# Patient Record
Sex: Female | Born: 1945 | Race: Black or African American | Hispanic: No | Smoking: Former smoker
Health system: Southern US, Community
[De-identification: ages and names within clinical notes are randomized; demographics above are authoritative.]

## PROBLEM LIST (undated history)

## (undated) DIAGNOSIS — T82868A Thrombosis of vascular prosthetic devices, implants and grafts, initial encounter: Secondary | ICD-10-CM

## (undated) DIAGNOSIS — M810 Age-related osteoporosis without current pathological fracture: Secondary | ICD-10-CM

## (undated) DIAGNOSIS — I779 Disorder of arteries and arterioles, unspecified: Secondary | ICD-10-CM

## (undated) DIAGNOSIS — F1011 Alcohol abuse, in remission: Secondary | ICD-10-CM

## (undated) DIAGNOSIS — M6740 Ganglion, unspecified site: Secondary | ICD-10-CM

## (undated) DIAGNOSIS — D3000 Benign neoplasm of unspecified kidney: Secondary | ICD-10-CM

## (undated) DIAGNOSIS — J449 Chronic obstructive pulmonary disease, unspecified: Secondary | ICD-10-CM

## (undated) DIAGNOSIS — J302 Other seasonal allergic rhinitis: Secondary | ICD-10-CM

## (undated) DIAGNOSIS — Z205 Contact with and (suspected) exposure to viral hepatitis: Secondary | ICD-10-CM

## (undated) DIAGNOSIS — I872 Venous insufficiency (chronic) (peripheral): Secondary | ICD-10-CM

## (undated) DIAGNOSIS — K5790 Diverticulosis of intestine, part unspecified, without perforation or abscess without bleeding: Secondary | ICD-10-CM

## (undated) DIAGNOSIS — F1290 Cannabis use, unspecified, uncomplicated: Secondary | ICD-10-CM

## (undated) DIAGNOSIS — I509 Heart failure, unspecified: Secondary | ICD-10-CM

## (undated) DIAGNOSIS — Z720 Tobacco use: Secondary | ICD-10-CM

## (undated) DIAGNOSIS — F331 Major depressive disorder, recurrent, moderate: Secondary | ICD-10-CM

## (undated) DIAGNOSIS — K648 Other hemorrhoids: Secondary | ICD-10-CM

## (undated) DIAGNOSIS — E441 Mild protein-calorie malnutrition: Secondary | ICD-10-CM

## (undated) DIAGNOSIS — M62838 Other muscle spasm: Secondary | ICD-10-CM

## (undated) DIAGNOSIS — I10 Essential (primary) hypertension: Secondary | ICD-10-CM

## (undated) HISTORY — DX: Tobacco use: Z72.0

## (undated) HISTORY — DX: Thrombosis due to vascular prosthetic devices, implants and grafts, initial encounter: T82.868A

## (undated) HISTORY — DX: Mild protein-calorie malnutrition: E44.1

## (undated) HISTORY — DX: Diverticulosis of intestine, part unspecified, without perforation or abscess without bleeding: K57.90

## (undated) HISTORY — DX: Contact with and (suspected) exposure to viral hepatitis: Z20.5

## (undated) HISTORY — DX: Alcohol abuse, in remission: F10.11

## (undated) HISTORY — DX: Benign neoplasm of unspecified kidney: D30.00

## (undated) HISTORY — DX: Disorder of arteries and arterioles, unspecified: I77.9

## (undated) HISTORY — DX: Ganglion, unspecified site: M67.40

## (undated) HISTORY — DX: Venous insufficiency (chronic) (peripheral): I87.2

## (undated) HISTORY — DX: Chronic obstructive pulmonary disease, unspecified: J44.9

## (undated) HISTORY — DX: Other seasonal allergic rhinitis: J30.2

## (undated) HISTORY — DX: Other hemorrhoids: K64.8

## (undated) HISTORY — DX: Major depressive disorder, recurrent, moderate: F33.1

## (undated) HISTORY — DX: Other muscle spasm: M62.838

## (undated) HISTORY — DX: Cannabis use, unspecified, uncomplicated: F12.90

## (undated) HISTORY — DX: Essential (primary) hypertension: I10

## (undated) HISTORY — DX: Age-related osteoporosis without current pathological fracture: M81.0

## (undated) HISTORY — PX: ABDOMINAL HYSTERECTOMY: SHX81

---

## 1998-11-27 ENCOUNTER — Encounter: Admission: RE | Admit: 1998-11-27 | Discharge: 1998-11-27 | Payer: Self-pay | Admitting: Family Medicine

## 1998-11-27 ENCOUNTER — Ambulatory Visit (HOSPITAL_COMMUNITY): Admission: RE | Admit: 1998-11-27 | Discharge: 1998-11-27 | Payer: Self-pay | Admitting: Family Medicine

## 1999-04-14 ENCOUNTER — Inpatient Hospital Stay (HOSPITAL_COMMUNITY): Admission: EM | Admit: 1999-04-14 | Discharge: 1999-04-22 | Payer: Self-pay | Admitting: Emergency Medicine

## 1999-04-14 ENCOUNTER — Encounter: Payer: Self-pay | Admitting: Emergency Medicine

## 1999-04-15 ENCOUNTER — Encounter: Payer: Self-pay | Admitting: General Surgery

## 1999-04-16 ENCOUNTER — Encounter: Payer: Self-pay | Admitting: General Surgery

## 1999-04-17 ENCOUNTER — Encounter: Payer: Self-pay | Admitting: Emergency Medicine

## 1999-04-17 ENCOUNTER — Encounter: Payer: Self-pay | Admitting: General Surgery

## 1999-04-21 ENCOUNTER — Encounter: Payer: Self-pay | Admitting: General Surgery

## 1999-05-21 ENCOUNTER — Encounter: Admission: RE | Admit: 1999-05-21 | Discharge: 1999-05-21 | Payer: Self-pay | Admitting: Family Medicine

## 2001-02-25 ENCOUNTER — Emergency Department (HOSPITAL_COMMUNITY): Admission: EM | Admit: 2001-02-25 | Discharge: 2001-02-25 | Payer: Self-pay | Admitting: *Deleted

## 2002-09-07 ENCOUNTER — Encounter: Admission: RE | Admit: 2002-09-07 | Discharge: 2002-09-07 | Payer: Self-pay | Admitting: Internal Medicine

## 2002-09-14 ENCOUNTER — Encounter: Admission: RE | Admit: 2002-09-14 | Discharge: 2002-09-14 | Payer: Self-pay | Admitting: Internal Medicine

## 2002-10-16 ENCOUNTER — Encounter: Admission: RE | Admit: 2002-10-16 | Discharge: 2002-10-16 | Payer: Self-pay | Admitting: Internal Medicine

## 2003-02-12 ENCOUNTER — Encounter: Admission: RE | Admit: 2003-02-12 | Discharge: 2003-02-12 | Payer: Self-pay | Admitting: Internal Medicine

## 2003-12-02 ENCOUNTER — Emergency Department (HOSPITAL_COMMUNITY): Admission: EM | Admit: 2003-12-02 | Discharge: 2003-12-02 | Payer: Self-pay | Admitting: Emergency Medicine

## 2003-12-13 ENCOUNTER — Encounter: Admission: RE | Admit: 2003-12-13 | Discharge: 2003-12-13 | Payer: Self-pay | Admitting: Internal Medicine

## 2003-12-20 ENCOUNTER — Encounter: Admission: RE | Admit: 2003-12-20 | Discharge: 2003-12-20 | Payer: Self-pay | Admitting: Internal Medicine

## 2004-06-03 ENCOUNTER — Encounter: Admission: RE | Admit: 2004-06-03 | Discharge: 2004-06-03 | Payer: Self-pay | Admitting: Internal Medicine

## 2004-06-11 ENCOUNTER — Ambulatory Visit (HOSPITAL_COMMUNITY): Admission: RE | Admit: 2004-06-11 | Discharge: 2004-06-11 | Payer: Self-pay | Admitting: Internal Medicine

## 2004-06-11 ENCOUNTER — Encounter: Admission: RE | Admit: 2004-06-11 | Discharge: 2004-06-11 | Payer: Self-pay | Admitting: Internal Medicine

## 2004-06-12 ENCOUNTER — Encounter: Admission: RE | Admit: 2004-06-12 | Discharge: 2004-06-12 | Payer: Self-pay | Admitting: Internal Medicine

## 2004-07-03 ENCOUNTER — Encounter: Admission: RE | Admit: 2004-07-03 | Discharge: 2004-07-03 | Payer: Self-pay | Admitting: Internal Medicine

## 2004-09-23 ENCOUNTER — Ambulatory Visit: Payer: Self-pay | Admitting: Hematology & Oncology

## 2004-11-17 ENCOUNTER — Ambulatory Visit (HOSPITAL_COMMUNITY): Admission: RE | Admit: 2004-11-17 | Discharge: 2004-11-17 | Payer: Self-pay | Admitting: Internal Medicine

## 2004-11-17 ENCOUNTER — Ambulatory Visit: Payer: Self-pay | Admitting: Internal Medicine

## 2004-12-07 ENCOUNTER — Ambulatory Visit: Payer: Self-pay | Admitting: Internal Medicine

## 2005-01-07 ENCOUNTER — Ambulatory Visit: Payer: Self-pay | Admitting: Internal Medicine

## 2005-01-30 ENCOUNTER — Encounter (INDEPENDENT_AMBULATORY_CARE_PROVIDER_SITE_OTHER): Payer: Self-pay | Admitting: *Deleted

## 2005-01-30 ENCOUNTER — Emergency Department (HOSPITAL_COMMUNITY): Admission: EM | Admit: 2005-01-30 | Discharge: 2005-01-30 | Payer: Self-pay | Admitting: Emergency Medicine

## 2005-02-02 ENCOUNTER — Emergency Department (HOSPITAL_COMMUNITY): Admission: EM | Admit: 2005-02-02 | Discharge: 2005-02-02 | Payer: Self-pay | Admitting: Emergency Medicine

## 2005-02-06 ENCOUNTER — Emergency Department (HOSPITAL_COMMUNITY): Admission: EM | Admit: 2005-02-06 | Discharge: 2005-02-06 | Payer: Self-pay | Admitting: Family Medicine

## 2005-02-10 ENCOUNTER — Emergency Department (HOSPITAL_COMMUNITY): Admission: EM | Admit: 2005-02-10 | Discharge: 2005-02-10 | Payer: Self-pay | Admitting: Family Medicine

## 2005-09-10 ENCOUNTER — Ambulatory Visit: Payer: Self-pay | Admitting: Internal Medicine

## 2005-09-17 ENCOUNTER — Ambulatory Visit: Payer: Self-pay | Admitting: Internal Medicine

## 2005-10-27 ENCOUNTER — Ambulatory Visit: Payer: Self-pay | Admitting: Internal Medicine

## 2005-11-01 ENCOUNTER — Ambulatory Visit (HOSPITAL_COMMUNITY): Admission: RE | Admit: 2005-11-01 | Discharge: 2005-11-01 | Payer: Self-pay | Admitting: *Deleted

## 2005-11-02 ENCOUNTER — Encounter: Admission: RE | Admit: 2005-11-02 | Discharge: 2005-12-03 | Payer: Self-pay | Admitting: *Deleted

## 2006-09-28 DIAGNOSIS — F331 Major depressive disorder, recurrent, moderate: Secondary | ICD-10-CM | POA: Insufficient documentation

## 2006-09-28 DIAGNOSIS — Z720 Tobacco use: Secondary | ICD-10-CM

## 2006-09-28 DIAGNOSIS — I10 Essential (primary) hypertension: Secondary | ICD-10-CM

## 2006-09-28 HISTORY — DX: Major depressive disorder, recurrent, moderate: F33.1

## 2006-09-28 HISTORY — DX: Tobacco use: Z72.0

## 2006-09-28 HISTORY — DX: Essential (primary) hypertension: I10

## 2006-10-15 DIAGNOSIS — M6740 Ganglion, unspecified site: Secondary | ICD-10-CM

## 2006-10-15 HISTORY — DX: Ganglion, unspecified site: M67.40

## 2006-10-26 ENCOUNTER — Encounter (INDEPENDENT_AMBULATORY_CARE_PROVIDER_SITE_OTHER): Payer: Self-pay | Admitting: *Deleted

## 2006-10-26 ENCOUNTER — Ambulatory Visit: Payer: Self-pay | Admitting: *Deleted

## 2006-10-26 LAB — CONVERTED CEMR LAB
ALT: <8 units/L (ref 0–35)
AST: 13 units/L (ref 0–37)
Albumin: 4 g/dL (ref 3.5–5.2)
CO2: 31 meq/L (ref 19–32)
Calcium: 9 mg/dL (ref 8.4–10.5)
Chloride: 101 meq/L (ref 96–112)
Cholesterol: 174 mg/dL (ref 0–200)
Potassium: 4.3 meq/L (ref 3.5–5.3)
Sodium: 137 meq/L (ref 135–145)
Total CHOL/HDL Ratio: 3.1
Total Protein: 7.1 g/dL (ref 6.0–8.3)

## 2007-01-16 ENCOUNTER — Telehealth: Payer: Self-pay | Admitting: *Deleted

## 2007-02-28 ENCOUNTER — Telehealth (INDEPENDENT_AMBULATORY_CARE_PROVIDER_SITE_OTHER): Payer: Self-pay | Admitting: *Deleted

## 2007-08-03 ENCOUNTER — Emergency Department (HOSPITAL_COMMUNITY): Admission: EM | Admit: 2007-08-03 | Discharge: 2007-08-03 | Payer: Self-pay | Admitting: Emergency Medicine

## 2007-09-12 ENCOUNTER — Ambulatory Visit: Payer: Self-pay | Admitting: Internal Medicine

## 2007-09-12 ENCOUNTER — Encounter (INDEPENDENT_AMBULATORY_CARE_PROVIDER_SITE_OTHER): Payer: Self-pay | Admitting: *Deleted

## 2007-09-12 LAB — CONVERTED CEMR LAB
Bilirubin Urine: NEGATIVE
Creatinine, Urine: 231.7 mg/dL
Microalb Creat Ratio: 4.6 mg/g (ref 0.0–30.0)
Microalb, Ur: 1.06 mg/dL (ref 0.00–1.89)
Nitrite: NEGATIVE
Protein, ur: NEGATIVE mg/dL
Specific Gravity, Urine: 1.025 (ref 1.005–1.03)
Urobilinogen, UA: 1 (ref 0.0–1.0)

## 2007-09-15 ENCOUNTER — Ambulatory Visit (HOSPITAL_COMMUNITY): Admission: RE | Admit: 2007-09-15 | Discharge: 2007-09-15 | Payer: Self-pay | Admitting: *Deleted

## 2007-10-17 ENCOUNTER — Ambulatory Visit (HOSPITAL_COMMUNITY): Admission: RE | Admit: 2007-10-17 | Discharge: 2007-10-17 | Payer: Self-pay | Admitting: *Deleted

## 2007-10-25 ENCOUNTER — Emergency Department (HOSPITAL_COMMUNITY): Admission: EM | Admit: 2007-10-25 | Discharge: 2007-10-25 | Payer: Self-pay | Admitting: Emergency Medicine

## 2007-10-25 ENCOUNTER — Encounter: Admission: RE | Admit: 2007-10-25 | Discharge: 2007-10-25 | Payer: Self-pay | Admitting: *Deleted

## 2008-01-24 ENCOUNTER — Emergency Department (HOSPITAL_COMMUNITY): Admission: EM | Admit: 2008-01-24 | Discharge: 2008-01-24 | Payer: Self-pay | Admitting: Emergency Medicine

## 2008-06-22 ENCOUNTER — Emergency Department (HOSPITAL_COMMUNITY): Admission: EM | Admit: 2008-06-22 | Discharge: 2008-06-22 | Payer: Self-pay | Admitting: Emergency Medicine

## 2008-07-12 ENCOUNTER — Ambulatory Visit: Payer: Self-pay | Admitting: Internal Medicine

## 2008-07-15 ENCOUNTER — Ambulatory Visit (HOSPITAL_COMMUNITY): Admission: RE | Admit: 2008-07-15 | Discharge: 2008-07-15 | Payer: Self-pay | Admitting: *Deleted

## 2008-07-15 DIAGNOSIS — J449 Chronic obstructive pulmonary disease, unspecified: Secondary | ICD-10-CM

## 2008-07-15 HISTORY — DX: Chronic obstructive pulmonary disease, unspecified: J44.9

## 2008-07-24 ENCOUNTER — Encounter: Payer: Self-pay | Admitting: Internal Medicine

## 2008-08-06 ENCOUNTER — Telehealth (INDEPENDENT_AMBULATORY_CARE_PROVIDER_SITE_OTHER): Payer: Self-pay | Admitting: Internal Medicine

## 2009-04-30 ENCOUNTER — Emergency Department (HOSPITAL_COMMUNITY): Admission: EM | Admit: 2009-04-30 | Discharge: 2009-04-30 | Payer: Self-pay | Admitting: Emergency Medicine

## 2009-07-17 ENCOUNTER — Ambulatory Visit: Payer: Self-pay | Admitting: Internal Medicine

## 2009-07-17 DIAGNOSIS — R05 Cough: Secondary | ICD-10-CM

## 2009-07-17 DIAGNOSIS — R059 Cough, unspecified: Secondary | ICD-10-CM | POA: Insufficient documentation

## 2009-07-25 ENCOUNTER — Encounter: Payer: Self-pay | Admitting: Internal Medicine

## 2009-07-25 ENCOUNTER — Ambulatory Visit: Payer: Self-pay | Admitting: Infectious Diseases

## 2009-07-25 LAB — CONVERTED CEMR LAB
ALT: 9 units/L (ref 0–35)
Albumin: 4.3 g/dL (ref 3.5–5.2)
Alkaline Phosphatase: 61 units/L (ref 39–117)
CO2: 25 meq/L (ref 19–32)
Cholesterol: 195 mg/dL (ref 0–200)
LDL Cholesterol: 107 mg/dL — ABNORMAL HIGH (ref 0–99)
MCV: 90.9 fL (ref >=78.0)
Platelets: 228 10*3/uL (ref 150–400)
Potassium: 4.5 meq/L (ref 3.5–5.3)
Sodium: 140 meq/L (ref 135–145)
Total Bilirubin: 0.2 mg/dL — ABNORMAL LOW (ref 0.3–1.2)
Total Protein: 7.2 g/dL (ref 6.0–8.3)
VLDL: 28 mg/dL (ref 0–40)
WBC: 4.6 10*3/uL (ref 4.0–10.5)

## 2009-08-11 ENCOUNTER — Telehealth: Payer: Self-pay | Admitting: Internal Medicine

## 2009-09-23 ENCOUNTER — Telehealth: Payer: Self-pay | Admitting: Internal Medicine

## 2009-11-13 ENCOUNTER — Ambulatory Visit (HOSPITAL_COMMUNITY): Admission: RE | Admit: 2009-11-13 | Discharge: 2009-11-13 | Payer: Self-pay | Admitting: Internal Medicine

## 2010-08-15 ENCOUNTER — Emergency Department (HOSPITAL_COMMUNITY): Admission: EM | Admit: 2010-08-15 | Discharge: 2010-08-15 | Payer: Self-pay | Admitting: Emergency Medicine

## 2010-08-20 ENCOUNTER — Ambulatory Visit: Payer: Self-pay | Admitting: Internal Medicine

## 2010-09-18 ENCOUNTER — Telehealth: Payer: Self-pay | Admitting: Internal Medicine

## 2010-10-21 ENCOUNTER — Telehealth: Payer: Self-pay | Admitting: Internal Medicine

## 2010-10-28 ENCOUNTER — Telehealth: Payer: Self-pay | Admitting: Internal Medicine

## 2010-11-17 ENCOUNTER — Ambulatory Visit: Admit: 2010-11-17 | Payer: Self-pay

## 2010-11-19 ENCOUNTER — Ambulatory Visit (HOSPITAL_COMMUNITY)
Admission: RE | Admit: 2010-11-19 | Discharge: 2010-11-19 | Payer: Self-pay | Source: Home / Self Care | Attending: Internal Medicine | Admitting: Internal Medicine

## 2010-11-19 LAB — HM MAMMOGRAPHY: HM Mammogram: NEGATIVE

## 2010-12-07 ENCOUNTER — Observation Stay (HOSPITAL_COMMUNITY)
Admission: EM | Admit: 2010-12-07 | Discharge: 2010-12-13 | Payer: Self-pay | Source: Home / Self Care | Attending: Vascular Surgery | Admitting: Vascular Surgery

## 2010-12-08 LAB — POCT I-STAT, CHEM 8
BUN: 19 mg/dL (ref 6–23)
Creatinine, Ser: 1 mg/dL (ref 0.4–1.2)
Potassium: 3.4 mEq/L — ABNORMAL LOW (ref 3.5–5.1)
Sodium: 138 mEq/L (ref 135–145)

## 2010-12-08 LAB — CBC
Hemoglobin: 12.9 g/dL (ref 12.0–15.0)
MCH: 27.9 pg (ref 26.0–34.0)
RBC: 4.63 MIL/uL (ref 3.87–5.11)

## 2010-12-09 HISTORY — PX: FEMORAL ARTERY - POPLITEAL ARTERY BYPASS GRAFT: SUR180

## 2010-12-09 LAB — CBC
Platelets: 236 10*3/uL (ref 150–400)
RBC: 4.28 MIL/uL (ref 3.87–5.11)
RDW: 13.5 % (ref 11.5–15.5)
WBC: 3.8 10*3/uL — ABNORMAL LOW (ref 4.0–10.5)

## 2010-12-09 LAB — BASIC METABOLIC PANEL
BUN: 5 mg/dL — ABNORMAL LOW (ref 6–23)
Chloride: 100 mEq/L (ref 96–112)
Creatinine, Ser: 0.9 mg/dL (ref 0.4–1.2)
GFR calc Af Amer: >60 mL/min (ref >60)
GFR calc non Af Amer: >60 mL/min (ref >60)
Potassium: 3.8 mEq/L (ref 3.5–5.1)

## 2010-12-09 LAB — MRSA PCR SCREENING: MRSA by PCR: NEGATIVE

## 2010-12-10 LAB — BASIC METABOLIC PANEL
Calcium: 8.7 mg/dL (ref 8.4–10.5)
GFR calc Af Amer: >60 mL/min (ref >60)
GFR calc non Af Amer: >60 mL/min (ref >60)
Glucose, Bld: 126 mg/dL — ABNORMAL HIGH (ref 70–99)
Potassium: 3.8 mEq/L (ref 3.5–5.1)
Sodium: 139 mEq/L (ref 135–145)

## 2010-12-10 LAB — CBC
HCT: 32.5 % — ABNORMAL LOW (ref 36.0–46.0)
Hemoglobin: 10.6 g/dL — ABNORMAL LOW (ref 12.0–15.0)
MCHC: 32.6 g/dL (ref 30.0–36.0)
RDW: 13.1 % (ref 11.5–15.5)
WBC: 8.2 10*3/uL (ref 4.0–10.5)

## 2010-12-11 NOTE — H&P (Signed)
  NAMEDEZTINEE, LOHMEYER NO.:  [id number]  MEDICAL RECORD NO.:  [id number]          PATIENT TYPE:  OBV  LOCATION:  2031                         FACILITY:  [facility]  PHYSICIAN:  Larina Earthly, M.D.    DATE OF BIRTH:  Mar 17, 1946  DATE OF ADMISSION:  12/07/2010 DATE OF DISCHARGE:                             HISTORY & PHYSICAL   ADMISSION DIAGNOSIS:  Rest ischemia of left foot.  HISTORY OF PRESENT ILLNESS:  Kristin Knight is a 65 year old black female who reports a 3-week history of progressive worsening rest pain in her left foot.  On questioning, she did have a long history of left calf claudication and that was moderate to severe, but over the past 3 weeks he has had progressive ischemia and now presents to the emergency department on this day due to intolerable pain.  She does not have any prior history of cardiac disease.  No other history of peripheral vascular occlusive disease.  No history of stroke.  Her past medicalhistory is significant for hypertension and depression for which she is on treatment.  She has allergies to DEMEROL and PENICILLIN, which cause syncope.  MEDICATIONS ON ADMISSION: 1. Lisinopril 10 mg daily. 2. Nortriptyline 50 mg at bedtime. 3. Hydrochlorothiazide and triamterene 37.5/25 one daily.  SOCIAL HISTORY:  Patient is a current cigarette smoker.  Does not drink alcohol.  FAMILY HISTORY:  Significant for coronary artery disease.  PAST SURGICAL HISTORY:  None.  PHYSICAL EXAM:  GENERAL:  A well-developed, well-nourished black female, appearing stated age, in no acute distress.  She is in no acute distress. VITAL SIGNS:  Blood pressure is 138/95, heart rate 81, respirations 18, temperature is 97.5. HEENT:  Normal. CHEST:  Clear bilaterally without rales, rhonchi, or wheezes. HEART:  Regular rate and rhythm without murmur. ABDOMEN:  Soft, nontender.  No masses noted. EXTREMITIES:  Without cyanosis or edema or any deformities. SKIN:   Without ulcers or rashes.  Pulse status is 2+ radial, 2+ femoral. She has a 2+ right popliteal and 1+ right posterior tibial.  She has absent left popliteal and left distal pulses.  Review of systems is otherwise negative except for HPI.  She underwent noninvasive vascular laboratory studies revealing an ankle arm index of 0.34 on the left and normal at 1.0 on the right.  Recommended that she undergo arteriography for further evaluation of her ischemia. Neurologic is noted for motor and sensory attack in the lower extremities and no other focal weakness.  She did undergo arteriogram and this did reveal occlusion of her superficial femoral artery reconstitution above the knee popliteal artery.  Discussed this with Ms. Goshorn explained that due to her rest pain, she will require femoral to popliteal bypass for relief of this. She will be admitted today for a vein map on tomorrow Tuesday, January 24 and tentatively plan for surgery on January 25 with Dr. Leonides Sake.     Larina Earthly, M.D.     TFE/MEDQ  D:  12/07/2010  T:  12/08/2010  Job:  [id number]  Electronically Signed by Jewett Mcgann M.D. on 12/11/2010 03:37:34 PM

## 2010-12-12 LAB — CBC
Platelets: 175 10*3/uL (ref 150–400)
RBC: 3.68 MIL/uL — ABNORMAL LOW (ref 3.87–5.11)
WBC: 5.8 10*3/uL (ref 4.0–10.5)

## 2010-12-13 LAB — CROSSMATCH
ABO/RH(D): B POS
Antibody Screen: NEGATIVE
Unit division: 0

## 2010-12-13 NOTE — Op Note (Addendum)
NAME:  Kristin Knight, MASK NO.:  [id number]  MEDICAL RECORD NO.:  [id number]           PATIENT TYPE:  LOCATION:                                 FACILITY:  PHYSICIAN:  Fransisco Hertz, MD       DATE OF BIRTH:  20-Mar-1946  DATE OF PROCEDURE:  12/09/2010 DATE OF DISCHARGE:                              OPERATIVE REPORT   PROCEDURE:  Left common femoral artery to above-knee popliteal bypass with a Propaten graft.  PREOPERATIVE DIAGNOSIS:  Left leg rest pain.  POSTOPERATIVE DIAGNOSIS:  Left leg rest pain.  SURGEON:  Arlys John L. Imogene Burn, MD  ANESTHESIA:  General endotracheal.  ESTIMATED BLOOD LOSS:  200 mL.  FLUIDS:  Crystalloid 2500 mL.  URINE OUTPUT:  700 mL.  FINDINGS: included a dopplerable left posterior tibial at the end of the case.  SPECIMENS: none  INDICATIONS:  This is a 65 year old female that presented to ER with about a 3-week history of rest pain.  She was brought back to the angio suite and a diagnostic angiogram demonstrated a left superficial femoral artery occlusion.  Her distal vessels were patent and too small to be used as a bypass target.  Additionally, the popliteal artery while small appeared to be patent.  Based on the patient's findings of rest pain, it was felt that she would need a bypass to improve her blood flow with an ABI less than 0.4.  She underwent vein mapping which failed to demonstrate a venous conduit large enough for use as a bypass conduit, so the plan was to proceed forward with a bypass from the common femoral artery on the left side down to the above-knee popliteal segment.  I discussed with the patient the risks, benefits, and alternatives of this operation and specific risk we discussed included but were not limited to bleeding, infection, possible nerve damage, possible myocardial infarction, possible stroke, possible need for additional procedures.  DESCRIPTION OF THE OPERATION:  After full informed written consent  was obtained from the patient, she was brought back to the operating room, placed supine upon the operating table.  Prior to induction, she had received IV antibiotics.  After obtaining adequate anesthesia, she was then prepped and draped in standard fashion for a left leg bypass procedure.  I had turned my attention first to her left groin.  There was a palpable femoral pulse.  I made a longitudinal incision over this artery and then using blunt dissection and electrocautery developed a plane down to the artery.  This was dissected out and I was able to dissect out the proximal superficial femoral artery up to the level of the inguinal ligament and dissected distally finding the superficial femoral artery and profunda arteries.  All arteries were controlled with vessel loops. I then removed retraction and packed this wound with wet Ray-Tec and turned my attention to the distal thigh medially.  I made an incision over Hunter canal using blunt dissection and electrocautery.  I was able to dissect down through this fascia, eventually I was able to dissect out the distal superficial femoral artery.  I took care to avoid  injury to nerves, one crossing a segment in the distal aspect of the superficial femoral artery had to be tied off and transected as it was over the target portion of the superficial femoral artery for the bypass.  Other than this, we were able to successfully mobilize the superficial femoral artery without any injury to adjacent structures.  The proximal aspect of this distal superficial femoral artery was extremely calcified and I did not feel a pulse; however, distally at roughly the level where the superficial femoral artery went underneath the femur there was a palpable pulse at this level and then I verified this with continuous Doppler.  There was a monophasic signal within this artery.  This was dissected out, and I placed vessel loops around this segment of  the artery.  I also at this point using a metal tunneler dissected a subsartorial tunnel up to the groin and then via this metal tunnel.  I placed a 6-mm Propaten graft with external rings, this was delivered through the metal tunnel and then the tunnel was removed.  At this point, I gave the patient a therapeutic bolus of heparin which was a total of 5000 units intravenously.  After waiting 3 minutes, then I turned my attention to the left groin.  I clamped the external iliac artery and the profunda and superficial femoral artery.  I made an arteriotomy in the common femoral artery, extended it with Potts scissor for total length of about a 7-mm arteriotomy.  I then spatulated one end of the graft to meet the dimensions.  This was sewn in with a running stitch of 5-0 Prolene in an end to side configuration.  Prior to completing this anastomosis, I backbled this profunda and the superficial femoral artery and also allowed the common femoral artery to bleed in antegrade fashion.  There was good pulsatile bleeding from all arteries.  I completed the anastomosis in usual fashion, and applied thrombin and Gelfoam to all the needle holes and released the retraction and then turned my attention to the distal incision.  Note that there was good pulsatile bleeding from the graft at this point, so I clamped the graft near its inflow anastomosis and then I pulled this graft to appropriate length. I then sucked out the blood from the graft and instilled it with heparinized saline to fully flush any residual blood out of this graft and then clamped it after injecting some more heparinized saline within the graft.  At this point, I looked at my distal superficial femoral artery and felt that some additional distal dissection was necessary.  I took down some of the fascial attachments of the Hunter canal, and at this point we were in the above-knee popliteal segment and I was able to feel a soft  segment in this above-knee popliteal segment.  At this point, I put the artery under tension using the vessel loops and then made arteriotomy then extended it with Potts scissor to 6 mm.  I allowed the  artery to bleed in antegrade and retrograde fashion.  There was actually good retrograde bleeding and minimal antegrade bleeding.  I flushed out this artery proximally and distally.  At this point then, I stretched the graft to appropriate length again and cut it to appropriate length taking off the external rings as necessary and then spatulated this graft for this anastomosis.  The graft was sewn to the artery using a running stitch of 6-0 Prolene in a end to side configuration.  Prior to  completing this anastomosis,  I allowed the popliteal artery to back bleed from both ends and then I allowed the graft to bleed for about 5 seconds to fully flush out any clot or any air in the graft and then I injected into this anastomosis some heparinized saline.  The anastomosis was completed in usual fashion.  I then released all clamps and vessel loops and then immediately there was a strong pulse in the above-knee popliteal segment.  I applied thrombin and Gelfoam to this anastomosis.  At this point, using continuous Doppler unit, I interrogated the above-knee popliteal artery.  There was a strong multiphasic signal within it and then interrogated distally the posterior tibial.  There was a strong signal in this artery that was stronger than before.  I was not able, however, to get a strong anterior tibial or dorsalis pedis signal.  This is consistent with preoperative findings on the angiogram, so I was not surprised with this.  At this point, I turned my attention to the left groin.  I irrigated it out with normal saline.  There was no more active bleeding at this point.  This groin was repaired with a double layer of 2-0 Vicryl and then a double layer of 3-0 Vicryl.  The skin was then  reapproximated with running subcuticular 3-0 Monocryl and then the skin was cleaned, dried, and reinforced with Dermabond.  I then turned my attention to the above knee incision, and irrigated it out with normal saline.  There was no more active bleeding.  The muscle was reapproximated over the graft using a running stitch of 2-0 Vicryl and then the subcutaneous tissue was reapproximated with a layer of 3-0 Vicryl, and then the skin was reapproximated with a running subcuticular of 4-0 Monocryl.  The skin was then cleaned, dried, and reinforced with Dermabond.  At this point, the patient was allowed to awaken without any difficulties with the plan to admit to step-down unit overnight.  COMPLICATIONS:  None.  CONDITION:  Stable.     Fransisco Hertz, MD     BLC/MEDQ  D:  12/09/2010  T:  12/10/2010  Job:  [id number]  Electronically Signed by Leonides Sake MD on 12/13/2010 03:50:11 PM

## 2010-12-13 NOTE — Op Note (Addendum)
NAME:  CHEZNEY, HUETHER NO.:  [id number]  MEDICAL RECORD NO.:  [id number]          PATIENT TYPE:  OBV  LOCATION:  2031                         FACILITY:  [facility]  PHYSICIAN:  Fransisco Hertz, MD       DATE OF BIRTH:  [birth year]  DATE OF PROCEDURE:  12/07/2010 DATE OF DISCHARGE:                              OPERATIVE REPORT   PROCEDURE: 1. Right common femoral artery cannulation under ultrasound guidance. 2. Aortogram. 3. Bilateral leg runoff.  PREOPERATIVE DIAGNOSIS:  Left rest pain.  POSTOP DIAGNOSIS:  Left rest pain.  SURGEON:  Fransisco Hertz, MD  Contrast 100 mL.  ANESTHESIA:  Conscious sedation.  FINDINGS: 1. Patent aorta. 2. Patent bilateral renal artery with nephrograms evident. 3. Patent bilateral common external and internal iliac arteries both     internal iliac arteries are diseased distally. 4. Bilateral common femoral artery and profunda arteries are widely     patent. 5. Right superficial femoral artery has multiple stenoses that are     less than 25%. 6. Left superficial femoral artery is occluded in the mid segment with     reconstitution of distal SFA which is about a little bit over 3 mm     in diameter. 7. Patent bilateral popliteal trifurcation with patent anterior tibial and     posterior tibial artery. 8. Bilateral peroneal arteries are not visualized about mid lower leg     level. 9. Bilateral feet have runoff via posterior tibial, anterior tibial     arteries. 10.All of the tibial arteries visualized were less than 2 mm in     diameter.  INDICATION:  This is a 65 year old female who presents with left leg rest pain for about 3 weeks now.  It was felt that due to the nature of her symptomatology immediate interrogation with aortogram and bilateral leg runoff would be necessary without intervening period of conservative management.  She is aware of the risks of this procedure which included access, complications, bleeding,  infection, possible embolization and possible need for emergent surgical intervention.  DESCRIPTION OF OPERATION:  After full informed written consent was obtained from the patient, the patient was brought back to the angio suite, placed supine upon the angio table.  She was connected to monitoring equipment and given conscious sedation, amounts of which are listed in the chart.  She was then prepped and draped in a standard fashion for aortogram bilateral leg runoff.  I turned my attention first to a right groin, ultrasound guidance interrogated the common femoral artery, it was noted to be widely patent and I cannulated this with an 18 gauge needle and passed Bentson wire up into the aorta.  The needle was exchanged for 5-French sheath and a dilator was removed, and then a pigtail catheter was loaded over the wire and advanced to the level of L1.  The wire was removed.  The catheter was connected to the power injector circuit to perform de-airing and declotting maneuver.  The power injector aortic findings are as listed above.  At this point, I pulled the catheter down to just proximal to the aortic  bifurcation and automated bilateral leg runoff was completed, the findings of which are listed above.  Based on this, I did not think that it would be safe to proceed with a subintimal angioplasty of the target reentry was a vessel only about 3 mm also on the entry site.  Also there was a side branch that I felt was going to compromise my ability to get into the subintimal space, so I think this patient is better served with a common femoral artery to above-knee bypass with preferably vein.  Unfortunately, this patient's tibial vessels were so small that if in the future if she develops any disease in these vessels, she unfortunately will not be a candidate for femoral to distal bypass, so at this point per the patient wish we are going to admit her to the hospital continue her preoperative  workup and then schedule her for a possible left common femoral artery to above- knee bypass.  COMPLICATIONS:  None.  CONDITION:  Stable.     Fransisco Hertz, MD     BLC/MEDQ  D:  12/07/2010  T:  12/08/2010  Job:  [id number]  Electronically Signed by Leonides Sake MD on 12/13/2010 03:44:03 PM

## 2010-12-15 NOTE — Progress Notes (Signed)
Summary: refill/gg  Phone Note Refill Request  on October 21, 2010 3:11 PM  Refills Requested: Medication #1:  TRIAMTERENE-HCTZ 37.5-25 MG TABS take one tablet by mouth daily.   Last Refilled: 09/09/2010  Method Requested: Electronic Initial call taken by: Merrie Roof RN,  October 21, 2010 3:11 PM    Prescriptions: TRIAMTERENE-HCTZ 37.5-25 MG TABS (TRIAMTERENE-HCTZ) take one tablet by mouth daily.  #31 x 6   Entered and Authorized by:   Laren Everts MD   Signed by:   Laren Everts MD on 10/22/2010   Method used:   Electronically to        [facility] [phone] (retail)       [address]       [place], [place]  [address]       Ph: [phone]       Fax: [phone]   RxID:   [id number]

## 2010-12-15 NOTE — Progress Notes (Signed)
Summary: Refill/gh  Phone Note Refill Request Message from:  Fax from Pharmacy on September 18, 2010 2:28 PM  Refills Requested: Medication #1:  LISINOPRIL 10 MG TABS take one tablet by mouth daily.   Last Refilled: 07/10/2010  Method Requested: Electronic Initial call taken by: Angelina Ok RN,  September 18, 2010 2:28 PM    Prescriptions: LISINOPRIL 10 MG TABS (LISINOPRIL) take one tablet by mouth daily.  #31 x 11   Entered and Authorized by:   Laren Everts MD   Signed by:   Laren Everts MD on 09/19/2010   Method used:   Electronically to        [facility] [phone] (retail)       [address]       [place], [place]  [address]       Ph: [phone]       Fax: [phone]   RxID:   [id number]
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
## 2010-12-16 ENCOUNTER — Emergency Department (HOSPITAL_COMMUNITY)
Admission: EM | Admit: 2010-12-16 | Discharge: 2010-12-16 | Disposition: A | Discharge status: Home / Self Care | Payer: Self-pay | Attending: Emergency Medicine | Admitting: Emergency Medicine

## 2010-12-16 DIAGNOSIS — Z711 Person with feared health complaint in whom no diagnosis is made: Secondary | ICD-10-CM | POA: Insufficient documentation

## 2010-12-17 NOTE — Progress Notes (Signed)
Summary: refill/ hla  Phone Note Refill Request Message from:  Patient on October 28, 2010 10:36 AM  Refills Requested: Medication #1:  REMERON 15 MG TABS Take 1/2 tablet by mouth once a day for one week   Dosage confirmed as above?Dosage Confirmed Initial call taken by: Marin Roberts RN,  October 28, 2010 10:36 AM    Prescriptions: REMERON 15 MG TABS (MIRTAZAPINE) Take 1/2 tablet by mouth once a day for one week, then Take 1 tablet by mouth once a day  #30 x 1   Entered and Authorized by:   Laren Everts MD   Signed by:   Laren Everts MD on 10/28/2010   Method used:   Electronically to        [facility] [phone]* (retail)       [address]       [place], [place]  [address]       Ph: [phone]       Fax: [phone]   RxID:   [id number]

## 2011-01-01 ENCOUNTER — Ambulatory Visit (INDEPENDENT_AMBULATORY_CARE_PROVIDER_SITE_OTHER): Payer: Self-pay | Admitting: Vascular Surgery

## 2011-01-01 DIAGNOSIS — I70229 Atherosclerosis of native arteries of extremities with rest pain, unspecified extremity: Secondary | ICD-10-CM

## 2011-01-01 NOTE — Assessment & Plan Note (Signed)
OFFICE VISIT  Kristin Knight, Kristin Knight DOB:  1946/10/22                                       01/01/2011 ZOXWR#:[id number]  This is a postop followup.  HISTORY OF PRESENT ILLNESS:  This is a 65 year old female that I previously did an angiogram and based on the angiogram proceeded forward with a left common femoral artery to above knee popliteal bypass with Propaten on 12/09/2010.  Since her discharge the patient has had a small amount of separation of the left thigh incision which at this point has completely closed.  She notes some serous drainage.  No fevers or chills.  Her left foot pain is resolved at this point.  PHYSICAL EXAMINATION:  Vital signs:  Today she had a temperature 97.7, blood pressure 142/83, heart rate of 72, respirations 12.  Her left groin incision is healing well with no wound separation.  The left thigh incision demonstrates fullness but the incision appears to be intact. There is no frank drainage.  No erythema.  There is absolutely no tenderness to light touch.  There is some ballotable fluid at this location.  Distally the foot demonstrates no ischemic changes.  There is a weakly palpable left posterior tibial pulse.  Otherwise I do not appreciate a DP or peroneal pulse on examination.  MEDICAL DECISION MAKING:  This is a 65 year old female with status post a left common femoral artery to above knee popliteal bypass with Propaten graft.  Currently there is no evidence of infection.  I suspect some degree of seroma collection in this patient's left above the knee popliteal exposure, which is not surprising given the depth of the artery. I had to dissect out the artery within the Hunter's canal in order to get a segment of artery that was patent.  I would keep a low index of suspicion for infection given that this patient does not have a vein conduit as alternative for this bypass, so she needs to keep this graft patent as long as  possible.  I gave her strict wound care instructions.  She is going to keep the wounds clean with soap and water, apply some antibiotic ointment and then keep it bandaged.  She is going to follow up in 2 weeks.  At that point also will do a repeat ABI and also a duplex of the graft to make certain everything is widely patent and also interrogate for any residual fluid.  If at any point she develops any signs of infection, the signs of which I discussed with her, she is going to call us immediately and we will reevaluate the patient.    Fransisco Hertz, MD Electronically Signed  BLC/MEDQ  D:  01/01/2011  T:  01/01/2011  Job:  [id number]

## 2011-01-14 ENCOUNTER — Encounter: Payer: Self-pay | Admitting: Internal Medicine

## 2011-01-14 ENCOUNTER — Ambulatory Visit (INDEPENDENT_AMBULATORY_CARE_PROVIDER_SITE_OTHER): Payer: Self-pay | Admitting: Internal Medicine

## 2011-01-14 DIAGNOSIS — D649 Anemia, unspecified: Secondary | ICD-10-CM | POA: Insufficient documentation

## 2011-01-14 DIAGNOSIS — F329 Major depressive disorder, single episode, unspecified: Secondary | ICD-10-CM

## 2011-01-14 DIAGNOSIS — I10 Essential (primary) hypertension: Secondary | ICD-10-CM

## 2011-01-14 DIAGNOSIS — Z1211 Encounter for screening for malignant neoplasm of colon: Secondary | ICD-10-CM

## 2011-01-14 DIAGNOSIS — R634 Abnormal weight loss: Secondary | ICD-10-CM

## 2011-01-14 DIAGNOSIS — J449 Chronic obstructive pulmonary disease, unspecified: Secondary | ICD-10-CM

## 2011-01-14 LAB — FERRITIN: Ferritin: 19 ng/mL (ref 10–291)

## 2011-01-14 MED ORDER — MIRTAZAPINE 15 MG PO TABS: 15.0000 mg | ORAL_TABLET | Freq: Every day | ORAL | Status: DC

## 2011-01-14 NOTE — Assessment & Plan Note (Signed)
Stable. Unknown etiology. Will check Anemia panel.

## 2011-01-14 NOTE — Assessment & Plan Note (Signed)
At goal. Continue current regimen. 

## 2011-01-14 NOTE — Assessment & Plan Note (Addendum)
Stable. Denies suicidal/homicidal ideation. Restart Remeron. Follow up in one month.

## 2011-01-14 NOTE — Patient Instructions (Signed)
Please make an appointment for 1 month. Restart taking Remeron as directed. Continue taking all other medications.

## 2011-01-14 NOTE — Assessment & Plan Note (Signed)
Will have her talk to representative for Colon Cancer screening program as patient does not have orange card or medical insurance.
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
## 2011-01-14 NOTE — Assessment & Plan Note (Signed)
Stable

## 2011-01-14 NOTE — Assessment & Plan Note (Addendum)
May be 2/2 to Thyroid disease, depression. Patient had hysterectomy so pap smear not indicated, Mammogram on 11/2010 showed no evidence of malignancy. Patient has never had a Colonoscopy. Will schedule to rule out colon cancer as etiology of weight loss.  Review labs and follow up.

## 2011-01-15 ENCOUNTER — Ambulatory Visit (INDEPENDENT_AMBULATORY_CARE_PROVIDER_SITE_OTHER): Payer: Self-pay | Admitting: Vascular Surgery

## 2011-01-15 ENCOUNTER — Encounter (INDEPENDENT_AMBULATORY_CARE_PROVIDER_SITE_OTHER): Payer: Self-pay

## 2011-01-15 DIAGNOSIS — I739 Peripheral vascular disease, unspecified: Secondary | ICD-10-CM

## 2011-01-15 DIAGNOSIS — I70219 Atherosclerosis of native arteries of extremities with intermittent claudication, unspecified extremity: Secondary | ICD-10-CM

## 2011-01-15 DIAGNOSIS — Z48812 Encounter for surgical aftercare following surgery on the circulatory system: Secondary | ICD-10-CM

## 2011-01-15 LAB — COMPREHENSIVE METABOLIC PANEL
ALT: <8 U/L (ref 0–35)
AST: 14 U/L (ref 0–37)
Albumin: 4.2 g/dL (ref 3.5–5.2)
Calcium: 9.5 mg/dL (ref 8.4–10.5)
Chloride: 100 mEq/L (ref 96–112)
Potassium: 3.5 mEq/L (ref 3.5–5.3)
Sodium: 140 mEq/L (ref 135–145)
Total Protein: 7.3 g/dL (ref 6.0–8.3)

## 2011-01-15 LAB — LIPID PANEL
Cholesterol: 206 mg/dL — ABNORMAL HIGH (ref 0–200)
HDL: 61 mg/dL (ref >39)
Triglycerides: 156 mg/dL — ABNORMAL HIGH (ref <150)

## 2011-01-15 LAB — IRON AND TIBC
%SAT: 24 % (ref 20–55)
Iron: 92 ug/dL (ref 42–145)

## 2011-01-18 NOTE — Assessment & Plan Note (Signed)
OFFICE VISIT  Kristin, Knight DOB:  1946-04-04                                       01/15/2011 ZOXWR#:[id number]  This is a postop followup.  HISTORY OF PRESENT ILLNESS:  This is a 65 year old female that has previously undergone a left common femoral artery to above knee popliteal bypass with Propaten graft that was completed on 12/09/2010. She was seen recently and had like some minor superficial wound separation with some serous drainage which I thought was most likely related to a small seroma.  At this point the incision has completely sealed up in the left thigh.  No more drainage and the swelling has gone down.  She denies any fever or chills.  She has complete resolution of all her previous symptomatology and she is able to ambulate without any difficulties.  PHYSICAL EXAMINATION:  She had a blood pressure of 150/74, heart rate of 74, respirations 12, temperature 98.0.  On focused exam of her left leg the groin has healed in completely.  The left thigh incision has also healed.  There is no ballotable fluid at this point and no wound separation.  It is completely healed at this point.  The feet have palpable posterior tibial pulse on the left side.  She had ABIs and a graft duplex completed today.  The ABI on the right side was 1.02, on the left 1.03.  There were biphasic posterior tibial signals and dorsalis pedis bilaterally.  The graft duplex demonstrates a patent graft with triphasic flow throughout the graft.  MEDICAL DECISION MAKING:  This is a 65 year old female who has undergone a successful left common femoral artery to above knee popliteal bypass with Propaten graft.  She has had excellent outcome from that.  We are going to repeat the studies in 3 months.  She is going to be followed in assisted patency surveillance, as this patient has very poor options if this graft fails we will be more aggressive in intervening if  velocities start to escalate in this graft.  I explained to her that if she develops any recurrence of her previous symptomatology, any fevers or chills she needs to immediately call us for followup and at that point will consider intervention as necessary.  At this point will repeat the studies in 3 months as I said and will see her back at that time.    Fransisco Hertz, MD Electronically Signed  BLC/MEDQ  D:  01/15/2011  T:  01/15/2011  Job:  [id number]

## 2011-01-19 NOTE — Procedures (Unsigned)
BYPASS GRAFT EVALUATION  INDICATION:  Followup left bypass graft.  HISTORY: Diabetes:  No. Cardiac:  No. Hypertension:  Yes. Smoking:  Yes. Previous Surgery:  Left common femoral to popliteal above the knee bypass graft with Propaten graft 12/09/2010.  SINGLE LEVEL ARTERIAL EXAM                              RIGHT              LEFT Brachial:                    156                153 Anterior tibial:             139                152 Posterior tibial:            157                161 Peroneal: Ankle/brachial index:        1.01               1.03  PREVIOUS ABI:  Date:  RIGHT:  LEFT:  LOWER EXTREMITY BYPASS GRAFT DUPLEX EXAM:  DUPLEX:  A patent graft with mildly broadened triphasic waveforms.  No evidence of focal stenosis or increased velocities visualized.  IMPRESSION:  Stable ankle brachial indices.  Patent graft as described above.  ___________________________________________ Fransisco Hertz, MD  OD/MEDQ  D:  01/15/2011  T:  01/15/2011  Job:  [id number]

## 2011-01-27 LAB — POCT I-STAT, CHEM 8
BUN: 7 mg/dL (ref 6–23)
Calcium, Ion: 1.19 mmol/L (ref 1.12–1.32)
Chloride: 101 mEq/L (ref 96–112)

## 2011-01-27 LAB — POCT CARDIAC MARKERS: Troponin i, poc: <0.05 ng/mL (ref 0.00–0.09)

## 2011-01-27 LAB — BRAIN NATRIURETIC PEPTIDE: Pro B Natriuretic peptide (BNP): 35 pg/mL (ref 0.0–100.0)

## 2011-02-03 ENCOUNTER — Ambulatory Visit (INDEPENDENT_AMBULATORY_CARE_PROVIDER_SITE_OTHER): Payer: Self-pay | Admitting: Internal Medicine

## 2011-02-03 ENCOUNTER — Encounter: Payer: Self-pay | Admitting: Internal Medicine

## 2011-02-03 DIAGNOSIS — R634 Abnormal weight loss: Secondary | ICD-10-CM

## 2011-02-03 DIAGNOSIS — F329 Major depressive disorder, single episode, unspecified: Secondary | ICD-10-CM

## 2011-02-03 DIAGNOSIS — Z1211 Encounter for screening for malignant neoplasm of colon: Secondary | ICD-10-CM

## 2011-02-03 DIAGNOSIS — R946 Abnormal results of thyroid function studies: Secondary | ICD-10-CM

## 2011-02-03 DIAGNOSIS — D649 Anemia, unspecified: Secondary | ICD-10-CM

## 2011-02-03 LAB — T4, FREE: Free T4: 0.96 ng/dL (ref 0.80–1.80)

## 2011-02-03 MED ORDER — DOCUSATE SODIUM 100 MG PO CAPS: 100.0000 mg | ORAL_CAPSULE | Freq: Two times a day (BID) | ORAL | Status: DC

## 2011-02-03 MED ORDER — FERROUS SULFATE 325 (65 FE) MG PO TABS: 325.0000 mg | ORAL_TABLET | Freq: Three times a day (TID) | ORAL | Status: DC

## 2011-02-03 NOTE — Assessment & Plan Note (Addendum)
TSH low. Will check Free T4 to evaluate for hyperthyroidism. Patient may have subclinical hyperthyroidism. Once Colonoscopy is done. Depending on results will consider ordering DEXA scan to evaluate for osteoporosis.

## 2011-02-03 NOTE — Assessment & Plan Note (Signed)
Patient was given documents to fill for colon cancer study. Will have patient complete hemoccult cards.

## 2011-02-03 NOTE — Patient Instructions (Signed)
Make a follow up appointment in 3 months. Start taking iron supplementation as directed. May cause constipation can use stool softener. Do Hemoccult cards and finish paperwork for Colonoscopy.

## 2011-02-03 NOTE — Assessment & Plan Note (Addendum)
Ferritin at 19 suggestive of iron deficiency anemia. Will start iron supplementation.
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
## 2011-02-03 NOTE — Assessment & Plan Note (Signed)
Stable. Taking remeron as directed.

## 2011-02-03 NOTE — Assessment & Plan Note (Signed)
Patient has gained weight with remeron but still needs full evaluation to rule out colon cancer. Will follow up.

## 2011-02-03 NOTE — Progress Notes (Signed)
Addended by: Alric Quan on: 02/03/2011 11:42 AM   Modules accepted: Orders

## 2011-02-17 ENCOUNTER — Other Ambulatory Visit: Payer: Self-pay | Admitting: *Deleted

## 2011-02-18 MED ORDER — ALBUTEROL SULFATE HFA 108 (90 BASE) MCG/ACT IN AERS: 1.0000 | INHALATION_SPRAY | RESPIRATORY_TRACT | Status: DC | PRN

## 2011-02-23 ENCOUNTER — Telehealth: Payer: Self-pay | Admitting: *Deleted

## 2011-02-23 NOTE — Telephone Encounter (Signed)
Pt called stating the inhaler you ordered, albuterol, cost $ 45 and she can't afford. Can you write for something else?  Pt # [phone]

## 2011-02-25 ENCOUNTER — Encounter: Payer: Self-pay | Admitting: Internal Medicine

## 2011-02-26 ENCOUNTER — Encounter: Payer: Self-pay | Admitting: Vascular Surgery

## 2011-03-02 NOTE — Telephone Encounter (Signed)
Of the short acting inhaled beta 2 agonist, albuterol is the least expensive.

## 2011-03-04 NOTE — Telephone Encounter (Signed)
Pt informed

## 2011-03-12 ENCOUNTER — Encounter (INDEPENDENT_AMBULATORY_CARE_PROVIDER_SITE_OTHER): Payer: Self-pay

## 2011-03-12 DIAGNOSIS — M7989 Other specified soft tissue disorders: Secondary | ICD-10-CM

## 2011-03-17 NOTE — Procedures (Unsigned)
DUPLEX DEEP VENOUS EXAM - LOWER EXTREMITY  INDICATION:  Pain and swelling.  HISTORY:  Edema:  No. Trauma/Surgery:  Yes.  Left common femoral to above the knee popliteal bypass graft. Pain:  Yes. PE:  No. Previous DVT:  No. Anticoagulants:  Yes. Other:  DUPLEX EXAM:               CFV   SFV   PopV  PTV    GSV               R  L  R  L  R  L  R   L  R  L Thrombosis    o  o     o     o      o     o Spontaneous   +  +     +     +      +     + Phasic        +  +     +     +      +     + Augmentation  +  +     +     +      +     + Compressible  +  +     +     +      +     + Competent     +  +     +     +      +     +  Legend:  + - yes  o - no  p - partial  D - decreased  IMPRESSION:  No evidence of acute deep vein thrombosis or superficial thrombophlebitis within the left lower extremity.  The graft within the left extremity continues to be patent.   _____________________________ Fransisco Hertz, MD  OD/MEDQ  D:  03/12/2011  T:  03/12/2011  Job:  [id number]

## 2011-03-29 ENCOUNTER — Other Ambulatory Visit: Payer: Self-pay

## 2011-03-29 LAB — HEMOCCULT GUIAC POC 1CARD (OFFICE)
Card #2 Fecal Occult Blod, POC: NEGATIVE
Card #3 Fecal Occult Blood, POC: NEGATIVE
Fecal Occult Blood, POC: NEGATIVE

## 2011-04-09 ENCOUNTER — Ambulatory Visit (INDEPENDENT_AMBULATORY_CARE_PROVIDER_SITE_OTHER): Payer: Self-pay | Admitting: Vascular Surgery

## 2011-04-09 DIAGNOSIS — I70229 Atherosclerosis of native arteries of extremities with rest pain, unspecified extremity: Secondary | ICD-10-CM

## 2011-04-13 NOTE — Assessment & Plan Note (Signed)
OFFICE VISIT  Kristin, Knight DOB:  27-Aug-1946                                       04/09/2011 GNFAO#:[id number]  HISTORY OF PRESENT ILLNESS:  This is a patient well-known to me, a 65- year-old patient that is now status post a left common femoral artery to above knee popliteal bypass with Propaten.  It was done 12/09/2010.  She has had no symptomatology from that surgery.  However, presents with a chief complaint today of persistent left leg swelling.  Apparently had already had a DVT study on the left leg that was completed prior to this visit, was done on 03/12/2011 that showed no deep vein thrombosis or superficial thrombophlebitis the left leg.  She notes that her symptoms in this left leg, the left leg swells toward the end of the day causing an aching sensation but in the morning the swelling is significantly decreased.  She has never had frank varicosities in either leg.  Denies any symptoms in the right leg.  Past medical history, past surgical history, social history, review of system, medications and allergies are unchanged except for the addition of the previous procedure that was noted.  PHYSICAL EXAMINATION:  Vital signs:  She had a blood pressure 122/69, heart rate 82, respirations were 18.  General:  Well-developed, well- nourished, in no apparent distress.  Pulmonary:  Clear to auscultation bilaterally.  No rales, rhonchi or wheezing.  Cardiac:  Regular rate and rhythm.  Normal S1-S2.  No murmurs, rubs, thrills or gallops.  Vascular: She had palpable pedals bilaterally.  Abdomen:  Soft abdomen, nontender, nondistended.  No guarding, no rebound, no hepatosplenomegaly. Musculoskeletal:  Motor strength 5/5 throughout.  No ischemic changes. All of her wounds are healed in the left leg and have softened. Neurological:  No focal weakness or paresthesias.  Skin:  Her left foot is markedly more edematous than her right foot.  There is  obvious asymmetry that is present.  There is no pitting edema.  However, the edema extends up to about her knee level.  There are no skin changes consistent with chronic venous insufficiency.  Lymphatics:  No cervical, axillary or inguinal lymphadenopathy.  I reviewed the left leg deep vein thrombosis study.  There was no evidence of any DVT on either side however also there is no reflux data on this study.  MEDICAL DECISION MAKING:  This is a 65 year old female with previous bypass on this side.  Her symptomatology now however sounds venous in etiology.  I have a suspicion she may have some degree of chronic venous insufficiency in this leg.  I recommended that we proceed forward with a left leg insufficiency study to evaluate valve function in this leg. Additionally, I would go ahead and start with 15-20 mmHg compression with over-the-counter stockings at this point.  She is going to get these studies done within the next month and will follow up and we will see how she is doing at that point.    Fransisco Hertz, MD Electronically Signed  BLC/MEDQ  D:  04/09/2011  T:  04/13/2011  Job:  [id number]  cc:   Danne Harbor, MD

## 2011-04-30 ENCOUNTER — Ambulatory Visit: Payer: Self-pay | Admitting: Vascular Surgery

## 2011-05-06 ENCOUNTER — Ambulatory Visit (INDEPENDENT_AMBULATORY_CARE_PROVIDER_SITE_OTHER): Payer: Self-pay | Admitting: Internal Medicine

## 2011-05-06 ENCOUNTER — Encounter: Payer: Self-pay | Admitting: Internal Medicine

## 2011-05-06 DIAGNOSIS — R634 Abnormal weight loss: Secondary | ICD-10-CM

## 2011-05-06 DIAGNOSIS — F329 Major depressive disorder, single episode, unspecified: Secondary | ICD-10-CM

## 2011-05-06 DIAGNOSIS — Z1211 Encounter for screening for malignant neoplasm of colon: Secondary | ICD-10-CM

## 2011-05-06 DIAGNOSIS — I10 Essential (primary) hypertension: Secondary | ICD-10-CM

## 2011-05-06 DIAGNOSIS — J449 Chronic obstructive pulmonary disease, unspecified: Secondary | ICD-10-CM

## 2011-05-06 DIAGNOSIS — E785 Hyperlipidemia, unspecified: Secondary | ICD-10-CM

## 2011-05-06 DIAGNOSIS — D649 Anemia, unspecified: Secondary | ICD-10-CM

## 2011-05-06 DIAGNOSIS — J4489 Other specified chronic obstructive pulmonary disease: Secondary | ICD-10-CM

## 2011-05-06 DIAGNOSIS — F3289 Other specified depressive episodes: Secondary | ICD-10-CM

## 2011-05-06 MED ORDER — DOCUSATE SODIUM 100 MG PO CAPS: 100.0000 mg | ORAL_CAPSULE | Freq: Two times a day (BID) | ORAL | Status: AC

## 2011-05-06 MED ORDER — MIRTAZAPINE 15 MG PO TABS: 15.0000 mg | ORAL_TABLET | Freq: Every day | ORAL | Status: DC

## 2011-05-06 MED ORDER — FERROUS SULFATE 325 (65 FE) MG PO TABS: 325.0000 mg | ORAL_TABLET | Freq: Three times a day (TID) | ORAL | Status: DC

## 2011-05-06 MED ORDER — TRIAMTERENE-HCTZ 37.5-25 MG PO TABS: 1.0000 | ORAL_TABLET | Freq: Every day | ORAL | Status: DC

## 2011-05-06 MED ORDER — LISINOPRIL 10 MG PO TABS: 10.0000 mg | ORAL_TABLET | Freq: Every day | ORAL | Status: DC
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
## 2011-05-06 NOTE — Patient Instructions (Signed)
Please make a follow up appointment in 3 months. Continue taking all medication as directed.

## 2011-05-10 ENCOUNTER — Encounter: Payer: Self-pay | Admitting: Internal Medicine

## 2011-05-10 NOTE — Assessment & Plan Note (Signed)
Colonoscopy done at [facility] negative per patient. Will get records.

## 2011-05-10 NOTE — Assessment & Plan Note (Signed)
Improved on remeron. Continue current regimen.

## 2011-05-10 NOTE — Assessment & Plan Note (Signed)
Resolved. Colonoscopy negative. Most likely due to depression. Much improved on remeron.

## 2011-05-10 NOTE — Assessment & Plan Note (Signed)
Due to PVD LDL <100. Will check liver enzymes on follow up and start statin if no contraindication.

## 2011-05-10 NOTE — Assessment & Plan Note (Signed)
Stable  Continue current regimen  

## 2011-05-10 NOTE — Assessment & Plan Note (Signed)
At goal. Continue current regimen. Check renal function on follow up.

## 2011-05-12 NOTE — Progress Notes (Signed)
[facility] [place]  Established Intermittent Claudication  History of Present Illness  Shefali Ng is a 66 y.o. female who presents with chief complaint: follow-up on venous insufficiency studies and 3 month follow up from L fem-AK pop.  The patient's symptoms have resolved.  The patient's symptoms were L leg swelling and leg pain.   The patient's treatment regimen currently included: maximal medical management and OTC compression stockings.  Past Medical History, Past Surgical History, Social History, Family History, Medications, Allergies, and Review of Systems are unchanged from previous evaluation on 04/09/11.  Physical Examination  Filed Vitals:   05/14/11 1410  BP: 147/87  Pulse: 74  Resp: 12    General: A&O x 3, WDWN  Pulmonary: Sym exp, good air movt, CTAB, no rales, rhonchi, & wheezing  Cardiac: RRR, Nl S1, S2, no Murmurs, rubs or gallops  Vascular: Vessel Right Left  Radial Palpable Palpable  [place] Palpable Palpable  Brachial Palpable Palpable  Carotid Palpable, without bruit Palpable, without bruit  Aorta Non-palpable N/A  Femoral Palpable Palpable  Popliteal Non-palpable Non-palpable  PT Palpable Palpable  DP Palpable Palpable   Musculoskeletal: M/S 5/5 throughout, Extremities without ischemic changes   Neurologic: Pain and light touch intact in extremities, Motor exam as listed above  Non-Invasive Vascular Imaging BLE ABI (Date: 05/14/11)  RLE: 1.06, triphasic throughout  LLE: 1.08, triphasic throughout  Graft duplex(Date: 05/14/11)  Widely patent graft with velocities 50-94 c/s  Incidental venous reflux in proximal and mid GSV without SFJ or CFV reflux  Medical Decision Making  Vena Bassinger is a 65 y.o. female who presents with: LLE venous insufficiency and s/p L CFA to AK pop BPG w/ propaten (12/09/10)  The patient has evidence of LLE CVI which accounts for her L leg swelling, which is adequately treated with the OTC  compression stockings so she declines further mgmt at this point.  The bypass graft is widely patent.  I stressed the importance of surveillance to try to keep this graft patent as long as possible.  I would extend the next surveillance to 6 months: BLE ABI and graft duplex.  She will follow up after that appointment.  I discussed in depth with the patient the nature of atherosclerosis, and emphasized the importance of maximal medical management including strict control of blood pressure, blood glucose, and lipid levels, obtaining regular exercise, and cessation of smoking.  The patient is aware that without maximal medical management the underlying atherosclerotic disease process will progress, limiting the benefit of any interventions.  Thank you for allowing [place] to participate in this patient's care.  Leonides Sake, MD [facility] [place] Office: [phone] Pager: [phone]

## 2011-05-14 ENCOUNTER — Encounter: Payer: Self-pay | Admitting: Vascular Surgery

## 2011-05-14 ENCOUNTER — Ambulatory Visit (INDEPENDENT_AMBULATORY_CARE_PROVIDER_SITE_OTHER): Payer: Self-pay | Admitting: Vascular Surgery

## 2011-05-14 ENCOUNTER — Encounter (INDEPENDENT_AMBULATORY_CARE_PROVIDER_SITE_OTHER): Payer: Self-pay

## 2011-05-14 VITALS — BP 147/87 | HR 74 | Resp 12

## 2011-05-14 DIAGNOSIS — I739 Peripheral vascular disease, unspecified: Secondary | ICD-10-CM

## 2011-05-14 DIAGNOSIS — I779 Disorder of arteries and arterioles, unspecified: Secondary | ICD-10-CM

## 2011-05-14 DIAGNOSIS — Z48812 Encounter for surgical aftercare following surgery on the circulatory system: Secondary | ICD-10-CM

## 2011-05-14 HISTORY — DX: Disorder of arteries and arterioles, unspecified: I77.9

## 2011-05-14 NOTE — Progress Notes (Signed)
F/U after vasc lab today.   S/P Left Fem-pop on 12-09-10

## 2011-05-28 NOTE — Procedures (Unsigned)
BYPASS GRAFT EVALUATION  INDICATION:  Followup left femoral to popliteal bypass graft.  HISTORY: Diabetes:  No. Cardiac:  No. Hypertension:  Yes. Smoking:  Currently. Previous Surgery:  Left common femoral artery to popliteal (AK) bypass graft on 12/09/2010 done by Dr. Imogene Burn.  SINGLE LEVEL ARTERIAL EXAM                              RIGHT              LEFT Brachial:                    142                140 Anterior tibial:             145                153 Posterior tibial:            151                147 Peroneal: Ankle/brachial index:        1.06               1.08  PREVIOUS ABI:  Date:  01/15/2011  RIGHT:  1.01  LEFT:  1.03  LOWER EXTREMITY BYPASS GRAFT DUPLEX EXAM:  DUPLEX:  Patent left femoral to popliteal bypass graft. Incidental note made of venous reflux in the proximal and mid left great saphenous vein, however, the saphenofemoral junction and common femoral vein appear patent and without reflux present.  IMPRESSION: 1. Left femoral to popliteal bypass graft patent as noted above. 2. Bilateral ankle brachial indices are >0.95 and considered normal. 3. This is essentially unchanged since previous study on 01/15/2011.  ___________________________________________ Fransisco Hertz, MD  SH/MEDQ  D:  05/14/2011  T:  05/14/2011  Job:  [id number]

## 2011-08-13 LAB — DIFFERENTIAL
Basophils Relative: 1
Lymphocytes Relative: 28
Lymphs Abs: 1.4
Monocytes Absolute: 0.3
Monocytes Relative: 5
Neutro Abs: 3.1
Neutrophils Relative %: 61

## 2011-08-13 LAB — BASIC METABOLIC PANEL
BUN: 6
Chloride: 100
Creatinine, Ser: 0.92
GFR calc Af Amer: >60
GFR calc non Af Amer: >60
Potassium: 3.7

## 2011-08-13 LAB — POCT CARDIAC MARKERS
CKMB, poc: 1.3
Myoglobin, poc: 60
Troponin i, poc: <0.05

## 2011-08-13 LAB — CBC
MCV: 87.4
Platelets: 240
RBC: 4.54
WBC: 5.1

## 2011-08-13 LAB — PROTIME-INR: INR: 1

## 2011-08-13 LAB — B-NATRIURETIC PEPTIDE (CONVERTED LAB): Pro B Natriuretic peptide (BNP): 40

## 2011-08-13 LAB — APTT: aPTT: 36

## 2011-08-13 LAB — D-DIMER, QUANTITATIVE: D-Dimer, Quant: 0.5 — ABNORMAL HIGH

## 2011-08-26 LAB — POCT CARDIAC MARKERS
CKMB, poc: <1 — ABNORMAL LOW
Myoglobin, poc: 65.1
Operator id: 270111
Operator id: 288831
Troponin i, poc: <0.05

## 2011-08-26 LAB — I-STAT 8, (EC8 V) (CONVERTED LAB)
BUN: 8
Bicarbonate: 29.7 — ABNORMAL HIGH
Chloride: 101
Glucose, Bld: 90
HCT: 40
pCO2, Ven: 51.5 — ABNORMAL HIGH

## 2011-08-26 LAB — B-NATRIURETIC PEPTIDE (CONVERTED LAB): Pro B Natriuretic peptide (BNP): 104 — ABNORMAL HIGH

## 2011-08-26 LAB — D-DIMER, QUANTITATIVE: D-Dimer, Quant: 0.81 — ABNORMAL HIGH

## 2011-11-12 ENCOUNTER — Ambulatory Visit: Payer: Self-pay | Admitting: Vascular Surgery

## 2011-11-25 ENCOUNTER — Other Ambulatory Visit: Payer: Self-pay | Admitting: Internal Medicine

## 2011-11-25 DIAGNOSIS — F329 Major depressive disorder, single episode, unspecified: Secondary | ICD-10-CM

## 2011-11-26 MED ORDER — MIRTAZAPINE 15 MG PO TABS: 15.0000 mg | ORAL_TABLET | Freq: Every day | ORAL | Status: DC

## 2011-11-26 NOTE — Telephone Encounter (Signed)
Remeron rx called to [facility] pharmacy.

## 2011-12-09 ENCOUNTER — Encounter: Payer: Self-pay | Admitting: Vascular Surgery

## 2011-12-10 ENCOUNTER — Ambulatory Visit: Payer: Self-pay | Admitting: Vascular Surgery

## 2011-12-10 ENCOUNTER — Other Ambulatory Visit: Payer: Self-pay

## 2011-12-21 ENCOUNTER — Ambulatory Visit (INDEPENDENT_AMBULATORY_CARE_PROVIDER_SITE_OTHER): Payer: Self-pay | Admitting: Internal Medicine

## 2011-12-21 ENCOUNTER — Encounter: Payer: Self-pay | Admitting: Internal Medicine

## 2011-12-21 VITALS — BP 116/77 | HR 62 | Temp 97.1°F | Ht 64.5 in | Wt 126.0 lb

## 2011-12-21 DIAGNOSIS — I779 Disorder of arteries and arterioles, unspecified: Secondary | ICD-10-CM

## 2011-12-21 DIAGNOSIS — D649 Anemia, unspecified: Secondary | ICD-10-CM

## 2011-12-21 DIAGNOSIS — Z87891 Personal history of nicotine dependence: Secondary | ICD-10-CM

## 2011-12-21 DIAGNOSIS — Z0000 Encounter for general adult medical examination without abnormal findings: Secondary | ICD-10-CM

## 2011-12-21 DIAGNOSIS — M1990 Unspecified osteoarthritis, unspecified site: Secondary | ICD-10-CM

## 2011-12-21 DIAGNOSIS — M549 Dorsalgia, unspecified: Secondary | ICD-10-CM

## 2011-12-21 DIAGNOSIS — I739 Peripheral vascular disease, unspecified: Secondary | ICD-10-CM

## 2011-12-21 DIAGNOSIS — E785 Hyperlipidemia, unspecified: Secondary | ICD-10-CM

## 2011-12-21 DIAGNOSIS — I10 Essential (primary) hypertension: Secondary | ICD-10-CM

## 2011-12-21 DIAGNOSIS — Z862 Personal history of diseases of the blood and blood-forming organs and certain disorders involving the immune mechanism: Secondary | ICD-10-CM

## 2011-12-21 DIAGNOSIS — J449 Chronic obstructive pulmonary disease, unspecified: Secondary | ICD-10-CM

## 2011-12-21 DIAGNOSIS — J4489 Other specified chronic obstructive pulmonary disease: Secondary | ICD-10-CM

## 2011-12-21 DIAGNOSIS — F17200 Nicotine dependence, unspecified, uncomplicated: Secondary | ICD-10-CM

## 2011-12-21 DIAGNOSIS — F3289 Other specified depressive episodes: Secondary | ICD-10-CM

## 2011-12-21 DIAGNOSIS — F329 Major depressive disorder, single episode, unspecified: Secondary | ICD-10-CM

## 2011-12-21 DIAGNOSIS — I743 Embolism and thrombosis of arteries of the lower extremities: Secondary | ICD-10-CM

## 2011-12-21 LAB — COMPREHENSIVE METABOLIC PANEL
Albumin: 4.3 g/dL (ref 3.5–5.2)
BUN: 12 mg/dL (ref 6–23)
Calcium: 9.6 mg/dL (ref 8.4–10.5)
Chloride: 99 mEq/L (ref 96–112)
Creat: 0.92 mg/dL (ref 0.50–1.10)
Glucose, Bld: 130 mg/dL — ABNORMAL HIGH (ref 70–99)
Potassium: 3.6 mEq/L (ref 3.5–5.3)

## 2011-12-21 LAB — CBC
HCT: 41.1 % (ref 36.0–46.0)
Hemoglobin: 12.8 g/dL (ref 12.0–15.0)
MCHC: 31.1 g/dL (ref 30.0–36.0)
MCV: 89.7 fL (ref 78.0–100.0)
RDW: 13.8 % (ref 11.5–15.5)
WBC: 6.7 10*3/uL (ref 4.0–10.5)

## 2011-12-21 LAB — LIPID PANEL
HDL: 49 mg/dL (ref >39)
Total CHOL/HDL Ratio: 4.2 Ratio
Triglycerides: 101 mg/dL (ref <150)

## 2011-12-21 MED ORDER — NAPROXEN 375 MG PO TABS: 375.0000 mg | ORAL_TABLET | Freq: Two times a day (BID) | ORAL | Status: DC | PRN

## 2011-12-21 NOTE — Assessment & Plan Note (Signed)
At goal with lisinopril and triamterine/hctz.  Checking cmp today.

## 2011-12-21 NOTE — Assessment & Plan Note (Signed)
S/p fem-pop bypass in 2012, has f/u with surgeon in March.  Doing well, no c/o.  We did discuss the importance of smoking cessation given this disease.

## 2011-12-21 NOTE — Progress Notes (Signed)
Subjective:     Patient ID: Kristin Knight, female   DOB: [birth year], 66 y.o.   MRN: [id number]  HPI PT is a very pleasant 66 yo F with a h/o HTN, depression, anemia, tobacco abuse, and PVD s/o fem-pop bypass in 2012 who presents for f/u.  Overall the pt is doing quite well.  She c/o some infrequent posterior cervical neck pain, worse c extension.  This is not overly bothersome b/c it is infrequent, but it does really hurt when it comes on.  She has no claudication symptoms.  She con't to smoke 10 cig's day  Review of Systems     Objective:   Physical Exam     Assessment:         Plan:

## 2011-12-21 NOTE — Assessment & Plan Note (Signed)
Pt currently smoking again for past year, 10 cigs/day.  Interested in quitting.  Discussed options, going with 1800 quit now line given no insurance.  Will have medicare in 6 weeks (turnign 66), so can pursue other options in near future.

## 2011-12-21 NOTE — Assessment & Plan Note (Signed)
Pt stopped Fe once rx ran out.  That was many months ago.  Will recheck CBC today and restart Fe if necessary.

## 2011-12-21 NOTE — Assessment & Plan Note (Signed)
Doing well on remeron.  Continue.

## 2011-12-21 NOTE — Assessment & Plan Note (Addendum)
Pt currently not taking any inhalers.  Need to discuss more next visit.  Does have PFT's on file inddicating mild disease.  Did not have response to bronchodilators on PFTs, so not suprising that she didn't use albuterol.
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
## 2011-12-21 NOTE — Assessment & Plan Note (Signed)
Checking lipids and cmp today since it has been a long time since she was last in clinic.

## 2011-12-22 ENCOUNTER — Telehealth: Payer: Self-pay | Admitting: *Deleted

## 2011-12-22 ENCOUNTER — Other Ambulatory Visit: Payer: Self-pay | Admitting: Internal Medicine

## 2011-12-22 MED ORDER — PRAVASTATIN SODIUM 20 MG PO TABS: 20.0000 mg | ORAL_TABLET | Freq: Every day | ORAL | Status: DC

## 2011-12-22 NOTE — Progress Notes (Signed)
Called to pharm 

## 2011-12-22 NOTE — Telephone Encounter (Signed)
Call to pt to inform her of the need to start a medication for her Cholesterol level.  Message left on pt's answering machine that the Clinics had called concerning her lab work and that a prescription for medication had been called to her pharmacy.  Angelina Ok, RN 12/22/2011 4:10 PM

## 2011-12-23 ENCOUNTER — Other Ambulatory Visit: Payer: Self-pay | Admitting: Internal Medicine

## 2011-12-23 DIAGNOSIS — Z1231 Encounter for screening mammogram for malignant neoplasm of breast: Secondary | ICD-10-CM

## 2012-01-20 ENCOUNTER — Ambulatory Visit (HOSPITAL_COMMUNITY)
Admission: RE | Admit: 2012-01-20 | Discharge: 2012-01-20 | Disposition: A | Discharge status: Home / Self Care | Payer: Medicare Other | Source: Ambulatory Visit | Attending: Internal Medicine | Admitting: Internal Medicine

## 2012-01-20 DIAGNOSIS — Z1231 Encounter for screening mammogram for malignant neoplasm of breast: Secondary | ICD-10-CM

## 2012-01-27 ENCOUNTER — Encounter: Payer: Self-pay | Admitting: Vascular Surgery

## 2012-01-28 ENCOUNTER — Ambulatory Visit: Payer: Self-pay | Admitting: Vascular Surgery

## 2012-02-03 ENCOUNTER — Encounter: Payer: Self-pay | Admitting: Vascular Surgery

## 2012-02-04 ENCOUNTER — Ambulatory Visit (INDEPENDENT_AMBULATORY_CARE_PROVIDER_SITE_OTHER): Payer: Medicare Other | Admitting: Vascular Surgery

## 2012-02-04 ENCOUNTER — Encounter: Payer: Self-pay | Admitting: Vascular Surgery

## 2012-02-04 VITALS — BP 133/75 | HR 61 | Temp 98.1°F | Resp 18 | Ht 64.0 in | Wt 125.6 lb

## 2012-02-04 DIAGNOSIS — I739 Peripheral vascular disease, unspecified: Secondary | ICD-10-CM

## 2012-02-04 DIAGNOSIS — Z48812 Encounter for surgical aftercare following surgery on the circulatory system: Secondary | ICD-10-CM

## 2012-02-04 DIAGNOSIS — I70219 Atherosclerosis of native arteries of extremities with intermittent claudication, unspecified extremity: Secondary | ICD-10-CM | POA: Diagnosis not present

## 2012-02-04 NOTE — Progress Notes (Signed)
[facility] [place]  Established Previous Bypass  History of Present Illness  Kristin Knight is a 66 y.o. (1945-12-07) female who presents with chief complaint: routine surveillance.  Previous operation(s) include: L CFA to AK Pop bypass (Date: 12/09/10).  The patient's prior rest pain symptoms have resolved.  The patient's treatment regimen currently included: maximal medical management.  Past Medical History, Past Surgical History, Social History, Family History, Medications, Allergies, and Review of Systems are unchanged from previous evaluation on 05/14/11.  Physical Examination  Filed Vitals:   02/04/12 1518  BP: 133/75  Pulse: 61  Temp: 98.1 F (36.7 C)  Resp: 18  Height: 5\' 4"  (1.626 m)  Weight: 125 lb 9.6 oz (56.972 kg)   Body mass index is 21.56 kg/(m^2).  General: A&O x 3, WDWN  Pulmonary: Sym exp, good air movt, CTAB, no rales, rhonchi, & wheezing  Cardiac: RRR, Nl S1, S2, no Murmurs, rubs or gallops  Vascular: Vessel Right Left  Radial Palpable Palpable  Brachial Palpable Palpable  Carotid Palpable, without bruit Palpable, without bruit  Aorta Non-palpable N/A  Femoral Palpable Palpable  Popliteal Non-palpable Non-palpable  PT Palpable Palpable  DP Palpable Palpable   Musculoskeletal: M/S 5/5 throughout , Extremities without ischemic changes , LLE incisions well healed  Neurologic: Pain and light touch intact in extremities , Motor exam as listed above  Non-Invasive Vascular Imaging ABI (Date: 02/04/12)  RLE: 1.03, PT and DP: triphasic  LLE: 1.00, PT and DP: triphasic  LLE graft Duplex (Date: 02/04/11)  CFA: PSV 128 c/s  Fem-pop graft: 41-121 c/s  Widely patent graft without any lesions noted  Medical Decision Making  Kristin Knight is a 66 y.o. female who presents with: s/p L CFA to AK pop BPG w/ Propatent .  Based on the patient's vascular studies and examination, I have offered the patient: continued surveillance in 6  months with BLE ABI and L graft duplex.  If continued stable findings, would stretch out surveillance to annual..  I discussed in depth with the patient the nature of atherosclerosis, and emphasized the importance of maximal medical management including strict control of blood pressure, blood glucose, and lipid levels, obtaining regular exercise, and cessation of smoking.  The patient is aware that without maximal medical management the underlying atherosclerotic disease process will progress, limiting the benefit of any interventions.  I discussed in depth with the patient the need for long term surveillance to improve the primary assisted patency of his bypass.  The patient agrees to cooperate with such.  The patient is scheduled for the previous listed surveillance studies in 6 months.  Thank you for allowing [place] to participate in this patient's care.  Leonides Sake, MD [facility] [place] Office: [phone] Pager: [phone]  02/04/2012, 4:57 PM

## 2012-02-10 NOTE — Procedures (Unsigned)
BYPASS GRAFT EVALUATION  INDICATION:  Peripheral vascular disease  HISTORY: Diabetes:  No Cardiac:  No Hypertension:  Yes Smoking:  Currently Previous Surgery:  Left femoral to popliteal artery bypass 12/09/2010  SINGLE LEVEL ARTERIAL EXAM                              RIGHT              LEFT Brachial: Anterior tibial: Posterior tibial: Peroneal: Ankle/brachial index:        1.03               1.0  PREVIOUS ABI:  Date:  05/14/2011  RIGHT:  1.06  LEFT:  1.08  LOWER EXTREMITY BYPASS GRAFT DUPLEX EXAM:  DUPLEX:  No evidence of hemodynamically significant plaque identified in the left lower extremity arterial system.  IMPRESSION: 1. Patent left femoral to popliteal artery bypass graft. 2. Bilateral ankle brachial indices are greater than 0.95 and     considered normal. 3. Unchanged since previous study on 05/14/2011.      ___________________________________________ Fransisco Hertz, MD  SH/MEDQ  D:  02/04/2012  T:  02/04/2012  Job:  [id number]

## 2012-02-15 NOTE — Progress Notes (Signed)
Addended by: Bufford Spikes on: 02/15/2012 04:20 PM   Modules accepted: Orders

## 2012-02-29 ENCOUNTER — Ambulatory Visit (INDEPENDENT_AMBULATORY_CARE_PROVIDER_SITE_OTHER): Payer: Medicare Other | Admitting: Internal Medicine

## 2012-02-29 ENCOUNTER — Encounter: Payer: Self-pay | Admitting: Internal Medicine

## 2012-02-29 VITALS — BP 91/55 | HR 69 | Temp 97.8°F | Ht 64.0 in | Wt 121.0 lb

## 2012-02-29 DIAGNOSIS — Z87891 Personal history of nicotine dependence: Secondary | ICD-10-CM | POA: Diagnosis not present

## 2012-02-29 DIAGNOSIS — M62838 Other muscle spasm: Secondary | ICD-10-CM

## 2012-02-29 DIAGNOSIS — Z0000 Encounter for general adult medical examination without abnormal findings: Secondary | ICD-10-CM

## 2012-02-29 DIAGNOSIS — D649 Anemia, unspecified: Secondary | ICD-10-CM

## 2012-02-29 DIAGNOSIS — I779 Disorder of arteries and arterioles, unspecified: Secondary | ICD-10-CM

## 2012-02-29 DIAGNOSIS — I743 Embolism and thrombosis of arteries of the lower extremities: Secondary | ICD-10-CM

## 2012-02-29 DIAGNOSIS — I10 Essential (primary) hypertension: Secondary | ICD-10-CM

## 2012-02-29 DIAGNOSIS — M549 Dorsalgia, unspecified: Secondary | ICD-10-CM

## 2012-02-29 DIAGNOSIS — E785 Hyperlipidemia, unspecified: Secondary | ICD-10-CM | POA: Diagnosis not present

## 2012-02-29 LAB — LIPID PANEL
HDL: 49 mg/dL (ref >39)
LDL Cholesterol: 66 mg/dL (ref 0–99)
Total CHOL/HDL Ratio: 2.9 Ratio
Triglycerides: 146 mg/dL (ref <150)
VLDL: 29 mg/dL (ref 0–40)

## 2012-02-29 MED ORDER — BACLOFEN 10 MG PO TABS: 10.0000 mg | ORAL_TABLET | Freq: Two times a day (BID) | ORAL | Status: DC | PRN

## 2012-02-29 NOTE — Assessment & Plan Note (Signed)
S/p fem-pop bypass in 2012, just saw Dr. Imogene Burn and had f/u US.  All is good, graft is patent, abi's are good.  Will con't to f/u and work with medical management. - BP under control - lipids good but working to get better - working on smoking cessation

## 2012-02-29 NOTE — Assessment & Plan Note (Signed)
At goal.  BP soft today, but pt has no dizziness or orthostasis.  No tachycardia.  Will con't lisinopril and triamterine/hctz
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
## 2012-02-29 NOTE — Assessment & Plan Note (Signed)
LDL last visit came back at 130s, which is above goal given strict medical management in setting of PVD.  Had started on pravastatin.  Will recheck lipids today and f/u. - check lipid panel today

## 2012-02-29 NOTE — Assessment & Plan Note (Signed)
Last CBC showed no anemia, but has a h/o iron deficiency anemia.  Pt due for a screening colonoscopy anyway, as she has never had one.  Now that she has medicare, this should not be difficult.  She is amenable. - refer to GI for colonoscopy

## 2012-02-29 NOTE — Progress Notes (Signed)
Subjective:     Patient ID: Kristin Knight, female   DOB: [birth year], 66 y.o.   MRN: [id number]  HPI Pt is a very pleasant 66 yo F with a pmh including PVD s/p bypass last year, HTN, COPD, tobacco abuse, and HL who presents for fu.  Please see problem list for history, assessment, and plan  Review of Systems deferred    Objective:   Physical Exam Gen: pleasant, NAD MSK: visible deformity of L trapezius, no nodule, just enlarged when compared to R.  TTP over firm swelling.  Good ROM in neck and L arm.  No pain with movement.  Nl neurologic exam in LUE.    Assessment:         Plan:

## 2012-02-29 NOTE — Assessment & Plan Note (Signed)
Pt still smoking 1/2 PPD, interested in quitting.  Had quit in 2003 but restarted in 2012.  Discussed the importance of this given her PAD and bypass operation.  She would like to pursue either patches or medical therapy, whichever is cheaper.  I will call her pharmacy to find out.  Chantix is not a good option for her given her h/o depression - patches vs zyban - call pharmacy

## 2012-03-01 ENCOUNTER — Other Ambulatory Visit: Payer: Self-pay | Admitting: Internal Medicine

## 2012-03-14 ENCOUNTER — Ambulatory Visit (INDEPENDENT_AMBULATORY_CARE_PROVIDER_SITE_OTHER): Payer: Medicare Other | Admitting: Family Medicine

## 2012-03-14 ENCOUNTER — Encounter: Payer: Self-pay | Admitting: Family Medicine

## 2012-03-14 VITALS — BP 106/71 | HR 52

## 2012-03-14 DIAGNOSIS — M62838 Other muscle spasm: Secondary | ICD-10-CM | POA: Diagnosis not present

## 2012-03-14 DIAGNOSIS — G2589 Other specified extrapyramidal and movement disorders: Secondary | ICD-10-CM

## 2012-03-14 DIAGNOSIS — R279 Unspecified lack of coordination: Secondary | ICD-10-CM | POA: Diagnosis not present

## 2012-03-16 ENCOUNTER — Ambulatory Visit: Payer: Medicare Other | Attending: Family Medicine | Admitting: Rehabilitative and Restorative Service Providers"

## 2012-03-16 DIAGNOSIS — R293 Abnormal posture: Secondary | ICD-10-CM | POA: Diagnosis not present

## 2012-03-16 DIAGNOSIS — M542 Cervicalgia: Secondary | ICD-10-CM | POA: Insufficient documentation

## 2012-03-16 DIAGNOSIS — IMO0001 Reserved for inherently not codable concepts without codable children: Secondary | ICD-10-CM | POA: Diagnosis not present
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
## 2012-03-22 ENCOUNTER — Ambulatory Visit: Payer: Medicare Other | Admitting: Rehabilitative and Restorative Service Providers"

## 2012-03-22 DIAGNOSIS — M542 Cervicalgia: Secondary | ICD-10-CM | POA: Diagnosis not present

## 2012-03-22 DIAGNOSIS — IMO0001 Reserved for inherently not codable concepts without codable children: Secondary | ICD-10-CM | POA: Diagnosis not present

## 2012-03-22 DIAGNOSIS — R293 Abnormal posture: Secondary | ICD-10-CM | POA: Diagnosis not present

## 2012-03-28 ENCOUNTER — Ambulatory Visit: Payer: Medicare Other | Admitting: Physical Therapy

## 2012-03-28 DIAGNOSIS — R293 Abnormal posture: Secondary | ICD-10-CM | POA: Diagnosis not present

## 2012-03-28 DIAGNOSIS — M542 Cervicalgia: Secondary | ICD-10-CM | POA: Diagnosis not present

## 2012-03-28 DIAGNOSIS — IMO0001 Reserved for inherently not codable concepts without codable children: Secondary | ICD-10-CM | POA: Diagnosis not present

## 2012-03-29 ENCOUNTER — Ambulatory Visit: Payer: Medicare Other | Admitting: Physical Therapy

## 2012-04-03 ENCOUNTER — Ambulatory Visit: Payer: Medicare Other | Admitting: Rehabilitative and Restorative Service Providers"

## 2012-04-05 ENCOUNTER — Encounter: Payer: Medicare Other | Admitting: Rehabilitative and Restorative Service Providers"

## 2012-04-11 ENCOUNTER — Encounter: Payer: Medicare Other | Admitting: Internal Medicine

## 2012-04-12 ENCOUNTER — Other Ambulatory Visit: Payer: Self-pay | Admitting: *Deleted

## 2012-04-12 MED ORDER — TRIAMTERENE-HCTZ 37.5-25 MG PO TABS: 1.0000 | ORAL_TABLET | Freq: Every day | ORAL | Status: DC

## 2012-04-25 ENCOUNTER — Ambulatory Visit: Payer: Medicare Other | Admitting: Family Medicine

## 2012-05-04 NOTE — Addendum Note (Signed)
Addended by: Neomia Dear on: 05/04/2012 05:02 PM   Modules accepted: Orders

## 2012-05-05 ENCOUNTER — Telehealth: Payer: Self-pay | Admitting: *Deleted

## 2012-05-05 NOTE — Telephone Encounter (Signed)
Pt called has had white productive cough for past month along with chest congestion and wheezing. Denies temp. Appt made for today 05/05/12. Stanton Kidney Mattea Seger RN 05/05/12 10AM

## 2012-05-08 ENCOUNTER — Ambulatory Visit (INDEPENDENT_AMBULATORY_CARE_PROVIDER_SITE_OTHER): Payer: Medicare Other | Admitting: Internal Medicine

## 2012-05-08 ENCOUNTER — Encounter: Payer: Self-pay | Admitting: Internal Medicine

## 2012-05-08 VITALS — BP 115/86 | HR 76 | Temp 97.3°F | Ht 64.0 in | Wt 115.4 lb

## 2012-05-08 DIAGNOSIS — F3289 Other specified depressive episodes: Secondary | ICD-10-CM

## 2012-05-08 DIAGNOSIS — R05 Cough: Secondary | ICD-10-CM

## 2012-05-08 DIAGNOSIS — I10 Essential (primary) hypertension: Secondary | ICD-10-CM | POA: Diagnosis not present

## 2012-05-08 DIAGNOSIS — E785 Hyperlipidemia, unspecified: Secondary | ICD-10-CM | POA: Diagnosis not present

## 2012-05-08 DIAGNOSIS — Z1211 Encounter for screening for malignant neoplasm of colon: Secondary | ICD-10-CM

## 2012-05-08 DIAGNOSIS — R634 Abnormal weight loss: Secondary | ICD-10-CM

## 2012-05-08 DIAGNOSIS — R059 Cough, unspecified: Secondary | ICD-10-CM | POA: Diagnosis not present

## 2012-05-08 DIAGNOSIS — Z23 Encounter for immunization: Secondary | ICD-10-CM

## 2012-05-08 DIAGNOSIS — R946 Abnormal results of thyroid function studies: Secondary | ICD-10-CM | POA: Diagnosis not present

## 2012-05-08 DIAGNOSIS — F329 Major depressive disorder, single episode, unspecified: Secondary | ICD-10-CM | POA: Diagnosis not present

## 2012-05-08 DIAGNOSIS — Z20818 Contact with and (suspected) exposure to other bacterial communicable diseases: Secondary | ICD-10-CM

## 2012-05-08 DIAGNOSIS — Z2089 Contact with and (suspected) exposure to other communicable diseases: Secondary | ICD-10-CM

## 2012-05-08 MED ORDER — PAROXETINE HCL 20 MG PO TABS: 20.0000 mg | ORAL_TABLET | Freq: Every day | ORAL | Status: DC

## 2012-05-08 MED ORDER — AZITHROMYCIN 250 MG PO TABS: ORAL_TABLET | ORAL | Status: AC

## 2012-05-08 MED ORDER — FLUTICASONE PROPIONATE 50 MCG/ACT NA SUSP: 2.0000 | Freq: Every day | NASAL | Status: DC

## 2012-05-08 MED ORDER — PRAVASTATIN SODIUM 20 MG PO TABS: 20.0000 mg | ORAL_TABLET | Freq: Every day | ORAL | Status: DC

## 2012-05-08 MED ORDER — IPRATROPIUM-ALBUTEROL 18-103 MCG/ACT IN AERO: 2.0000 | INHALATION_SPRAY | Freq: Four times a day (QID) | RESPIRATORY_TRACT | Status: DC | PRN

## 2012-05-08 NOTE — Assessment & Plan Note (Signed)
Lipid Panel     Component Value Date/Time   CHOL 144 02/29/2012 1556   TRIG 146 02/29/2012 1556   HDL 49 02/29/2012 1556   CHOLHDL 2.9 02/29/2012 1556   VLDL 29 02/29/2012 1556   LDLCALC 66 02/29/2012 1556   LDL now at goal on pravastatin.

## 2012-05-08 NOTE — Assessment & Plan Note (Signed)
Patient with 6 pound weight loss in the past 2 months, worsening appetite, decreased energy, decreased interest in activities. She has not been taking her Remeron for the past 2 weeks. Overall, it seems her depression is more poorly controlled at this time. She had some interest in changing medicines. I think it is reasonable to try a different SSRI given that she now has insurance. Remeron should be tapered, but she has not taken in 2 weeks. - Start Paxil 20 mg in the morning, dose can be titrated up at followup - RTC 6 weeks to reevaluate and titrate dose

## 2012-05-08 NOTE — Assessment & Plan Note (Signed)
At goal, continue lisinopril and triamterene/HCTZ.

## 2012-05-08 NOTE — Assessment & Plan Note (Signed)
Looking at her chart, the patient's weight is 6 pounds down over the past 2 months. She has weighed less in 2011, but her weight 13 pounds more one year ago. Per prior notes, colonoscopy was negative (but I do not find the media or encounter for this), and it was felt her weight loss was most likely due to depression. It had improved on Remeron. Given her additional depressive symptoms today, I think this is still due to depression. We are starting Paxil. If she has continued weight loss over the next few months, I would widen the differential and consider additional testing. - Paxil - Continue to follow

## 2012-05-08 NOTE — Assessment & Plan Note (Addendum)
Supposedly negative, but I do not find any note from [facility]. She is due for another colonoscopy, we have referred her to Dr. Haywood Pao office but she has missed 2 appointment. She will arrange for a third hopefully, which is her last chance.

## 2012-05-08 NOTE — Progress Notes (Signed)
Subjective:     Patient ID: Kristin Knight, female   DOB: [birth year], 66 y.o.   MRN: [id number]  HPI Patient is a 66 year old woman with history of depression, PVD status post bypass, hyperlipidemia, hypertension who presents for followup.  Cough: Patient complains of one month of nighttime cough with mildly productive white sputum. She also has subjective wheezing. She denies rhinorrhea, nasal congestion, sore throat, reflux symptoms, fevers. Notably, the patient says she tends to get these symptoms every spring. She is to be on an inhaler for the symptoms. Also of note, the patient's granddaughter was recently diagnosed (4 days ago) with pertussis. The patient denies any significant daytime cough, any vomiting.  Shoulder pain: The patient has seen sports medicine for her trap and shoulder pain, and has been to physical therapy that is ongoing. She still does have pain with activity in the shoulder.  Depression: The patient admits to decreased appetite, decreased energy for the past 2 months. She does have decreased interest in her daily activities as well as 6 pound weight loss since her last visit 2 months ago.  Review of Systems As per history of present illness    Objective:   Physical Exam GEN: NAD.  Alert and oriented x 3.  Pleasant, conversant, and cooperative to exam. RESP:  CTAB, no w/r/r CARDIOVASCULAR: RRR, S1, S2, no m/r/g ABDOMEN: soft, NT/ND, NABS EXT: warm and dry. No edema in b/l LE     Assessment:         Plan:

## 2012-05-08 NOTE — Assessment & Plan Note (Signed)
Patient with 1 month of nighttime cough that she says is recurrent in the spring. Incidentally, her granddaughter tested positive for pertussis 4 days ago at the [employer]. Based on her symptoms, I do not think the patient has active pertussis at this time, but prophylaxis and vaccination are nonetheless recommended. In terms of her cough, I think this is likely seasonal allergy with a postnasal drip. Other considerations include ACE inhibitor cough, reflux, less likely malignancy (though she does have a smoking history) - Azithromycin x5 days - Tdap - Flonase - Combivent inhaler - RTC 6 weeks to evaluate - If continued cough, would consider chest x-ray, versus PPI, versus stopping ACE

## 2012-05-08 NOTE — Assessment & Plan Note (Signed)
Didn't realize her TSH from last visit had been canceled. Need to get TSH and T4 at next visit

## 2012-06-27 ENCOUNTER — Encounter: Payer: Self-pay | Admitting: Internal Medicine

## 2012-06-27 ENCOUNTER — Ambulatory Visit (INDEPENDENT_AMBULATORY_CARE_PROVIDER_SITE_OTHER): Payer: Medicare Other | Admitting: Internal Medicine

## 2012-06-27 VITALS — BP 139/91 | HR 62 | Temp 97.2°F | Ht 64.0 in | Wt 109.3 lb

## 2012-06-27 DIAGNOSIS — R059 Cough, unspecified: Secondary | ICD-10-CM

## 2012-06-27 DIAGNOSIS — I10 Essential (primary) hypertension: Secondary | ICD-10-CM | POA: Diagnosis not present

## 2012-06-27 DIAGNOSIS — R05 Cough: Secondary | ICD-10-CM

## 2012-06-27 DIAGNOSIS — Z87891 Personal history of nicotine dependence: Secondary | ICD-10-CM

## 2012-06-27 MED ORDER — PANTOPRAZOLE SODIUM 40 MG PO TBEC: 40.0000 mg | DELAYED_RELEASE_TABLET | Freq: Every day | ORAL | Status: DC
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
## 2012-06-27 NOTE — Patient Instructions (Addendum)
1. Follow up in 3 weeks.

## 2012-06-27 NOTE — Assessment & Plan Note (Signed)
Patient has had over 8 weeks chronic cough without any other symptoms except for occasional throat irritates. Her cough are mainly at night time. She reports that her cough only happens at summer.  - will try PPI today and bring her back in 3 weeks  - if she continues to have similar cough, will perform CXR and stop  Her ACEI - continues to stop smoking.

## 2012-06-27 NOTE — Assessment & Plan Note (Signed)
Well controlled continue current regimen.  

## 2012-08-03 ENCOUNTER — Encounter: Payer: Self-pay | Admitting: Neurosurgery

## 2012-08-04 ENCOUNTER — Encounter (INDEPENDENT_AMBULATORY_CARE_PROVIDER_SITE_OTHER): Payer: Medicare Other | Admitting: *Deleted

## 2012-08-04 ENCOUNTER — Ambulatory Visit (INDEPENDENT_AMBULATORY_CARE_PROVIDER_SITE_OTHER): Payer: Medicare Other | Admitting: *Deleted

## 2012-08-04 ENCOUNTER — Ambulatory Visit (INDEPENDENT_AMBULATORY_CARE_PROVIDER_SITE_OTHER): Payer: Medicare Other | Admitting: Neurosurgery

## 2012-08-04 DIAGNOSIS — I739 Peripheral vascular disease, unspecified: Secondary | ICD-10-CM

## 2012-08-04 DIAGNOSIS — I70219 Atherosclerosis of native arteries of extremities with intermittent claudication, unspecified extremity: Secondary | ICD-10-CM

## 2012-08-04 DIAGNOSIS — Z48812 Encounter for surgical aftercare following surgery on the circulatory system: Secondary | ICD-10-CM | POA: Diagnosis not present
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
## 2012-08-07 NOTE — Addendum Note (Signed)
Addended by: Dannielle Karvonen on: 08/07/2012 09:50 AM   Modules accepted: Orders

## 2012-08-31 ENCOUNTER — Other Ambulatory Visit: Payer: Self-pay | Admitting: *Deleted

## 2012-08-31 DIAGNOSIS — F3289 Other specified depressive episodes: Secondary | ICD-10-CM

## 2012-08-31 DIAGNOSIS — F329 Major depressive disorder, single episode, unspecified: Secondary | ICD-10-CM

## 2012-08-31 DIAGNOSIS — I10 Essential (primary) hypertension: Secondary | ICD-10-CM

## 2012-09-01 ENCOUNTER — Telehealth: Payer: Self-pay | Admitting: *Deleted

## 2012-09-01 MED ORDER — PAROXETINE HCL 20 MG PO TABS: 20.0000 mg | ORAL_TABLET | Freq: Every day | ORAL | Status: DC

## 2012-09-01 MED ORDER — TRIAMTERENE-HCTZ 37.5-25 MG PO TABS: 1.0000 | ORAL_TABLET | Freq: Every day | ORAL | Status: DC

## 2012-09-01 NOTE — Telephone Encounter (Signed)
Pt calls and ask for refill on bp med states her bp this am was 167/116, slight h/a, denies N&V, change in vision, chest pain or shortness of breath. appt scheduled for tues 10/22 0930 dr brown per chilonb.

## 2012-09-04 ENCOUNTER — Other Ambulatory Visit: Payer: Self-pay | Admitting: Internal Medicine

## 2012-09-04 MED ORDER — LISINOPRIL 10 MG PO TABS: 10.0000 mg | ORAL_TABLET | Freq: Every day | ORAL | Status: DC

## 2012-09-04 NOTE — Telephone Encounter (Signed)
I sent in a refill for her Lisinopril.  Maxzide appears to have been sent in on 08/31/12.

## 2012-09-05 ENCOUNTER — Ambulatory Visit (INDEPENDENT_AMBULATORY_CARE_PROVIDER_SITE_OTHER): Payer: Medicare Other | Admitting: Internal Medicine

## 2012-09-05 ENCOUNTER — Encounter: Payer: Self-pay | Admitting: Internal Medicine

## 2012-09-05 VITALS — BP 139/96 | HR 71 | Temp 97.8°F | Ht 64.0 in | Wt 108.1 lb

## 2012-09-05 DIAGNOSIS — Z23 Encounter for immunization: Secondary | ICD-10-CM | POA: Diagnosis not present

## 2012-09-05 DIAGNOSIS — Z0000 Encounter for general adult medical examination without abnormal findings: Secondary | ICD-10-CM

## 2012-09-05 DIAGNOSIS — M129 Arthropathy, unspecified: Secondary | ICD-10-CM | POA: Diagnosis not present

## 2012-09-05 DIAGNOSIS — I10 Essential (primary) hypertension: Secondary | ICD-10-CM | POA: Diagnosis not present

## 2012-09-05 DIAGNOSIS — M549 Dorsalgia, unspecified: Secondary | ICD-10-CM

## 2012-09-05 DIAGNOSIS — M1990 Unspecified osteoarthritis, unspecified site: Secondary | ICD-10-CM

## 2012-09-05 DIAGNOSIS — Z1211 Encounter for screening for malignant neoplasm of colon: Secondary | ICD-10-CM

## 2012-09-05 LAB — SEDIMENTATION RATE: Sed Rate: 5 mm/hr (ref 0–22)

## 2012-09-05 LAB — RHEUMATOID FACTOR: Rhuematoid fact SerPl-aCnc: <10 IU/mL (ref <=14)

## 2012-09-05 MED ORDER — TRAMADOL HCL 50 MG PO TABS: 50.0000 mg | ORAL_TABLET | Freq: Four times a day (QID) | ORAL | Status: DC | PRN

## 2012-09-05 MED ORDER — ALBUTEROL SULFATE HFA 108 (90 BASE) MCG/ACT IN AERS: 2.0000 | INHALATION_SPRAY | Freq: Four times a day (QID) | RESPIRATORY_TRACT | Status: DC | PRN
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
## 2012-09-05 NOTE — Patient Instructions (Signed)
For your blood pressure, we are increasing your dose of Lisinopril.  Take 20 mg (2 of your current tablets), once per day.  I am sending in a new prescription for the 20 mg tablets; take 1 tablet per day when you fill this new prescription -continue to take your Maxzide medication  For your joint pain, we're sending studies today for rheumatoid arthritis.  We will call you if the results are abnormal. -in the meantime, to manage your joint pain, we are prescribing Tramadol.  Take 1 tablet every 4-6 hours as needed for pain.  We are giving your your Flu and Pneumonia shots today.  Please return for a follow-up visit in 5-6 months

## 2012-09-06 DIAGNOSIS — Z0000 Encounter for general adult medical examination without abnormal findings: Secondary | ICD-10-CM | POA: Insufficient documentation

## 2012-09-06 LAB — CYCLIC CITRUL PEPTIDE ANTIBODY, IGG: Cyclic Citrullin Peptide Ab: <2 U/mL (ref 0.0–5.0)

## 2012-09-06 MED ORDER — LISINOPRIL 20 MG PO TABS: 20.0000 mg | ORAL_TABLET | Freq: Every day | ORAL | Status: DC

## 2012-09-06 NOTE — Assessment & Plan Note (Signed)
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 -tramadol for pain -checking ESR, RF, CCP

## 2012-09-06 NOTE — Assessment & Plan Note (Signed)
-  flu shot given today -pneumovax given today -fecal occult blood cards given today

## 2012-09-06 NOTE — Assessment & Plan Note (Signed)
The patient presents for a BP check with an elevated diastolic blood pressure. -increase dose of lisinopril from 10 to 20 mg/day

## 2012-09-13 ENCOUNTER — Encounter (HOSPITAL_COMMUNITY): Payer: Self-pay | Admitting: Emergency Medicine

## 2012-09-13 ENCOUNTER — Inpatient Hospital Stay (HOSPITAL_COMMUNITY)
Admission: EM | Admit: 2012-09-13 | Discharge: 2012-09-18 | DRG: 300 | Disposition: A | Discharge status: Home Health | Payer: Medicare Other | Attending: Vascular Surgery | Admitting: Vascular Surgery

## 2012-09-13 ENCOUNTER — Emergency Department (HOSPITAL_COMMUNITY): Payer: Medicare Other

## 2012-09-13 ENCOUNTER — Emergency Department (HOSPITAL_COMMUNITY)
Admission: EM | Admit: 2012-09-13 | Discharge: 2012-09-13 | Discharge status: Against Medical Advice | Payer: Medicare Other | Attending: Emergency Medicine | Admitting: Emergency Medicine

## 2012-09-13 DIAGNOSIS — IMO0002 Reserved for concepts with insufficient information to code with codable children: Secondary | ICD-10-CM | POA: Diagnosis not present

## 2012-09-13 DIAGNOSIS — F329 Major depressive disorder, single episode, unspecified: Secondary | ICD-10-CM | POA: Diagnosis not present

## 2012-09-13 DIAGNOSIS — Y849 Medical procedure, unspecified as the cause of abnormal reaction of the patient, or of later complication, without mention of misadventure at the time of the procedure: Secondary | ICD-10-CM | POA: Diagnosis not present

## 2012-09-13 DIAGNOSIS — R05 Cough: Secondary | ICD-10-CM | POA: Diagnosis not present

## 2012-09-13 DIAGNOSIS — I739 Peripheral vascular disease, unspecified: Secondary | ICD-10-CM

## 2012-09-13 DIAGNOSIS — Z79899 Other long term (current) drug therapy: Secondary | ICD-10-CM

## 2012-09-13 DIAGNOSIS — I10 Essential (primary) hypertension: Secondary | ICD-10-CM | POA: Diagnosis present

## 2012-09-13 DIAGNOSIS — E785 Hyperlipidemia, unspecified: Secondary | ICD-10-CM | POA: Diagnosis present

## 2012-09-13 DIAGNOSIS — J438 Other emphysema: Secondary | ICD-10-CM | POA: Diagnosis not present

## 2012-09-13 DIAGNOSIS — J4489 Other specified chronic obstructive pulmonary disease: Secondary | ICD-10-CM | POA: Diagnosis present

## 2012-09-13 DIAGNOSIS — F17200 Nicotine dependence, unspecified, uncomplicated: Secondary | ICD-10-CM | POA: Diagnosis present

## 2012-09-13 DIAGNOSIS — Z7901 Long term (current) use of anticoagulants: Secondary | ICD-10-CM

## 2012-09-13 DIAGNOSIS — Z880 Allergy status to penicillin: Secondary | ICD-10-CM

## 2012-09-13 DIAGNOSIS — D649 Anemia, unspecified: Secondary | ICD-10-CM | POA: Diagnosis present

## 2012-09-13 DIAGNOSIS — G8929 Other chronic pain: Secondary | ICD-10-CM | POA: Diagnosis present

## 2012-09-13 DIAGNOSIS — I70509 Unspecified atherosclerosis of nonautologous biological bypass graft(s) of the extremities, unspecified extremity: Principal | ICD-10-CM | POA: Diagnosis present

## 2012-09-13 DIAGNOSIS — Z01811 Encounter for preprocedural respiratory examination: Secondary | ICD-10-CM | POA: Diagnosis not present

## 2012-09-13 DIAGNOSIS — F1011 Alcohol abuse, in remission: Secondary | ICD-10-CM | POA: Diagnosis present

## 2012-09-13 DIAGNOSIS — M79609 Pain in unspecified limb: Secondary | ICD-10-CM | POA: Insufficient documentation

## 2012-09-13 DIAGNOSIS — I70219 Atherosclerosis of native arteries of extremities with intermittent claudication, unspecified extremity: Secondary | ICD-10-CM | POA: Diagnosis present

## 2012-09-13 DIAGNOSIS — M549 Dorsalgia, unspecified: Secondary | ICD-10-CM | POA: Diagnosis present

## 2012-09-13 DIAGNOSIS — F3289 Other specified depressive episodes: Secondary | ICD-10-CM | POA: Diagnosis present

## 2012-09-13 DIAGNOSIS — D3000 Benign neoplasm of unspecified kidney: Secondary | ICD-10-CM | POA: Diagnosis present

## 2012-09-13 DIAGNOSIS — G4700 Insomnia, unspecified: Secondary | ICD-10-CM | POA: Diagnosis present

## 2012-09-13 DIAGNOSIS — Y9210 Unspecified residential institution as the place of occurrence of the external cause: Secondary | ICD-10-CM | POA: Diagnosis not present

## 2012-09-13 DIAGNOSIS — J449 Chronic obstructive pulmonary disease, unspecified: Secondary | ICD-10-CM | POA: Diagnosis present

## 2012-09-13 LAB — BASIC METABOLIC PANEL
BUN: 12 mg/dL (ref 6–23)
Creatinine, Ser: 0.78 mg/dL (ref 0.50–1.10)
GFR calc Af Amer: >90 mL/min (ref >90)
GFR calc non Af Amer: 86 mL/min — ABNORMAL LOW (ref >90)
Glucose, Bld: 92 mg/dL (ref 70–99)

## 2012-09-13 LAB — CBC WITH DIFFERENTIAL/PLATELET
Basophils Relative: 0 % (ref 0–1)
Eosinophils Absolute: 0.1 10*3/uL (ref 0.0–0.7)
Eosinophils Relative: 1 % (ref 0–5)
HCT: 39 % (ref 36.0–46.0)
Hemoglobin: 12.5 g/dL (ref 12.0–15.0)
MCH: 28.7 pg (ref 26.0–34.0)
MCHC: 32.1 g/dL (ref 30.0–36.0)
MCV: 89.4 fL (ref 78.0–100.0)
Monocytes Absolute: 0.6 10*3/uL (ref 0.1–1.0)
Monocytes Relative: 8 % (ref 3–12)

## 2012-09-13 LAB — POCT I-STAT, CHEM 8
Calcium, Ion: 1.19 mmol/L (ref 1.13–1.30)
Glucose, Bld: 89 mg/dL (ref 70–99)
HCT: 41 % (ref 36.0–46.0)
TCO2: 29 mmol/L (ref 0–100)

## 2012-09-13 NOTE — ED Notes (Signed)
Pt at desk stating symptoms have resolved, states she will be leaving, encouraged patient to stay or return if symptoms returned. Pt ambulatory to door.

## 2012-09-13 NOTE — ED Provider Notes (Addendum)
11:38 PM I spoke with the vascular surgeon regarding her lack of pulse in the dorsalis pedis region.  She seems to have rest pain at this time that gets worse when she walks.  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  He requested the patient be n.p.o.  Blood work pending at this time   Date: 09/14/2012  Rate: 66  Rhythm: normal sinus rhythm  QRS Axis: normal  Intervals: normal  ST/T Wave abnormalities: normal  Conduction Disutrbances: none  Narrative Interpretation:   Old EKG Reviewed: No significant changes noted     Lyanne Co, MD 09/13/12 [id number]  Lyanne Co, MD 09/14/12 [phone]

## 2012-09-13 NOTE — ED Notes (Signed)
Pt reports having pain to L calf to thigh pain; pt reports having peripheral bypass by Dr Imogene Burn in 2011; + DP pulse, CMS intact; pt reports pain worse with ambulation

## 2012-09-13 NOTE — ED Notes (Signed)
Pt c/o right leg pain at calf starting this am; pt denies injury; pt sts hx of bypass graft in left foot in past; CMS intact

## 2012-09-13 NOTE — ED Notes (Signed)
Patient states that she was lying on bed and left leg felt numb as if it was "asleep". She got up attempted to walk around but that made it worse. Now she has an aching pain in the left leg from her calf area up to her thigh.
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
## 2012-09-14 ENCOUNTER — Encounter (HOSPITAL_COMMUNITY)
Admission: EM | Disposition: A | Discharge status: Home Health | Payer: Self-pay | Source: Home / Self Care | Attending: Vascular Surgery

## 2012-09-14 ENCOUNTER — Encounter (HOSPITAL_COMMUNITY): Payer: Self-pay | Admitting: Radiology

## 2012-09-14 ENCOUNTER — Inpatient Hospital Stay (HOSPITAL_COMMUNITY): Payer: Medicare Other

## 2012-09-14 DIAGNOSIS — I739 Peripheral vascular disease, unspecified: Secondary | ICD-10-CM

## 2012-09-14 DIAGNOSIS — Z01811 Encounter for preprocedural respiratory examination: Secondary | ICD-10-CM | POA: Diagnosis not present

## 2012-09-14 DIAGNOSIS — J438 Other emphysema: Secondary | ICD-10-CM | POA: Diagnosis not present

## 2012-09-14 DIAGNOSIS — Z48812 Encounter for surgical aftercare following surgery on the circulatory system: Secondary | ICD-10-CM

## 2012-09-14 LAB — CBC
HCT: 38.9 % (ref 36.0–46.0)
Hemoglobin: 12.4 g/dL (ref 12.0–15.0)
MCH: 28.3 pg (ref 26.0–34.0)
MCH: 28.9 pg (ref 26.0–34.0)
MCHC: 31.5 g/dL (ref 30.0–36.0)
MCV: 90 fL (ref 78.0–100.0)
MCV: 89.8 fL (ref 78.0–100.0)
Platelets: 192 10*3/uL (ref 150–400)
RDW: 13.6 % (ref 11.5–15.5)

## 2012-09-14 LAB — BASIC METABOLIC PANEL
BUN: 10 mg/dL (ref 6–23)
CO2: 31 mEq/L (ref 19–32)
Calcium: 9.6 mg/dL (ref 8.4–10.5)
GFR calc non Af Amer: 87 mL/min — ABNORMAL LOW (ref >90)
Glucose, Bld: 110 mg/dL — ABNORMAL HIGH (ref 70–99)
Potassium: 3.5 mEq/L (ref 3.5–5.1)

## 2012-09-14 LAB — URINE MICROSCOPIC-ADD ON

## 2012-09-14 LAB — URINALYSIS, ROUTINE W REFLEX MICROSCOPIC
Bilirubin Urine: NEGATIVE
Hgb urine dipstick: NEGATIVE
Protein, ur: NEGATIVE mg/dL
Urobilinogen, UA: 0.2 mg/dL (ref 0.0–1.0)

## 2012-09-14 LAB — PROTIME-INR
Prothrombin Time: 13.7 seconds (ref 11.6–15.2)
Prothrombin Time: 14.3 seconds (ref 11.6–15.2)

## 2012-09-14 LAB — SURGICAL PCR SCREEN: Staphylococcus aureus: NEGATIVE

## 2012-09-14 SURGERY — BYPASS GRAFT FEMORAL-POPLITEAL ARTERY
Anesthesia: Choice | Laterality: Left

## 2012-09-14 MED ORDER — TENECTEPLASE 50 MG IV KIT
0.5000 mg/h | PACK | INTRAVENOUS | Status: DC
  Filled 2012-09-14: qty 1

## 2012-09-14 MED ORDER — HEPARIN (PORCINE) IN NACL 100-0.45 UNIT/ML-% IJ SOLN
800.0000 [IU]/h | INTRAMUSCULAR | Status: DC
  Filled 2012-09-14: qty 250

## 2012-09-14 MED ORDER — LABETALOL HCL 5 MG/ML IV SOLN
10.0000 mg | INTRAVENOUS | Status: DC | PRN
  Filled 2012-09-14: qty 4

## 2012-09-14 MED ORDER — GUAIFENESIN-DM 100-10 MG/5ML PO SYRP: 15.0000 mL | ORAL_SOLUTION | ORAL | Status: DC | PRN

## 2012-09-14 MED ORDER — ZOLPIDEM TARTRATE 5 MG PO TABS
5.0000 mg | ORAL_TABLET | Freq: Every evening | ORAL | Status: DC | PRN
  Administered 2012-09-14 – 2012-09-17 (×4): 5 mg via ORAL
  Filled 2012-09-14 (×4): qty 1

## 2012-09-14 MED ORDER — PANTOPRAZOLE SODIUM 40 MG PO TBEC
40.0000 mg | DELAYED_RELEASE_TABLET | Freq: Every day | ORAL | Status: DC
  Administered 2012-09-15 – 2012-09-18 (×4): 40 mg via ORAL
  Filled 2012-09-14 (×4): qty 1

## 2012-09-14 MED ORDER — HYDRALAZINE HCL 20 MG/ML IJ SOLN
10.0000 mg | INTRAMUSCULAR | Status: DC | PRN
  Administered 2012-09-14: 10 mg via INTRAVENOUS
  Filled 2012-09-14: qty 1
  Filled 2012-09-14: qty 0.5

## 2012-09-14 MED ORDER — IODIXANOL 320 MG/ML IV SOLN
150.0000 mL | Freq: Once | INTRAVENOUS | Status: AC | PRN
  Administered 2012-09-14: 60 mL via INTRA_ARTERIAL

## 2012-09-14 MED ORDER — CEFUROXIME SODIUM 1.5 G IJ SOLR
1.5000 g | INTRAMUSCULAR | Status: AC
  Administered 2012-09-15: 1.5 g via INTRAVENOUS
  Filled 2012-09-14 (×2): qty 1.5

## 2012-09-14 MED ORDER — POTASSIUM CHLORIDE CRYS ER 20 MEQ PO TBCR
20.0000 meq | EXTENDED_RELEASE_TABLET | Freq: Once | ORAL | Status: AC
  Administered 2012-09-14: 40 meq via ORAL
  Filled 2012-09-14: qty 2

## 2012-09-14 MED ORDER — SODIUM CHLORIDE 0.9 % IJ SOLN
3.0000 mL | INTRAMUSCULAR | Status: DC | PRN
  Administered 2012-09-14: 3 mL via INTRAVENOUS

## 2012-09-14 MED ORDER — METOPROLOL TARTRATE 1 MG/ML IV SOLN: 2.0000 mg | INTRAVENOUS | Status: DC | PRN

## 2012-09-14 MED ORDER — MIDAZOLAM HCL 2 MG/2ML IJ SOLN
INTRAMUSCULAR | Status: AC | PRN
  Administered 2012-09-14 (×2): 1 mg via INTRAVENOUS

## 2012-09-14 MED ORDER — MIDAZOLAM HCL 2 MG/2ML IJ SOLN
1.0000 mg | INTRAMUSCULAR | Status: DC | PRN
  Administered 2012-09-14: 1 mg via INTRAVENOUS
  Filled 2012-09-14: qty 2

## 2012-09-14 MED ORDER — FENTANYL CITRATE 0.05 MG/ML IJ SOLN
INTRAMUSCULAR | Status: AC | PRN
  Administered 2012-09-14: 50 ug via INTRAVENOUS

## 2012-09-14 MED ORDER — SODIUM CHLORIDE 0.9 % IJ SOLN
3.0000 mL | Freq: Two times a day (BID) | INTRAMUSCULAR | Status: DC
  Administered 2012-09-14: 3 mL via INTRAVENOUS
  Administered 2012-09-15: 6 mL via INTRAVENOUS
  Administered 2012-09-16 – 2012-09-18 (×4): 3 mL via INTRAVENOUS

## 2012-09-14 MED ORDER — HEPARIN (PORCINE) IN NACL 100-0.45 UNIT/ML-% IJ SOLN
800.0000 [IU]/h | INTRAMUSCULAR | Status: DC
  Administered 2012-09-14: 500 [IU]/h via INTRAVENOUS
  Administered 2012-09-15 (×2): 700 [IU]/h via INTRAVENOUS
  Filled 2012-09-14 (×3): qty 250

## 2012-09-14 MED ORDER — SODIUM CHLORIDE 0.9 % IV SOLN: 250.0000 mL | INTRAVENOUS | Status: DC | PRN

## 2012-09-14 MED ORDER — ONDANSETRON HCL 4 MG/2ML IJ SOLN: 4.0000 mg | Freq: Four times a day (QID) | INTRAMUSCULAR | Status: DC | PRN

## 2012-09-14 MED ORDER — PHENOL 1.4 % MT LIQD
1.0000 | OROMUCOSAL | Status: DC | PRN
  Filled 2012-09-14: qty 177

## 2012-09-14 MED ORDER — MORPHINE SULFATE 2 MG/ML IJ SOLN
2.0000 mg | INTRAMUSCULAR | Status: DC | PRN
  Administered 2012-09-14 – 2012-09-15 (×4): 2 mg via INTRAVENOUS
  Filled 2012-09-14 (×4): qty 1

## 2012-09-14 MED ORDER — TENECTEPLASE 50 MG IV KIT
0.4000 mg/h | PACK | INTRAVENOUS | Status: DC
  Administered 2012-09-14 – 2012-09-15 (×3): 0.4 mg/h
  Filled 2012-09-14 (×5): qty 0.5

## 2012-09-14 MED ORDER — ALUM & MAG HYDROXIDE-SIMETH 200-200-20 MG/5ML PO SUSP: 15.0000 mL | ORAL | Status: DC | PRN

## 2012-09-14 MED ORDER — HEPARIN (PORCINE) IN NACL 100-0.45 UNIT/ML-% IJ SOLN
400.0000 [IU]/h | INTRAMUSCULAR | Status: DC
  Administered 2012-09-14: 400 [IU]/h via INTRAVENOUS
  Filled 2012-09-14: qty 250

## 2012-09-14 MED ORDER — KCL IN DEXTROSE-NACL 20-5-0.45 MEQ/L-%-% IV SOLN
INTRAVENOUS | Status: DC
  Administered 2012-09-14 – 2012-09-16 (×4): via INTRAVENOUS
  Filled 2012-09-14 (×10): qty 1000

## 2012-09-14 NOTE — Progress Notes (Signed)
[facility]  Daily Progress Note  Assessment/Planning: Thrombosed L fem-pop BPG   Given acute thrombosis yesterday, I think thrombolysis is a good option in this patient.  Her flow rates on graft duplex have been normal, so I am somewhat surprised with the sudden occlusion.  Subjective    Some rest pain in left foot  Objective Filed Vitals:   09/14/12 0100 09/14/12 0115 09/14/12 0230 09/14/12 0233  BP: 128/88   137/81  Pulse: 67 66  62  Temp:    98.6 F (37 C)  TempSrc:    Oral  Resp: 13 16  18   Height:   5\' 4"  (1.626 m)   Weight:   103 lb 2.8 oz (46.8 kg)   SpO2: 99% 98%  100%    Intake/Output Summary (Last 24 hours) at 09/14/12 1013 Last data filed at 09/14/12 0730  Gross per 24 hour  Intake      0 ml  Output      0 ml  Net      0 ml    PULM  CTAB CV  RRR GI  soft, NTND VASC  Warm lower leg, no palpable pulses  Laboratory CBC    Component Value Date/Time   WBC 6.4 09/14/2012 0615   HGB 12.4 09/14/2012 0615   HCT 39.4 09/14/2012 0615   PLT 231 09/14/2012 0615    BMET    Component Value Date/Time   NA 138 09/14/2012 0615   K 3.5 09/14/2012 0615   CL 99 09/14/2012 0615   CO2 31 09/14/2012 0615   GLUCOSE 110* 09/14/2012 0615   BUN 10 09/14/2012 0615   CREATININE 0.75 09/14/2012 0615   CREATININE 0.92 12/21/2011 1629   CALCIUM 9.6 09/14/2012 0615   GFRNONAA 87* 09/14/2012 0615   GFRAA >90 09/14/2012 0615    Leonides Sake, MD [facility] Office: [phone] Pager: [phone]  09/14/2012, 10:13 AM
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
## 2012-09-14 NOTE — Clinical Documentation Improvement (Signed)
BMI DOCUMENTATION CLARIFICATION QUERY  THIS DOCUMENT IS NOT A PERMANENT PART OF THE MEDICAL RECORD  TO RESPOND TO THE THIS QUERY, FOLLOW THE INSTRUCTIONS BELOW:  1. If needed, update documentation for the patient's encounter via the notes activity.  2. Access this query again and click edit on the In [employer].  3. After updating, or not, click F2 to complete all highlighted (required) fields concerning your review. Select "additional documentation in the medical record" OR "no additional documentation provided".  4. Click Sign note button.  5. The deficiency will fall out of your In Basket *Please let us know if you are not able to complete this workflow by phone or e-mail (listed below).         09/14/12  Dear Dr. Hart Rochester Marton Redwood  In an effort to better capture your patient's severity of illness, reflect appropriate length of stay and utilization of resources, a review of the patient medical record has revealed the following indicators.    Based on your clinical judgment, please clarify and document in a progress note and/or discharge summary the clinical condition associated with the following supporting information:  In responding to this query please exercise your independent judgment.  The fact that a query is asked, does not imply that any particular answer is desired or expected.   Possible Clinical conditions  Underweight w/BMI= 17.7  Other condition  Cannot Clinically determine    Sign & Symptoms: Weight: 103.2lb Height:   5'4" BMI=     17.7   Reviewed:No additional documentation provided.  Thank You,  Marciano Sequin,  Clinical Documentation Specialist:  Pager: [phone]  Health Information Management [place]
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
## 2012-09-14 NOTE — Progress Notes (Signed)
Name: Kristin Knight MRN: [id number] DOB: 1946/05/01  ELECTRONIC ICU PHYSICIAN NOTE  Problem:  Insomnia, requesting Remus Loffler  Intervention:  ambien 5 mg qhs prn  Sandrea Hughs 09/14/2012, 9:03 PM

## 2012-09-14 NOTE — Care Management Note (Signed)
    Page 1 of 2   09/19/2012     3:40:51 PM   CARE MANAGEMENT NOTE 09/19/2012  Patient:  Kristin Knight, Kristin Knight   Account Number:  [id number]  Date Initiated:  09/14/2012  Documentation initiated by:  Jalyiah Shelley  Subjective/Objective Assessment:   PT ADM ON 09/13/12 WITH OCCLUDED FEM-POP GRAFT.  PTA, PT INDEPENDENT, LIVES WITH HER TWO SONS.     Action/Plan:   MET WITH PT AND FAMILY TO DISCUSS DC PLANS.  PT STATES FAMILY TO PROVIDE CARE AT DISCHARGE.  WILL FOLLOW FOR HOME NEEDS AS PT PROGRESSES.   Anticipated DC Date:  09/16/2012   Anticipated DC Plan:  HOME W HOME HEALTH SERVICES      DC Planning Services  CM consult  Medication Assistance      [facility] Choice  HOME HEALTH   Choice offered to / List presented to:  C-1 Patient        HH arranged  HH-1 RN      [facility] agency  [facility]   Status of service:  Completed, signed off Medicare Important Message given?   (If response is "NO", the following Medicare IM given date fields will be blank) Date Medicare IM given:   Date Additional Medicare IM given:    Discharge Disposition:  HOME W HOME HEALTH SERVICES  Per UR Regulation:  Reviewed for med. necessity/level of care/duration of stay  If discussed at Long Length of Stay Meetings, dates discussed:    Comments:  09/18/12 Kristin Wich,RN,BSN [phone] PT NEEDS HOME LOVENOX, BUT HAS NO PART D MEDICARE. MEDICATION ASSISTANCE GIVEN THROUGH [facility]. PT GIVEN ACCEPTANCE LETTER AND EXPLAINED BENEFIT.  SHE UNDERSTANDS SHE HAS $3 COPAY FOR EACH MED, AND WILL GO TO [facility] TO PICK UP RX.  SHE IS APPRECIATIVE OF HELP GIVEN.  [facility]N ARRANGED THROUGH [facility] TO ASSIST WITH HOME LOVENOX INJECTIONS/TEACHING AND FOR PT/INR DRAWS.

## 2012-09-14 NOTE — Clinical Documentation Improvement (Signed)
GENERIC DOCUMENTATION CLARIFICATION QUERY  THIS DOCUMENT IS NOT A PERMANENT PART OF THE MEDICAL RECORD  TO RESPOND TO THE THIS QUERY, FOLLOW THE INSTRUCTIONS BELOW:  1. If needed, update documentation for the patient's encounter via the notes activity.  2. Access this query again and click edit on the In [employer].  3. After updating, or not, click F2 to complete all highlighted (required) fields concerning your review. Select "additional documentation in the medical record" OR "no additional documentation provided".  4. Click Sign note button.  5. The deficiency will fall out of your In Basket *Please let us know if you are not able to complete this workflow by phone or e-mail (listed below).  Please update your documentation within the medical record to reflect your response to this query.                                                                                        09/14/12   Dear [facility],  In a better effort to capture your patient's severity of illness, reflect appropriate length of stay and utilization of resources, a review of the patient medical record has revealed the following indicators.    Based on your clinical judgment, please clarify and document in a progress note and/or discharge summary the clinical condition associated with the following supporting information:  In responding to this query please exercise your independent judgment.  The fact that a query is asked, does not imply that any particular answer is desired or expected.  Possible Clinical Conditions?  _______Hypokalemia   _______Other Condition  _______Cannot Clinically Determine     Diagnostics: 10/30: potassium=3.0  Treatment: 10/31: kdur po x1   You may use possible, probable, or suspect with inpatient documentation. possible, probable, suspected diagnoses MUST be documented at the time of discharge  Reviewed: No additional documentation provided.      Thank You,  Marciano Sequin,  Clinical Documentation Specialist:  Pager: [phone]  Health Information Management [place]

## 2012-09-14 NOTE — Progress Notes (Signed)
Patient transported via bed to radiology for IR procedure. Kristin Knight

## 2012-09-14 NOTE — ED Notes (Signed)
Bed assignment RM # [id number]

## 2012-09-14 NOTE — Procedures (Signed)
Procedure:  Left lower extremity angio, infusion thrombolysis of left fem-pop graft Access:  Right CFA, 6 Fr sheath. Findings:  Reconstituted distal SFA, open popliteal with open PT to foot and small peroneal artery.  After fem-pop BPG cath, injection shows tight distal anastamotic stenosis with above knee pop.  90 cm length x 40 cm infusion length cath advanced into graft. Plan:  Begin TNK thrombolytic therapy at dose of 0.4 mg/hr via infusion cath.  Continue IV heparin, check heparin levels and fibrinogen every 6 hours.  Will recheck angio tomorrow.

## 2012-09-14 NOTE — Progress Notes (Signed)
Pt c/o difficulty voiding. Bedpan given, pt on for almost 48 minutes, unable to void. Bladder scan done, urine in bladder. Foley catheter placed per previous order, urine return emptied from bag after foley placement.
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
## 2012-09-14 NOTE — H&P (Signed)
Agree 
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
## 2012-09-14 NOTE — Progress Notes (Signed)
VASCULAR LAB PRELIMINARY  PRELIMINARY  PRELIMINARY  PRELIMINARY  Left lower extremity arterial duplex completed.    Preliminary report:  The fem-above knee popliteal bypass graft appears occluded.  Farley Crooker, 09/14/2012, 1:11 PM

## 2012-09-15 ENCOUNTER — Inpatient Hospital Stay (HOSPITAL_COMMUNITY): Payer: Medicare Other

## 2012-09-15 DIAGNOSIS — I743 Embolism and thrombosis of arteries of the lower extremities: Secondary | ICD-10-CM

## 2012-09-15 DIAGNOSIS — T82598A Other mechanical complication of other cardiac and vascular devices and implants, initial encounter: Secondary | ICD-10-CM | POA: Diagnosis not present

## 2012-09-15 LAB — FIBRINOGEN
Fibrinogen: 198 mg/dL — ABNORMAL LOW (ref 204–475)
Fibrinogen: 234 mg/dL (ref 204–475)
Fibrinogen: 218 mg/dL (ref 204–475)

## 2012-09-15 LAB — CBC
HCT: 37.2 % (ref 36.0–46.0)
HCT: 36.8 % (ref 36.0–46.0)
MCH: 28.8 pg (ref 26.0–34.0)
MCHC: 32 g/dL (ref 30.0–36.0)
MCV: 89 fL (ref 78.0–100.0)
MCV: 90.6 fL (ref 78.0–100.0)
Platelets: 141 10*3/uL — ABNORMAL LOW (ref 150–400)
Platelets: 147 10*3/uL — ABNORMAL LOW (ref 150–400)
RBC: 4.06 MIL/uL (ref 3.87–5.11)
RDW: 13.6 % (ref 11.5–15.5)
RDW: 13.8 % (ref 11.5–15.5)
WBC: 9.2 10*3/uL (ref 4.0–10.5)

## 2012-09-15 LAB — URINALYSIS, ROUTINE W REFLEX MICROSCOPIC
Bilirubin Urine: NEGATIVE
Glucose, UA: NEGATIVE mg/dL
Hgb urine dipstick: NEGATIVE
Protein, ur: NEGATIVE mg/dL
Urobilinogen, UA: 0.2 mg/dL (ref 0.0–1.0)

## 2012-09-15 LAB — BASIC METABOLIC PANEL
BUN: 4 mg/dL — ABNORMAL LOW (ref 6–23)
CO2: 24 mEq/L (ref 19–32)
Calcium: 8.9 mg/dL (ref 8.4–10.5)
Chloride: 106 mEq/L (ref 96–112)
Creatinine, Ser: 0.6 mg/dL (ref 0.50–1.10)

## 2012-09-15 LAB — HEPARIN LEVEL (UNFRACTIONATED)
Heparin Unfractionated: 0.34 IU/mL (ref 0.30–0.70)
Heparin Unfractionated: 0.21 IU/mL — ABNORMAL LOW (ref 0.30–0.70)

## 2012-09-15 MED ORDER — NALOXONE HCL 0.4 MG/ML IJ SOLN: 0.4000 mg | INTRAMUSCULAR | Status: DC | PRN

## 2012-09-15 MED ORDER — SODIUM CHLORIDE 0.9 % IV SOLN
INTRAVENOUS | Status: DC
  Administered 2012-09-15: 19:00:00 via INTRAVENOUS

## 2012-09-15 MED ORDER — HEPARIN BOLUS VIA INFUSION
1000.0000 [IU] | Freq: Once | INTRAVENOUS | Status: AC
  Administered 2012-09-15: 1000 [IU] via INTRAVENOUS
  Filled 2012-09-15: qty 1000

## 2012-09-15 MED ORDER — ONDANSETRON HCL 4 MG/2ML IJ SOLN: 4.0000 mg | Freq: Four times a day (QID) | INTRAMUSCULAR | Status: DC | PRN

## 2012-09-15 MED ORDER — DIPHENHYDRAMINE HCL 12.5 MG/5ML PO ELIX
12.5000 mg | ORAL_SOLUTION | Freq: Four times a day (QID) | ORAL | Status: DC | PRN
  Filled 2012-09-15: qty 5

## 2012-09-15 MED ORDER — DIPHENHYDRAMINE HCL 50 MG/ML IJ SOLN: 12.5000 mg | Freq: Four times a day (QID) | INTRAMUSCULAR | Status: DC | PRN

## 2012-09-15 MED ORDER — IODIXANOL 320 MG/ML IV SOLN
100.0000 mL | Freq: Once | INTRAVENOUS | Status: AC | PRN
  Administered 2012-09-15: 30 mL via INTRAVENOUS

## 2012-09-15 MED ORDER — SODIUM CHLORIDE 0.9 % IJ SOLN: 9.0000 mL | INTRAMUSCULAR | Status: DC | PRN

## 2012-09-15 MED ORDER — MORPHINE SULFATE (PF) 1 MG/ML IV SOLN
INTRAVENOUS | Status: DC
  Administered 2012-09-15: 03:00:00 via INTRAVENOUS
  Administered 2012-09-16: 7.5 mg via INTRAVENOUS
  Administered 2012-09-16: 1.35 mg via INTRAVENOUS
  Administered 2012-09-16: 3 mg via INTRAVENOUS
  Administered 2012-09-16: 1.5 mg via INTRAVENOUS
  Filled 2012-09-15 (×3): qty 25

## 2012-09-15 NOTE — Progress Notes (Signed)
ANTICOAGULATION CONSULT NOTE - Follow Up Consult  Pharmacy Consult for Heparin Indication: Thrombosed Left Fem-Pop, s/p TNKase infusion thrombolysis  Patient Measurements: Height: 5\' 4"  (162.6 cm) Weight: 103 lb 2.8 oz (46.8 kg) IBW/kg (Calculated) : 54.7  Heparin Dosing Weight: 46.8kg  Vital Signs: Temp: 99.3 F (37.4 C) (11/01 2327) Temp src: Axillary (11/01 2327) BP: 114/62 mmHg (11/01 2200) Pulse Rate: 67  (11/01 1800)  Labs:  Basename 09/15/12 2159 09/15/12 1821 09/15/12 0939 09/15/12 0550 09/15/12 0108 09/14/12 1836 09/14/12 1316 09/14/12 0615 09/14/12 0015 09/13/12 2336  HGB -- -- -- 11.7* 11.9* -- -- -- -- --  HCT -- -- -- 36.8 37.2 38.9 -- -- -- --  PLT -- -- -- 147* 141* 192 -- -- -- --  APTT -- -- -- -- -- -- 47* -- -- --  LABPROT -- -- -- -- -- -- -- 14.3 13.7 --  INR -- -- -- -- -- -- -- 1.13 1.06 --  HEPARINUNFRC 0.18* 0.21* 0.34 -- -- -- -- -- -- --  CREATININE -- -- -- 0.60 -- -- -- 0.75 -- 1.00  CKTOTAL -- -- -- -- -- -- -- -- -- --  CKMB -- -- -- -- -- -- -- -- -- --  TROPONINI -- -- -- -- -- -- -- -- -- --    Estimated Creatinine Clearance: 51.8 ml/min (by C-G formula based on Cr of 0.6).  Assessment: 66yo female s/p infusion thrombolysis with TNKase and IV heparin. Follow up angiogram shows patent vessels with blood flow to the foot.  TNKase finished at 1800 11/1. Heparin drip 700 uts/hr with HL 0.18 (subtherapeutic) - continued downward trend today.  No bleeding issues noted.  Goal of Therapy:  Heparin level 0.3-0.7 Monitor platelets by anticoagulation protocol: Yes   Plan:  1. Increase heparin to 800 units/hr.  2. Recheck with a.m. labs  Christoper Fabian, PharmD, BCPS Clinical pharmacist, pager [phone] 09/15/2012 11:39 PM

## 2012-09-15 NOTE — ED Notes (Signed)
TNK off and Heparin gtt off per order Dr Miles Costain

## 2012-09-15 NOTE — Progress Notes (Signed)
[facility]    I was called to the patients room secondary to increased bleeding at the right arm IV site as well as at the right groin site. I opened the groin dressing and placed 4x4 compression under the medial dressing site.  We then applied hypafix tape over the wound.  I held pressure over the groin for 5 min. While the nurse checked a doppler pulse bilaterally.  PT/DP intact and strong monophasic singnals where heard.  Patient will go to IR for angio this afternoon.  Eliyah Mcshea MAUREEN PA-C

## 2012-09-15 NOTE — Progress Notes (Addendum)
[facility] [place]  Subjective  - POD #1  IR INFUSION THROMBOL ARTERIAL INITIAL (MS) [[id number] (Type: Custom)]    Patient states her left foot feels better.  Objective 102/64 65 98.1 F (36.7 C) (Oral) 17 100%  Intake/Output Summary (Last 24 hours) at 09/15/12 0725 Last data filed at 09/15/12 0600  Gross per 24 hour  Intake   1399 ml  Output   1050 ml  Net    349 ml    Doppler PT left foot is warm and sensation is intact. Doppler PT/DP right foot.  Assessment/Planning: POD #1  IR Will recheck angio today. NPO TNK thrombolytic therapy at dose of 0.4 mg/hr via infusion cath. Continue IV heparin, check heparin levels and fibrinogen every 6 hours   COLLINS, EMMA [facility] 09/15/2012 7:25 AM --  Laboratory Lab Results:  Basename 09/15/12 0550 09/15/12 0108  WBC 7.8 9.2  HGB 11.7* 11.9*  HCT 36.8 37.2  PLT 147* 141*   BMET  Basename 09/15/12 0550 09/14/12 0615  NA 138 138  K 3.8 3.5  CL 106 99  CO2 24 31  GLUCOSE 139* 110*  BUN 4* 10  CREATININE 0.60 0.75  CALCIUM 8.9 9.6    COAG Lab Results  Component Value Date   INR 1.13 09/14/2012   INR 1.06 09/14/2012   INR 1.0 06/22/2008   No results found for this basename: PTT    Antibiotics Anti-infectives     Start     Dose/Rate Route Frequency Ordered Stop   09/14/12 0159   cefUROXime (ZINACEF) 1.5 g in dextrose 5 % 50 mL IVPB        1.5 g 100 mL/hr over 30 Minutes Intravenous 60 min pre-op 09/14/12 0159           Addendum  I have independently interviewed and examined the patient, and I agree with the physician assistant's findings.  Pt already sx improved though period of increased pain overnight.   Palpable L PT today.  I suspect some degree of successful thrombolysis of this L fem-AK BPG.  Wait catheter check today.  Leonides Sake, MD [facility] [place] Office: [phone] Pager: [phone]  09/15/2012, 7:58 AM

## 2012-09-15 NOTE — Progress Notes (Signed)
ANTICOAGULATION CONSULT NOTE - Follow Up Consult  Pharmacy Consult for Heparin Indication: Thrombosed Left Fem-Pop, currently with TNKase infusion thrombolysis  Patient Measurements: Height: 5\' 4"  (162.6 cm) Weight: 103 lb 2.8 oz (46.8 kg) IBW/kg (Calculated) : 54.7  Heparin Dosing Weight: 46.8kg  Vital Signs: Temp: 98.6 F (37 C) (11/01 0800) Temp src: Oral (11/01 0800) BP: 95/53 mmHg (11/01 0830) Pulse Rate: 75  (11/01 0830)  Labs:  Alvira Philips 09/15/12 0939 09/15/12 0550 09/15/12 0108 09/14/12 2321 09/14/12 1836 09/14/12 1316 09/14/12 0615 09/14/12 0015 09/13/12 2336  HGB -- 11.7* 11.9* -- -- -- -- -- --  HCT -- 36.8 37.2 -- 38.9 -- -- -- --  PLT -- 147* 141* -- 192 -- -- -- --  APTT -- -- -- -- -- 47* -- -- --  LABPROT -- -- -- -- -- -- 14.3 13.7 --  INR -- -- -- -- -- -- 1.13 1.06 --  HEPARINUNFRC 0.34 -- -- <0.10* 0.12* -- -- -- --  CREATININE -- 0.60 -- -- -- -- 0.75 -- 1.00  CKTOTAL -- -- -- -- -- -- -- -- --  CKMB -- -- -- -- -- -- -- -- --  TROPONINI -- -- -- -- -- -- -- -- --    Estimated Creatinine Clearance: 51.8 ml/min (by C-G formula based on Cr of 0.6).  Assessment: 67yo female undergoing infusion thrombolysis with TNKase and IV heparin with plan for recheck angio today. Heparin level at goal (0.34) on 700 units/hr. No problem with line / infusion per RN. No bleeding issues noted Goal of Therapy:  Heparin level 0.2-0.5 with TNKase Monitor platelets by anticoagulation protocol: Yes   Plan:  1. Continue heparin at 700 units/hr.  2. Heparin level in 6 hours.  3. Follow plan post angio  Sheppard Coil, PharmD 09/15/12, 11:33 AM

## 2012-09-15 NOTE — ED Notes (Addendum)
No sedation needed.   Angiogram normal

## 2012-09-15 NOTE — Progress Notes (Signed)
Patient ID: Kristin Knight, female   DOB: 02/21/1946, 66 y.o.   MRN: [id number]   Left fem pop graft occlusion. Thrombolysis started 10/31 Asked to check Rt groin site catheter for increased oozing   Dressing saturated with blood at Rt groin site Dressing removed and site examined No hematoma NT Oozing barely visible at site B foot pulses +by doppler Feet warm and no swelling noted Good sensation  New dressing applied IR to call for pt asap for recheck

## 2012-09-15 NOTE — Progress Notes (Signed)
ANTICOAGULATION CONSULT NOTE - Follow Up Consult  Pharmacy Consult for Heparin Indication: Thrombosed Left Fem-Pop, s/p TNKase infusion thrombolysis  Patient Measurements: Height: 5\' 4"  (162.6 cm) Weight: 103 lb 2.8 oz (46.8 kg) IBW/kg (Calculated) : 54.7  Heparin Dosing Weight: 46.8kg  Vital Signs: Temp: 98.3 F (36.8 C) (11/01 1932) Temp src: Oral (11/01 1932) BP: 120/63 mmHg (11/01 2000) Pulse Rate: 67  (11/01 1800)  Labs:  Basename 09/15/12 1821 09/15/12 0939 09/15/12 0550 09/15/12 0108 09/14/12 2321 09/14/12 1836 09/14/12 1316 09/14/12 0615 09/14/12 0015 09/13/12 2336  HGB -- -- 11.7* 11.9* -- -- -- -- -- --  HCT -- -- 36.8 37.2 -- 38.9 -- -- -- --  PLT -- -- 147* 141* -- 192 -- -- -- --  APTT -- -- -- -- -- -- 47* -- -- --  LABPROT -- -- -- -- -- -- -- 14.3 13.7 --  INR -- -- -- -- -- -- -- 1.13 1.06 --  HEPARINUNFRC 0.21* 0.34 -- -- <0.10* -- -- -- -- --  CREATININE -- -- 0.60 -- -- -- -- 0.75 -- 1.00  CKTOTAL -- -- -- -- -- -- -- -- -- --  CKMB -- -- -- -- -- -- -- -- -- --  TROPONINI -- -- -- -- -- -- -- -- -- --    Estimated Creatinine Clearance: 51.8 ml/min (by C-G formula based on Cr of 0.6).  Assessment: 66yo female s/p infusion thrombolysis with TNKase and IV heparin. Follow up angiogram shows patent vessels with blood flow to the foot.  TNKase finished at 1800 11/1. Heparin drip 700 uts/hr HL 0.21 within goal but drop from earlier today.  No bleeding issues noted Goal of Therapy:  Heparin level 0.2-0.5 with TNKase Monitor platelets by anticoagulation protocol: Yes   Plan:  1. Continue heparin at 700 units/hr.  2. Recheck with AML  Leota Sauers Pharm.D. CPP, BCPS Clinical Pharmacist [phone] 09/15/2012 8:18 PM
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
## 2012-09-15 NOTE — Procedures (Signed)
F/U ANGIO AFTER OVERNIGHT TNK LYSIS LT FEM ABOVE KNEE POP CONDUIT IS PATENT NO SIG RESIDUAL THROMBUS 2 VESSEL R/O MIN NARROWING OF THE PROX ANAST.  DISTAL ANST PATENT ALSO WITH BRISK FLOW TO THE FOOT FULL REPORT IN PACS

## 2012-09-16 DIAGNOSIS — I743 Embolism and thrombosis of arteries of the lower extremities: Secondary | ICD-10-CM | POA: Diagnosis not present

## 2012-09-16 DIAGNOSIS — T82598A Other mechanical complication of other cardiac and vascular devices and implants, initial encounter: Secondary | ICD-10-CM | POA: Diagnosis not present

## 2012-09-16 DIAGNOSIS — N1330 Unspecified hydronephrosis: Secondary | ICD-10-CM | POA: Diagnosis not present

## 2012-09-16 LAB — CBC
HCT: 30.9 % — ABNORMAL LOW (ref 36.0–46.0)
MCHC: 31.7 g/dL (ref 30.0–36.0)
MCV: 89.8 fL (ref 78.0–100.0)
Platelets: 120 10*3/uL — ABNORMAL LOW (ref 150–400)
RDW: 13.8 % (ref 11.5–15.5)
WBC: 5.3 10*3/uL (ref 4.0–10.5)

## 2012-09-16 LAB — FIBRINOGEN
Fibrinogen: 254 mg/dL (ref 204–475)
Fibrinogen: 285 mg/dL (ref 204–475)
Fibrinogen: 315 mg/dL (ref 204–475)

## 2012-09-16 MED ORDER — PAROXETINE HCL 20 MG PO TABS
20.0000 mg | ORAL_TABLET | Freq: Every day | ORAL | Status: DC
  Administered 2012-09-16 – 2012-09-18 (×3): 20 mg via ORAL
  Filled 2012-09-16 (×3): qty 1

## 2012-09-16 MED ORDER — ATROPINE SULFATE 1 MG/ML IJ SOLN
INTRAMUSCULAR | Status: AC
  Filled 2012-09-16: qty 1

## 2012-09-16 MED ORDER — LISINOPRIL 20 MG PO TABS
20.0000 mg | ORAL_TABLET | Freq: Every day | ORAL | Status: DC
  Administered 2012-09-16 – 2012-09-18 (×3): 20 mg via ORAL
  Filled 2012-09-16 (×3): qty 1

## 2012-09-16 MED ORDER — ALBUTEROL SULFATE HFA 108 (90 BASE) MCG/ACT IN AERS: 2.0000 | INHALATION_SPRAY | Freq: Four times a day (QID) | RESPIRATORY_TRACT | Status: DC | PRN

## 2012-09-16 MED ORDER — OXYCODONE HCL 5 MG PO TABS
5.0000 mg | ORAL_TABLET | Freq: Four times a day (QID) | ORAL | Status: DC | PRN
  Administered 2012-09-17 – 2012-09-18 (×3): 5 mg via ORAL
  Filled 2012-09-16 (×3): qty 1

## 2012-09-16 MED ORDER — TRAMADOL HCL 50 MG PO TABS: 50.0000 mg | ORAL_TABLET | Freq: Four times a day (QID) | ORAL | Status: DC | PRN

## 2012-09-16 MED ORDER — TRIAMTERENE-HCTZ 37.5-25 MG PO TABS
1.0000 | ORAL_TABLET | Freq: Every day | ORAL | Status: DC
  Administered 2012-09-16 – 2012-09-18 (×3): 1 via ORAL
  Filled 2012-09-16 (×3): qty 1

## 2012-09-16 MED ORDER — FLUTICASONE PROPIONATE 50 MCG/ACT NA SUSP
2.0000 | Freq: Every day | NASAL | Status: DC
  Administered 2012-09-16 – 2012-09-18 (×3): 2 via NASAL
  Filled 2012-09-16: qty 16

## 2012-09-16 NOTE — Progress Notes (Signed)
Subjective: Interval History: none..   Objective: Vital signs in last 24 hours: Temp:  [98.1 F (36.7 C)-99.3 F (37.4 C)] 99.2 F (37.3 C) (11/02 0736) Pulse Rate:  [54-67] 54  (11/02 0800) Resp:  [12-21] 18  (11/02 0800) BP: (90-158)/(58-78) 139/66 mmHg (11/02 0800) SpO2:  [94 %-100 %] 100 % (11/02 0800)  Intake/Output from previous day: 11/01 0701 - 11/02 0700 In: 3067.1 [P.O.:240; I.V.:2507.1; IV Piggyback:320] Out: 2330 [Urine:2330] Intake/Output this shift: Total I/O In: 129.5 [I.V.:129.5] Out: 125 [Urine:125]  Right femoral sheath in place with no hematoma. Palpable left popliteal pulse  Lab Results:  Basename 09/16/12 0443 09/15/12 0550  WBC 5.3 7.8  HGB 9.8* 11.7*  HCT 30.9* 36.8  PLT 120* 147*   BMET  Basename 09/15/12 0550 09/14/12 0615  NA 138 138  K 3.8 3.5  CL 106 99  CO2 24 31  GLUCOSE 139* 110*  BUN 4* 10  CREATININE 0.60 0.75  CALCIUM 8.9 9.6    Studies/Results: Ir Angiogram Extremity Left  09/14/2012  *RADIOLOGY REPORT*  Clinical Data: Occlusion of left lower extremity femoral to popliteal bypass graft.  1.  ULTRASOUND GUIDANCE FOR VASCULAR ACCESS OF THE RIGHT COMMON FEMORAL ARTERY 2.  LEFT LOWER EXTREMITY ARTERIOGRAPHY 3.  TRANSCATHETER ARTERIAL THROMBOLYTIC THERAPY OF THE LEFT LOWER EXTREMITY  Comparison:  None  Sedation: 2.0 mg IV Versed; 50 mcg IV Fentanyl.  Total Moderate Sedation Time: 39 minutes.  Contrast:  60 ml Visipaque 320  Fluoroscopy Time: 8.2 minutes.  Procedure:  The procedure, risks, benefits, and alternatives were explained to the patient.  Questions regarding the procedure were encouraged and answered.  The patient understands and consents to the procedure.  The right groin was prepped with Betadine in a sterile fashion, and a sterile drape was applied covering the operative field.  A sterile gown and sterile gloves were used for the procedure.  Local anesthesia was provided with 1% Lidocaine.  The right common femoral artery was  accessed under direct ultrasound guidance with a micropuncture set.  Ultrasound image documentation was performed.  A 5-French sheath was initially placed over a guidewire.  Attempt was made to catheterize the left common iliac artery with a 5-French Cobra catheter.  Eventually, the bifurcation was crossed with a 5-French Sos catheter.  The catheter was advanced into the left external iliac artery and arteriography performed of the left lower extremity.  Runoff was evaluated to the level of the proximal foot.  The diagnostic catheter and sheath were removed.  A new 6-French, 45 cm sheath was then advanced over the aortic bifurcation and to the level of the left external iliac artery. Catheterization of the femoral to above knee popliteal bypass graft was then performed with a 5-French catheter. Contrast injection was performed in the distal graft.  The catheter was further advanced over a guidewire to the level of the native popliteal artery and contrast injection performed.  A 5-French infusion catheter was then advanced over a guide wire into the graft.  Infusion length spans 40 cm and extends from just above the graft to the level of the distal graft anastomosis. Thrombolytic infusion therapy was begun with Tenecteplase at a dose of 0.4 mg per hour.  Saline was also begun through the sidearm of the femoral sheath.  Complications: None  Findings: Arteriography confirms occlusion of the bypass graft at its origin.  There is no significant iliac inflow disease.  The profunda femoral artery and collaterals eventually reconstitute the distal SFA at the abductor  hiatus.  Distal SFA and proximal popliteal artery show moderate, diffuse disease.  The popliteal artery shows mild narrowing across the knee joint approaching 25%. Below the knee, the anterior tibial artery is occluded proximally. A patent posterior tibial artery continues to the foot.  The peroneal artery is small in caliber and can be followed to the level of  the ankle.  The infusion catheter was able to be easily advanced through the graft.  Distal contrast injection at the level of the distal graft demonstrates a probable tight stricture at the distal graft anastomosis.  IMPRESSION:  1.  Left lower extremity arteriography confirms occlusion of the left femoral to popliteal bypass graft.  There is collateral reconstitution of the SFA with open popliteal artery and dominant posterior tibial artery runoff demonstrated.  The peroneal artery is also open. 2.  Transcatheter thrombolytic therapy was begun through an infusion catheter positioned across the entire length of the left lower extremity bypass graft.  Infusion will be begun at a rate of 0.4 mg per hour of Tenecteplase.  Concomitant IV heparin will also be continued through a peripheral IV.  Follow-up angiography will be performed tomorrow.   Original Report Authenticated By: Irish Lack, M.D.    Ir US Guide Vasc Access Right  09/14/2012  *RADIOLOGY REPORT*  Clinical Data: Occlusion of left lower extremity femoral to popliteal bypass graft.  1.  ULTRASOUND GUIDANCE FOR VASCULAR ACCESS OF THE RIGHT COMMON FEMORAL ARTERY 2.  LEFT LOWER EXTREMITY ARTERIOGRAPHY 3.  TRANSCATHETER ARTERIAL THROMBOLYTIC THERAPY OF THE LEFT LOWER EXTREMITY  Comparison:  None  Sedation: 2.0 mg IV Versed; 50 mcg IV Fentanyl.  Total Moderate Sedation Time: 39 minutes.  Contrast:  60 ml Visipaque 320  Fluoroscopy Time: 8.2 minutes.  Procedure:  The procedure, risks, benefits, and alternatives were explained to the patient.  Questions regarding the procedure were encouraged and answered.  The patient understands and consents to the procedure.  The right groin was prepped with Betadine in a sterile fashion, and a sterile drape was applied covering the operative field.  A sterile gown and sterile gloves were used for the procedure.  Local anesthesia was provided with 1% Lidocaine.  The right common femoral artery was accessed under direct  ultrasound guidance with a micropuncture set.  Ultrasound image documentation was performed.  A 5-French sheath was initially placed over a guidewire.  Attempt was made to catheterize the left common iliac artery with a 5-French Cobra catheter.  Eventually, the bifurcation was crossed with a 5-French Sos catheter.  The catheter was advanced into the left external iliac artery and arteriography performed of the left lower extremity.  Runoff was evaluated to the level of the proximal foot.  The diagnostic catheter and sheath were removed.  A new 6-French, 45 cm sheath was then advanced over the aortic bifurcation and to the level of the left external iliac artery. Catheterization of the femoral to above knee popliteal bypass graft was then performed with a 5-French catheter. Contrast injection was performed in the distal graft.  The catheter was further advanced over a guidewire to the level of the native popliteal artery and contrast injection performed.  A 5-French infusion catheter was then advanced over a guide wire into the graft.  Infusion length spans 40 cm and extends from just above the graft to the level of the distal graft anastomosis. Thrombolytic infusion therapy was begun with Tenecteplase at a dose of 0.4 mg per hour.  Saline was also begun through the sidearm  of the femoral sheath.  Complications: None  Findings: Arteriography confirms occlusion of the bypass graft at its origin.  There is no significant iliac inflow disease.  The profunda femoral artery and collaterals eventually reconstitute the distal SFA at the abductor hiatus.  Distal SFA and proximal popliteal artery show moderate, diffuse disease.  The popliteal artery shows mild narrowing across the knee joint approaching 25%. Below the knee, the anterior tibial artery is occluded proximally. A patent posterior tibial artery continues to the foot.  The peroneal artery is small in caliber and can be followed to the level of the ankle.  The  infusion catheter was able to be easily advanced through the graft.  Distal contrast injection at the level of the distal graft demonstrates a probable tight stricture at the distal graft anastomosis.  IMPRESSION:  1.  Left lower extremity arteriography confirms occlusion of the left femoral to popliteal bypass graft.  There is collateral reconstitution of the SFA with open popliteal artery and dominant posterior tibial artery runoff demonstrated.  The peroneal artery is also open. 2.  Transcatheter thrombolytic therapy was begun through an infusion catheter positioned across the entire length of the left lower extremity bypass graft.  Infusion will be begun at a rate of 0.4 mg per hour of Tenecteplase.  Concomitant IV heparin will also be continued through a peripheral IV.  Follow-up angiography will be performed tomorrow.   Original Report Authenticated By: Irish Lack, M.D.    Dg Chest Portable 1 View  09/14/2012  *RADIOLOGY REPORT*  Clinical Data: Preop for surgery in the morning.  PORTABLE CHEST - 1 VIEW  Comparison: Two-view chest 08/15/2010  Findings: The heart size is normal.  Emphysematous changes are again noted.  Remote left-sided rib fractures are evident.  No focal airspace disease is present.  IMPRESSION:  1.  Emphysema. 2.  No acute cardiopulmonary disease.   Original Report Authenticated By: Jamesetta Orleans. MATTERN, M.D.    Ir Infusion Thrombol Arterial Initial (ms)  09/14/2012  *RADIOLOGY REPORT*  Clinical Data: Occlusion of left lower extremity femoral to popliteal bypass graft.  1.  ULTRASOUND GUIDANCE FOR VASCULAR ACCESS OF THE RIGHT COMMON FEMORAL ARTERY 2.  LEFT LOWER EXTREMITY ARTERIOGRAPHY 3.  TRANSCATHETER ARTERIAL THROMBOLYTIC THERAPY OF THE LEFT LOWER EXTREMITY  Comparison:  None  Sedation: 2.0 mg IV Versed; 50 mcg IV Fentanyl.  Total Moderate Sedation Time: 39 minutes.  Contrast:  60 ml Visipaque 320  Fluoroscopy Time: 8.2 minutes.  Procedure:  The procedure, risks, benefits,  and alternatives were explained to the patient.  Questions regarding the procedure were encouraged and answered.  The patient understands and consents to the procedure.  The right groin was prepped with Betadine in a sterile fashion, and a sterile drape was applied covering the operative field.  A sterile gown and sterile gloves were used for the procedure.  Local anesthesia was provided with 1% Lidocaine.  The right common femoral artery was accessed under direct ultrasound guidance with a micropuncture set.  Ultrasound image documentation was performed.  A 5-French sheath was initially placed over a guidewire.  Attempt was made to catheterize the left common iliac artery with a 5-French Cobra catheter.  Eventually, the bifurcation was crossed with a 5-French Sos catheter.  The catheter was advanced into the left external iliac artery and arteriography performed of the left lower extremity.  Runoff was evaluated to the level of the proximal foot.  The diagnostic catheter and sheath were removed.  A new 6-French, 45  cm sheath was then advanced over the aortic bifurcation and to the level of the left external iliac artery. Catheterization of the femoral to above knee popliteal bypass graft was then performed with a 5-French catheter. Contrast injection was performed in the distal graft.  The catheter was further advanced over a guidewire to the level of the native popliteal artery and contrast injection performed.  A 5-French infusion catheter was then advanced over a guide wire into the graft.  Infusion length spans 40 cm and extends from just above the graft to the level of the distal graft anastomosis. Thrombolytic infusion therapy was begun with Tenecteplase at a dose of 0.4 mg per hour.  Saline was also begun through the sidearm of the femoral sheath.  Complications: None  Findings: Arteriography confirms occlusion of the bypass graft at its origin.  There is no significant iliac inflow disease.  The profunda  femoral artery and collaterals eventually reconstitute the distal SFA at the abductor hiatus.  Distal SFA and proximal popliteal artery show moderate, diffuse disease.  The popliteal artery shows mild narrowing across the knee joint approaching 25%. Below the knee, the anterior tibial artery is occluded proximally. A patent posterior tibial artery continues to the foot.  The peroneal artery is small in caliber and can be followed to the level of the ankle.  The infusion catheter was able to be easily advanced through the graft.  Distal contrast injection at the level of the distal graft demonstrates a probable tight stricture at the distal graft anastomosis.  IMPRESSION:  1.  Left lower extremity arteriography confirms occlusion of the left femoral to popliteal bypass graft.  There is collateral reconstitution of the SFA with open popliteal artery and dominant posterior tibial artery runoff demonstrated.  The peroneal artery is also open. 2.  Transcatheter thrombolytic therapy was begun through an infusion catheter positioned across the entire length of the left lower extremity bypass graft.  Infusion will be begun at a rate of 0.4 mg per hour of Tenecteplase.  Concomitant IV heparin will also be continued through a peripheral IV.  Follow-up angiography will be performed tomorrow.   Original Report Authenticated By: Irish Lack, M.D.    Ir Rande Lawman F/u Eval Art/ven Final Day (ms)  09/15/2012  *RADIOLOGY REPORT*  Clinical Data: Peripheral vascular disease, status post left femoral to above knee bypass overnight thrombolysis  FOLLOW-UP LEFT LOWER EXTREMITY ANGIOGRAM  Date:  09/15/2012 16:40:00  Radiologist:  Judie Petit. Ruel Favors, M.D.  Medications:  1% lidocaine locally  Guidance:  Fluoroscopic  Fluoroscopy time:  1.9 minutes  Sedation time:  None.  Contrast volume:  30 ml Visipaque 320  Complications:  No immediate  PROCEDURE/FINDINGS:  Informed consent was obtained from the patient following explanation of the  procedure, risks, benefits and alternatives. The patient understands, agrees and consents for the procedure. All questions were addressed.  A time out was performed.  Maximal barrier sterile technique utilized including caps, mask, sterile gowns, sterile gloves, large sterile drape, hand hygiene, and betadine  Under sterile conditions, contrast injection performed through the existing infusion catheter and the arterial sheath for followup angiogram.  Follow-up left extremity angiogram:  Left femoral to above knee popliteal bypass conduit is patent.  No significant residual thrombus.  Slight narrowing of the proximal anastomosis which is not flow-limiting, estimated less than 50%.  No intragraft significant stenosis along the thigh region.  Above-knee distal anastomosis widely patent to the popliteal artery.  Popliteal artery across the knee is patent.  Preserved two-vessel runoff via the peroneal and posterior tibial arteries.  No evidence of distal emboli.  Left anterior tibial artery tapers to occlusion in the lower calf region.  Infusion catheter removed.  Sheath exchange for a short 7-French sheath in the right groin.  This access will be maintained overnight.  Plan for sheath removal tomorrow.  IMPRESSION: Patent left femoral to above knee popliteal bypass with brisk inflow and outflow.  Preserved two-vessel runoff.  Therefore thrombolytic therapy was stopped.   Original Report Authenticated By: Judie Petit. Miles Costain, M.D.    Anti-infectives: Anti-infectives     Start     Dose/Rate Route Frequency Ordered Stop   09/14/12 0159   cefUROXime (ZINACEF) 1.5 g in dextrose 5 % 50 mL IVPB        1.5 g 100 mL/hr over 30 Minutes Intravenous 60 min pre-op 09/14/12 0159 09/15/12 2033          Assessment/Plan: s/p Procedure(s) (LRB) with comments: BYPASS GRAFT FEMORAL-POPLITEAL ARTERY (Left) - Thrombectomy of Left Femoral-Popliteal Bypass Graft Successful lysis of left femoropopliteal bypass. Right femoral sheath pulled  her interventional radiology. (Mobilize.   LOS: 3 days   Esvin Hnat 09/16/2012, 8:36 AM

## 2012-09-16 NOTE — Progress Notes (Signed)
Sheath removed from right groin per orders. Manual pressure held for 20 minutes and then pressure dressing applied to site. Right groin is level 0 with no active bleeding or hematoma noted to site. Patient tolerated procedure well and verbalized understanding of post sheath removal instructions. Will continue to monitor site frequently for any complications.

## 2012-09-16 NOTE — Progress Notes (Signed)
ANTICOAGULATION CONSULT NOTE - Follow Up Consult  Pharmacy Consult for Heparin Indication: Thrombosed Left Fem-Pop, s/p TNKase infusion thrombolysis  Patient Measurements: Height: 5\' 4"  (162.6 cm) Weight: 103 lb 2.8 oz (46.8 kg) IBW/kg (Calculated) : 54.7  Heparin Dosing Weight: 46.8kg  Vital Signs: Temp: 99.2 F (37.3 C) (11/02 0736) Temp src: Oral (11/02 0736) BP: 158/77 mmHg (11/02 0700)  Labs:  Kristin Knight 09/16/12 0443 09/15/12 2159 09/15/12 1821 09/15/12 0550 09/15/12 0108 09/14/12 1316 09/14/12 0615 09/14/12 0015 09/13/12 2336  HGB 9.8* -- -- 11.7* -- -- -- -- --  HCT 30.9* -- -- 36.8 37.2 -- -- -- --  PLT 120* -- -- 147* 141* -- -- -- --  APTT -- -- -- -- -- 47* -- -- --  LABPROT -- -- -- -- -- -- 14.3 13.7 --  INR -- -- -- -- -- -- 1.13 1.06 --  HEPARINUNFRC 0.47 0.18* 0.21* -- -- -- -- -- --  CREATININE -- -- -- 0.60 -- -- 0.75 -- 1.00  CKTOTAL -- -- -- -- -- -- -- -- --  CKMB -- -- -- -- -- -- -- -- --  TROPONINI -- -- -- -- -- -- -- -- --    Estimated Creatinine Clearance: 51.8 ml/min (by C-G formula based on Cr of 0.6).  Home medications: Prescriptions prior to admission  Medication Sig Dispense Refill  . fluticasone (FLONASE) 50 MCG/ACT nasal spray Place 2 sprays into the nose daily.  16 g  2  . lisinopril (PRINIVIL,ZESTRIL) 20 MG tablet Take 1 tablet (20 mg total) by mouth daily.  31 tablet  5  . PARoxetine (PAXIL) 20 MG tablet Take 1 tablet (20 mg total) by mouth daily.  30 tablet  11  . traMADol (ULTRAM) 50 MG tablet Take 1 tablet (50 mg total) by mouth every 6 (six) hours as needed for pain.  60 tablet  1  . triamterene-hydrochlorothiazide (MAXZIDE-25) 37.5-25 MG per tablet Take 1 each (1 tablet total) by mouth daily.  30 tablet  11  . albuterol (PROVENTIL HFA;VENTOLIN HFA) 108 (90 BASE) MCG/ACT inhaler Inhale 2 puffs into the lungs every 6 (six) hours as needed for wheezing.  1 Inhaler  5     Assessment: 66yo female admitted 09/13/2012  With  claudication now s/p infusion thrombolysis with TNKase and IV heparin.  Pharmacy consulted to dose heparin.  Events 11/2: Follow up angiogram shows patent vessels with blood flow to the foot.  TNKase finished at 1800 11/1. No bleeding issues noted; Groin level 0, extremity warm   Anticoagulation:Thrombosed Left fem-pop BPG--heparin at goal (0.3) on 800/hr Follow need for long term anticoag  Infectious Disease: Rx may adjust for abx dose, no infxn present, afb, wbc normal  Cardiovascular:hx HTN, bp now above goal 90-150s, home meds not yet resumed (acei/maxzide), NSR HR 60-70   Gastrointestinal / Nutrition: PPI, NPO  Neurology: chronic back pain - pain appear  Nephrology: scr likely at baseline now 0.6, lytes ok  Pulmonary: COPD, on RA, home meds not resumed  Hematology / Oncology: hx benign neopasm of kidney;  h/h continues to  trend down, plt declining 231>>120 past 72h; 4T score is  3 (low), due primarily to timing of drop and other reasons for platelet fall, will continue heparin  PTA Medication Issues: med rec not addressed yet (lisinopril, paxil, maxzide, flonase)  Best Practices: DVT Px: IV heparin   Goal of Therapy:  Heparin level 0.3-0.7 Monitor platelets by anticoagulation protocol: Yes   Plan:  1.  Continue heparin to 800 units/hr.  2. Recheck with a.m. Labs 3. Follow up plan for long term anticoagulation   Thank you for allowing pharmacy to be a part of this patients care team.  Lovenia Kim Pharm.D., BCPS Clinical Pharmacist 09/16/2012 8:03 AM Pager: [phone] Phone: [phone]

## 2012-09-16 NOTE — Progress Notes (Signed)
Subjective: Pt doing well; no new c/o  Objective: Vital signs in last 24 hours: Temp:  [98.1 F (36.7 C)-99.3 F (37.4 C)] 99.2 F (37.3 C) (11/02 0736) Pulse Rate:  [54-67] 57  (11/02 0900) Resp:  [12-21] 16  (11/02 0900) BP: (92-158)/(58-78) 125/74 mmHg (11/02 0900) SpO2:  [94 %-100 %] 98 % (11/02 0900) Last BM Date: 09/13/12  Intake/Output from previous day: 11/01 0701 - 11/02 0700 In: 3067.1 [P.O.:240; I.V.:2507.1; IV Piggyback:320] Out: 2330 [Urine:2330] Intake/Output this shift: Total I/O In: 377.5 [P.O.:120; I.V.:257.5] Out: 125 [Urine:125]  Rt femoral sheath in place, mildly tender to palpation, no hematoma, intact rt distal pulses, rt foot warm, sens intact; left foot warm, sens intact, palpable left PT pulse, + doppler left DP pulse  Lab Results:   [facility] 09/16/12 0443 09/15/12 0550  WBC 5.3 7.8  HGB 9.8* 11.7*  HCT 30.9* 36.8  PLT 120* 147*   BMET  Basename 09/15/12 0550 09/14/12 0615  NA 138 138  K 3.8 3.5  CL 106 99  CO2 24 31  GLUCOSE 139* 110*  BUN 4* 10  CREATININE 0.60 0.75  CALCIUM 8.9 9.6   PT/INR  Basename 09/14/12 0615 09/14/12 0015  LABPROT 14.3 13.7  INR 1.13 1.06   ABG No results found for this basename: PHART:2,PCO2:2,PO2:2,HCO3:2 in the last 72 hours  Studies/Results: Ir Angiogram Extremity Left  09/14/2012  *RADIOLOGY REPORT*  Clinical Data: Occlusion of left lower extremity femoral to popliteal bypass graft.  1.  ULTRASOUND GUIDANCE FOR VASCULAR ACCESS OF THE RIGHT COMMON FEMORAL ARTERY 2.  LEFT LOWER EXTREMITY ARTERIOGRAPHY 3.  TRANSCATHETER ARTERIAL THROMBOLYTIC THERAPY OF THE LEFT LOWER EXTREMITY  Comparison:  None  Sedation: 2.0 mg IV Versed; 50 mcg IV Fentanyl.  Total Moderate Sedation Time: 39 minutes.  Contrast:  60 ml Visipaque 320  Fluoroscopy Time: 8.2 minutes.  Procedure:  The procedure, risks, benefits, and alternatives were explained to the patient.  Questions regarding the procedure were encouraged and answered.   The patient understands and consents to the procedure.  The right groin was prepped with Betadine in a sterile fashion, and a sterile drape was applied covering the operative field.  A sterile gown and sterile gloves were used for the procedure.  Local anesthesia was provided with 1% Lidocaine.  The right common femoral artery was accessed under direct ultrasound guidance with a micropuncture set.  Ultrasound image documentation was performed.  A 5-French sheath was initially placed over a guidewire.  Attempt was made to catheterize the left common iliac artery with a 5-French Cobra catheter.  Eventually, the bifurcation was crossed with a 5-French Sos catheter.  The catheter was advanced into the left external iliac artery and arteriography performed of the left lower extremity.  Runoff was evaluated to the level of the proximal foot.  The diagnostic catheter and sheath were removed.  A new 6-French, 45 cm sheath was then advanced over the aortic bifurcation and to the level of the left external iliac artery. Catheterization of the femoral to above knee popliteal bypass graft was then performed with a 5-French catheter. Contrast injection was performed in the distal graft.  The catheter was further advanced over a guidewire to the level of the native popliteal artery and contrast injection performed.  A 5-French infusion catheter was then advanced over a guide wire into the graft.  Infusion length spans 40 cm and extends from just above the graft to the level of the distal graft anastomosis. Thrombolytic infusion therapy was begun  with Tenecteplase at a dose of 0.4 mg per hour.  Saline was also begun through the sidearm of the femoral sheath.  Complications: None  Findings: Arteriography confirms occlusion of the bypass graft at its origin.  There is no significant iliac inflow disease.  The profunda femoral artery and collaterals eventually reconstitute the distal SFA at the abductor hiatus.  Distal SFA and  proximal popliteal artery show moderate, diffuse disease.  The popliteal artery shows mild narrowing across the knee joint approaching 25%. Below the knee, the anterior tibial artery is occluded proximally. A patent posterior tibial artery continues to the foot.  The peroneal artery is small in caliber and can be followed to the level of the ankle.  The infusion catheter was able to be easily advanced through the graft.  Distal contrast injection at the level of the distal graft demonstrates a probable tight stricture at the distal graft anastomosis.  IMPRESSION:  1.  Left lower extremity arteriography confirms occlusion of the left femoral to popliteal bypass graft.  There is collateral reconstitution of the SFA with open popliteal artery and dominant posterior tibial artery runoff demonstrated.  The peroneal artery is also open. 2.  Transcatheter thrombolytic therapy was begun through an infusion catheter positioned across the entire length of the left lower extremity bypass graft.  Infusion will be begun at a rate of 0.4 mg per hour of Tenecteplase.  Concomitant IV heparin will also be continued through a peripheral IV.  Follow-up angiography will be performed tomorrow.   Original Report Authenticated By: Irish Lack, M.D.    Ir US Guide Vasc Access Right  09/14/2012  *RADIOLOGY REPORT*  Clinical Data: Occlusion of left lower extremity femoral to popliteal bypass graft.  1.  ULTRASOUND GUIDANCE FOR VASCULAR ACCESS OF THE RIGHT COMMON FEMORAL ARTERY 2.  LEFT LOWER EXTREMITY ARTERIOGRAPHY 3.  TRANSCATHETER ARTERIAL THROMBOLYTIC THERAPY OF THE LEFT LOWER EXTREMITY  Comparison:  None  Sedation: 2.0 mg IV Versed; 50 mcg IV Fentanyl.  Total Moderate Sedation Time: 39 minutes.  Contrast:  60 ml Visipaque 320  Fluoroscopy Time: 8.2 minutes.  Procedure:  The procedure, risks, benefits, and alternatives were explained to the patient.  Questions regarding the procedure were encouraged and answered.  The patient  understands and consents to the procedure.  The right groin was prepped with Betadine in a sterile fashion, and a sterile drape was applied covering the operative field.  A sterile gown and sterile gloves were used for the procedure.  Local anesthesia was provided with 1% Lidocaine.  The right common femoral artery was accessed under direct ultrasound guidance with a micropuncture set.  Ultrasound image documentation was performed.  A 5-French sheath was initially placed over a guidewire.  Attempt was made to catheterize the left common iliac artery with a 5-French Cobra catheter.  Eventually, the bifurcation was crossed with a 5-French Sos catheter.  The catheter was advanced into the left external iliac artery and arteriography performed of the left lower extremity.  Runoff was evaluated to the level of the proximal foot.  The diagnostic catheter and sheath were removed.  A new 6-French, 45 cm sheath was then advanced over the aortic bifurcation and to the level of the left external iliac artery. Catheterization of the femoral to above knee popliteal bypass graft was then performed with a 5-French catheter. Contrast injection was performed in the distal graft.  The catheter was further advanced over a guidewire to the level of the native popliteal artery and contrast injection  performed.  A 5-French infusion catheter was then advanced over a guide wire into the graft.  Infusion length spans 40 cm and extends from just above the graft to the level of the distal graft anastomosis. Thrombolytic infusion therapy was begun with Tenecteplase at a dose of 0.4 mg per hour.  Saline was also begun through the sidearm of the femoral sheath.  Complications: None  Findings: Arteriography confirms occlusion of the bypass graft at its origin.  There is no significant iliac inflow disease.  The profunda femoral artery and collaterals eventually reconstitute the distal SFA at the abductor hiatus.  Distal SFA and proximal popliteal  artery show moderate, diffuse disease.  The popliteal artery shows mild narrowing across the knee joint approaching 25%. Below the knee, the anterior tibial artery is occluded proximally. A patent posterior tibial artery continues to the foot.  The peroneal artery is small in caliber and can be followed to the level of the ankle.  The infusion catheter was able to be easily advanced through the graft.  Distal contrast injection at the level of the distal graft demonstrates a probable tight stricture at the distal graft anastomosis.  IMPRESSION:  1.  Left lower extremity arteriography confirms occlusion of the left femoral to popliteal bypass graft.  There is collateral reconstitution of the SFA with open popliteal artery and dominant posterior tibial artery runoff demonstrated.  The peroneal artery is also open. 2.  Transcatheter thrombolytic therapy was begun through an infusion catheter positioned across the entire length of the left lower extremity bypass graft.  Infusion will be begun at a rate of 0.4 mg per hour of Tenecteplase.  Concomitant IV heparin will also be continued through a peripheral IV.  Follow-up angiography will be performed tomorrow.   Original Report Authenticated By: Irish Lack, M.D.    Ir Infusion Thrombol Arterial Initial (ms)  09/14/2012  *RADIOLOGY REPORT*  Clinical Data: Occlusion of left lower extremity femoral to popliteal bypass graft.  1.  ULTRASOUND GUIDANCE FOR VASCULAR ACCESS OF THE RIGHT COMMON FEMORAL ARTERY 2.  LEFT LOWER EXTREMITY ARTERIOGRAPHY 3.  TRANSCATHETER ARTERIAL THROMBOLYTIC THERAPY OF THE LEFT LOWER EXTREMITY  Comparison:  None  Sedation: 2.0 mg IV Versed; 50 mcg IV Fentanyl.  Total Moderate Sedation Time: 39 minutes.  Contrast:  60 ml Visipaque 320  Fluoroscopy Time: 8.2 minutes.  Procedure:  The procedure, risks, benefits, and alternatives were explained to the patient.  Questions regarding the procedure were encouraged and answered.  The patient understands  and consents to the procedure.  The right groin was prepped with Betadine in a sterile fashion, and a sterile drape was applied covering the operative field.  A sterile gown and sterile gloves were used for the procedure.  Local anesthesia was provided with 1% Lidocaine.  The right common femoral artery was accessed under direct ultrasound guidance with a micropuncture set.  Ultrasound image documentation was performed.  A 5-French sheath was initially placed over a guidewire.  Attempt was made to catheterize the left common iliac artery with a 5-French Cobra catheter.  Eventually, the bifurcation was crossed with a 5-French Sos catheter.  The catheter was advanced into the left external iliac artery and arteriography performed of the left lower extremity.  Runoff was evaluated to the level of the proximal foot.  The diagnostic catheter and sheath were removed.  A new 6-French, 45 cm sheath was then advanced over the aortic bifurcation and to the level of the left external iliac artery. Catheterization of the  femoral to above knee popliteal bypass graft was then performed with a 5-French catheter. Contrast injection was performed in the distal graft.  The catheter was further advanced over a guidewire to the level of the native popliteal artery and contrast injection performed.  A 5-French infusion catheter was then advanced over a guide wire into the graft.  Infusion length spans 40 cm and extends from just above the graft to the level of the distal graft anastomosis. Thrombolytic infusion therapy was begun with Tenecteplase at a dose of 0.4 mg per hour.  Saline was also begun through the sidearm of the femoral sheath.  Complications: None  Findings: Arteriography confirms occlusion of the bypass graft at its origin.  There is no significant iliac inflow disease.  The profunda femoral artery and collaterals eventually reconstitute the distal SFA at the abductor hiatus.  Distal SFA and proximal popliteal artery show  moderate, diffuse disease.  The popliteal artery shows mild narrowing across the knee joint approaching 25%. Below the knee, the anterior tibial artery is occluded proximally. A patent posterior tibial artery continues to the foot.  The peroneal artery is small in caliber and can be followed to the level of the ankle.  The infusion catheter was able to be easily advanced through the graft.  Distal contrast injection at the level of the distal graft demonstrates a probable tight stricture at the distal graft anastomosis.  IMPRESSION:  1.  Left lower extremity arteriography confirms occlusion of the left femoral to popliteal bypass graft.  There is collateral reconstitution of the SFA with open popliteal artery and dominant posterior tibial artery runoff demonstrated.  The peroneal artery is also open. 2.  Transcatheter thrombolytic therapy was begun through an infusion catheter positioned across the entire length of the left lower extremity bypass graft.  Infusion will be begun at a rate of 0.4 mg per hour of Tenecteplase.  Concomitant IV heparin will also be continued through a peripheral IV.  Follow-up angiography will be performed tomorrow.   Original Report Authenticated By: Irish Lack, M.D.    Ir Rande Lawman F/u Eval Art/ven Final Day (ms)  09/15/2012  *RADIOLOGY REPORT*  Clinical Data: Peripheral vascular disease, status post left femoral to above knee bypass overnight thrombolysis  FOLLOW-UP LEFT LOWER EXTREMITY ANGIOGRAM  Date:  09/15/2012 16:40:00  Radiologist:  Judie Petit. Ruel Favors, M.D.  Medications:  1% lidocaine locally  Guidance:  Fluoroscopic  Fluoroscopy time:  1.9 minutes  Sedation time:  None.  Contrast volume:  30 ml Visipaque 320  Complications:  No immediate  PROCEDURE/FINDINGS:  Informed consent was obtained from the patient following explanation of the procedure, risks, benefits and alternatives. The patient understands, agrees and consents for the procedure. All questions were addressed.  A time  out was performed.  Maximal barrier sterile technique utilized including caps, mask, sterile gowns, sterile gloves, large sterile drape, hand hygiene, and betadine  Under sterile conditions, contrast injection performed through the existing infusion catheter and the arterial sheath for followup angiogram.  Follow-up left extremity angiogram:  Left femoral to above knee popliteal bypass conduit is patent.  No significant residual thrombus.  Slight narrowing of the proximal anastomosis which is not flow-limiting, estimated less than 50%.  No intragraft significant stenosis along the thigh region.  Above-knee distal anastomosis widely patent to the popliteal artery.  Popliteal artery across the knee is patent.  Preserved two-vessel runoff via the peroneal and posterior tibial arteries.  No evidence of distal emboli.  Left anterior tibial artery tapers  to occlusion in the lower calf region.  Infusion catheter removed.  Sheath exchange for a short 7-French sheath in the right groin.  This access will be maintained overnight.  Plan for sheath removal tomorrow.  IMPRESSION: Patent left femoral to above knee popliteal bypass with brisk inflow and outflow.  Preserved two-vessel runoff.  Therefore thrombolytic therapy was stopped.   Original Report Authenticated By: Judie Petit. Miles Costain, M.D.     Anti-infectives: Anti-infectives     Start     Dose/Rate Route Frequency Ordered Stop   09/14/12 0159   cefUROXime (ZINACEF) 1.5 g in dextrose 5 % 50 mL IVPB        1.5 g 100 mL/hr over 30 Minutes Intravenous 60 min pre-op 09/14/12 0159 09/15/12 2033          Assessment/Plan: s/p thrombolysis of left F-PBPG, completed 11/1. Plan is to d/c IV heparin for 2 hours then remove femoral arterial sheath. Plans d/w pt/pt's nurse.   LOS: 3 days    ALLRED,D [facility] 09/16/2012

## 2012-09-16 NOTE — Progress Notes (Signed)
Report called to [facility] on 2000. Patient eating meal and will transfer when she is finished. Will continue to monitor.

## 2012-09-17 DIAGNOSIS — I743 Embolism and thrombosis of arteries of the lower extremities: Secondary | ICD-10-CM | POA: Diagnosis not present

## 2012-09-17 LAB — FIBRINOGEN: Fibrinogen: 342 mg/dL (ref 204–475)

## 2012-09-17 LAB — CBC
Platelets: 143 10*3/uL — ABNORMAL LOW (ref 150–400)
RBC: 3.71 MIL/uL — ABNORMAL LOW (ref 3.87–5.11)
WBC: 5 10*3/uL (ref 4.0–10.5)

## 2012-09-17 LAB — HEPARIN LEVEL (UNFRACTIONATED): Heparin Unfractionated: 0.2 IU/mL — ABNORMAL LOW (ref 0.30–0.70)

## 2012-09-17 MED ORDER — WARFARIN - PHARMACIST DOSING INPATIENT
Freq: Every day | Status: DC
  Administered 2012-09-17 – 2012-09-18 (×2)

## 2012-09-17 MED ORDER — WARFARIN SODIUM 4 MG PO TABS
4.0000 mg | ORAL_TABLET | Freq: Once | ORAL | Status: AC
  Administered 2012-09-17: 4 mg via ORAL
  Filled 2012-09-17: qty 1

## 2012-09-17 MED ORDER — HEPARIN (PORCINE) IN NACL 100-0.45 UNIT/ML-% IJ SOLN
800.0000 [IU]/h | INTRAMUSCULAR | Status: DC
  Administered 2012-09-17: 800 [IU]/h via INTRAVENOUS
  Filled 2012-09-17: qty 250

## 2012-09-17 MED ORDER — COUMADIN BOOK
Freq: Once | Status: AC
  Administered 2012-09-17: 12:00:00
  Filled 2012-09-17: qty 1

## 2012-09-17 MED ORDER — HEPARIN (PORCINE) IN NACL 100-0.45 UNIT/ML-% IJ SOLN
850.0000 [IU]/h | INTRAMUSCULAR | Status: DC
  Filled 2012-09-17: qty 250

## 2012-09-17 MED ORDER — WARFARIN VIDEO
Freq: Once | Status: AC
  Administered 2012-09-17: 12:00:00

## 2012-09-17 NOTE — Progress Notes (Signed)
ANTICOAGULATION CONSULT NOTE - Follow Up Consult  Pharmacy Consult for Heparin and Coumadin Indication: Thrombosed Left Fem-Pop, s/p TNKase infusion thrombolysis  Patient Measurements: Height: 5\' 4"  (162.6 cm) Weight: 103 lb 2.8 oz (46.8 kg) IBW/kg (Calculated) : 54.7  Heparin Dosing Weight: 46.8kg  Vital Signs: Temp: 98.7 F (37.1 C) (11/03 0332) Temp src: Oral (11/03 0332) BP: 121/78 mmHg (11/03 0332) Pulse Rate: 59  (11/03 0332)  Labs:  Basename 09/17/12 0605 09/16/12 1100 09/16/12 0443 09/15/12 2159 09/15/12 0550 09/14/12 1316  HGB 10.5* -- 9.8* -- -- --  HCT 32.9* -- 30.9* -- 36.8 --  PLT 143* -- 120* -- 147* --  APTT -- -- -- -- -- 47*  LABPROT -- -- -- -- -- --  INR -- -- -- -- -- --  HEPARINUNFRC -- 0.42 0.47 0.18* -- --  CREATININE -- -- -- -- 0.60 --  CKTOTAL -- -- -- -- -- --  CKMB -- -- -- -- -- --  TROPONINI -- -- -- -- -- --    Estimated Creatinine Clearance: 51.8 ml/min (by C-G formula based on Cr of 0.6).  Home medications: Prescriptions prior to admission  Medication Sig Dispense Refill  . fluticasone (FLONASE) 50 MCG/ACT nasal spray Place 2 sprays into the nose daily.  16 g  2  . lisinopril (PRINIVIL,ZESTRIL) 20 MG tablet Take 1 tablet (20 mg total) by mouth daily.  31 tablet  5  . PARoxetine (PAXIL) 20 MG tablet Take 1 tablet (20 mg total) by mouth daily.  30 tablet  11  . traMADol (ULTRAM) 50 MG tablet Take 1 tablet (50 mg total) by mouth every 6 (six) hours as needed for pain.  60 tablet  1  . triamterene-hydrochlorothiazide (MAXZIDE-25) 37.5-25 MG per tablet Take 1 each (1 tablet total) by mouth daily.  30 tablet  11  . albuterol (PROVENTIL HFA;VENTOLIN HFA) 108 (90 BASE) MCG/ACT inhaler Inhale 2 puffs into the lungs every 6 (six) hours as needed for wheezing.  1 Inhaler  5     Assessment: 66yo female admitted 09/13/2012 with claudication now s/p infusion thrombolysis with TNKase and IV heparin.  Pharmacy consulted to dose heparin and  warfarin.  Events: Follow up angiogram on 11/2 shows patent vessels with blood flow to the foot.  TNKase finished at 1800 11/1. No bleeding issues noted; Groin level 0, extremity warm.   Anticoagulation:Thrombosed Left fem-pop BPG--to restart Heparin and bridge to Warfarin today.  She was previously therapeutic on 800 units/hr of Heparin.  Will start with a lower Warfarin dose due to low body weight and age 89.  Infectious Disease: Rx may adjust for abx dose, no infxn present, afeb, wbc normal  Cardiovascular:hx HTN, some elevated BP but improved with restart of lisinopril and maxzide  Gastrointestinal / Nutrition: PPI, NPO  Neurology: chronic back pain - pain appear  Nephrology: scr likely at baseline now 0.6, lytes ok  Pulmonary: COPD, on RA, home meds not resumed  Hematology / Oncology: hx benign neopasm of kidney;  h/h has stabilized, platelet count is slightly improved now that TNKase is finished  PTA Medication Issues: home medications restarted  Best Practices:Heparin bridge to Warfarin   Goal of Therapy:  Heparin level 0.3-0.7 INR 2-3 Monitor platelets by anticoagulation protocol: Yes   Plan:  Restart Heparin at 800 units/hr Check Heparin level 8 hours after restarting Heparin Coumadin 4mg  today Daily PT/INR  Estella Husk, Pharm.D., BCPS Clinical Pharmacist  Phone [phone] Pager [phone] 09/17/2012, 7:31 AM

## 2012-09-17 NOTE — Progress Notes (Signed)
[facility] [place]  Subjective  - BYPASS GRAFT FEMORAL-POPLITEAL ARTERY (Left) - Thrombectomy of Left Femoral-Popliteal Bypass Graft  Successful lysis of left femoropopliteal bypass. On heparin and coumadin.   Objective 121/78 59 98.7 F (37.1 C) (Oral) 18 98%  Intake/Output Summary (Last 24 hours) at 09/17/12 0926 Last data filed at 09/17/12 0300  Gross per 24 hour  Intake 1288.38 ml  Output   2875 ml  Net -1586.62 ml    Right groin clean and dry applied new dry compression dressing. Bilateral feet warm with full range of motion and sensation intact and equal.  Assessment/Planning: BYPASS GRAFT FEMORAL-POPLITEAL ARTERY (Left) - Thrombectomy of Left Femoral-Popliteal Bypass Graft  Successful lysis of left femoropopliteal bypass. On heparin and coumadin. D/C when coumadin therapeutic.    Clinton Gallant [facility] 09/17/2012 9:26 AM --  Laboratory Lab Results:  Basename 09/17/12 0605 09/16/12 0443  WBC 5.0 5.3  HGB 10.5* 9.8*  HCT 32.9* 30.9*  PLT 143* 120*   BMET  Basename 09/15/12 0550  NA 138  K 3.8  CL 106  CO2 24  GLUCOSE 139*  BUN 4*  CREATININE 0.60  CALCIUM 8.9    COAG Lab Results  Component Value Date   INR 1.13 09/14/2012   INR 1.06 09/14/2012   INR 1.0 06/22/2008   No results found for this basename: PTT    Antibiotics Anti-infectives     Start     Dose/Rate Route Frequency Ordered Stop   09/14/12 0159   cefUROXime (ZINACEF) 1.5 g in dextrose 5 % 50 mL IVPB        1.5 g 100 mL/hr over 30 Minutes Intravenous 60 min pre-op 09/14/12 0159 09/15/12 2033

## 2012-09-17 NOTE — Progress Notes (Signed)
ANTICOAGULATION CONSULT NOTE - Follow Up Consult  Pharmacy Consult for Heparin Indication: Thrombosed Left Fem-Pop, s/p TNKase infusion thrombolysis  Patient Measurements: Height: 5\' 4"  (162.6 cm) Weight: 103 lb 2.8 oz (46.8 kg) IBW/kg (Calculated) : 54.7  Heparin Dosing Weight: 46.8kg  Vital Signs: Temp: 98.9 F (37.2 C) (11/03 1346) Temp src: Oral (11/03 1346) BP: 109/72 mmHg (11/03 1346) Pulse Rate: 65  (11/03 1346)  Labs:  Basename 09/17/12 1804 09/17/12 0605 09/16/12 1100 09/16/12 0443 09/15/12 0550  HGB -- 10.5* -- 9.8* --  HCT -- 32.9* -- 30.9* 36.8  PLT -- 143* -- 120* 147*  APTT -- -- -- -- --  LABPROT -- -- -- -- --  INR -- -- -- -- --  HEPARINUNFRC 0.20* -- 0.42 0.47 --  CREATININE -- -- -- -- 0.60  CKTOTAL -- -- -- -- --  CKMB -- -- -- -- --  TROPONINI -- -- -- -- --    Estimated Creatinine Clearance: 51.8 ml/min (by C-G formula based on Cr of 0.6).  Home medications: Prescriptions prior to admission  Medication Sig Dispense Refill  . fluticasone (FLONASE) 50 MCG/ACT nasal spray Place 2 sprays into the nose daily.  16 g  2  . lisinopril (PRINIVIL,ZESTRIL) 20 MG tablet Take 1 tablet (20 mg total) by mouth daily.  31 tablet  5  . PARoxetine (PAXIL) 20 MG tablet Take 1 tablet (20 mg total) by mouth daily.  30 tablet  11  . traMADol (ULTRAM) 50 MG tablet Take 1 tablet (50 mg total) by mouth every 6 (six) hours as needed for pain.  60 tablet  1  . triamterene-hydrochlorothiazide (MAXZIDE-25) 37.5-25 MG per tablet Take 1 each (1 tablet total) by mouth daily.  30 tablet  11  . albuterol (PROVENTIL HFA;VENTOLIN HFA) 108 (90 BASE) MCG/ACT inhaler Inhale 2 puffs into the lungs every 6 (six) hours as needed for wheezing.  1 Inhaler  5     Assessment: 66yo female admitted 09/13/2012  With claudication now s/p infusion thrombolysis with TNKase and IV heparin.  Heparin resumed s/p left Fem-pop bypass graft today.  RN reports that IV was leaking and turned off for 1 hour  and then resumed.  First heparin level technically about 6 hours after restart (instead of 8) is sub-therapeutic at 0.20 on 800/hr. Warfarin also has been started and dose charted this pm.   Goal of Therapy:  Heparin level 0.3-0.7 Monitor platelets by anticoagulation protocol: Yes   Plan:  1. Increase heparin to 850 units/hr.  2. Repeat heparin level in 8 hours.   Link Snuffer, PharmD, BCPS Clinical Pharmacist [phone] 09/17/2012, 6:53 PM

## 2012-09-18 ENCOUNTER — Telehealth: Payer: Self-pay | Admitting: *Deleted

## 2012-09-18 ENCOUNTER — Telehealth (HOSPITAL_COMMUNITY): Payer: Self-pay | Admitting: *Deleted

## 2012-09-18 DIAGNOSIS — I743 Embolism and thrombosis of arteries of the lower extremities: Secondary | ICD-10-CM | POA: Diagnosis not present

## 2012-09-18 LAB — PROTIME-INR
INR: 1.14 (ref 0.00–1.49)
Prothrombin Time: 14.4 seconds (ref 11.6–15.2)

## 2012-09-18 LAB — CBC
Hemoglobin: 10.4 g/dL — ABNORMAL LOW (ref 12.0–15.0)
MCHC: 32.9 g/dL (ref 30.0–36.0)
RBC: 3.55 MIL/uL — ABNORMAL LOW (ref 3.87–5.11)

## 2012-09-18 LAB — POCT ACTIVATED CLOTTING TIME: Activated Clotting Time: 129 seconds

## 2012-09-18 MED ORDER — TRAMADOL HCL 50 MG PO TABS: 50.0000 mg | ORAL_TABLET | Freq: Four times a day (QID) | ORAL | Status: DC | PRN

## 2012-09-18 MED ORDER — ENOXAPARIN (LOVENOX) PATIENT EDUCATION KIT
PACK | Freq: Once | Status: AC
  Administered 2012-09-18: 13:00:00
  Filled 2012-09-18: qty 1

## 2012-09-18 MED ORDER — WARFARIN SODIUM 4 MG PO TABS: 4.0000 mg | ORAL_TABLET | Freq: Every day | ORAL | Status: DC

## 2012-09-18 MED ORDER — ENOXAPARIN SODIUM 80 MG/0.8ML ~~LOC~~ SOLN
1.5000 mg/kg | SUBCUTANEOUS | Status: DC
  Administered 2012-09-18: 70 mg via SUBCUTANEOUS
  Filled 2012-09-18 (×2): qty 0.8

## 2012-09-18 MED ORDER — WARFARIN SODIUM 2 MG PO TABS
4.0000 mg | ORAL_TABLET | Freq: Every day | ORAL | Status: DC
  Administered 2012-09-18: 4 mg via ORAL
  Filled 2012-09-18: qty 2

## 2012-09-18 MED ORDER — ENOXAPARIN SODIUM 80 MG/0.8ML ~~LOC~~ SOLN: 1.5000 mg/kg | SUBCUTANEOUS | Status: DC

## 2012-09-18 NOTE — Progress Notes (Signed)
ANTICOAGULATION CONSULT NOTE - Follow Up Consult  Pharmacy Consult for :  coumadin; lovenox per MD Indication: Thrombosed Left Fem-Pop, s/p TNKase infusion thrombolysis  Patient Measurements: Height: 5\' 4"  (162.6 cm) Weight: 103 lb 2.8 oz (46.8 kg) IBW/kg (Calculated) : 54.7  Heparin Dosing Weight: 46.8kg  Vital Signs: Temp: 98.1 F (36.7 C) (11/04 0544) Temp src: Oral (11/04 0544) BP: 131/74 mmHg (11/04 0544) Pulse Rate: 63  (11/04 0544)  Labs:  Basename 09/18/12 0111 09/17/12 1804 09/17/12 0605 09/16/12 1100 09/16/12 0443  HGB 10.4* -- 10.5* -- --  HCT 31.6* -- 32.9* -- 30.9*  PLT 157 -- 143* -- 120*  APTT -- -- -- -- --  LABPROT 14.4 -- -- -- --  INR 1.14 -- -- -- --  HEPARINUNFRC -- 0.20* -- 0.42 0.47  CREATININE -- -- -- -- --  CKTOTAL -- -- -- -- --  CKMB -- -- -- -- --  TROPONINI -- -- -- -- --    Estimated Creatinine Clearance: 51.8 ml/min (by C-G formula based on Cr of 0.6).  Assessment: 66yo female admitted 09/13/2012 with thrombosis of left fem-pop bypass graft now s/p successful thrombolysis with TNKase and IV heparin.  Her heparin drip was stopped this morning and she will start on once daily LMWH.  Her INR is 1.14 after 1 dose of coumadin 4 mg as expected.  Her H/H is stable and her PLTC is 157.  No bleeding reported. Coumadin score = 3  Goal of Therapy:  INR 2-3  Plan:  1. Lovenox 1.5 mg/kg/day =  70 mg sq 24hrs 2. Coumadin 4 mg po daily using 2 mg tablets for flexibility once discharged home 3. Coumadin book and video completed 09/17/12 4. lovenox DC kit ordered for pt Herby Abraham, Pharm.D. [phone] 09/18/2012 9:06 AM

## 2012-09-18 NOTE — Progress Notes (Addendum)
[facility] [place]  Subjective  -   BYPASS GRAFT FEMORAL-POPLITEAL ARTERY (Left) - Thrombectomy of Left Femoral-Popliteal Bypass Graft  Successful lysis of left femoropopliteal bypass.  On  Coumadin and Lovenox will be started today.   Objective 131/74 63 98.1 F (36.7 C) (Oral) 18 98%  Intake/Output Summary (Last 24 hours) at 09/18/12 0754 Last data filed at 09/18/12 0600  Gross per 24 hour  Intake    820 ml  Output   1750 ml  Net   -930 ml    Right groin clean and dry applied new dry compression dressing.  Bilateral feet warm with full range of motion and sensation intact and equal.      Assessment/Planning: POD #4  Plan D/C home with Lovenox single dose may need RN or family member to dose medication until INR is therapeutic. We will D/C heparin today AND START Lovenox.  Clinton Gallant [facility] 09/18/2012 7:54 AM --  Laboratory Lab Results:  Basename 09/18/12 0111 09/17/12 0605  WBC 5.3 5.0  HGB 10.4* 10.5*  HCT 31.6* 32.9*  PLT 157 143*   BMET No results found for this basename: NA:2,K:2,CL:2,CO2:2,GLUCOSE:2,BUN:2,CREATININE:2,CALCIUM:2 in the last 72 hours  COAG Lab Results  Component Value Date   INR 1.14 09/18/2012   INR 1.13 09/14/2012   INR 1.06 09/14/2012   No results found for this basename: PTT    Antibiotics Anti-infectives     Start     Dose/Rate Route Frequency Ordered Stop   09/14/12 0159   cefUROXime (ZINACEF) 1.5 g in dextrose 5 % 50 mL IVPB        1.5 g 100 mL/hr over 30 Minutes Intravenous 60 min pre-op 09/14/12 0159 09/15/12 2033         Addendum  I have independently interviewed and examined the patient, and I agree with the physician assistant's findings.  Left foot warm with weakly palpable pedals.  Reviewing the angiograms from Friday reveals a proximal stenosis likely in the native common femoral artery.  I think it worth it try to use Coumadin as an adjunct for patency.  If she reoccludes, an  iliofemoral endarterectomy will likely be needed.  Her below-the-knee popliteal and posterior tibial artery has hypertrophied significantly since her last angiogram, suggesting both are reasonable targets at this point.  Once her home needs are set up and Lovenox to Coumadin bridge is set up, patient can leave: today or tomorrow anticipated.  Leonides Sake, MD [facility] [place] Office: [phone] Pager: [phone]  09/18/2012, 8:11 AM

## 2012-09-18 NOTE — Telephone Encounter (Signed)
Will forward to Dr Josem Kaufmann
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
## 2012-09-18 NOTE — Telephone Encounter (Signed)
Received a call from Dr Imogene Burn at the [facility]. # [phone] The message is from Dr Imogene Burn wanting Dr Josem Kaufmann to know pt will be on coumadin.   Home health nurse will do PT/INR and call us results to follow coumadin. Pt had Left bypass graft of femoral-popliteal artery.  This is the message that was left.  Is this okay with you? Not sure if pt is home bound, will call Dr Nicky Pugh office tomorrow.

## 2012-09-19 DIAGNOSIS — J449 Chronic obstructive pulmonary disease, unspecified: Secondary | ICD-10-CM | POA: Diagnosis not present

## 2012-09-19 DIAGNOSIS — I739 Peripheral vascular disease, unspecified: Secondary | ICD-10-CM | POA: Diagnosis not present

## 2012-09-19 DIAGNOSIS — Z7901 Long term (current) use of anticoagulants: Secondary | ICD-10-CM | POA: Diagnosis not present

## 2012-09-19 DIAGNOSIS — I10 Essential (primary) hypertension: Secondary | ICD-10-CM | POA: Diagnosis not present

## 2012-09-19 DIAGNOSIS — T82898A Other specified complication of vascular prosthetic devices, implants and grafts, initial encounter: Secondary | ICD-10-CM | POA: Diagnosis not present

## 2012-09-19 DIAGNOSIS — I743 Embolism and thrombosis of arteries of the lower extremities: Secondary | ICD-10-CM | POA: Diagnosis not present

## 2012-09-19 DIAGNOSIS — F329 Major depressive disorder, single episode, unspecified: Secondary | ICD-10-CM | POA: Diagnosis not present

## 2012-09-19 NOTE — ED Provider Notes (Signed)
Medical screening examination/treatment/procedure(s) were conducted as a shared visit with non-physician practitioner(s) and myself.  I personally evaluated the patient during the encounter  Please see my other note for details  Lyanne Co, MD 09/19/12 [phone]

## 2012-09-20 ENCOUNTER — Telehealth: Payer: Self-pay | Admitting: *Deleted

## 2012-09-20 DIAGNOSIS — I10 Essential (primary) hypertension: Secondary | ICD-10-CM | POA: Diagnosis not present

## 2012-09-20 DIAGNOSIS — Z7901 Long term (current) use of anticoagulants: Secondary | ICD-10-CM | POA: Diagnosis not present

## 2012-09-20 DIAGNOSIS — I739 Peripheral vascular disease, unspecified: Secondary | ICD-10-CM | POA: Diagnosis not present

## 2012-09-20 DIAGNOSIS — J449 Chronic obstructive pulmonary disease, unspecified: Secondary | ICD-10-CM | POA: Diagnosis not present

## 2012-09-20 DIAGNOSIS — F329 Major depressive disorder, single episode, unspecified: Secondary | ICD-10-CM | POA: Diagnosis not present

## 2012-09-20 DIAGNOSIS — T82898A Other specified complication of vascular prosthetic devices, implants and grafts, initial encounter: Secondary | ICD-10-CM | POA: Diagnosis not present

## 2012-09-20 NOTE — Telephone Encounter (Signed)
Angie, [facility] calls for verbal order for PT- eval and treat, it is given- is this ok w/ you?

## 2012-09-21 ENCOUNTER — Telehealth: Payer: Self-pay | Admitting: *Deleted

## 2012-09-21 DIAGNOSIS — Z7901 Long term (current) use of anticoagulants: Secondary | ICD-10-CM | POA: Diagnosis not present

## 2012-09-21 DIAGNOSIS — J449 Chronic obstructive pulmonary disease, unspecified: Secondary | ICD-10-CM | POA: Diagnosis not present

## 2012-09-21 DIAGNOSIS — I10 Essential (primary) hypertension: Secondary | ICD-10-CM | POA: Diagnosis not present

## 2012-09-21 DIAGNOSIS — I739 Peripheral vascular disease, unspecified: Secondary | ICD-10-CM | POA: Diagnosis not present

## 2012-09-21 DIAGNOSIS — F329 Major depressive disorder, single episode, unspecified: Secondary | ICD-10-CM | POA: Diagnosis not present

## 2012-09-21 DIAGNOSIS — T82898A Other specified complication of vascular prosthetic devices, implants and grafts, initial encounter: Secondary | ICD-10-CM | POA: Diagnosis not present

## 2012-09-21 NOTE — Telephone Encounter (Signed)
[facility] calls PT= 15.3, INR= 1.3, pt is currently taking 4mg  Please advise Kristin Knight,  [phone]

## 2012-09-22 ENCOUNTER — Telehealth: Payer: Self-pay | Admitting: Vascular Surgery

## 2012-09-22 DIAGNOSIS — I739 Peripheral vascular disease, unspecified: Secondary | ICD-10-CM | POA: Diagnosis not present

## 2012-09-22 DIAGNOSIS — I10 Essential (primary) hypertension: Secondary | ICD-10-CM | POA: Diagnosis not present

## 2012-09-22 DIAGNOSIS — T82898A Other specified complication of vascular prosthetic devices, implants and grafts, initial encounter: Secondary | ICD-10-CM | POA: Diagnosis not present

## 2012-09-22 DIAGNOSIS — Z7901 Long term (current) use of anticoagulants: Secondary | ICD-10-CM | POA: Diagnosis not present

## 2012-09-22 DIAGNOSIS — F329 Major depressive disorder, single episode, unspecified: Secondary | ICD-10-CM | POA: Diagnosis not present

## 2012-09-22 DIAGNOSIS — J449 Chronic obstructive pulmonary disease, unspecified: Secondary | ICD-10-CM | POA: Diagnosis not present

## 2012-09-22 NOTE — Telephone Encounter (Signed)
Message copied by Margaretmary Eddy on Fri Sep 22, 2012  2:17 PM ------      Message from: Melene Plan      Created: Mon Sep 18, 2012  4:11 PM                   ----- Message -----         From: Dara Lords, PA         Sent: 09/18/2012   3:34 PM           To: Melene Plan, RN, Sharee Pimple, CMA            Lysis left fem pop bypass graft      Dr. Milta Deiters in 2 weeks.            Darel Hong,       Could you let Dr. Charlesetta Shanks office know that she is going to be on coumadin and the [facility] RN is going to draw a PT/INR on Wed and call it to them for them to follow her coumadin.            Thanks,      Lelon Mast

## 2012-09-25 DIAGNOSIS — F329 Major depressive disorder, single episode, unspecified: Secondary | ICD-10-CM | POA: Diagnosis not present

## 2012-09-25 DIAGNOSIS — T82898A Other specified complication of vascular prosthetic devices, implants and grafts, initial encounter: Secondary | ICD-10-CM | POA: Diagnosis not present

## 2012-09-25 DIAGNOSIS — I739 Peripheral vascular disease, unspecified: Secondary | ICD-10-CM | POA: Diagnosis not present

## 2012-09-25 DIAGNOSIS — J449 Chronic obstructive pulmonary disease, unspecified: Secondary | ICD-10-CM | POA: Diagnosis not present

## 2012-09-25 DIAGNOSIS — I10 Essential (primary) hypertension: Secondary | ICD-10-CM | POA: Diagnosis not present

## 2012-09-25 DIAGNOSIS — Z7901 Long term (current) use of anticoagulants: Secondary | ICD-10-CM | POA: Diagnosis not present

## 2012-09-25 NOTE — Telephone Encounter (Signed)
Yes

## 2012-09-25 NOTE — Telephone Encounter (Signed)
I spoke with Dr. Alexandria Lodge and he made a recommendation on coumadin dosing adjustment of 15% increase.  We will therefore dose the coumadin as follows: M Tu W Th F Sa Su 6 mg 6 mg 4 mg 4 mg 4 mg 4 mg 4 mg  I spoke with Angelique Blonder, the Home Health Nurse who sees Kristin Knight twice a week (Mon and Fri) and gave her the above dosing changes.  She is scheduled to see Kristin Knight this afternoon and will relay these changes to her.  She will also obtain an INR on Friday so we can make further changes, if necessary.  She is to continue the levonox.  Kristin Knight has been scheduled to follow-up in our Anticoagulation Clinic on Monday 11/18 at 9:45 AM.  Kristin Knight will be called with the appointment date and time.  Goal INR is 2.0-3.0.  Will try to get clarification from Dr. Imogene Burn as to the duration of anticoagulation he requires.

## 2012-09-28 DIAGNOSIS — J449 Chronic obstructive pulmonary disease, unspecified: Secondary | ICD-10-CM | POA: Diagnosis not present

## 2012-09-28 DIAGNOSIS — Z7901 Long term (current) use of anticoagulants: Secondary | ICD-10-CM | POA: Diagnosis not present

## 2012-09-28 DIAGNOSIS — I739 Peripheral vascular disease, unspecified: Secondary | ICD-10-CM | POA: Diagnosis not present

## 2012-09-28 DIAGNOSIS — F329 Major depressive disorder, single episode, unspecified: Secondary | ICD-10-CM | POA: Diagnosis not present

## 2012-09-28 DIAGNOSIS — I10 Essential (primary) hypertension: Secondary | ICD-10-CM | POA: Diagnosis not present

## 2012-09-28 DIAGNOSIS — T82898A Other specified complication of vascular prosthetic devices, implants and grafts, initial encounter: Secondary | ICD-10-CM | POA: Diagnosis not present

## 2012-10-02 ENCOUNTER — Ambulatory Visit (INDEPENDENT_AMBULATORY_CARE_PROVIDER_SITE_OTHER): Payer: Medicare Other | Admitting: Pharmacist

## 2012-10-02 DIAGNOSIS — Z9889 Other specified postprocedural states: Secondary | ICD-10-CM

## 2012-10-02 DIAGNOSIS — F329 Major depressive disorder, single episode, unspecified: Secondary | ICD-10-CM | POA: Diagnosis not present

## 2012-10-02 DIAGNOSIS — J449 Chronic obstructive pulmonary disease, unspecified: Secondary | ICD-10-CM | POA: Diagnosis not present

## 2012-10-02 DIAGNOSIS — Z5181 Encounter for therapeutic drug level monitoring: Secondary | ICD-10-CM

## 2012-10-02 DIAGNOSIS — I10 Essential (primary) hypertension: Secondary | ICD-10-CM | POA: Diagnosis not present

## 2012-10-02 DIAGNOSIS — I739 Peripheral vascular disease, unspecified: Secondary | ICD-10-CM | POA: Diagnosis not present

## 2012-10-02 DIAGNOSIS — Z7901 Long term (current) use of anticoagulants: Secondary | ICD-10-CM

## 2012-10-02 DIAGNOSIS — T82898A Other specified complication of vascular prosthetic devices, implants and grafts, initial encounter: Secondary | ICD-10-CM | POA: Diagnosis not present

## 2012-10-02 DIAGNOSIS — Z95828 Presence of other vascular implants and grafts: Secondary | ICD-10-CM

## 2012-10-02 MED ORDER — WARFARIN SODIUM 4 MG PO TABS: ORAL_TABLET | ORAL | Status: DC

## 2012-10-02 NOTE — Progress Notes (Signed)
Anti-Coagulation Progress Note  Kristin Knight is a 66 y.o. female who is currently on an anti-coagulation regimen.    RECENT RESULTS: Recent results are below, the most recent result is correlated with a dose of 32 mg. per week: Lab Results  Component Value Date   INR 1.3 10/02/2012   INR 1.14 09/18/2012   INR 1.13 09/14/2012    ANTI-COAG DOSE:   Latest dosing instructions   Total Sun Mon Tue Wed Thu Fri Sat   38 4 mg 6 mg 6 mg 6 mg 6 mg 6 mg 4 mg    (4 mg1) (4 mg1.5) (4 mg1.5) (4 mg1.5) (4 mg1.5) (4 mg1.5) (4 mg1)         ANTICOAG SUMMARY: Anticoagulation Episode Summary              Current INR goal  Next INR check 10/09/2012   INR from last check 1.3 (10/02/2012)     Weekly max dose (mg)  Target end date    Indications    INR check location  Preferred lab    Send INR reminders to    Comments             ANTICOAG TODAY: Anticoagulation Summary as of 10/02/2012              INR goal      Selected INR 1.3 (10/02/2012) Next INR check 10/09/2012   Weekly max dose (mg)  Target end date    Indications     Anticoagulation Episode Summary              INR check location  Preferred lab    Send INR reminders to    Comments             PATIENT INSTRUCTIONS: Patient Instructions  Patient instructed to take medications as defined in the Anti-coagulation Track section of this encounter.  Patient instructed to take today's dose.  Patient verbalized understanding of these instructions.        FOLLOW-UP Return in 7 days (on 10/09/2012) for Follow up INR at 1145h.  Hulen Luster, III Pharm.D., CACP

## 2012-10-02 NOTE — Addendum Note (Signed)
Addended by: Hulen Luster B on: 10/02/2012 11:40 AM   Modules accepted: Orders

## 2012-10-02 NOTE — Patient Instructions (Signed)
Patient instructed to take medications as defined in the Anti-coagulation Track section of this encounter.  Patient instructed to take today's dose.  Patient verbalized understanding of these instructions.    

## 2012-10-05 DIAGNOSIS — J449 Chronic obstructive pulmonary disease, unspecified: Secondary | ICD-10-CM | POA: Diagnosis not present

## 2012-10-05 DIAGNOSIS — T82898A Other specified complication of vascular prosthetic devices, implants and grafts, initial encounter: Secondary | ICD-10-CM | POA: Diagnosis not present

## 2012-10-05 DIAGNOSIS — Z7901 Long term (current) use of anticoagulants: Secondary | ICD-10-CM | POA: Diagnosis not present

## 2012-10-05 DIAGNOSIS — F329 Major depressive disorder, single episode, unspecified: Secondary | ICD-10-CM | POA: Diagnosis not present

## 2012-10-05 DIAGNOSIS — I10 Essential (primary) hypertension: Secondary | ICD-10-CM | POA: Diagnosis not present

## 2012-10-05 DIAGNOSIS — I739 Peripheral vascular disease, unspecified: Secondary | ICD-10-CM | POA: Diagnosis not present

## 2012-10-06 ENCOUNTER — Encounter: Payer: Self-pay | Admitting: Internal Medicine

## 2012-10-06 ENCOUNTER — Ambulatory Visit: Payer: Medicare Other | Admitting: Internal Medicine

## 2012-10-06 NOTE — Progress Notes (Signed)
Dr. Saralyn Pilar assessment and plan were reviewed and I agree as documented.

## 2012-10-09 ENCOUNTER — Ambulatory Visit (INDEPENDENT_AMBULATORY_CARE_PROVIDER_SITE_OTHER): Payer: Medicare Other | Admitting: Pharmacist

## 2012-10-09 DIAGNOSIS — I70219 Atherosclerosis of native arteries of extremities with intermittent claudication, unspecified extremity: Secondary | ICD-10-CM | POA: Diagnosis not present

## 2012-10-09 DIAGNOSIS — I779 Disorder of arteries and arterioles, unspecified: Secondary | ICD-10-CM

## 2012-10-09 DIAGNOSIS — Z7901 Long term (current) use of anticoagulants: Secondary | ICD-10-CM

## 2012-10-09 DIAGNOSIS — I743 Embolism and thrombosis of arteries of the lower extremities: Secondary | ICD-10-CM

## 2012-10-09 NOTE — Patient Instructions (Signed)
Patient instructed to take medications as defined in the Anti-coagulation Track section of this encounter.  Patient instructed to take today's dose.  Patient verbalized understanding of these instructions.    f

## 2012-10-09 NOTE — Progress Notes (Signed)
Anti-Coagulation Progress Note  Kristin Knight is a 66 y.o. female who is currently on an anti-coagulation regimen.    RECENT RESULTS: Recent results are below, the most recent result is correlated with a dose of 38 mg. per week: Lab Results  Component Value Date   INR 2.0 10/09/2012   INR 1.3 10/02/2012   INR 1.14 09/18/2012    ANTI-COAG DOSE:   Latest dosing instructions   Total Sun Mon Tue Wed Thu Fri Sat   46 6 mg 8 mg 6 mg 6 mg 8 mg 6 mg 6 mg    (4 mg1.5) (4 mg2) (4 mg1.5) (4 mg1.5) (4 mg2) (4 mg1.5) (4 mg1.5)         ANTICOAG SUMMARY: Anticoagulation Episode Summary              Current INR goal 2.0-3.0 Next INR check 10/16/2012   INR from last check 2.0 (10/09/2012)     Weekly max dose (mg)  Target end date    Indications Peripheral arterial occlusive disease [444.22], Atherosclerosis of native arteries of the extremities with intermittent claudication [440.21], Long term (current) use of anticoagulants [V58.61]   INR check location [facility] Preferred lab    Send INR reminders to    Comments             ANTICOAG TODAY: Anticoagulation Summary as of 10/09/2012              INR goal 2.0-3.0     Selected INR 2.0 (10/09/2012) Next INR check 10/16/2012   Weekly max dose (mg)  Target end date    Indications Peripheral arterial occlusive disease [444.22], Atherosclerosis of native arteries of the extremities with intermittent claudication [440.21], Long term (current) use of anticoagulants [V58.61]    Anticoagulation Episode Summary              INR check location [facility] Preferred lab    Send INR reminders to    Comments             PATIENT INSTRUCTIONS: Patient Instructions  Patient instructed to take medications as defined in the Anti-coagulation Track section of this encounter.  Patient instructed to take today's dose.  Patient verbalized understanding of these instructions.    f    FOLLOW-UP Return in 7 days (on 10/16/2012) for  Follow up INR at 1145h.  Hulen Luster, III Pharm.D., CACP

## 2012-10-16 ENCOUNTER — Ambulatory Visit (INDEPENDENT_AMBULATORY_CARE_PROVIDER_SITE_OTHER): Payer: Medicare Other | Admitting: Pharmacist

## 2012-10-16 DIAGNOSIS — Z7901 Long term (current) use of anticoagulants: Secondary | ICD-10-CM

## 2012-10-16 DIAGNOSIS — I70219 Atherosclerosis of native arteries of extremities with intermittent claudication, unspecified extremity: Secondary | ICD-10-CM | POA: Diagnosis not present

## 2012-10-16 DIAGNOSIS — Z95828 Presence of other vascular implants and grafts: Secondary | ICD-10-CM

## 2012-10-16 DIAGNOSIS — I743 Embolism and thrombosis of arteries of the lower extremities: Secondary | ICD-10-CM

## 2012-10-16 DIAGNOSIS — Z5181 Encounter for therapeutic drug level monitoring: Secondary | ICD-10-CM

## 2012-10-16 DIAGNOSIS — I779 Disorder of arteries and arterioles, unspecified: Secondary | ICD-10-CM

## 2012-10-16 DIAGNOSIS — Z9889 Other specified postprocedural states: Secondary | ICD-10-CM | POA: Diagnosis not present

## 2012-10-16 LAB — POCT INR: INR: 3.7

## 2012-10-16 NOTE — Patient Instructions (Signed)
Patient instructed to take medications as defined in the Anti-coagulation Track section of this encounter.  Patient instructed to take today's dose.  Patient verbalized understanding of these instructions.    

## 2012-10-16 NOTE — Progress Notes (Signed)
Agree with Dr. Groce's Plan. 

## 2012-10-16 NOTE — Progress Notes (Signed)
Anti-Coagulation Progress Note  Kristin Knight is a 66 y.o. female who is currently on an anti-coagulation regimen.    RECENT RESULTS: Recent results are below, the most recent result is correlated with a dose of  46 mg. per week: Lab Results  Component Value Date   INR 3.7 10/16/2012   INR 2.0 10/09/2012   INR 1.3 10/02/2012    ANTI-COAG DOSE:   Latest dosing instructions   Total Sun Mon Tue Wed Thu Fri Sat   42 6 mg 6 mg 6 mg 6 mg 6 mg 6 mg 6 mg    (4 mg1.5) (4 mg1.5) (4 mg1.5) (4 mg1.5) (4 mg1.5) (4 mg1.5) (4 mg1.5)         ANTICOAG SUMMARY: Anticoagulation Episode Summary              Current INR goal 2.0-3.0 Next INR check 10/30/2012   INR from last check 3.7! (10/16/2012)     Weekly max dose (mg)  Target end date    Indications Peripheral arterial occlusive disease [444.22], Atherosclerosis of native arteries of the extremities with intermittent claudication [440.21], Long term (current) use of anticoagulants [V58.61]   INR check location [facility] Preferred lab    Send INR reminders to    Comments             ANTICOAG TODAY: Anticoagulation Summary as of 10/16/2012              INR goal 2.0-3.0     Selected INR 3.7! (10/16/2012) Next INR check 10/30/2012   Weekly max dose (mg)  Target end date    Indications Peripheral arterial occlusive disease [444.22], Atherosclerosis of native arteries of the extremities with intermittent claudication [440.21], Long term (current) use of anticoagulants [V58.61]    Anticoagulation Episode Summary              INR check location [facility] Preferred lab    Send INR reminders to    Comments             PATIENT INSTRUCTIONS: Patient Instructions  Patient instructed to take medications as defined in the Anti-coagulation Track section of this encounter.  Patient instructed to take today's dose.  Patient verbalized understanding of these instructions.        FOLLOW-UP Return in 2 weeks (on 10/30/2012)  for Follow up INR at 1145h.  Hulen Luster, III Pharm.D., CACP

## 2012-10-30 ENCOUNTER — Ambulatory Visit (INDEPENDENT_AMBULATORY_CARE_PROVIDER_SITE_OTHER): Payer: Medicare Other | Admitting: Pharmacist

## 2012-10-30 DIAGNOSIS — Z7901 Long term (current) use of anticoagulants: Secondary | ICD-10-CM | POA: Diagnosis not present

## 2012-10-30 DIAGNOSIS — I743 Embolism and thrombosis of arteries of the lower extremities: Secondary | ICD-10-CM

## 2012-10-30 DIAGNOSIS — Z95828 Presence of other vascular implants and grafts: Secondary | ICD-10-CM

## 2012-10-30 DIAGNOSIS — I779 Disorder of arteries and arterioles, unspecified: Secondary | ICD-10-CM

## 2012-10-30 DIAGNOSIS — I70219 Atherosclerosis of native arteries of extremities with intermittent claudication, unspecified extremity: Secondary | ICD-10-CM | POA: Diagnosis not present

## 2012-10-30 DIAGNOSIS — Z9889 Other specified postprocedural states: Secondary | ICD-10-CM

## 2012-10-30 DIAGNOSIS — Z5181 Encounter for therapeutic drug level monitoring: Secondary | ICD-10-CM

## 2012-10-30 LAB — POCT INR: INR: 3.6

## 2012-10-30 NOTE — Progress Notes (Signed)
Anti-Coagulation Progress Note  Kristin Knight is a 66 y.o. female who is currently on an anti-coagulation regimen.    RECENT RESULTS: Recent results are below, the most recent result is correlated with a dose of 42 mg. per week: Lab Results  Component Value Date   INR 3.60 10/30/2012   INR 3.7 10/16/2012   INR 2.0 10/09/2012    ANTI-COAG DOSE:   Latest dosing instructions   Total Sun Mon Tue Wed Thu Fri Sat   38 6 mg 4 mg 6 mg 6 mg 4 mg 6 mg 6 mg    (4 mg1.5) (4 mg1) (4 mg1.5) (4 mg1.5) (4 mg1) (4 mg1.5) (4 mg1.5)         ANTICOAG SUMMARY: Anticoagulation Episode Summary              Current INR goal 2.0-3.0 Next INR check 11/20/2012   INR from last check 3.60! (10/30/2012)     Weekly max dose (mg)  Target end date    Indications Peripheral arterial occlusive disease [444.22], Atherosclerosis of native arteries of the extremities with intermittent claudication [440.21], Long term (current) use of anticoagulants [V58.61]   INR check location [facility] Preferred lab    Send INR reminders to    Comments             ANTICOAG TODAY: Anticoagulation Summary as of 10/30/2012              INR goal 2.0-3.0     Selected INR 3.60! (10/30/2012) Next INR check 11/20/2012   Weekly max dose (mg)  Target end date    Indications Peripheral arterial occlusive disease [444.22], Atherosclerosis of native arteries of the extremities with intermittent claudication [440.21], Long term (current) use of anticoagulants [V58.61]    Anticoagulation Episode Summary              INR check location [facility] Preferred lab    Send INR reminders to    Comments             PATIENT INSTRUCTIONS: Patient Instructions  Patient instructed to take medications as defined in the Anti-coagulation Track section of this encounter.  Patient instructed to take today's dose.  Patient verbalized understanding of these instructions.        FOLLOW-UP Return in 3 weeks (on 11/20/2012) for  Follow up INR at 1130h.  Hulen Luster, III Pharm.D., CACP

## 2012-10-30 NOTE — Patient Instructions (Signed)
Patient instructed to take medications as defined in the Anti-coagulation Track section of this encounter.  Patient instructed to take today's dose.  Patient verbalized understanding of these instructions.    

## 2012-11-04 ENCOUNTER — Other Ambulatory Visit: Payer: Self-pay | Admitting: Internal Medicine

## 2012-11-04 MED ORDER — WARFARIN SODIUM 4 MG PO TABS: ORAL_TABLET | ORAL | Status: DC

## 2012-11-04 NOTE — Telephone Encounter (Signed)
Patient called after-hours for coumadin refill.  Refill sent to pharmacy, with instructions as per anti-coag visit 10/30/12.  Signed, Janalyn Harder, PGY2 11/04/2012, 5:30 PM

## 2012-11-06 ENCOUNTER — Ambulatory Visit (INDEPENDENT_AMBULATORY_CARE_PROVIDER_SITE_OTHER): Payer: Medicare Other | Admitting: Pharmacist

## 2012-11-06 DIAGNOSIS — I779 Disorder of arteries and arterioles, unspecified: Secondary | ICD-10-CM

## 2012-11-06 DIAGNOSIS — I743 Embolism and thrombosis of arteries of the lower extremities: Secondary | ICD-10-CM

## 2012-11-06 DIAGNOSIS — Z5181 Encounter for therapeutic drug level monitoring: Secondary | ICD-10-CM

## 2012-11-06 DIAGNOSIS — I70219 Atherosclerosis of native arteries of extremities with intermittent claudication, unspecified extremity: Secondary | ICD-10-CM

## 2012-11-06 DIAGNOSIS — Z9889 Other specified postprocedural states: Secondary | ICD-10-CM | POA: Diagnosis not present

## 2012-11-06 DIAGNOSIS — Z7901 Long term (current) use of anticoagulants: Secondary | ICD-10-CM

## 2012-11-06 DIAGNOSIS — Z95828 Presence of other vascular implants and grafts: Secondary | ICD-10-CM

## 2012-11-06 LAB — POCT INR: INR: 1.9

## 2012-11-06 NOTE — Progress Notes (Signed)
Anti-Coagulation Progress Note  Kristin Knight is a 66 y.o. female who is currently on an anti-coagulation regimen.    RECENT RESULTS: Recent results are below, the most recent result is correlated with a dose of 38 mg. per week: Lab Results  Component Value Date   INR 1.90 11/06/2012   INR 3.60 10/30/2012   INR 3.7 10/16/2012    ANTI-COAG DOSE:   Latest dosing instructions   Total Sun Mon Tue Wed Thu Fri Sat   40 6 mg 6 mg 6 mg 6 mg 4 mg 6 mg 6 mg    (4 mg1.5) (4 mg1.5) (4 mg1.5) (4 mg1.5) (4 mg1) (4 mg1.5) (4 mg1.5)         ANTICOAG SUMMARY: Anticoagulation Episode Summary              Current INR goal 2.0-3.0 Next INR check 11/27/2012   INR from last check 1.90! 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  Patient instructed to take today's dose.  Patient verbalized understanding of these instructions.        FOLLOW-UP Return in 3 weeks (on 11/27/2012)  for Follow up INR at 1130h.  Hulen Luster, III Pharm.D., CACP

## 2012-11-06 NOTE — Patient Instructions (Signed)
Patient instructed to take medications as defined in the Anti-coagulation Track section of this encounter.  Patient instructed to take today's dose.  Patient verbalized understanding of these instructions.    

## 2012-11-20 ENCOUNTER — Ambulatory Visit: Payer: Medicare Other

## 2012-11-24 ENCOUNTER — Encounter: Payer: Self-pay | Admitting: Internal Medicine

## 2012-11-24 ENCOUNTER — Ambulatory Visit (INDEPENDENT_AMBULATORY_CARE_PROVIDER_SITE_OTHER): Payer: Medicare Other | Admitting: Internal Medicine

## 2012-11-24 VITALS — BP 125/85 | HR 83 | Temp 97.0°F | Wt 105.5 lb

## 2012-11-24 DIAGNOSIS — Z0000 Encounter for general adult medical examination without abnormal findings: Secondary | ICD-10-CM

## 2012-11-24 DIAGNOSIS — D649 Anemia, unspecified: Secondary | ICD-10-CM

## 2012-11-24 DIAGNOSIS — J302 Other seasonal allergic rhinitis: Secondary | ICD-10-CM

## 2012-11-24 DIAGNOSIS — F17200 Nicotine dependence, unspecified, uncomplicated: Secondary | ICD-10-CM

## 2012-11-24 DIAGNOSIS — T82868A Thrombosis of vascular prosthetic devices, implants and grafts, initial encounter: Secondary | ICD-10-CM

## 2012-11-24 DIAGNOSIS — E785 Hyperlipidemia, unspecified: Secondary | ICD-10-CM

## 2012-11-24 DIAGNOSIS — R634 Abnormal weight loss: Secondary | ICD-10-CM | POA: Diagnosis not present

## 2012-11-24 DIAGNOSIS — F32A Depression, unspecified: Secondary | ICD-10-CM

## 2012-11-24 DIAGNOSIS — Z720 Tobacco use: Secondary | ICD-10-CM

## 2012-11-24 DIAGNOSIS — I10 Essential (primary) hypertension: Secondary | ICD-10-CM | POA: Diagnosis not present

## 2012-11-24 DIAGNOSIS — I743 Embolism and thrombosis of arteries of the lower extremities: Secondary | ICD-10-CM | POA: Diagnosis not present

## 2012-11-24 DIAGNOSIS — T82898A Other specified complication of vascular prosthetic devices, implants and grafts, initial encounter: Secondary | ICD-10-CM | POA: Diagnosis not present

## 2012-11-24 DIAGNOSIS — F329 Major depressive disorder, single episode, unspecified: Secondary | ICD-10-CM | POA: Diagnosis not present

## 2012-11-24 DIAGNOSIS — J309 Allergic rhinitis, unspecified: Secondary | ICD-10-CM | POA: Diagnosis not present

## 2012-11-24 DIAGNOSIS — Z1211 Encounter for screening for malignant neoplasm of colon: Secondary | ICD-10-CM

## 2012-11-24 DIAGNOSIS — M62838 Other muscle spasm: Secondary | ICD-10-CM

## 2012-11-24 DIAGNOSIS — I779 Disorder of arteries and arterioles, unspecified: Secondary | ICD-10-CM

## 2012-11-24 HISTORY — DX: Other muscle spasm: M62.838

## 2012-11-24 HISTORY — DX: Other seasonal allergic rhinitis: J30.2

## 2012-11-24 HISTORY — DX: Thrombosis due to vascular prosthetic devices, implants and grafts, initial encounter: T82.868A

## 2012-11-24 LAB — LIPID PANEL
Cholesterol: 192 mg/dL (ref 0–200)
LDL Cholesterol: 114 mg/dL — ABNORMAL HIGH (ref 0–99)
Total CHOL/HDL Ratio: 3.2 Ratio
VLDL: 18 mg/dL (ref 0–40)

## 2012-11-24 LAB — CBC
MCH: 28.3 pg (ref 26.0–34.0)
MCV: 85.7 fL (ref 78.0–100.0)
Platelets: 296 10*3/uL (ref 150–400)
RDW: 13.7 % (ref 11.5–15.5)
WBC: 4.1 10*3/uL (ref 4.0–10.5)

## 2012-11-24 MED ORDER — BACLOFEN 10 MG PO TABS: 10.0000 mg | ORAL_TABLET | Freq: Three times a day (TID) | ORAL | Status: DC | PRN

## 2012-11-24 MED ORDER — FLUOXETINE HCL 20 MG PO CAPS: 20.0000 mg | ORAL_CAPSULE | Freq: Every day | ORAL | Status: DC

## 2012-11-24 NOTE — Progress Notes (Signed)
  Subjective:    Patient ID: Kristin Knight, female    DOB: 1946-06-25, 66 y.o.   MRN: [id number]  HPI  Please see the A&P for the status of the pt's chronic medical problems.  Review of Systems  Constitutional: Positive for activity change, appetite change, fatigue and unexpected weight change.  HENT: Positive for neck pain. Negative for congestion, rhinorrhea, neck stiffness, postnasal drip and sinus pressure.   Respiratory: Negative for chest tightness, shortness of breath and wheezing.   Cardiovascular: Negative for chest pain, palpitations and leg swelling.  Gastrointestinal: Negative for abdominal pain.  Musculoskeletal: Positive for myalgias. Negative for back pain, joint swelling and arthralgias.  Skin: Negative for rash and wound.  Neurological: Negative for dizziness, seizures, weakness, light-headedness and numbness.  Psychiatric/Behavioral: Positive for sleep disturbance, dysphoric mood and decreased concentration. Negative for behavioral problems, confusion and agitation. The patient is not nervous/anxious.       Objective:   Physical Exam  Nursing note and vitals reviewed. Constitutional: She is oriented to person, place, and time. She appears well-developed and well-nourished. No distress.  HENT:  Head: Normocephalic and atraumatic.  Mouth/Throat: Oropharynx is clear and moist. No oropharyngeal exudate.  Eyes: Conjunctivae normal are normal. Right eye exhibits no discharge. Left eye exhibits no discharge. No scleral icterus.  Neck: Normal range of motion. Neck supple. No tracheal deviation present. No thyromegaly present.  Cardiovascular: Normal rate, regular rhythm and normal heart sounds.  Exam reveals no gallop and no friction rub.   No murmur heard. Pulmonary/Chest: Effort normal and breath sounds normal. No stridor. No respiratory distress. She has no wheezes. She has no rales.  Abdominal: Soft. Bowel sounds are normal. She exhibits no distension. There is no  tenderness. There is no rebound and no guarding.  Musculoskeletal: Normal range of motion. She exhibits no edema and no tenderness.  Lymphadenopathy:    She has no cervical adenopathy.  Neurological: She is alert and oriented to person, place, and time. She exhibits normal muscle tone.  Skin: Skin is warm and dry. No rash noted. She is not diaphoretic. No erythema.  Psychiatric: She has a normal mood and affect. Her behavior is normal. Judgment and thought content normal.      Assessment & Plan:   Please see problem oriented charting.

## 2012-11-24 NOTE — Assessment & Plan Note (Signed)
She currently denies any claudication symptoms. We discussed the importance of smoking cessation given her vascular disease. It also is important to maintain control of her blood pressure and she is currently at target. She has not been taking her pravastatin secondary to cost. Although it is ideal that she be on a statin we have checked her lipid panel and it is pending at this time. As the goal LDL is less than 100, if she were to have an elevated LDL we will encourage her to restart the pravastatin or similar medication that may be affordable.

## 2012-11-24 NOTE — Assessment & Plan Note (Signed)
Her blood pressure today is 125/85 which is at target. She is tolerating the lisinopril and Maxzide well without any side effects. We will continue these medications at the current doses. A basic metabolic panel is currently pending to assure her electrolytes are within normal limits.

## 2012-11-24 NOTE — Assessment & Plan Note (Signed)
The cause of her normocytic normochromic anemia is unclear although may have been related to her surgical intervention. A CBC without differential has been repeated to assess for persistence of the anemia. If present we will initiate an evaluation. The results are currently pending.

## 2012-11-24 NOTE — Assessment & Plan Note (Signed)
Kristin Knight notes continued feelings of depression. These have been persistent for several years and she feels that the Paxil has been completely ineffective. Because of her depression she notes she has no desire to get out and about, has had difficulty sleeping, and has had a decreased appetite with resultant weight loss. She is interested in trying a different antidepressant. We will therefore discontinue the Paxil and start Prozac 20 mg by mouth daily. We discussed the fact that it may take a few weeks for the medication to take effect. We will reassess her response to Prozac at the followup visit.

## 2012-11-24 NOTE — Assessment & Plan Note (Signed)
Kristin Knight is currently smoking half a pack a day. She's had several times in her life that she was able to stop cold [place]. She wishes to stop at this time and realizes it is detrimental to her health. She is interested in quitting at this time but prefers not to have any medications as she can quit cold [place] as she has in the past. Her willingness to quit was praised and she was asked to call the clinic if she should need help. Materials were provided for her education.

## 2012-11-24 NOTE — Patient Instructions (Signed)
It was nice to meet you.  I look forward to taking care of you for years to come.  1) I think it is wonderful that you want to quit smoking.  I encourage you to do so.  It may be even easier if we can control your depression.  2) Stop the Paxil for your depression since it was not working and start Prozac 20 mg by mouth each morning.  It may take a few weeks to start working.  3) We start baclofen 10 mg by mouth up to 3 times a day as needed for neck pain/spasms.  It may make you sleepy so be careful when taking this medication.  You only need to take it when you have the neck spasms or pain.  4) We will make a referral for a colonoscopy.  5) I will let you know next week if any of the labs came back that we are worried about.  6) Keep taking all of your other medications as prescribed.  I will see you back in 3 months to see how the depression and neck pain are doing.  Call for an appointment if you need to be seen sooner.

## 2012-11-24 NOTE — Assessment & Plan Note (Signed)
She is currently asymptomatic as her symptoms are usually in the spring. She has Flonase which she can use as needed.

## 2012-11-24 NOTE — Assessment & Plan Note (Signed)
She is complaining of left neck pain. On exam she has spasms of the trapezius muscle on the left. There are no spasms on the right. Tramadol has been ineffective. Given the exam findings of muscle spasm we will start baclofen 10 mg by mouth every 8 hours as needed for spasms and pain. She was warned that the medication may cause somnolence and was advised to take the first dose just before bedtime. If the baclofen is ineffective at controlling her pain we will reassess for an alternative etiology.

## 2012-11-24 NOTE — Assessment & Plan Note (Signed)
She is currently on Coumadin which will be continued. She is followed closely by Dr. Alexandria Lodge in the anticoagulation clinic. 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 The goal INR is between 2.0 and 3.0.

## 2012-11-24 NOTE — Assessment & Plan Note (Signed)
She missed her previous appointment for a colonoscopy. A repeat referral was made in order to have colon cancer screening. She was under the impression that she previously had a total abdominal hysterectomy. Subsequent reviews of the record revealed that it may have been a partial hysterectomy. I will discuss this with her at the next visit and offer her a pelvic exam to assess for any residual cervix so that a Pap smear could be obtained. We will also discuss a bone density examination at the followup visit given her history of a thoracic compression fracture, smoking, history of alcohol abuse, and low weight.

## 2012-11-25 LAB — BASIC METABOLIC PANEL WITH GFR
BUN: 11 mg/dL (ref 6–23)
CO2: 32 mEq/L (ref 19–32)
Chloride: 100 mEq/L (ref 96–112)
Creat: 0.86 mg/dL (ref 0.50–1.10)
Glucose, Bld: 85 mg/dL (ref 70–99)

## 2012-11-27 ENCOUNTER — Ambulatory Visit (INDEPENDENT_AMBULATORY_CARE_PROVIDER_SITE_OTHER): Payer: Medicare Other | Admitting: Pharmacist

## 2012-11-27 DIAGNOSIS — Z95828 Presence of other vascular implants and grafts: Secondary | ICD-10-CM

## 2012-11-27 DIAGNOSIS — Z7901 Long term (current) use of anticoagulants: Secondary | ICD-10-CM

## 2012-11-27 DIAGNOSIS — I779 Disorder of arteries and arterioles, unspecified: Secondary | ICD-10-CM

## 2012-11-27 DIAGNOSIS — Z9889 Other specified postprocedural states: Secondary | ICD-10-CM | POA: Diagnosis not present

## 2012-11-27 DIAGNOSIS — I743 Embolism and thrombosis of arteries of the lower extremities: Secondary | ICD-10-CM | POA: Diagnosis not present

## 2012-11-27 LAB — POCT INR: INR: 4

## 2012-11-27 NOTE — Patient Instructions (Signed)
Patient instructed to take medications as defined in the Anti-coagulation Track section of this encounter.  Patient instructed to take today's dose.  Patient verbalized understanding of these instructions.    

## 2012-11-27 NOTE — Progress Notes (Signed)
Anti-Coagulation Progress Note  Kristin Knight is a 67 y.o. female who is currently on an anti-coagulation regimen.    RECENT RESULTS: Recent results are below, the most recent result is correlated with a dose of 40 mg. per week: Lab Results  Component Value Date   INR 4.00 11/27/2012   INR 1.90 11/06/2012   INR 3.60 10/30/2012    ANTI-COAG DOSE:   Latest dosing instructions   Total Sun Mon Tue Wed Thu Fri Sat   36 6 mg 4 mg 6 mg 4 mg 6 mg 4 mg 6 mg    (4 mg1.5) (4 mg1) (4 mg1.5) (4 mg1) (4 mg1.5) (4 mg1) (4 mg1.5)         ANTICOAG SUMMARY: Anticoagulation Episode Summary              Current INR goal 2.0-3.0 Next INR check 12/11/2012   INR from last check 4.00! 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  Patient instructed to take today's dose.  Patient verbalized understanding of these instructions.        FOLLOW-UP Return in 2 weeks (on 12/11/2012) for Follow up INR at 1130h.  Hulen Luster, III Pharm.D., CACP

## 2012-11-28 ENCOUNTER — Telehealth: Payer: Self-pay | Admitting: *Deleted

## 2012-11-28 MED ORDER — IPRATROPIUM-ALBUTEROL 20-100 MCG/ACT IN AERS: 1.0000 | INHALATION_SPRAY | Freq: Four times a day (QID) | RESPIRATORY_TRACT | Status: DC | PRN

## 2012-11-28 NOTE — Telephone Encounter (Signed)
Fax from [employer] - Combivent is on back order; requesting it to be changed to Combivent Respimet. Thanks

## 2012-11-30 MED ORDER — PRAVASTATIN SODIUM 20 MG PO TABS: 20.0000 mg | ORAL_TABLET | Freq: Every day | ORAL | Status: DC

## 2012-11-30 NOTE — Addendum Note (Signed)
Addended by: Doneen Poisson D on: 11/30/2012 11:37 AM   Modules accepted: Orders

## 2012-11-30 NOTE — Progress Notes (Signed)
Patient ID: Kristin Knight, female   DOB: Jan 27, 1946, 67 y.o.   MRN: [id number]  BMP unremarkable with eGFR of 82  TSH 0.341, she usually runs slightly low and has had a normal simultaneous T4 so we will continue to follow  CBC: H/H 10.4/31.6, EBC 5.3, Plts 157, MCV 89.  Unclear cause of her long standing normocytic anemia.  Iron profile was done 1 1/2 years ago and was unremarkable although the ferritin was on the low end of normal.  We will repeat the iron panel and ferritin at the next visit to assess for any evidence of iron deficiency that me result in additional evaluation.  Total choleterol 192 Triglycerides 88 HDL 60 LDL 114 With PVOD we need to get the LDL < 100.  I will restart the pravastatin.

## 2012-12-04 ENCOUNTER — Ambulatory Visit (INDEPENDENT_AMBULATORY_CARE_PROVIDER_SITE_OTHER): Payer: Medicare Other | Admitting: Physician Assistant

## 2012-12-04 ENCOUNTER — Encounter: Payer: Self-pay | Admitting: Physician Assistant

## 2012-12-04 ENCOUNTER — Other Ambulatory Visit: Payer: Self-pay | Admitting: *Deleted

## 2012-12-04 ENCOUNTER — Telehealth: Payer: Self-pay | Admitting: *Deleted

## 2012-12-04 VITALS — BP 110/60 | HR 60 | Ht 64.0 in | Wt 104.0 lb

## 2012-12-04 DIAGNOSIS — Z7901 Long term (current) use of anticoagulants: Secondary | ICD-10-CM

## 2012-12-04 DIAGNOSIS — Z1211 Encounter for screening for malignant neoplasm of colon: Secondary | ICD-10-CM

## 2012-12-04 MED ORDER — MOVIPREP 100 G PO SOLR: 1.0000 | Freq: Once | ORAL | Status: AC

## 2012-12-04 NOTE — Patient Instructions (Addendum)
You have been scheduled for a colonoscopy with propofol. Please follow written instructions given to you at your visit today.  Please pick up your prep kit at the pharmacy within the next 1-3 days. If you use inhalers (even only as needed) or a CPAP machine, please bring them with you on the day of your procedure.  We sent a prescription for the colonoscopy prep to [facility].  We will contact Dr. Hulen Luster and call you after he gets back to [place] regarding the coumadin medication.

## 2012-12-04 NOTE — Telephone Encounter (Signed)
I called Dr. Hulen Luster ( Pharm MD ) and left him a message asking him to get back to me regarding the management of Coumadin for this patient.  She is scheduled for a colonoscopy  withh Dr. Russella Dar on 12-28-2012.

## 2012-12-04 NOTE — Progress Notes (Signed)
Reviewed and agree with management plan.  Sergei Delo T. Zakari Bathe, MD FACG 

## 2012-12-04 NOTE — Telephone Encounter (Signed)
Second letter regarding coumadin clearance.

## 2012-12-04 NOTE — Progress Notes (Signed)
Subjective:    Patient ID: Kristin Knight, female    DOB: 08-Mar-1946, 67 y.o.   MRN: [id number]  HPI Jamiya is a pleasant 67 year old AA female referred today by her primary care provider for consideration of screening colonoscopy. She has not had any prior colonoscopic evaluation. 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 She is now being maintained on chronic Coumadin Also with history of hypertension depression COPD and remote EtOH abuse She has no family history of colon cancer polyps that she is aware of. She currently denies any problems with abdominal pain, changes in bowel habits melena or hematochezia. She has had trouble with her appetite for some time and has had some intermittent weight loss over the past couple of years. Last labs were checked about a week ago showing a hemoglobin of 13.3 hematocrit of 40.3 and an MCV of 85, INR was 4, at that time She is followed in the Coumadin clinic through Internal medicine at [facility].    Review of Systems  Constitutional: Positive for appetite change and unexpected weight change.  HENT: Negative.   Eyes: Negative.   Respiratory: Negative.   Cardiovascular: Negative.   Gastrointestinal: Negative.   Genitourinary: Negative.   Musculoskeletal: Negative.   Skin: Negative.   Neurological: Negative.   Hematological: Negative.   Psychiatric/Behavioral: Positive for dysphoric mood.   Outpatient Prescriptions Prior to Visit  Medication Sig Dispense Refill  . baclofen (LIORESAL) 10 MG tablet Take 1 tablet (10 mg total) by mouth 3 (three) times daily as needed.  90 tablet  11  . FLUoxetine (PROZAC) 20 MG capsule Take 1 capsule (20 mg total) by mouth daily.  30 capsule  11  . fluticasone (FLONASE) 50 MCG/ACT nasal spray Place 2 sprays into the nose daily.  16 g  2  .  Ipratropium-Albuterol (COMBIVENT RESPIMAT) 20-100 MCG/ACT AERS respimat Inhale 1 puff into the lungs 4 (four) times daily as needed for wheezing.  4 g  5  . lisinopril (PRINIVIL,ZESTRIL) 20 MG tablet Take 1 tablet (20 mg total) by mouth daily.  31 tablet  5  . pravastatin (PRAVACHOL) 20 MG tablet Take 1 tablet (20 mg total) by mouth daily.  30 tablet  11  . triamterene-hydrochlorothiazide (MAXZIDE-25) 37.5-25 MG per tablet Take 1 each (1 tablet total) by mouth daily.  30 tablet  11  . warfarin (COUMADIN) 4 MG tablet Take 1 and 1/2 tabs of your 4mg  tablet on Tuesdays; Wednesdays; Fridays, Saturdays, and Sundays. On Monday and Thursdays take only 1 tab.  50 tablet  2   Last reviewed on 12/04/2012  1:16 PM by Sammuel Cooper, PA  Allergies  Allergen Reactions  . Meperidine Hcl Other (See Comments)    nevousness  . Penicillins Other (See Comments)    Patient passed out   Patient Active Problem List  Diagnosis  . Depression  . Essential hypertension  . GLUCOSE INTOLERANCE, HX OF  . Tobacco abuse  . Anemia, normocytic normochromic  . Peripheral arterial occlusive disease  . Preventative health care  . Long term (current) use of anticoagulants  . Seasonal allergies  . Muscle spasms of neck  . Vascular graft thrombosis   History  Substance Use Topics  . Smoking status: Current Every Day Smoker -- 0.5 packs/day for 20 years    Types: Cigarettes  . Smokeless  tobacco: Never Used     Comment: PATIET HAS QUIT BEFORE / RESTARTED  . Alcohol Use: No   family history includes Breast cancer (age of onset:36) in her mother; Cancer (age of onset:63) in her brother; Colon cancer in her cousin; Healthy in her brother, daughters, and sons; Heart attack (age of onset:50) in her sisters; Heart attack (age of onset:55) in her brother and father; Heart attack (age of onset:57) in her brother; and Hypertension in her daughter.     Objective:   Physical Exam well-developed older African American female in  no acute distress, pleasant, very thin. Blood pressure 110/60 pulse 60 height 5 foot 4 weight 104. HEENT; nontraumatic normocephalic EOMI PERRLA sclera anicteric, Neck;Supple no JVD, Cardiovascular; regular rate and rhythm with S1-S2 no significant murmur rub or gallop, Pulmonary; clear bilaterally, Abdomen; soft bowel sounds are present she is nontender there is no palpable mass or hepatosplenomegaly, he has a lower midline abdominal incisional scar Rectal; exam not done, Extremities; no clubbing cyanosis or edema, Psych; mood and affect normal and appropriate       Assessment & Plan:  #52 67 year old female referred for  neoplasia screening with no prior colonoscopic evaluation. 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 Will be scheduled for colonoscopy with Dr. Russella Dar Will need to obtain consent from her vascular surgeon regarding holding  Coumadin around the time of her procedure versus need for bridging with Lovenox. If Lovenox bridge advised this will be managed through the Coumadin clinic in internal medicine at [facility].

## 2012-12-04 NOTE — Telephone Encounter (Signed)
RE: Kristin Knight DOB: [birth year] MRN: [id number]   Dear Dr. Hulen Luster,    We have scheduled the above patient for an endoscopic procedure. Our records show that she is on anticoagulation therapy.   Please advise as to how long the patient may come off her therapy of Coumadin prior to the procedure, which is scheduled for 12-28-2012.  Please fax back/ or route the completed form to [facility] CMA at [phone].   Sincerely,   Ashby Dawes     Amy Esterwood PA-C

## 2012-12-07 ENCOUNTER — Encounter: Payer: Self-pay | Admitting: Vascular Surgery

## 2012-12-07 ENCOUNTER — Other Ambulatory Visit: Payer: Self-pay | Admitting: *Deleted

## 2012-12-08 ENCOUNTER — Encounter: Payer: Self-pay | Admitting: Vascular Surgery

## 2012-12-08 ENCOUNTER — Ambulatory Visit (INDEPENDENT_AMBULATORY_CARE_PROVIDER_SITE_OTHER): Payer: Medicare Other | Admitting: Vascular Surgery

## 2012-12-08 VITALS — BP 113/82 | HR 82 | Ht 64.0 in | Wt 107.0 lb

## 2012-12-08 DIAGNOSIS — I999 Unspecified disorder of circulatory system: Secondary | ICD-10-CM

## 2012-12-08 DIAGNOSIS — S4980XA Other specified injuries of shoulder and upper arm, unspecified arm, initial encounter: Secondary | ICD-10-CM

## 2012-12-08 DIAGNOSIS — M7989 Other specified soft tissue disorders: Secondary | ICD-10-CM | POA: Diagnosis not present

## 2012-12-08 DIAGNOSIS — T82868A Thrombosis of vascular prosthetic devices, implants and grafts, initial encounter: Secondary | ICD-10-CM

## 2012-12-08 DIAGNOSIS — T82898A Other specified complication of vascular prosthetic devices, implants and grafts, initial encounter: Secondary | ICD-10-CM | POA: Diagnosis not present

## 2012-12-08 DIAGNOSIS — S46009A Unspecified injury of muscle(s) and tendon(s) of the rotator cuff of unspecified shoulder, initial encounter: Secondary | ICD-10-CM

## 2012-12-08 DIAGNOSIS — I998 Other disorder of circulatory system: Secondary | ICD-10-CM

## 2012-12-08 DIAGNOSIS — Z48812 Encounter for surgical aftercare following surgery on the circulatory system: Secondary | ICD-10-CM

## 2012-12-08 NOTE — Addendum Note (Signed)
Addended by: Sharee Pimple on: 12/08/2012 11:55 AM   Modules accepted: Orders

## 2012-12-08 NOTE — Progress Notes (Signed)
[facility] [place]  Established Previous Bypass  History of Present Illness  Kristin Knight is a 67 y.o. (05-07-1946) female who presents with chief complaint: right shoulder pain.  Previous operation(s) include: L CFA to AK pop bypass (Date: 12/11/10).  This bypass occluded suddenly and the patient had a thrombolysis of that graft on 09/14/12 which was successful without any complications.  The patient's symptoms have resolved again.  She also notes some pain at the right groin cannulation site.  The patient's treatment regimen currently included: maximal medical management and Coumadin.  The patient notes her right shoulder pain increases with raising right arm.  She is unaware of any traumatic injuries to that arm.  Also the patient complains of intermittent swelling in the left leg.  This swelling is gone in the AM but increases as the day goes along.  She has had 6 children and no history of DVT or lymphedema.  Past Medical History, Past Surgical History, Social History, Family History, Medications, Allergies, and Review of Systems are unchanged from previous evaluation on 09/13/12.  Physical Examination  Filed Vitals:   12/08/12 0927  BP: 113/82  Pulse: 82  Height: 5\' 4"  (1.626 m)  Weight: 107 lb (48.535 kg)  SpO2: 100%   Body mass index is 18.37 kg/(m^2).  General: A&O x 3, WDWN  Pulmonary: Sym exp, good air movt, CTAB, no rales, rhonchi, & wheezing  Cardiac: RRR, Nl S1, S2, no Murmurs, rubs or gallops  Vascular: Vessel Right Left  Radial Palpable Palpable  Brachial Palpable Palpable  Carotid Palpable, without bruit Palpable, without bruit  Aorta Non-palpable N/A  Femoral Palpable Palpable  Popliteal Non-palpable Non-palpable  PT Palpable Palpable  DP Palpable Palpable   Musculoskeletal: M/S 5/5 throughout , Extremities without ischemic changes , Full PROM, painful adduction or R arm, limited AROM, - dislocation, no apprehension  Neurologic: Pain  and light touch intact in extremities , Motor exam as listed above  Medical Decision Making  Kristin Knight is a 67 y.o. female who presents with: s/p thrombolysis of L fem-AK pop BPG , likely R rotator cuff injury , L leg swelling (possible CVI)   I will defer mgmt of the rotator cuff injury to the pt's PCP.  PT would likely be beneficial.  Based on the patient's vascular studies and examination, I have offered the patient: ABI, L graft duplex, L venous insufficiency.  I discussed in depth with the patient the nature of atherosclerosis, and emphasized the importance of maximal medical management including strict control of blood pressure, blood glucose, and lipid levels, obtaining regular exercise, and cessation of smoking.  The patient is aware that without maximal medical management the underlying atherosclerotic disease process will progress, limiting the benefit of any interventions.  I discussed in depth with the patient the need for long term surveillance to improve the primary assisted patency of his bypass.  The patient agrees to cooperate with such.  The patient is scheduled for the previous listed surveillance studies in 1 months.  Thank you for allowing [place] to participate in this patient's care.  Leonides Sake, MD [facility] [place] Office: [phone] Pager: [phone]  12/08/2012, 10:00 AM

## 2012-12-11 ENCOUNTER — Ambulatory Visit: Payer: Medicare Other

## 2012-12-18 ENCOUNTER — Ambulatory Visit (INDEPENDENT_AMBULATORY_CARE_PROVIDER_SITE_OTHER): Payer: Medicare Other | Admitting: Pharmacist

## 2012-12-18 DIAGNOSIS — Z5181 Encounter for therapeutic drug level monitoring: Secondary | ICD-10-CM

## 2012-12-18 DIAGNOSIS — I743 Embolism and thrombosis of arteries of the lower extremities: Secondary | ICD-10-CM | POA: Diagnosis not present

## 2012-12-18 DIAGNOSIS — Z95828 Presence of other vascular implants and grafts: Secondary | ICD-10-CM

## 2012-12-18 DIAGNOSIS — I779 Disorder of arteries and arterioles, unspecified: Secondary | ICD-10-CM

## 2012-12-18 DIAGNOSIS — Z7901 Long term (current) use of anticoagulants: Secondary | ICD-10-CM

## 2012-12-18 DIAGNOSIS — Z9889 Other specified postprocedural states: Secondary | ICD-10-CM | POA: Diagnosis not present

## 2012-12-18 LAB — POCT INR: INR: 3.8

## 2012-12-18 NOTE — Patient Instructions (Signed)
Patient instructed to take medications as defined in the Anti-coagulation Track section of this encounter.  Patient instructed to take today's dose.  Patient verbalized understanding of these instructions.    

## 2012-12-18 NOTE — Progress Notes (Signed)
Anti-Coagulation Progress Note  [name] is a 67 y.o. female who is currently on an anti-coagulation regimen.    RECENT RESULTS: Recent results are below, the most recent result is correlated with a dose of 36 mg. per week: Lab Results  Component Value Date   INR 3.80 12/18/2012   INR 4.00 11/27/2012   INR 1.90 11/06/2012    ANTI-COAG DOSE:   Latest dosing instructions   Total Sun Mon Tue Wed Thu Fri Sat   32 6 mg 4 mg 4 mg 6 mg 4 mg 4 mg 4 mg    (4 mg1.5) (4 mg1) (4 mg1) (4 mg1.5) (4 mg1) (4 mg1) (4 mg1)         ANTICOAG SUMMARY: Anticoagulation Episode Summary              Current INR goal 2.0-3.0 Next INR check 01/01/2013   INR from last check 3.80! 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  Patient instructed to take today's dose.  Patient verbalized understanding of these instructions.        FOLLOW-UP Return in 2 weeks (on 01/01/2013) for Follow up INR at 2PM.  Hulen Luster, III Pharm.D., CACP

## 2012-12-19 NOTE — Progress Notes (Signed)
Agree 

## 2012-12-20 ENCOUNTER — Telehealth: Payer: Self-pay | Admitting: *Deleted

## 2012-12-20 NOTE — Telephone Encounter (Signed)
I paged Dr. Hulen Luster Pharmacist. This patient sees him for her management of coumadin. He called me back here at [facility].  He saw her on 12-18-2012.  I asked him if she could hold the coumadin for 5 days prior to the colonoscopy procedure scheduled for 12-28-2012.  He said she could hold it for 5 days prior to the procedure.  If no biopsies taken , she can resume it the day after the procedure.  If biopsies taken, he would like her to hold it for an additional 3 days.  He asked me to send him a staff message with this and he will comment on it and send it back to me.

## 2012-12-20 NOTE — Telephone Encounter (Signed)
LM for Kristin Knight and asked her to please call me.  I told her to stop the coumadin on 2-8 and stay off it until we give her further instructions.  She still has the appointment for the colonoscopy  On 12-28-2012.  I left my name and number asking her to call me. I have further instructions from Dr. Hulen Luster.

## 2012-12-20 NOTE — Telephone Encounter (Signed)
Libertie called me back and I gave her the instrucitons from Dr. Alexandria Lodge.  She repeated them back to me.  I told her after her procedure, before leaving the [facility], clarify if there were any biopsies taken or  Not.  If no biopsies taken she can resume the coumadin the day after the procedure.  If biopsies taken, she can stay off the coumadin also on the 14th, 15th and the 16th.  She understood the instructions.

## 2012-12-27 ENCOUNTER — Telehealth: Payer: Self-pay | Admitting: Gastroenterology

## 2012-12-27 NOTE — Telephone Encounter (Signed)
Patient advised that we will reschedule her procedure to 12/29/12 11:00.  I verbally instructed time changes with the patient and she verbalized understanding.  Dr. Alexandria Lodge had given instructions the patient could be off coumadin until 12/31/12.  Reviewed with Mike Gip PA and she agrees with the plan.

## 2012-12-28 ENCOUNTER — Encounter: Payer: Medicare Other | Admitting: Gastroenterology

## 2012-12-29 ENCOUNTER — Encounter: Payer: Medicare Other | Admitting: Gastroenterology

## 2013-01-01 ENCOUNTER — Telehealth: Payer: Self-pay

## 2013-01-01 ENCOUNTER — Ambulatory Visit (INDEPENDENT_AMBULATORY_CARE_PROVIDER_SITE_OTHER): Payer: Medicare Other | Admitting: Pharmacist

## 2013-01-01 DIAGNOSIS — Z5181 Encounter for therapeutic drug level monitoring: Secondary | ICD-10-CM

## 2013-01-01 DIAGNOSIS — I743 Embolism and thrombosis of arteries of the lower extremities: Secondary | ICD-10-CM | POA: Diagnosis not present

## 2013-01-01 DIAGNOSIS — Z7901 Long term (current) use of anticoagulants: Secondary | ICD-10-CM | POA: Diagnosis not present

## 2013-01-01 DIAGNOSIS — Z9889 Other specified postprocedural states: Secondary | ICD-10-CM | POA: Diagnosis not present

## 2013-01-01 DIAGNOSIS — I779 Disorder of arteries and arterioles, unspecified: Secondary | ICD-10-CM

## 2013-01-01 DIAGNOSIS — Z95828 Presence of other vascular implants and grafts: Secondary | ICD-10-CM

## 2013-01-01 LAB — POCT INR: INR: 1.4

## 2013-01-01 NOTE — Progress Notes (Signed)
Anti-Coagulation Progress Note  Coni Homesley is a 67 y.o. female who is currently on an anti-coagulation regimen.    RECENT RESULTS: Recent results are below, the most recent result is correlated with a dose of 32 mg. per week BUT with interruption/holding warfarin 5 days prior to last Thursday 13-FEB-14 at which time she was to have undergone colonoscopy by [facility]. Because of weather/snow and office being closed, the procedure was cancelled and patient was advised by [facility] to RESUME warfarin at previous dosing instructions provided by [facility]. She recommenced on Friday 14-FEB-14 and has had only 3 doses since resumption of warfarin accounting for todays low INR. Patient will continue her usual regimen until she is advised by phone call by [facility] WHEN her procedure is to be re-scheduled. Patient understands that upon learning this date--she is to DISCONTINUE/STOP warfarin FIVE (5) days PRIOR TO PLANNED COLONOSCOPY DATE THE PRACTICE TELLS HER. She verbalized understanding of this and we worked through several examples for which she could correctly identify her stop date based upon her procedure date.  AFTER procedure--when advised by GI Medicine, she will resume warfarin and then contact me to make an appointment for warfarin follow up. This plan was left as a voice mail for Cordelia Pen, [place] at [facility] today at [phone] Lab Results  Component Value Date   INR 1.40 01/01/2013   INR 3.80 12/18/2012   INR 4.00 11/27/2012    ANTI-COAG DOSE: Anticoagulation Dose Instructions as of 01/01/2013     [name] Tue Wed Thu Fri Sat   New Dose 6 mg 4 mg 4 mg 6 mg 4 mg 4 mg 4 mg       ANTICOAG SUMMARY: Anticoagulation Episode Summary   Current INR goal 2.0-3.0  Next INR check 01/29/2013  INR from last check 1.40! 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  Patient instructed to take today's dose.  Patient verbalized understanding of these instructions.       FOLLOW-UP Return in 4 weeks (on 01/29/2013) for Follow up INR at 2PM.  Hulen Luster, III Pharm.D., CACP

## 2013-01-01 NOTE — Telephone Encounter (Signed)
I have left a message for Dr. Alexandria Lodge to call with directions for her coumadin.  Patient was unable to have colonoscopy on Friday due to snow.

## 2013-01-01 NOTE — Telephone Encounter (Signed)
I had a detailed message from Dr. Alexandria Lodge.  It is ok to reschedule the colonoscopy and hold her coumadin for 5 days prior.  The patient is rescheduled to 01/26/13 2:00.  She is aware I will send her directions in the mail and to hold her coumadin starting 01/21/13

## 2013-01-01 NOTE — Patient Instructions (Signed)
Patient instructed to take medications as defined in the Anti-coagulation Track section of this encounter.  Patient instructed to take today's dose.  Patient verbalized understanding of these instructions.    

## 2013-01-04 ENCOUNTER — Encounter: Payer: Self-pay | Admitting: Vascular Surgery

## 2013-01-05 ENCOUNTER — Encounter (INDEPENDENT_AMBULATORY_CARE_PROVIDER_SITE_OTHER): Payer: Medicare Other | Admitting: *Deleted

## 2013-01-05 ENCOUNTER — Ambulatory Visit (INDEPENDENT_AMBULATORY_CARE_PROVIDER_SITE_OTHER): Payer: Medicare Other | Admitting: Vascular Surgery

## 2013-01-05 ENCOUNTER — Encounter: Payer: Self-pay | Admitting: Vascular Surgery

## 2013-01-05 VITALS — BP 137/91 | HR 58 | Ht 64.0 in | Wt 106.0 lb

## 2013-01-05 DIAGNOSIS — I739 Peripheral vascular disease, unspecified: Secondary | ICD-10-CM

## 2013-01-05 DIAGNOSIS — I70229 Atherosclerosis of native arteries of extremities with rest pain, unspecified extremity: Secondary | ICD-10-CM

## 2013-01-05 DIAGNOSIS — I872 Venous insufficiency (chronic) (peripheral): Secondary | ICD-10-CM | POA: Diagnosis not present

## 2013-01-05 DIAGNOSIS — Z48812 Encounter for surgical aftercare following surgery on the circulatory system: Secondary | ICD-10-CM | POA: Diagnosis not present

## 2013-01-05 DIAGNOSIS — I998 Other disorder of circulatory system: Secondary | ICD-10-CM

## 2013-01-05 DIAGNOSIS — R609 Edema, unspecified: Secondary | ICD-10-CM

## 2013-01-05 DIAGNOSIS — T82898A Other specified complication of vascular prosthetic devices, implants and grafts, initial encounter: Secondary | ICD-10-CM

## 2013-01-05 DIAGNOSIS — T82868A Thrombosis of vascular prosthetic devices, implants and grafts, initial encounter: Secondary | ICD-10-CM

## 2013-01-05 DIAGNOSIS — M7989 Other specified soft tissue disorders: Secondary | ICD-10-CM

## 2013-01-05 NOTE — Progress Notes (Signed)
[facility] [place]   Established Previous Bypass   History of Present Illness  Madisyn Mawhinney is a 67 y.o. (04-30-1946) female who presents with chief complaint: routine surveillance. Previous operation(s) include: L CFA to AK Pop bypass (Date: 12/09/10) and successful thrombolysis on 09/14/12. The patient's leg symptoms have resolved currently.  Her left leg swelling is improved with use of compression stockings.  The patient's treatment regimen currently included: maximal medical management and Coumadin.   Past Medical History, Past Surgical History, Social History, Family History, Medications, Allergies, and Review of Systems are unchanged from previous evaluation on 12/08/12  Physical Examination  Filed Vitals:   01/05/13 1122  BP: 137/91  Pulse: 58  Height: 5\' 4"  (1.626 m)  Weight: 106 lb (48.081 kg)  SpO2: 100%   Body mass index is 18.19 kg/(m^2).  General: A&O x 3, WD, thin  Pulmonary: Sym exp, good air movt, CTAB, no rales, rhonchi, & wheezing   Cardiac: RRR, Nl S1, S2, no Murmurs, rubs or gallops   Vascular:  Vessel  Right  Left   Radial  Palpable  Palpable   Brachial  Palpable  Palpable   Carotid  Palpable, without bruit  Palpable, without bruit   Aorta  Non-palpable  N/A   Femoral  Palpable  Palpable   Popliteal  Non-palpable  Non-palpable   PT  Palpable  Palpable   DP  Palpable  Palpable    Musculoskeletal: M/S 5/5 throughout , Extremities without ischemic changes , no edema in either leg  Neurologic: Pain and light touch intact in extremities , Motor exam as listed above   Non-Invasive Vascular Imaging   LLE Venous Insufficiency Duplex (Date: 01/05/13)  No DVT  Deep reflux in L CFV  No GSV reflux  ABI (Date: 01/05/13) RLE: 1.00, PT: biphasic, DP: monophasic LLE: 1.08, PT: triphasic , DP: monophasic  LLE graft Duplex (Date: 01/05/13) CFA: PSV 125 c/s  Fem-pop graft: 37-73 c/s triphasic throughout Widely patent graft without any  lesions noted  Medical Decision Making  Oluwakemi Salsberry is a 67 y.o. 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 If continued stable findings, would stretch out surveillance to q6 months I discussed in depth with the patient the nature of atherosclerosis, and emphasized the importance of maximal medical management including strict control of blood pressure, blood glucose, and lipid levels, obtaining regular exercise, and cessation of smoking. The patient is aware that without maximal medical management the underlying atherosclerotic disease process will progress, limiting the benefit of any interventions.  I would continue Coumadin to assist with patency.  Prior to the the previous thrombosis, there was no suggestion that disease had progressed.  On today's ABI, some deterioration in DP blood flow is noted but PT remains triphasic. I discussed in depth with the patient the need for long term surveillance to improve the primary assisted patency of his bypass. The patient agrees to cooperate with such.  The patient is scheduled for the previous listed surveillance studies in 6 months.  Thank you for allowing [place] to participate in this patient's care.  Leonides Sake, MD [facility] [place] Office: [phone] Pager: [phone]  01/05/2013, 12:26 PM

## 2013-01-09 NOTE — Addendum Note (Signed)
Addended by: Lorin Mercy K on: 01/09/2013 08:00 AM   Modules accepted: Orders

## 2013-01-12 ENCOUNTER — Encounter: Payer: Medicare Other | Admitting: Gastroenterology

## 2013-01-15 ENCOUNTER — Telehealth: Payer: Self-pay | Admitting: Gastroenterology

## 2013-01-15 MED ORDER — MOVIPREP 100 G PO SOLR: 1.0000 | Freq: Once | ORAL | Status: DC

## 2013-01-15 NOTE — Telephone Encounter (Signed)
No preps available at this time.  I have mailed her a rebate coupon

## 2013-01-26 ENCOUNTER — Encounter: Payer: Self-pay | Admitting: Gastroenterology

## 2013-01-26 ENCOUNTER — Ambulatory Visit (AMBULATORY_SURGERY_CENTER): Payer: Medicare Other | Admitting: Gastroenterology

## 2013-01-26 VITALS — BP 126/76 | HR 56 | Temp 98.3°F | Resp 19 | Ht 64.0 in | Wt 104.0 lb

## 2013-01-26 DIAGNOSIS — K648 Other hemorrhoids: Secondary | ICD-10-CM | POA: Insufficient documentation

## 2013-01-26 DIAGNOSIS — K5790 Diverticulosis of intestine, part unspecified, without perforation or abscess without bleeding: Secondary | ICD-10-CM

## 2013-01-26 DIAGNOSIS — I739 Peripheral vascular disease, unspecified: Secondary | ICD-10-CM | POA: Diagnosis not present

## 2013-01-26 DIAGNOSIS — D126 Benign neoplasm of colon, unspecified: Secondary | ICD-10-CM

## 2013-01-26 DIAGNOSIS — F329 Major depressive disorder, single episode, unspecified: Secondary | ICD-10-CM | POA: Diagnosis not present

## 2013-01-26 DIAGNOSIS — K635 Polyp of colon: Secondary | ICD-10-CM | POA: Insufficient documentation

## 2013-01-26 DIAGNOSIS — D649 Anemia, unspecified: Secondary | ICD-10-CM | POA: Diagnosis not present

## 2013-01-26 DIAGNOSIS — K279 Peptic ulcer, site unspecified, unspecified as acute or chronic, without hemorrhage or perforation: Secondary | ICD-10-CM | POA: Diagnosis not present

## 2013-01-26 DIAGNOSIS — Z1211 Encounter for screening for malignant neoplasm of colon: Secondary | ICD-10-CM | POA: Diagnosis not present

## 2013-01-26 HISTORY — DX: Other hemorrhoids: K64.8

## 2013-01-26 HISTORY — DX: Diverticulosis of intestine, part unspecified, without perforation or abscess without bleeding: K57.90

## 2013-01-26 MED ORDER — SODIUM CHLORIDE 0.9 % IV SOLN: 500.0000 mL | INTRAVENOUS | Status: DC

## 2013-01-26 NOTE — Op Note (Signed)
[facility] [address] [place], [address]   COLONOSCOPY PROCEDURE REPORT  PATIENT: Kristin Knight, Kristin Knight  MR#: [id number] BIRTHDATE: 1946-02-25 , 66  yrs. old GENDER: Female ENDOSCOPIST: Meryl Dare, MD, [facility] REFERRED [id number] Josem Kaufmann, M.D. PROCEDURE DATE:  01/26/2013 PROCEDURE:   Colonoscopy with snare polypectomy ASA CLASS:   Class III INDICATIONS:average risk screening. MEDICATIONS: MAC sedation, administered by CRNA and propofol (Diprivan) 200mg  IV DESCRIPTION OF PROCEDURE:   After the risks benefits and alternatives of the procedure were thoroughly explained, informed consent was obtained.  A digital rectal exam revealed no abnormalities of the rectum.   The LB CF-[id number]  endoscope was introduced through the anus and advanced to the cecum, which was identified by both the appendix and ileocecal valve. No adverse events experienced.   The quality of the prep was good, using MoviPrep  The instrument was then slowly withdrawn as the colon was fully examined.  COLON FINDINGS: A sessile polyp measuring 5 mm in size was found in the sigmoid colon.  A polypectomy was performed with a cold snare. The resection was complete and the polyp tissue was completely retrieved.   Mild diverticulosis was noted in the ascending colon and transverse colon.   Moderate diverticulosis was noted in the descending colon and sigmoid colon.   The colon was otherwise normal.  There was no diverticulosis, inflammation, polyps or cancers unless previously stated.  Retroflexed views revealed small internal hemorrhoids. The time to cecum=3 minutes 00 seconds. Withdrawal time=8 minutes 51 seconds.  The scope was withdrawn and the procedure completed. COMPLICATIONS: There were no complications.  ENDOSCOPIC IMPRESSION: 1.   Sessile polyp measuring 5 mm in the sigmoid colon; polypectomy performed with a cold snare 2.   Mild diverticulosis was noted in the ascending colon  and transverse colon 3.   Moderate diverticulosis was noted in the descending colon and sigmoid colon 4.   Small internal hemorrhoids  RECOMMENDATIONS: 1.  Await pathology results 2.  Resume Coumadin (warfarin) today and have your PT/INR checked within 1 week. 3.  High fiber diet with liberal fluid intake. 4.  Repeat colonoscopy in 5 years if polyp adenomatous; otherwise 10 years  eSigned:  Meryl Dare, MD, [facility] 01/26/2013 1:56 PM      PATIENT NAME:  Kristin Knight, Kristin Knight MR#: [id number]

## 2013-01-26 NOTE — Progress Notes (Signed)
Called to room to assist during endoscopic procedure.  Patient ID and intended procedure confirmed with present staff. Received instructions for my participation in the procedure from the performing physician. ewm 

## 2013-01-26 NOTE — Patient Instructions (Addendum)
Findings:  Polyp, Diverticulosis, Small Internal Hemorrhoids Recommendations:  Wait for pathology results, Resume coumadin today and have PT/INR checked in 1 week, High Fiber Diet with liberal fluid intake.  YOU HAD AN ENDOSCOPIC PROCEDURE TODAY AT THE [facility]: Refer to the procedure report that was given to you for any specific questions about what was found during the examination.  If the procedure report does not answer your questions, please call your gastroenterologist to clarify.  If you requested that your care partner not be given the details of your procedure findings, then the procedure report has been included in a sealed envelope for you to review at your convenience later.  YOU SHOULD EXPECT: Some feelings of bloating in the abdomen. Passage of more gas than usual.  Walking can help get rid of the air that was put into your GI tract during the procedure and reduce the bloating. If you had a lower endoscopy (such as a colonoscopy or flexible sigmoidoscopy) you may notice spotting of blood in your stool or on the toilet paper. If you underwent a bowel prep for your procedure, then you may not have a normal bowel movement for a few days.  DIET: Your first meal following the procedure should be a light meal and then it is ok to progress to your normal diet.  A half-sandwich or bowl of soup is an example of a good first meal.  Heavy or fried foods are harder to digest and may make you feel nauseous or bloated.  Likewise meals heavy in dairy and vegetables can cause extra gas to form and this can also increase the bloating.  Drink plenty of fluids but you should avoid alcoholic beverages for 24 hours.  ACTIVITY: Your care partner should take you home directly after the procedure.  You should plan to take it easy, moving slowly for the rest of the day.  You can resume normal activity the day after the procedure however you should NOT DRIVE or use heavy machinery for 24 hours (because  of the sedation medicines used during the test).    SYMPTOMS TO REPORT IMMEDIATELY: A gastroenterologist can be reached at any hour.  During normal business hours, 8:30 AM to 5:00 PM Monday through Friday, call [phone].  After hours and on weekends, please call the GI answering service at [phone] who will take a message and have the physician on call contact you.   Following lower endoscopy (colonoscopy or flexible sigmoidoscopy):  Excessive amounts of blood in the stool  Significant tenderness or worsening of abdominal pains  Swelling of the abdomen that is new, acute  Fever of 100F or higher  Following upper endoscopy (EGD)  Vomiting of blood or coffee ground material  New chest pain or pain under the shoulder blades  Painful or persistently difficult swallowing  New shortness of breath  Fever of 100F or higher  Black, tarry-looking stools  FOLLOW UP: If any biopsies were taken you will be contacted by phone or by letter within the next 1-3 weeks.  Call your gastroenterologist if you have not heard about the biopsies in 3 weeks.  Our staff will call the home number listed on your records the next business day following your procedure to check on you and address any questions or concerns that you may have at that time regarding the information given to you following your procedure. This is a courtesy call and so if there is no answer at the home number and we  have not heard from you through the emergency physician on call, we will assume that you have returned to your regular daily activities without incident.  SIGNATURES/CONFIDENTIALITY: You and/or your care partner have signed paperwork which will be entered into your electronic medical record.  These signatures attest to the fact that that the information above on your After Visit Summary has been reviewed and is understood.  Full responsibility of the confidentiality of this discharge information lies with you and/or  your care-partner.  Please follow all discharge instructions given to you by the recovery room nurse. If you have any questions or problems after discharge please call one of the numbers listed above. You will receive a phone call in the am to see how you are doing and answer any questions you may have. Thank you for choosing [facility] for your health care needs.

## 2013-01-26 NOTE — Progress Notes (Signed)
Patient did not experience any of the following events: a burn prior to discharge; a fall within the facility; wrong site/side/patient/procedure/implant event; or a hospital transfer or hospital admission upon discharge from the facility. (G8907) Patient did not have preoperative order for IV antibiotic SSI prophylaxis. (G8918)  

## 2013-01-26 NOTE — Progress Notes (Signed)
Report to pacu rn, vss, bbs=clear 

## 2013-01-29 ENCOUNTER — Ambulatory Visit: Payer: Medicare Other

## 2013-01-29 ENCOUNTER — Telehealth: Payer: Self-pay | Admitting: *Deleted

## 2013-01-29 NOTE — Telephone Encounter (Signed)
Number identifier, left message, follow-up  

## 2013-01-30 ENCOUNTER — Encounter: Payer: Self-pay | Admitting: Internal Medicine

## 2013-01-31 ENCOUNTER — Encounter: Payer: Self-pay | Admitting: Internal Medicine

## 2013-02-04 ENCOUNTER — Encounter: Payer: Self-pay | Admitting: Gastroenterology

## 2013-02-05 ENCOUNTER — Ambulatory Visit (INDEPENDENT_AMBULATORY_CARE_PROVIDER_SITE_OTHER): Payer: Medicare Other | Admitting: Pharmacist

## 2013-02-05 DIAGNOSIS — I743 Embolism and thrombosis of arteries of the lower extremities: Secondary | ICD-10-CM

## 2013-02-05 DIAGNOSIS — Z7901 Long term (current) use of anticoagulants: Secondary | ICD-10-CM

## 2013-02-05 DIAGNOSIS — Z9889 Other specified postprocedural states: Secondary | ICD-10-CM

## 2013-02-05 DIAGNOSIS — I779 Disorder of arteries and arterioles, unspecified: Secondary | ICD-10-CM

## 2013-02-05 DIAGNOSIS — Z5181 Encounter for therapeutic drug level monitoring: Secondary | ICD-10-CM

## 2013-02-05 DIAGNOSIS — Z95828 Presence of other vascular implants and grafts: Secondary | ICD-10-CM

## 2013-02-05 NOTE — Patient Instructions (Signed)
Patient instructed to take medications as defined in the Anti-coagulation Track section of this encounter.  Patient instructed to take today's dose.  Patient verbalized understanding of these instructions.    

## 2013-02-05 NOTE — Progress Notes (Signed)
Anti-Coagulation Progress Note  Kristin Knight is a 67 y.o. female who is currently on an anti-coagulation regimen.    RECENT RESULTS: Recent results are below, the most recent result is correlated with a dose of 32 mg. per week: Lab Results  Component Value Date   INR 2.80 02/05/2013   INR 1.40 01/01/2013   INR 3.80 12/18/2012    ANTI-COAG DOSE: Anticoagulation Dose Instructions as of 02/05/2013     [name] Tue Wed Thu Fri Sat   New Dose 6 mg 4 mg 4 mg 6 mg 4 mg 4 mg 4 mg       ANTICOAG SUMMARY: Anticoagulation Episode Summary   Current INR goal 2.0-3.0  Next INR check 03/05/2013  INR from last check 2.80 (02/05/2013)  Weekly max dose   Target end date   [facility]  Preferred lab   Send INR reminders to    Indications  Peripheral arterial occlusive disease [444.22] Long term (current) use of anticoagulants [V58.61]        Comments         ANTICOAG TODAY: Anticoagulation Summary as of 02/05/2013   INR goal 2.0-3.0  Selected INR 2.80 (02/05/2013)  Next INR check 03/05/2013  Target end date    Indications  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  Patient instructed to take today's dose.  Patient verbalized understanding of these instructions.       FOLLOW-UP Return in 4 weeks (on 03/05/2013) for Follow up INR at 2:15PM.  Hulen Luster, III Pharm.D., CACP

## 2013-03-05 ENCOUNTER — Ambulatory Visit: Payer: Medicare Other

## 2013-03-12 ENCOUNTER — Ambulatory Visit (INDEPENDENT_AMBULATORY_CARE_PROVIDER_SITE_OTHER): Payer: Medicare Other | Admitting: Pharmacist

## 2013-03-12 DIAGNOSIS — Z95828 Presence of other vascular implants and grafts: Secondary | ICD-10-CM

## 2013-03-12 DIAGNOSIS — I779 Disorder of arteries and arterioles, unspecified: Secondary | ICD-10-CM

## 2013-03-12 DIAGNOSIS — I743 Embolism and thrombosis of arteries of the lower extremities: Secondary | ICD-10-CM

## 2013-03-12 DIAGNOSIS — Z9889 Other specified postprocedural states: Secondary | ICD-10-CM | POA: Diagnosis not present

## 2013-03-12 DIAGNOSIS — Z5181 Encounter for therapeutic drug level monitoring: Secondary | ICD-10-CM

## 2013-03-12 DIAGNOSIS — Z7901 Long term (current) use of anticoagulants: Secondary | ICD-10-CM | POA: Diagnosis not present

## 2013-03-12 MED ORDER — WARFARIN SODIUM 4 MG PO TABS: ORAL_TABLET | ORAL | Status: DC

## 2013-03-12 NOTE — Progress Notes (Signed)
Anti-Coagulation Progress Note  Kristin Knight is a 67 y.o. female who is currently on an anti-coagulation regimen.    RECENT RESULTS: Recent results are below, the most recent result is correlated with a dose of 32 mg. per week: Lab Results  Component Value Date   INR 4.00 03/12/2013   INR 2.80 02/05/2013   INR 1.40 01/01/2013    ANTI-COAG DOSE: Anticoagulation Dose Instructions as of 03/12/2013     [name] Tue Wed Thu Fri Sat   New Dose 4 mg 4 mg 4 mg 4 mg 4 mg 4 mg 4 mg       ANTICOAG SUMMARY: Anticoagulation Episode Summary   Current INR goal 2.0-3.0  Next INR check 04/02/2013  INR from last check 4.00! (03/12/2013)  Weekly max dose   Target end date   [facility]  Preferred lab   Send INR reminders to    Indications  Peripheral arterial occlusive disease [444.22] Long term (current) use of anticoagulants [V58.61]        Comments         ANTICOAG TODAY: Anticoagulation Summary as of 03/12/2013   INR goal 2.0-3.0  Selected INR 4.00! (03/12/2013)  Next INR check 04/02/2013  Target end date    Indications  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  Patient instructed to take today's dose.  Patient verbalized understanding of these instructions.       FOLLOW-UP Return in 3 weeks (on 04/02/2013) for Follow up INR at 2PM.  Hulen Luster, III Pharm.D., CACP

## 2013-03-12 NOTE — Patient Instructions (Signed)
Patient instructed to take medications as defined in the Anti-coagulation Track section of this encounter.  Patient instructed to take today's dose.  Patient verbalized understanding of these instructions.    

## 2013-03-12 NOTE — Addendum Note (Signed)
Addended by: Hulen Luster B on: 03/12/2013 03:44 PM   Modules accepted: Orders

## 2013-03-29 ENCOUNTER — Encounter: Payer: Self-pay | Admitting: Vascular Surgery

## 2013-03-30 ENCOUNTER — Ambulatory Visit: Payer: Medicare Other | Admitting: Vascular Surgery

## 2013-04-02 ENCOUNTER — Ambulatory Visit (INDEPENDENT_AMBULATORY_CARE_PROVIDER_SITE_OTHER): Payer: Medicare Other | Admitting: Pharmacist

## 2013-04-02 DIAGNOSIS — Z7901 Long term (current) use of anticoagulants: Secondary | ICD-10-CM

## 2013-04-02 DIAGNOSIS — Z95828 Presence of other vascular implants and grafts: Secondary | ICD-10-CM

## 2013-04-02 DIAGNOSIS — I779 Disorder of arteries and arterioles, unspecified: Secondary | ICD-10-CM

## 2013-04-02 DIAGNOSIS — Z9889 Other specified postprocedural states: Secondary | ICD-10-CM

## 2013-04-02 DIAGNOSIS — I743 Embolism and thrombosis of arteries of the lower extremities: Secondary | ICD-10-CM

## 2013-04-02 NOTE — Patient Instructions (Signed)
Patient instructed to take medications as defined in the Anti-coagulation Track section of this encounter.  Patient instructed to take today's dose.  Patient verbalized understanding of these instructions.    

## 2013-04-02 NOTE — Progress Notes (Signed)
Anti-Coagulation Progress Note  Kristin Knight is a 67 y.o. female who is currently on an anti-coagulation regimen.    RECENT RESULTS: Recent results are below, the most recent result is correlated with a dose of 28 mg. per week: Lab Results  Component Value Date   INR 1.60 04/02/2013   INR 4.00 03/12/2013   INR 2.80 02/05/2013    ANTI-COAG DOSE: Anticoagulation Dose Instructions as of 04/02/2013     [name] Tue Wed Thu Fri Sat   New Dose 4 mg 6 mg 4 mg 4 mg 4 mg 4 mg 4 mg       ANTICOAG SUMMARY: Anticoagulation Episode Summary   Current INR goal 2.0-3.0  Next INR check 04/23/2013  INR from last check 1.60! (04/02/2013)  Weekly max dose   Target end date   [facility]  Preferred lab   Send INR reminders to    Indications  Peripheral arterial occlusive disease [444.22] Long term (current) use of anticoagulants [V58.61]        Comments         ANTICOAG TODAY: Anticoagulation Summary as of 04/02/2013   INR goal 2.0-3.0  Selected INR 1.60! (04/02/2013)  Next INR check 04/23/2013  Target end date    Indications  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  Patient instructed to take today's dose.  Patient verbalized understanding of these instructions.       FOLLOW-UP Return in 3 weeks (on 04/23/2013) for Follow up INR at 2:15PM.  Hulen Luster, III Pharm.D., CACP

## 2013-04-23 ENCOUNTER — Ambulatory Visit (INDEPENDENT_AMBULATORY_CARE_PROVIDER_SITE_OTHER): Payer: Medicare Other | Admitting: Pharmacist

## 2013-04-23 DIAGNOSIS — I779 Disorder of arteries and arterioles, unspecified: Secondary | ICD-10-CM

## 2013-04-23 DIAGNOSIS — Z7901 Long term (current) use of anticoagulants: Secondary | ICD-10-CM

## 2013-04-23 DIAGNOSIS — Z5181 Encounter for therapeutic drug level monitoring: Secondary | ICD-10-CM

## 2013-04-23 DIAGNOSIS — Z9889 Other specified postprocedural states: Secondary | ICD-10-CM

## 2013-04-23 DIAGNOSIS — Z95828 Presence of other vascular implants and grafts: Secondary | ICD-10-CM

## 2013-04-23 DIAGNOSIS — I743 Embolism and thrombosis of arteries of the lower extremities: Secondary | ICD-10-CM

## 2013-04-23 LAB — POCT INR: INR: 3.1

## 2013-04-23 NOTE — Progress Notes (Signed)
Anti-Coagulation Progress Note  Kristin Knight is a 67 y.o. female who is currently on an anti-coagulation regimen.    RECENT RESULTS: Recent results are below, the most recent result is correlated with a dose of 30 mg. per week: Lab Results  Component Value Date   INR 3.10 04/23/2013   INR 1.60 04/02/2013   INR 4.00 03/12/2013    ANTI-COAG DOSE: Anticoagulation Dose Instructions as of 04/23/2013     [name] Tue Wed Thu Fri Sat   New Dose 4 mg 4 mg 4 mg 4 mg 4 mg 4 mg 4 mg       ANTICOAG SUMMARY: Anticoagulation Episode Summary   Current INR goal 2.0-3.0  Next INR check 05/21/2013  INR from last check 3.10! 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  Patient instructed to take today's dose.  Patient verbalized understanding of these instructions.       FOLLOW-UP Return in 4 weeks (on 05/21/2013) for Follow up INR at 2:30PM.  Hulen Luster, III Pharm.D., CACP

## 2013-04-23 NOTE — Patient Instructions (Signed)
Patient instructed to take medications as defined in the Anti-coagulation Track section of this encounter.  Patient instructed to take today's dose.  Patient verbalized understanding of these instructions.    

## 2013-05-21 ENCOUNTER — Ambulatory Visit: Payer: Medicare Other

## 2013-05-28 ENCOUNTER — Ambulatory Visit (INDEPENDENT_AMBULATORY_CARE_PROVIDER_SITE_OTHER): Payer: Medicare Other | Admitting: Pharmacist

## 2013-05-28 DIAGNOSIS — I743 Embolism and thrombosis of arteries of the lower extremities: Secondary | ICD-10-CM | POA: Diagnosis not present

## 2013-05-28 DIAGNOSIS — I779 Disorder of arteries and arterioles, unspecified: Secondary | ICD-10-CM

## 2013-05-28 DIAGNOSIS — Z9889 Other specified postprocedural states: Secondary | ICD-10-CM

## 2013-05-28 DIAGNOSIS — Z5181 Encounter for therapeutic drug level monitoring: Secondary | ICD-10-CM

## 2013-05-28 DIAGNOSIS — Z95828 Presence of other vascular implants and grafts: Secondary | ICD-10-CM

## 2013-05-28 DIAGNOSIS — Z7901 Long term (current) use of anticoagulants: Secondary | ICD-10-CM | POA: Diagnosis not present

## 2013-05-28 LAB — POCT INR: INR: 3.3

## 2013-05-28 NOTE — Patient Instructions (Signed)
Patient instructed to take medications as defined in the Anti-coagulation Track section of this encounter.  Patient instructed to take today's dose.  Patient verbalized understanding of these instructions.    

## 2013-05-28 NOTE — Progress Notes (Signed)
Anti-Coagulation Progress Note  Kristin Knight is a 67 y.o. female who is currently on an anti-coagulation regimen.    RECENT RESULTS: Recent results are below, the most recent result is correlated with a dose of 28 mg. per week: Lab Results  Component Value Date   INR 3.3 05/28/2013   INR 3.10 04/23/2013   INR 1.60 04/02/2013    ANTI-COAG DOSE: Anticoagulation Dose Instructions as of 05/28/2013     [name] Tue Wed Thu Fri Sat   New Dose 4 mg 2 mg 4 mg 4 mg 2 mg 4 mg 4 mg       ANTICOAG SUMMARY: Anticoagulation Episode Summary   Current INR goal 2.0-3.0  Next INR check 07/02/2013  INR from last check 3.3! 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  Patient instructed to take today's dose.  Patient verbalized understanding of these instructions.       FOLLOW-UP Return in 5 weeks (on 07/02/2013) for Follow up INR at 2PM.  Hulen Luster, III Pharm.D., CACP

## 2013-06-01 ENCOUNTER — Other Ambulatory Visit: Payer: Self-pay | Admitting: Internal Medicine

## 2013-06-01 DIAGNOSIS — I10 Essential (primary) hypertension: Secondary | ICD-10-CM

## 2013-07-02 ENCOUNTER — Ambulatory Visit (INDEPENDENT_AMBULATORY_CARE_PROVIDER_SITE_OTHER): Payer: Medicare Other | Admitting: Pharmacist

## 2013-07-02 DIAGNOSIS — Z7901 Long term (current) use of anticoagulants: Secondary | ICD-10-CM | POA: Diagnosis not present

## 2013-07-02 DIAGNOSIS — Z95828 Presence of other vascular implants and grafts: Secondary | ICD-10-CM

## 2013-07-02 DIAGNOSIS — I743 Embolism and thrombosis of arteries of the lower extremities: Secondary | ICD-10-CM

## 2013-07-02 DIAGNOSIS — Z5181 Encounter for therapeutic drug level monitoring: Secondary | ICD-10-CM

## 2013-07-02 DIAGNOSIS — I779 Disorder of arteries and arterioles, unspecified: Secondary | ICD-10-CM

## 2013-07-02 DIAGNOSIS — Z9889 Other specified postprocedural states: Secondary | ICD-10-CM

## 2013-07-02 NOTE — Progress Notes (Signed)
Anti-Coagulation Progress Note  Kristin Knight is a 67 y.o. female who is currently on an anti-coagulation regimen.    RECENT RESULTS: Recent results are below, the most recent result is correlated with a dose of 24 mg. per week: Lab Results  Component Value Date   INR 1.50 07/02/2013   INR 3.3 05/28/2013   INR 3.10 04/23/2013    ANTI-COAG DOSE: Anticoagulation Dose Instructions as of 07/02/2013     [name] Tue Wed Thu Fri Sat   New Dose 4 mg 4 mg 4 mg 4 mg 4 mg 4 mg 4 mg       ANTICOAG SUMMARY: Anticoagulation Episode Summary   Current INR goal 2.0-3.0  Next INR check 07/23/2013  INR from last check 1.50! 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  Patient instructed to take today's dose.  Patient verbalized understanding of these instructions.       FOLLOW-UP Return in 3 weeks (on 07/23/2013) for Follow up INR at 2PM.  Hulen Luster, III Pharm.D., CACP

## 2013-07-02 NOTE — Patient Instructions (Signed)
Patient instructed to take medications as defined in the Anti-coagulation Track section of this encounter.  Patient instructed to take today's dose.  Patient verbalized understanding of these instructions.    

## 2013-07-07 ENCOUNTER — Emergency Department (HOSPITAL_COMMUNITY)
Admission: EM | Admit: 2013-07-07 | Discharge: 2013-07-07 | Disposition: A | Discharge status: Home / Self Care | Payer: Medicare Other | Attending: Emergency Medicine | Admitting: Emergency Medicine

## 2013-07-07 ENCOUNTER — Emergency Department (HOSPITAL_COMMUNITY): Payer: Medicare Other

## 2013-07-07 ENCOUNTER — Encounter (HOSPITAL_COMMUNITY): Payer: Self-pay | Admitting: Emergency Medicine

## 2013-07-07 DIAGNOSIS — Z862 Personal history of diseases of the blood and blood-forming organs and certain disorders involving the immune mechanism: Secondary | ICD-10-CM | POA: Insufficient documentation

## 2013-07-07 DIAGNOSIS — Z8679 Personal history of other diseases of the circulatory system: Secondary | ICD-10-CM | POA: Diagnosis not present

## 2013-07-07 DIAGNOSIS — D689 Coagulation defect, unspecified: Secondary | ICD-10-CM | POA: Insufficient documentation

## 2013-07-07 DIAGNOSIS — Z79899 Other long term (current) drug therapy: Secondary | ICD-10-CM | POA: Insufficient documentation

## 2013-07-07 DIAGNOSIS — Z87828 Personal history of other (healed) physical injury and trauma: Secondary | ICD-10-CM | POA: Diagnosis not present

## 2013-07-07 DIAGNOSIS — Z7901 Long term (current) use of anticoagulants: Secondary | ICD-10-CM | POA: Diagnosis not present

## 2013-07-07 DIAGNOSIS — R059 Cough, unspecified: Secondary | ICD-10-CM | POA: Diagnosis not present

## 2013-07-07 DIAGNOSIS — F3289 Other specified depressive episodes: Secondary | ICD-10-CM | POA: Insufficient documentation

## 2013-07-07 DIAGNOSIS — Z8739 Personal history of other diseases of the musculoskeletal system and connective tissue: Secondary | ICD-10-CM | POA: Insufficient documentation

## 2013-07-07 DIAGNOSIS — R0602 Shortness of breath: Secondary | ICD-10-CM | POA: Diagnosis not present

## 2013-07-07 DIAGNOSIS — F329 Major depressive disorder, single episode, unspecified: Secondary | ICD-10-CM | POA: Insufficient documentation

## 2013-07-07 DIAGNOSIS — Z8619 Personal history of other infectious and parasitic diseases: Secondary | ICD-10-CM | POA: Insufficient documentation

## 2013-07-07 DIAGNOSIS — I10 Essential (primary) hypertension: Secondary | ICD-10-CM | POA: Insufficient documentation

## 2013-07-07 DIAGNOSIS — Z87448 Personal history of other diseases of urinary system: Secondary | ICD-10-CM | POA: Diagnosis not present

## 2013-07-07 DIAGNOSIS — J45901 Unspecified asthma with (acute) exacerbation: Secondary | ICD-10-CM | POA: Insufficient documentation

## 2013-07-07 DIAGNOSIS — IMO0002 Reserved for concepts with insufficient information to code with codable children: Secondary | ICD-10-CM | POA: Diagnosis not present

## 2013-07-07 DIAGNOSIS — F411 Generalized anxiety disorder: Secondary | ICD-10-CM | POA: Diagnosis not present

## 2013-07-07 DIAGNOSIS — Z8639 Personal history of other endocrine, nutritional and metabolic disease: Secondary | ICD-10-CM | POA: Insufficient documentation

## 2013-07-07 DIAGNOSIS — Z880 Allergy status to penicillin: Secondary | ICD-10-CM | POA: Diagnosis not present

## 2013-07-07 DIAGNOSIS — F17200 Nicotine dependence, unspecified, uncomplicated: Secondary | ICD-10-CM | POA: Insufficient documentation

## 2013-07-07 DIAGNOSIS — R05 Cough: Secondary | ICD-10-CM | POA: Insufficient documentation

## 2013-07-07 MED ORDER — PREDNISONE 20 MG PO TABS: 60.0000 mg | ORAL_TABLET | Freq: Every day | ORAL | Status: DC

## 2013-07-07 MED ORDER — PREDNISONE 20 MG PO TABS
60.0000 mg | ORAL_TABLET | Freq: Once | ORAL | Status: AC
  Administered 2013-07-07: 60 mg via ORAL
  Filled 2013-07-07: qty 3

## 2013-07-07 MED ORDER — ALBUTEROL SULFATE (5 MG/ML) 0.5% IN NEBU
2.5000 mg | INHALATION_SOLUTION | Freq: Once | RESPIRATORY_TRACT | Status: AC
  Administered 2013-07-07: 2.5 mg via RESPIRATORY_TRACT
  Filled 2013-07-07 (×2): qty 0.5

## 2013-07-07 MED ORDER — IPRATROPIUM BROMIDE 0.02 % IN SOLN
0.5000 mg | Freq: Once | RESPIRATORY_TRACT | Status: AC
  Administered 2013-07-07: 0.5 mg via RESPIRATORY_TRACT
  Filled 2013-07-07 (×2): qty 2.5

## 2013-07-07 NOTE — ED Provider Notes (Signed)
Medical screening examination/treatment/procedure(s) were conducted as a shared visit with non-physician practitioner(s) and myself.  I personally evaluated the patient during the encounter  Patient with history of COPD presents with cough and shortness of breath. Exam reveals diminished breath sounds but no active wheezing. CXR no pneumonia. Appropriate for outpatient follow up with PCP.  Gilda Crease, MD 07/07/13 [phone]
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
## 2013-07-07 NOTE — ED Notes (Signed)
Patient transported to X-ray 

## 2013-07-07 NOTE — ED Notes (Signed)
Pt c/o shortness of breath for a couple of days. Pt reports has seasonal allergies that are accompained by shortness of breath. Pt is taking Combivent last dose at 0842 without relief. Pt reports productive cough for a couple of days.

## 2013-07-09 ENCOUNTER — Other Ambulatory Visit: Payer: Self-pay | Admitting: Internal Medicine

## 2013-07-10 NOTE — Telephone Encounter (Signed)
Refill coumadin

## 2013-07-11 ENCOUNTER — Other Ambulatory Visit: Payer: Self-pay | Admitting: Internal Medicine

## 2013-07-23 ENCOUNTER — Ambulatory Visit: Payer: Medicare Other

## 2013-07-30 ENCOUNTER — Ambulatory Visit (INDEPENDENT_AMBULATORY_CARE_PROVIDER_SITE_OTHER): Payer: Medicare Other | Admitting: Pharmacist

## 2013-07-30 DIAGNOSIS — Z7901 Long term (current) use of anticoagulants: Secondary | ICD-10-CM

## 2013-07-30 DIAGNOSIS — Z9889 Other specified postprocedural states: Secondary | ICD-10-CM | POA: Diagnosis not present

## 2013-07-30 DIAGNOSIS — Z5181 Encounter for therapeutic drug level monitoring: Secondary | ICD-10-CM

## 2013-07-30 DIAGNOSIS — Z95828 Presence of other vascular implants and grafts: Secondary | ICD-10-CM

## 2013-07-30 DIAGNOSIS — I779 Disorder of arteries and arterioles, unspecified: Secondary | ICD-10-CM

## 2013-07-30 DIAGNOSIS — I743 Embolism and thrombosis of arteries of the lower extremities: Secondary | ICD-10-CM

## 2013-07-30 NOTE — Progress Notes (Signed)
Anti-Coagulation Progress Note  Kristin Knight is a 67 y.o. female who is currently on an anti-coagulation regimen.    RECENT RESULTS: Recent results are below, the most recent result is correlated with a dose of 28 mg. 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] Long term (current) use of anticoagulants [V58.61]        Comments         ANTICOAG TODAY: Anticoagulation Summary as of 07/30/2013   INR goal 2.0-3.0  Selected INR 2.40 (07/30/2013)  Next INR check 08/27/2013  Target end date    Indications  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  Patient instructed to take today's dose.  Patient verbalized understanding of these instructions.       FOLLOW-UP Return in 4 weeks (on 08/27/2013) for Follow up INR at 2:30PM.  Hulen Luster, III Pharm.D., CACP

## 2013-07-30 NOTE — Patient Instructions (Signed)
Patient instructed to take medications as defined in the Anti-coagulation Track section of this encounter.  Patient instructed to take today's dose.  Patient verbalized understanding of these instructions.    

## 2013-08-03 ENCOUNTER — Other Ambulatory Visit: Payer: Self-pay | Admitting: Internal Medicine

## 2013-08-06 NOTE — Telephone Encounter (Signed)
Medication refill-coumadin

## 2013-08-09 ENCOUNTER — Encounter: Payer: Self-pay | Admitting: Family

## 2013-08-10 ENCOUNTER — Ambulatory Visit (HOSPITAL_COMMUNITY)
Admission: RE | Admit: 2013-08-10 | Discharge: 2013-08-10 | Disposition: A | Discharge status: Home / Self Care | Payer: Medicare Other | Source: Ambulatory Visit | Attending: Family | Admitting: Family

## 2013-08-10 ENCOUNTER — Ambulatory Visit (INDEPENDENT_AMBULATORY_CARE_PROVIDER_SITE_OTHER)
Admission: RE | Admit: 2013-08-10 | Discharge: 2013-08-10 | Disposition: A | Discharge status: Home / Self Care | Payer: Medicare Other | Source: Ambulatory Visit | Attending: Neurosurgery | Admitting: Neurosurgery

## 2013-08-10 ENCOUNTER — Ambulatory Visit: Payer: Medicare Other | Admitting: Family

## 2013-08-10 DIAGNOSIS — Z48812 Encounter for surgical aftercare following surgery on the circulatory system: Secondary | ICD-10-CM | POA: Diagnosis not present

## 2013-08-10 DIAGNOSIS — I739 Peripheral vascular disease, unspecified: Secondary | ICD-10-CM

## 2013-08-10 NOTE — Progress Notes (Signed)
Patient ID: Kristin Knight, female   DOB: [birth year], 67 y.o.   MRN: [id number]       Pt. Had ABI and LE Art lab study.  There was no significant change, pt had no questions or problems.  Spoke to pt about Tobacco abuse, she has an appt. Aug 17, 2013 with her Doctor about smoking.        Kristin Knight, RMA, AMT

## 2013-08-15 ENCOUNTER — Other Ambulatory Visit: Payer: Self-pay | Admitting: *Deleted

## 2013-08-15 DIAGNOSIS — I739 Peripheral vascular disease, unspecified: Secondary | ICD-10-CM

## 2013-08-15 DIAGNOSIS — Z48812 Encounter for surgical aftercare following surgery on the circulatory system: Secondary | ICD-10-CM

## 2013-08-17 ENCOUNTER — Ambulatory Visit (INDEPENDENT_AMBULATORY_CARE_PROVIDER_SITE_OTHER): Payer: Medicare Other | Admitting: Internal Medicine

## 2013-08-17 VITALS — BP 105/74 | HR 77 | Temp 97.5°F | Wt 98.5 lb

## 2013-08-17 DIAGNOSIS — Z23 Encounter for immunization: Secondary | ICD-10-CM | POA: Diagnosis not present

## 2013-08-17 DIAGNOSIS — I779 Disorder of arteries and arterioles, unspecified: Secondary | ICD-10-CM

## 2013-08-17 DIAGNOSIS — F329 Major depressive disorder, single episode, unspecified: Secondary | ICD-10-CM

## 2013-08-17 DIAGNOSIS — M62838 Other muscle spasm: Secondary | ICD-10-CM | POA: Diagnosis not present

## 2013-08-17 DIAGNOSIS — I743 Embolism and thrombosis of arteries of the lower extremities: Secondary | ICD-10-CM | POA: Diagnosis not present

## 2013-08-17 DIAGNOSIS — F17200 Nicotine dependence, unspecified, uncomplicated: Secondary | ICD-10-CM | POA: Diagnosis not present

## 2013-08-17 DIAGNOSIS — Z5189 Encounter for other specified aftercare: Secondary | ICD-10-CM | POA: Diagnosis not present

## 2013-08-17 DIAGNOSIS — E785 Hyperlipidemia, unspecified: Secondary | ICD-10-CM | POA: Diagnosis not present

## 2013-08-17 DIAGNOSIS — D649 Anemia, unspecified: Secondary | ICD-10-CM

## 2013-08-17 DIAGNOSIS — I10 Essential (primary) hypertension: Secondary | ICD-10-CM

## 2013-08-17 DIAGNOSIS — Z720 Tobacco use: Secondary | ICD-10-CM

## 2013-08-17 DIAGNOSIS — T82868D Thrombosis of vascular prosthetic devices, implants and grafts, subsequent encounter: Secondary | ICD-10-CM

## 2013-08-17 LAB — LIPID PANEL
HDL: 65 mg/dL (ref >39)
LDL Cholesterol: 76 mg/dL (ref 0–99)
Total CHOL/HDL Ratio: 2.5 Ratio
VLDL: 22 mg/dL (ref 0–40)

## 2013-08-17 LAB — FERRITIN: Ferritin: 47 ng/mL (ref 10–291)

## 2013-08-17 MED ORDER — ZOSTER VACCINE LIVE 19400 UNT/0.65ML ~~LOC~~ SOLR: 0.6500 mL | Freq: Once | SUBCUTANEOUS | Status: DC

## 2013-08-17 MED ORDER — TRAMADOL HCL 50 MG PO TABS: 50.0000 mg | ORAL_TABLET | Freq: Four times a day (QID) | ORAL | Status: DC | PRN

## 2013-08-17 NOTE — Patient Instructions (Signed)
It was wonderful to see you again.  I think you may be suffering from a delayed grief reaction.  We will try to get you specialized help for this.  I believe this is playing a great role in your weight loss.   I started the tramadol for your pain and stopped the baclofen since it was not working.  Please take your other medications as you are.  Keep up the wonderful work you have been doing on stopping smoking!  I will see you back in 3 months sooner if necessary.

## 2013-08-18 ENCOUNTER — Encounter: Payer: Self-pay | Admitting: Internal Medicine
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
## 2013-08-18 NOTE — Assessment & Plan Note (Signed)
She received the flu vaccination today and was given a prescription for the Zoster vaccination.

## 2013-08-18 NOTE — Assessment & Plan Note (Signed)
She received no relief from the baclofen.  Examination reveals no spasm today in the neck/trapezius muscles.  We will stop the baclofen and start tramadol 50 mg PO Q6H PRN (on lifelong coumadin so will not try NSAIDs given risks).  We will reassess her symptoms on the tramadol at the next visit.

## 2013-08-18 NOTE — Progress Notes (Signed)
  Subjective:    Patient ID: Kristin Knight, female    DOB: 24-Oct-1946, 67 y.o.   MRN: [id number]  HPI  Please see the A&P for the status of the pt's chronic medical problems.  Review of Systems  Constitutional: Positive for activity change, appetite change, fatigue and unexpected weight change.  HENT: Positive for neck pain and neck stiffness.   Cardiovascular: Negative for leg swelling.  Musculoskeletal: Positive for myalgias.  Skin: Negative for rash and wound.  Psychiatric/Behavioral: Positive for dysphoric mood and decreased concentration.      Objective:   Physical Exam  Nursing note and vitals reviewed. Constitutional: She is oriented to person, place, and time. She appears well-developed. No distress.  HENT:  Head: Normocephalic and atraumatic.  Eyes: Conjunctivae are normal. Right eye exhibits no discharge. Left eye exhibits no discharge. No scleral icterus.  Neck: Normal range of motion. Neck supple.  Musculoskeletal: Normal range of motion. She exhibits no edema and no tenderness.  Neurological: She is alert and oriented to person, place, and time. She exhibits normal muscle tone.  Skin: Skin is warm and dry. She is not diaphoretic.  Psychiatric: Her mood appears not anxious. Her affect is not angry, not blunt, not labile and not inappropriate. Her speech is not rapid and/or pressured, not delayed, not tangential and not slurred. She is withdrawn. She is not agitated, not aggressive, not hyperactive, not slowed, not actively hallucinating and not combative. Thought content is not paranoid and not delusional. Cognition and memory are not impaired. She does not express impulsivity or inappropriate judgment. She exhibits a depressed mood. She is communicative. She exhibits normal recent memory and normal remote memory. She is attentive.      Assessment & Plan:   Please see problem based charting.

## 2013-08-18 NOTE — Assessment & Plan Note (Signed)
She has stopped smoking for the last 7 days and seems committed to remaining tobacco free.  She was praised on her efforts.  She is confident she can remain off of cigarettes as she is experiencing no withdrawal symptoms or cravings.  Will follow-up on her continued success at the follow-up visit.

## 2013-08-18 NOTE — Assessment & Plan Note (Signed)
She remains on life-long anticoagulation and is followed in the coumadin clinic by Dr. Alexandria Lodge.  We will continue this therapy.

## 2013-08-18 NOTE — Assessment & Plan Note (Signed)
Her blood pressure is at target, her LDL is also at target below 100, and she has quit smoking over the last week and seems committed to remain tobacco free.  We will continue her blood pressure medications, Pravachol at her current dose, and praise her for her tobacco cessation efforts.

## 2013-08-18 NOTE — Assessment & Plan Note (Signed)
Her blood pressure is at target at 105/74 on the lisinopril and triamterene-HCTZ.  We will continue this regimen.

## 2013-08-22 ENCOUNTER — Telehealth: Payer: Self-pay | Admitting: Licensed Clinical Social Worker

## 2013-08-22 NOTE — Telephone Encounter (Signed)
CSW was unable to see Kristin Knight during her scheduled [facility] appt, provided pt with information to hospice free counseling services.  Pt placed call to CSW to inquire about resources available.  CSW discussed options available and potential copayments.  Pt would like to see both therapy and psychiatry and is aware of potential copayment.  Pt has not contacted hospice and is in agreement with referral to [facility].  Kristin Knight is not linked with any other mental health agency.  Pt would prefer CSW to schedule appointment and has no preferences as to day or time.  CSW sent referral to [facility], awaiting appt.

## 2013-08-23 ENCOUNTER — Encounter: Payer: Self-pay | Admitting: Vascular Surgery

## 2013-08-24 NOTE — Telephone Encounter (Signed)
CSW placed call to Ms. Haan to inquire if pt had received call back from [facility].  Pt has not.  CSW placed call to [facility] and left message on main line [phone], left MRN and contact information.  CSW will continue to follow.

## 2013-08-27 ENCOUNTER — Ambulatory Visit (INDEPENDENT_AMBULATORY_CARE_PROVIDER_SITE_OTHER): Payer: Medicare Other | Admitting: Pharmacist

## 2013-08-27 DIAGNOSIS — Z7901 Long term (current) use of anticoagulants: Secondary | ICD-10-CM | POA: Diagnosis not present

## 2013-08-27 DIAGNOSIS — I779 Disorder of arteries and arterioles, unspecified: Secondary | ICD-10-CM

## 2013-08-27 DIAGNOSIS — Z95828 Presence of other vascular implants and grafts: Secondary | ICD-10-CM

## 2013-08-27 DIAGNOSIS — Z9889 Other specified postprocedural states: Secondary | ICD-10-CM | POA: Diagnosis not present

## 2013-08-27 DIAGNOSIS — I743 Embolism and thrombosis of arteries of the lower extremities: Secondary | ICD-10-CM | POA: Diagnosis not present

## 2013-08-27 DIAGNOSIS — Z5181 Encounter for therapeutic drug level monitoring: Secondary | ICD-10-CM

## 2013-08-27 MED ORDER — WARFARIN SODIUM 4 MG PO TABS: 4.0000 mg | ORAL_TABLET | Freq: Every day | ORAL | Status: DC

## 2013-08-27 NOTE — Progress Notes (Signed)
Indication: Left fem-pop thrombosis.  Duration: Lifelong.  INR: At target.  Agree with Dr. Saralyn Pilar assessment and plan.

## 2013-08-27 NOTE — Progress Notes (Signed)
Anti-Coagulation Progress Note  Kristin Knight is a 67 y.o. female who is currently on an anti-coagulation regimen.    RECENT RESULTS: Recent results are below, the most recent result is correlated with a dose of 28 mg. 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] Long term (current) use of anticoagulants [V58.61]        Comments         ANTICOAG TODAY: Anticoagulation Summary as of 08/27/2013   INR goal 2.0-3.0  Selected INR 2.60 (08/27/2013)  Next INR check 09/24/2013  Target end date    Indications  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  Patient instructed to take today's dose.  Patient verbalized understanding of these instructions.       FOLLOW-UP Return in 4 weeks (on 09/24/2013) for Follow up  INR at 2:30pm.  Hulen Luster, III Pharm.D., CACP

## 2013-08-27 NOTE — Telephone Encounter (Signed)
CSW received voice mail from [place] at [facility].  Aware of referral and Behavioral health to contact patient for appt.

## 2013-08-27 NOTE — Patient Instructions (Signed)
Patient instructed to take medications as defined in the Anti-coagulation Track section of this encounter.  Patient instructed to take today's dose.  Patient verbalized understanding of these instructions.    

## 2013-08-29 NOTE — Telephone Encounter (Signed)
CSW placed call to [name] to confirm [facility] appt had been scheduled. Pt is still awaiting Behavioral Health appt.

## 2013-09-04 ENCOUNTER — Encounter (HOSPITAL_COMMUNITY): Payer: Self-pay | Admitting: Psychology

## 2013-09-04 ENCOUNTER — Ambulatory Visit (INDEPENDENT_AMBULATORY_CARE_PROVIDER_SITE_OTHER): Payer: Medicare Other | Admitting: Psychology

## 2013-09-04 DIAGNOSIS — F331 Major depressive disorder, recurrent, moderate: Secondary | ICD-10-CM
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
## 2013-09-05 ENCOUNTER — Emergency Department (HOSPITAL_COMMUNITY)
Admission: EM | Admit: 2013-09-05 | Discharge: 2013-09-05 | Disposition: A | Discharge status: Home / Self Care | Payer: Medicare Other | Attending: Emergency Medicine | Admitting: Emergency Medicine

## 2013-09-05 ENCOUNTER — Encounter (HOSPITAL_COMMUNITY): Payer: Self-pay | Admitting: Emergency Medicine

## 2013-09-05 ENCOUNTER — Emergency Department (HOSPITAL_COMMUNITY): Payer: Medicare Other

## 2013-09-05 DIAGNOSIS — Z8739 Personal history of other diseases of the musculoskeletal system and connective tissue: Secondary | ICD-10-CM | POA: Diagnosis not present

## 2013-09-05 DIAGNOSIS — Z86718 Personal history of other venous thrombosis and embolism: Secondary | ICD-10-CM | POA: Insufficient documentation

## 2013-09-05 DIAGNOSIS — Z862 Personal history of diseases of the blood and blood-forming organs and certain disorders involving the immune mechanism: Secondary | ICD-10-CM | POA: Diagnosis not present

## 2013-09-05 DIAGNOSIS — J441 Chronic obstructive pulmonary disease with (acute) exacerbation: Secondary | ICD-10-CM | POA: Diagnosis not present

## 2013-09-05 DIAGNOSIS — Z8639 Personal history of other endocrine, nutritional and metabolic disease: Secondary | ICD-10-CM | POA: Insufficient documentation

## 2013-09-05 DIAGNOSIS — F411 Generalized anxiety disorder: Secondary | ICD-10-CM | POA: Insufficient documentation

## 2013-09-05 DIAGNOSIS — M129 Arthropathy, unspecified: Secondary | ICD-10-CM | POA: Insufficient documentation

## 2013-09-05 DIAGNOSIS — F3289 Other specified depressive episodes: Secondary | ICD-10-CM | POA: Insufficient documentation

## 2013-09-05 DIAGNOSIS — IMO0002 Reserved for concepts with insufficient information to code with codable children: Secondary | ICD-10-CM | POA: Insufficient documentation

## 2013-09-05 DIAGNOSIS — I10 Essential (primary) hypertension: Secondary | ICD-10-CM | POA: Insufficient documentation

## 2013-09-05 DIAGNOSIS — Z7901 Long term (current) use of anticoagulants: Secondary | ICD-10-CM | POA: Insufficient documentation

## 2013-09-05 DIAGNOSIS — Z87448 Personal history of other diseases of urinary system: Secondary | ICD-10-CM | POA: Diagnosis not present

## 2013-09-05 DIAGNOSIS — F329 Major depressive disorder, single episode, unspecified: Secondary | ICD-10-CM | POA: Diagnosis not present

## 2013-09-05 DIAGNOSIS — Z880 Allergy status to penicillin: Secondary | ICD-10-CM | POA: Insufficient documentation

## 2013-09-05 DIAGNOSIS — F17200 Nicotine dependence, unspecified, uncomplicated: Secondary | ICD-10-CM | POA: Diagnosis not present

## 2013-09-05 DIAGNOSIS — Z79899 Other long term (current) drug therapy: Secondary | ICD-10-CM | POA: Diagnosis not present

## 2013-09-05 DIAGNOSIS — J449 Chronic obstructive pulmonary disease, unspecified: Secondary | ICD-10-CM | POA: Diagnosis not present

## 2013-09-05 DIAGNOSIS — E785 Hyperlipidemia, unspecified: Secondary | ICD-10-CM | POA: Diagnosis not present

## 2013-09-05 DIAGNOSIS — Z8619 Personal history of other infectious and parasitic diseases: Secondary | ICD-10-CM | POA: Insufficient documentation

## 2013-09-05 DIAGNOSIS — R0602 Shortness of breath: Secondary | ICD-10-CM | POA: Diagnosis not present

## 2013-09-05 LAB — BASIC METABOLIC PANEL
BUN: 9 mg/dL (ref 6–23)
Calcium: 10 mg/dL (ref 8.4–10.5)
Creatinine, Ser: 0.8 mg/dL (ref 0.50–1.10)
GFR calc non Af Amer: 75 mL/min — ABNORMAL LOW (ref >90)
Glucose, Bld: 123 mg/dL — ABNORMAL HIGH (ref 70–99)
Potassium: 3.2 mEq/L — ABNORMAL LOW (ref 3.5–5.1)

## 2013-09-05 LAB — CBC
Hemoglobin: 13.7 g/dL (ref 12.0–15.0)
MCH: 29.5 pg (ref 26.0–34.0)
MCHC: 32.8 g/dL (ref 30.0–36.0)
RDW: 13.6 % (ref 11.5–15.5)

## 2013-09-05 LAB — POCT I-STAT TROPONIN I: Troponin i, poc: 0 ng/mL (ref 0.00–0.08)

## 2013-09-05 MED ORDER — IPRATROPIUM BROMIDE 0.02 % IN SOLN
0.5000 mg | Freq: Once | RESPIRATORY_TRACT | Status: AC
  Administered 2013-09-05: 0.5 mg via RESPIRATORY_TRACT

## 2013-09-05 MED ORDER — METHYLPREDNISOLONE SODIUM SUCC 125 MG IJ SOLR
125.0000 mg | Freq: Once | INTRAMUSCULAR | Status: AC
  Administered 2013-09-05: 125 mg via INTRAVENOUS
  Filled 2013-09-05: qty 2

## 2013-09-05 MED ORDER — DOXYCYCLINE HYCLATE 100 MG PO CAPS: 100.0000 mg | ORAL_CAPSULE | Freq: Two times a day (BID) | ORAL | Status: AC

## 2013-09-05 MED ORDER — SODIUM CHLORIDE 0.9 % IV BOLUS (SEPSIS)
500.0000 mL | Freq: Once | INTRAVENOUS | Status: AC
  Administered 2013-09-05: 500 mL via INTRAVENOUS

## 2013-09-05 MED ORDER — ALBUTEROL SULFATE HFA 108 (90 BASE) MCG/ACT IN AERS
6.0000 | INHALATION_SPRAY | Freq: Once | RESPIRATORY_TRACT | Status: AC
  Administered 2013-09-05: 6 via RESPIRATORY_TRACT
  Filled 2013-09-05: qty 6.7

## 2013-09-05 MED ORDER — PREDNISONE 50 MG PO TABS: ORAL_TABLET | ORAL | Status: DC

## 2013-09-05 MED ORDER — ALBUTEROL SULFATE HFA 108 (90 BASE) MCG/ACT IN AERS: 1.0000 | INHALATION_SPRAY | Freq: Four times a day (QID) | RESPIRATORY_TRACT | Status: DC | PRN

## 2013-09-05 MED ORDER — ALBUTEROL SULFATE (5 MG/ML) 0.5% IN NEBU
INHALATION_SOLUTION | RESPIRATORY_TRACT | Status: AC
  Filled 2013-09-05: qty 1

## 2013-09-05 MED ORDER — IPRATROPIUM BROMIDE 0.02 % IN SOLN
RESPIRATORY_TRACT | Status: AC
  Filled 2013-09-05: qty 2.5

## 2013-09-05 MED ORDER — ALBUTEROL SULFATE (5 MG/ML) 0.5% IN NEBU
5.0000 mg | INHALATION_SOLUTION | Freq: Once | RESPIRATORY_TRACT | Status: AC
  Administered 2013-09-05: 5 mg via RESPIRATORY_TRACT

## 2013-09-05 NOTE — Telephone Encounter (Signed)
EMR state pt has been scheduled for appt.  CSW will sign off.

## 2013-09-05 NOTE — ED Notes (Signed)
Presets with SOB that began a few days ago associated with cough, non productive and chest pain. Bilateral breath sounds diminished with expiratory wheezes. Sats on RA 95%, duoneb started at triage.
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
## 2013-09-17 ENCOUNTER — Other Ambulatory Visit: Payer: Self-pay | Admitting: Internal Medicine

## 2013-09-17 DIAGNOSIS — I10 Essential (primary) hypertension: Secondary | ICD-10-CM

## 2013-09-18 ENCOUNTER — Ambulatory Visit (HOSPITAL_COMMUNITY): Payer: Self-pay | Admitting: Psychology

## 2013-09-24 ENCOUNTER — Ambulatory Visit (INDEPENDENT_AMBULATORY_CARE_PROVIDER_SITE_OTHER): Payer: Medicare Other | Admitting: Pharmacist

## 2013-09-24 DIAGNOSIS — Z95828 Presence of other vascular implants and grafts: Secondary | ICD-10-CM

## 2013-09-24 DIAGNOSIS — Z5181 Encounter for therapeutic drug level monitoring: Secondary | ICD-10-CM

## 2013-09-24 DIAGNOSIS — Z7901 Long term (current) use of anticoagulants: Secondary | ICD-10-CM | POA: Diagnosis not present

## 2013-09-24 DIAGNOSIS — I743 Embolism and thrombosis of arteries of the lower extremities: Secondary | ICD-10-CM | POA: Diagnosis not present

## 2013-09-24 DIAGNOSIS — Z9889 Other specified postprocedural states: Secondary | ICD-10-CM | POA: Diagnosis not present

## 2013-09-24 DIAGNOSIS — I779 Disorder of arteries and arterioles, unspecified: Secondary | ICD-10-CM

## 2013-09-24 NOTE — Progress Notes (Signed)
Anti-Coagulation Progress Note  Kristin Knight is a 67 y.o. female who is currently on an anti-coagulation regimen.    RECENT RESULTS: Recent results are below, the most recent result is correlated with a dose of 28 mg. per week: Lab Results  Component Value Date   INR 1.80 09/24/2013   INR 2.60 08/27/2013   INR 2.40 07/30/2013    ANTI-COAG DOSE: Anticoagulation Dose Instructions as of 09/24/2013     [name] Tue Wed Thu Fri Sat   New Dose 4 mg 6 mg 4 mg 4 mg 4 mg 4 mg 4 mg       ANTICOAG SUMMARY: Anticoagulation Episode Summary   Current INR goal 2.0-3.0  Next INR check 10/15/2013  INR from last check 1.80! (09/24/2013)  Weekly max dose   Target end date   [facility] check [facility]  Preferred lab   Send INR reminders to    Indications  Peripheral arterial occlusive disease [444.22] Long term (current) use of anticoagulants [V58.61]        Comments         ANTICOAG TODAY: Anticoagulation Summary as of 09/24/2013   INR goal 2.0-3.0  Selected INR 1.80! (09/24/2013)  Next INR check 10/15/2013  Target end date    Indications  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  Patient instructed to take today's dose.  Patient verbalized understanding of these instructions.       FOLLOW-UP Return in 3 weeks (on 10/15/2013) for Follow up INR at 2:15PM.  Hulen Luster, III Pharm.D., CACP

## 2013-09-24 NOTE — Patient Instructions (Signed)
Patient instructed to take medications as defined in the Anti-coagulation Track section of this encounter.  Patient instructed to take today's dose.  Patient verbalized understanding of these instructions.    

## 2013-09-25 NOTE — Progress Notes (Signed)
Indication: Left fem-pop graft thrombosis. Duration: Lifelong. INR: Below target. Agree with Dr. Groce's assessment and plan. 

## 2013-09-26 ENCOUNTER — Ambulatory Visit (INDEPENDENT_AMBULATORY_CARE_PROVIDER_SITE_OTHER): Payer: Medicare Other | Admitting: Psychology

## 2013-09-26 DIAGNOSIS — F331 Major depressive disorder, recurrent, moderate: Secondary | ICD-10-CM | POA: Diagnosis not present

## 2013-09-26 NOTE — Progress Notes (Signed)
   THERAPIST PROGRESS NOTE  Session Time: 1.25pm-2:12pm  Participation Level: Active  Behavioral Response: Well GroomedAlertDepressed  Type of Therapy: Individual Therapy  Treatment Goals addressed: Diagnosis: MDD  Interventions: CBT and Supportive  Summary: Kristin Knight is a 67 y.o. female who presents with report of continued weight loss- now 98lbs, loss of appetite, and depressed mood.  Pt reports she has been busy as sister had a stroke 2 weeks ago and cousin died 2013-10-08. Pt reports that she will be moving in w/ her sister w/in the next 2 weeks.  Pt reports looking forward to for company.  Pt reports she did eat dinner last night that she had made.  Pt reports haven't followed up on nutrition counseling as suggested.  Pt discussed loss of interest since death of her husband- but acknowledges need to connect w/ others.  Pt expressed feeling guilty if doing things as if leaving behind husband.  Pt receptive to approach that she is instead honoring his life and their life together.  Pt reported she wants to commit to sister to go visit niece in [place] for baby shower.  Pt discussed other things to do w/ family members as well.  Suicidal/Homicidal: Nowithout intent/plan  Therapist Response: Assessed pt current functioning per pt report.  Explored w/pt f/u on nutritional counseling.  Processed w/pt loss of interest and how relates to her bereavement.  Assisted pt in expressing emotions and assisted in reframing .  Discussed potential activities and outings and how can use to honor husband and marriage they had.  Plan: Return again in 2-3 weeks.  Diagnosis: Axis I: MDD    Axis II: No diagnosis    YATES,LEANNE, LPC 09/26/2013

## 2013-09-27 ENCOUNTER — Encounter (HOSPITAL_COMMUNITY): Payer: Self-pay | Admitting: Emergency Medicine

## 2013-09-27 ENCOUNTER — Inpatient Hospital Stay (HOSPITAL_COMMUNITY)
Admission: EM | Admit: 2013-09-27 | Discharge: 2013-10-01 | DRG: 252 | Disposition: A | Discharge status: Home / Self Care | Payer: Medicare Other | Attending: Vascular Surgery | Admitting: Vascular Surgery

## 2013-09-27 DIAGNOSIS — I10 Essential (primary) hypertension: Secondary | ICD-10-CM | POA: Diagnosis not present

## 2013-09-27 DIAGNOSIS — F329 Major depressive disorder, single episode, unspecified: Secondary | ICD-10-CM | POA: Diagnosis present

## 2013-09-27 DIAGNOSIS — Z681 Body mass index (BMI) 19 or less, adult: Secondary | ICD-10-CM | POA: Diagnosis not present

## 2013-09-27 DIAGNOSIS — Z79899 Other long term (current) drug therapy: Secondary | ICD-10-CM

## 2013-09-27 DIAGNOSIS — I779 Disorder of arteries and arterioles, unspecified: Secondary | ICD-10-CM

## 2013-09-27 DIAGNOSIS — E785 Hyperlipidemia, unspecified: Secondary | ICD-10-CM | POA: Diagnosis present

## 2013-09-27 DIAGNOSIS — I743 Embolism and thrombosis of arteries of the lower extremities: Secondary | ICD-10-CM | POA: Diagnosis present

## 2013-09-27 DIAGNOSIS — Y831 Surgical operation with implant of artificial internal device as the cause of abnormal reaction of the patient, or of later complication, without mention of misadventure at the time of the procedure: Secondary | ICD-10-CM | POA: Diagnosis present

## 2013-09-27 DIAGNOSIS — F3289 Other specified depressive episodes: Secondary | ICD-10-CM | POA: Diagnosis present

## 2013-09-27 DIAGNOSIS — Z7901 Long term (current) use of anticoagulants: Secondary | ICD-10-CM | POA: Diagnosis not present

## 2013-09-27 DIAGNOSIS — E43 Unspecified severe protein-calorie malnutrition: Secondary | ICD-10-CM | POA: Diagnosis present

## 2013-09-27 DIAGNOSIS — T82898A Other specified complication of vascular prosthetic devices, implants and grafts, initial encounter: Principal | ICD-10-CM | POA: Diagnosis present

## 2013-09-27 DIAGNOSIS — I739 Peripheral vascular disease, unspecified: Secondary | ICD-10-CM | POA: Diagnosis present

## 2013-09-27 DIAGNOSIS — F17200 Nicotine dependence, unspecified, uncomplicated: Secondary | ICD-10-CM | POA: Diagnosis present

## 2013-09-27 DIAGNOSIS — M549 Dorsalgia, unspecified: Secondary | ICD-10-CM | POA: Diagnosis not present

## 2013-09-27 DIAGNOSIS — M62838 Other muscle spasm: Secondary | ICD-10-CM

## 2013-09-27 DIAGNOSIS — Z48812 Encounter for surgical aftercare following surgery on the circulatory system: Secondary | ICD-10-CM | POA: Diagnosis not present

## 2013-09-27 DIAGNOSIS — F411 Generalized anxiety disorder: Secondary | ICD-10-CM | POA: Diagnosis present

## 2013-09-27 DIAGNOSIS — I7092 Chronic total occlusion of artery of the extremities: Secondary | ICD-10-CM | POA: Diagnosis not present

## 2013-09-27 DIAGNOSIS — E119 Type 2 diabetes mellitus without complications: Secondary | ICD-10-CM | POA: Diagnosis present

## 2013-09-27 DIAGNOSIS — M79609 Pain in unspecified limb: Secondary | ICD-10-CM | POA: Diagnosis not present

## 2013-09-27 DIAGNOSIS — E441 Mild protein-calorie malnutrition: Secondary | ICD-10-CM | POA: Insufficient documentation

## 2013-09-27 DIAGNOSIS — D709 Neutropenia, unspecified: Secondary | ICD-10-CM | POA: Diagnosis not present

## 2013-09-27 LAB — BASIC METABOLIC PANEL
CO2: 32 mEq/L (ref 19–32)
Chloride: 100 mEq/L (ref 96–112)
Creatinine, Ser: 0.88 mg/dL (ref 0.50–1.10)
GFR calc Af Amer: 78 mL/min — ABNORMAL LOW (ref >90)
GFR calc non Af Amer: 67 mL/min — ABNORMAL LOW (ref >90)
Potassium: 3.8 mEq/L (ref 3.5–5.1)
Sodium: 139 mEq/L (ref 135–145)

## 2013-09-27 LAB — CBC WITH DIFFERENTIAL/PLATELET
Basophils Absolute: 0 10*3/uL (ref 0.0–0.1)
Eosinophils Relative: 5 % (ref 0–5)
Hemoglobin: 12.7 g/dL (ref 12.0–15.0)
Monocytes Absolute: 0.4 10*3/uL (ref 0.1–1.0)
Monocytes Relative: 12 % (ref 3–12)
Neutrophils Relative %: 28 % — ABNORMAL LOW (ref 43–77)
Platelets: 198 10*3/uL (ref 150–400)
RBC: 4.29 MIL/uL (ref 3.87–5.11)
WBC: 3.2 10*3/uL — ABNORMAL LOW (ref 4.0–10.5)

## 2013-09-27 LAB — PROTIME-INR
INR: 1.55 — ABNORMAL HIGH (ref 0.00–1.49)
Prothrombin Time: 18.2 seconds — ABNORMAL HIGH (ref 11.6–15.2)

## 2013-09-27 NOTE — ED Notes (Signed)
Patient with leg pain on the left.  She states that she felt weak in them today, but the more she walked on her left, the increase of the pain.  Patient states she does have history of blood clots.  Patient has noticed some swelling in both legs.

## 2013-09-27 NOTE — ED Notes (Signed)
CSMT's and pulses intact in left foot.

## 2013-09-28 ENCOUNTER — Encounter (HOSPITAL_COMMUNITY): Payer: Medicare Other | Admitting: Anesthesiology

## 2013-09-28 ENCOUNTER — Emergency Department (HOSPITAL_COMMUNITY): Payer: Medicare Other

## 2013-09-28 ENCOUNTER — Encounter: Payer: Self-pay | Admitting: Internal Medicine

## 2013-09-28 ENCOUNTER — Encounter (HOSPITAL_COMMUNITY): Payer: Self-pay | Admitting: Anesthesiology

## 2013-09-28 ENCOUNTER — Emergency Department (HOSPITAL_COMMUNITY): Payer: Medicare Other | Admitting: Anesthesiology

## 2013-09-28 ENCOUNTER — Encounter (HOSPITAL_COMMUNITY)
Admission: EM | Disposition: A | Discharge status: Home / Self Care | Payer: Self-pay | Source: Home / Self Care | Attending: Vascular Surgery

## 2013-09-28 DIAGNOSIS — T82898A Other specified complication of vascular prosthetic devices, implants and grafts, initial encounter: Secondary | ICD-10-CM | POA: Diagnosis not present

## 2013-09-28 DIAGNOSIS — M549 Dorsalgia, unspecified: Secondary | ICD-10-CM | POA: Diagnosis not present

## 2013-09-28 DIAGNOSIS — I7092 Chronic total occlusion of artery of the extremities: Secondary | ICD-10-CM | POA: Diagnosis not present

## 2013-09-28 DIAGNOSIS — I743 Embolism and thrombosis of arteries of the lower extremities: Secondary | ICD-10-CM | POA: Diagnosis not present

## 2013-09-28 DIAGNOSIS — I10 Essential (primary) hypertension: Secondary | ICD-10-CM | POA: Diagnosis not present

## 2013-09-28 DIAGNOSIS — Z48812 Encounter for surgical aftercare following surgery on the circulatory system: Secondary | ICD-10-CM

## 2013-09-28 DIAGNOSIS — Z681 Body mass index (BMI) 19 or less, adult: Secondary | ICD-10-CM | POA: Diagnosis not present

## 2013-09-28 DIAGNOSIS — E43 Unspecified severe protein-calorie malnutrition: Secondary | ICD-10-CM | POA: Diagnosis not present

## 2013-09-28 HISTORY — PX: FEMORAL-POPLITEAL BYPASS GRAFT: SHX937

## 2013-09-28 HISTORY — PX: INTRAOPERATIVE ARTERIOGRAM: SHX5157

## 2013-09-28 LAB — GLUCOSE, CAPILLARY: Glucose-Capillary: 125 mg/dL — ABNORMAL HIGH (ref 70–99)

## 2013-09-28 LAB — SURGICAL PCR SCREEN: Staphylococcus aureus: NEGATIVE

## 2013-09-28 LAB — PATHOLOGIST SMEAR REVIEW

## 2013-09-28 SURGERY — BYPASS GRAFT FEMORAL-POPLITEAL ARTERY
Anesthesia: General | Site: Leg Upper | Laterality: Left | Wound class: Clean

## 2013-09-28 MED ORDER — MIDAZOLAM HCL 5 MG/5ML IJ SOLN
INTRAMUSCULAR | Status: DC | PRN
  Administered 2013-09-28: 1 mg via INTRAVENOUS

## 2013-09-28 MED ORDER — ACETAMINOPHEN 650 MG RE SUPP: 325.0000 mg | RECTAL | Status: DC | PRN

## 2013-09-28 MED ORDER — ENSURE COMPLETE PO LIQD
237.0000 mL | Freq: Two times a day (BID) | ORAL | Status: DC
  Administered 2013-09-28 – 2013-10-01 (×6): 237 mL via ORAL

## 2013-09-28 MED ORDER — 0.9 % SODIUM CHLORIDE (POUR BTL) OPTIME
TOPICAL | Status: DC | PRN
  Administered 2013-09-28: 3000 mL

## 2013-09-28 MED ORDER — MAGNESIUM SULFATE 40 MG/ML IJ SOLN
2.0000 g | Freq: Once | INTRAMUSCULAR | Status: AC | PRN
  Filled 2013-09-28: qty 50

## 2013-09-28 MED ORDER — ARTIFICIAL TEARS OP OINT
TOPICAL_OINTMENT | OPHTHALMIC | Status: DC | PRN
  Administered 2013-09-28: 1 via OPHTHALMIC

## 2013-09-28 MED ORDER — PROTAMINE SULFATE 10 MG/ML IV SOLN
INTRAVENOUS | Status: DC | PRN
  Administered 2013-09-28: 25 mg via INTRAVENOUS

## 2013-09-28 MED ORDER — ONDANSETRON HCL 4 MG/2ML IJ SOLN: 4.0000 mg | Freq: Four times a day (QID) | INTRAMUSCULAR | Status: DC | PRN

## 2013-09-28 MED ORDER — THROMBIN 20000 UNITS EX SOLR
CUTANEOUS | Status: AC
  Filled 2013-09-28: qty 20000

## 2013-09-28 MED ORDER — HYDROMORPHONE HCL PF 1 MG/ML IJ SOLN
INTRAMUSCULAR | Status: AC
  Filled 2013-09-28: qty 1

## 2013-09-28 MED ORDER — MORPHINE SULFATE 2 MG/ML IJ SOLN
2.0000 mg | INTRAMUSCULAR | Status: DC | PRN
  Administered 2013-09-28 (×2): 2 mg via INTRAVENOUS
  Administered 2013-09-28: 4 mg via INTRAVENOUS
  Administered 2013-09-28: 2 mg via INTRAVENOUS
  Administered 2013-09-29: 4 mg via INTRAVENOUS
  Filled 2013-09-28: qty 2
  Filled 2013-09-28: qty 1
  Filled 2013-09-28: qty 2
  Filled 2013-09-28 (×2): qty 1

## 2013-09-28 MED ORDER — PHENOL 1.4 % MT LIQD: 1.0000 | OROMUCOSAL | Status: DC | PRN

## 2013-09-28 MED ORDER — HYDROMORPHONE HCL PF 1 MG/ML IJ SOLN
0.2500 mg | INTRAMUSCULAR | Status: DC | PRN
  Administered 2013-09-28 (×4): 0.5 mg via INTRAVENOUS

## 2013-09-28 MED ORDER — SODIUM CHLORIDE 0.9 % IR SOLN
Status: DC | PRN
  Administered 2013-09-28: 02:00:00

## 2013-09-28 MED ORDER — WARFARIN - PHYSICIAN DOSING INPATIENT
Freq: Every day | Status: DC
  Administered 2013-09-28: 18:00:00

## 2013-09-28 MED ORDER — HYDRALAZINE HCL 20 MG/ML IJ SOLN: 10.0000 mg | INTRAMUSCULAR | Status: DC | PRN

## 2013-09-28 MED ORDER — HEPARIN SODIUM (PORCINE) 1000 UNIT/ML IJ SOLN
INTRAMUSCULAR | Status: DC | PRN
  Administered 2013-09-28: 5000 [IU] via INTRAVENOUS

## 2013-09-28 MED ORDER — LABETALOL HCL 5 MG/ML IV SOLN
10.0000 mg | INTRAVENOUS | Status: DC | PRN
  Filled 2013-09-28: qty 4

## 2013-09-28 MED ORDER — DOPAMINE-DEXTROSE 3.2-5 MG/ML-% IV SOLN: 3.0000 ug/kg/min | INTRAVENOUS | Status: DC

## 2013-09-28 MED ORDER — LIDOCAINE HCL (CARDIAC) 20 MG/ML IV SOLN
INTRAVENOUS | Status: DC | PRN
  Administered 2013-09-28: 60 mg via INTRAVENOUS

## 2013-09-28 MED ORDER — TRAMADOL HCL 50 MG PO TABS
50.0000 mg | ORAL_TABLET | Freq: Four times a day (QID) | ORAL | Status: DC | PRN
  Administered 2013-09-29 – 2013-10-01 (×5): 50 mg via ORAL
  Filled 2013-09-28 (×5): qty 1

## 2013-09-28 MED ORDER — ONDANSETRON HCL 4 MG/2ML IJ SOLN: 4.0000 mg | Freq: Once | INTRAMUSCULAR | Status: DC | PRN

## 2013-09-28 MED ORDER — IPRATROPIUM-ALBUTEROL 20-100 MCG/ACT IN AERS: 1.0000 | INHALATION_SPRAY | Freq: Four times a day (QID) | RESPIRATORY_TRACT | Status: DC | PRN

## 2013-09-28 MED ORDER — TRIAMTERENE-HCTZ 37.5-25 MG PO TABS
1.0000 | ORAL_TABLET | Freq: Every day | ORAL | Status: DC
  Administered 2013-09-28 – 2013-10-01 (×4): 1 via ORAL
  Filled 2013-09-28 (×4): qty 1

## 2013-09-28 MED ORDER — FLUOXETINE HCL 20 MG PO CAPS
20.0000 mg | ORAL_CAPSULE | Freq: Every day | ORAL | Status: DC
  Administered 2013-09-28 – 2013-10-01 (×4): 20 mg via ORAL
  Filled 2013-09-28 (×4): qty 1

## 2013-09-28 MED ORDER — GUAIFENESIN-DM 100-10 MG/5ML PO SYRP: 15.0000 mL | ORAL_SOLUTION | ORAL | Status: DC | PRN

## 2013-09-28 MED ORDER — WARFARIN SODIUM 4 MG PO TABS
4.0000 mg | ORAL_TABLET | Freq: Every day | ORAL | Status: DC
  Administered 2013-09-28 – 2013-09-30 (×3): 4 mg via ORAL
  Filled 2013-09-28 (×4): qty 1

## 2013-09-28 MED ORDER — PROPOFOL 10 MG/ML IV BOLUS
INTRAVENOUS | Status: DC | PRN
  Administered 2013-09-28: 125 mg via INTRAVENOUS

## 2013-09-28 MED ORDER — POTASSIUM CHLORIDE CRYS ER 20 MEQ PO TBCR: 20.0000 meq | EXTENDED_RELEASE_TABLET | Freq: Once | ORAL | Status: AC | PRN

## 2013-09-28 MED ORDER — ACETAMINOPHEN 325 MG PO TABS
325.0000 mg | ORAL_TABLET | ORAL | Status: DC | PRN
  Administered 2013-09-28 – 2013-09-30 (×4): 650 mg via ORAL
  Filled 2013-09-28 (×4): qty 2

## 2013-09-28 MED ORDER — LISINOPRIL 20 MG PO TABS
20.0000 mg | ORAL_TABLET | Freq: Every day | ORAL | Status: DC
  Administered 2013-09-28 – 2013-10-01 (×3): 20 mg via ORAL
  Filled 2013-09-28 (×4): qty 1

## 2013-09-28 MED ORDER — ALUM & MAG HYDROXIDE-SIMETH 200-200-20 MG/5ML PO SUSP: 15.0000 mL | ORAL | Status: DC | PRN

## 2013-09-28 MED ORDER — SODIUM CHLORIDE 0.9 % IV SOLN
INTRAVENOUS | Status: DC
  Administered 2013-09-28: 08:00:00 via INTRAVENOUS

## 2013-09-28 MED ORDER — LACTATED RINGERS IV SOLN
INTRAVENOUS | Status: DC | PRN
  Administered 2013-09-28: 01:00:00 via INTRAVENOUS

## 2013-09-28 MED ORDER — FENTANYL CITRATE 0.05 MG/ML IJ SOLN
INTRAMUSCULAR | Status: DC | PRN
  Administered 2013-09-28: 50 ug via INTRAVENOUS
  Administered 2013-09-28: 75 ug via INTRAVENOUS
  Administered 2013-09-28 (×2): 50 ug via INTRAVENOUS
  Administered 2013-09-28: 25 ug via INTRAVENOUS

## 2013-09-28 MED ORDER — LIDOCAINE HCL 4 % MT SOLN
OROMUCOSAL | Status: DC | PRN
  Administered 2013-09-28: 4 mL via TOPICAL

## 2013-09-28 MED ORDER — ONDANSETRON HCL 4 MG/2ML IJ SOLN
INTRAMUSCULAR | Status: DC | PRN
  Administered 2013-09-28: 4 mg via INTRAVENOUS

## 2013-09-28 MED ORDER — SODIUM CHLORIDE 0.9 % IV SOLN: 500.0000 mL | Freq: Once | INTRAVENOUS | Status: AC | PRN

## 2013-09-28 MED ORDER — SODIUM CHLORIDE 0.9 % IV SOLN
10.0000 mg | INTRAVENOUS | Status: DC | PRN
  Administered 2013-09-28: 50 ug/min via INTRAVENOUS

## 2013-09-28 MED ORDER — PANTOPRAZOLE SODIUM 40 MG PO TBEC
40.0000 mg | DELAYED_RELEASE_TABLET | Freq: Every day | ORAL | Status: DC
  Administered 2013-09-28 – 2013-10-01 (×4): 40 mg via ORAL
  Filled 2013-09-28 (×4): qty 1

## 2013-09-28 MED ORDER — SIMVASTATIN 10 MG PO TABS
10.0000 mg | ORAL_TABLET | Freq: Every day | ORAL | Status: DC
  Administered 2013-09-28 – 2013-09-30 (×3): 10 mg via ORAL
  Filled 2013-09-28 (×4): qty 1

## 2013-09-28 MED ORDER — CEFAZOLIN SODIUM 1-5 GM-% IV SOLN
1.0000 g | INTRAVENOUS | Status: AC
  Administered 2013-09-28: 1 g via INTRAVENOUS
  Filled 2013-09-28: qty 50

## 2013-09-28 MED ORDER — THROMBIN 20000 UNITS EX SOLR
CUTANEOUS | Status: DC | PRN
  Administered 2013-09-28: 03:00:00 via TOPICAL

## 2013-09-28 MED ORDER — IOHEXOL 300 MG/ML  SOLN
INTRAMUSCULAR | Status: DC | PRN
  Administered 2013-09-28: 23 mL via INTRAVENOUS

## 2013-09-28 MED ORDER — DOCUSATE SODIUM 100 MG PO CAPS
100.0000 mg | ORAL_CAPSULE | Freq: Every day | ORAL | Status: DC
  Administered 2013-09-29 – 2013-10-01 (×3): 100 mg via ORAL
  Filled 2013-09-28 (×4): qty 1

## 2013-09-28 MED ORDER — METOPROLOL TARTRATE 1 MG/ML IV SOLN: 2.0000 mg | INTRAVENOUS | Status: DC | PRN

## 2013-09-28 MED ORDER — SUCCINYLCHOLINE CHLORIDE 20 MG/ML IJ SOLN
INTRAMUSCULAR | Status: DC | PRN
  Administered 2013-09-28: 80 mg via INTRAVENOUS

## 2013-09-28 MED ORDER — ENOXAPARIN SODIUM 60 MG/0.6ML ~~LOC~~ SOLN
45.0000 mg | Freq: Two times a day (BID) | SUBCUTANEOUS | Status: DC
  Administered 2013-09-28 – 2013-09-29 (×4): 45 mg via SUBCUTANEOUS
  Administered 2013-09-30: 11:00:00 via SUBCUTANEOUS
  Administered 2013-09-30 – 2013-10-01 (×2): 45 mg via SUBCUTANEOUS
  Filled 2013-09-28 (×9): qty 0.6

## 2013-09-28 SURGICAL SUPPLY — 65 items
BANDAGE ELASTIC 4 VELCRO ST LF (GAUZE/BANDAGES/DRESSINGS) IMPLANT
BANDAGE ESMARK 6X9 LF (GAUZE/BANDAGES/DRESSINGS) IMPLANT
BNDG ESMARK 6X9 LF (GAUZE/BANDAGES/DRESSINGS)
CANISTER SUCTION 2500CC (MISCELLANEOUS) ×2 IMPLANT
CANNULA VESSEL 3MM 2 BLNT TIP (CANNULA) ×2 IMPLANT
CATH EMB 4FR 80CM (CATHETERS) ×2 IMPLANT
CLIP TI MEDIUM 24 (CLIP) ×2 IMPLANT
CLIP TI WIDE RED SMALL 24 (CLIP) ×2 IMPLANT
COVER SURGICAL LIGHT HANDLE (MISCELLANEOUS) ×2 IMPLANT
CUFF TOURNIQUET SINGLE 24IN (TOURNIQUET CUFF) IMPLANT
CUFF TOURNIQUET SINGLE 34IN LL (TOURNIQUET CUFF) IMPLANT
CUFF TOURNIQUET SINGLE 44IN (TOURNIQUET CUFF) IMPLANT
DERMABOND ADVANCED (GAUZE/BANDAGES/DRESSINGS) ×2
DERMABOND ADVANCED .7 DNX12 (GAUZE/BANDAGES/DRESSINGS) ×2 IMPLANT
DRAIN CHANNEL 15F RND FF W/TCR (WOUND CARE) IMPLANT
DRAPE WARM FLUID 44X44 (DRAPE) ×2 IMPLANT
DRAPE X-RAY CASS 24X20 (DRAPES) ×2 IMPLANT
DRSG COVADERM 4X10 (GAUZE/BANDAGES/DRESSINGS) IMPLANT
DRSG COVADERM 4X8 (GAUZE/BANDAGES/DRESSINGS) IMPLANT
ELECT REM PT RETURN 9FT ADLT (ELECTROSURGICAL) ×2
ELECTRODE REM PT RTRN 9FT ADLT (ELECTROSURGICAL) ×1 IMPLANT
EVACUATOR SILICONE 100CC (DRAIN) IMPLANT
GLOVE BIO SURGEON STRL SZ7.5 (GLOVE) ×2 IMPLANT
GLOVE BIOGEL PI IND STRL 6.5 (GLOVE) ×2 IMPLANT
GLOVE BIOGEL PI IND STRL 7.5 (GLOVE) ×1 IMPLANT
GLOVE BIOGEL PI IND STRL 8 (GLOVE) ×1 IMPLANT
GLOVE BIOGEL PI INDICATOR 6.5 (GLOVE) ×2
GLOVE BIOGEL PI INDICATOR 7.5 (GLOVE) ×1
GLOVE BIOGEL PI INDICATOR 8 (GLOVE) ×1
GLOVE SS BIOGEL STRL SZ 6.5 (GLOVE) ×1 IMPLANT
GLOVE SS BIOGEL STRL SZ 7 (GLOVE) ×1 IMPLANT
GLOVE SUPERSENSE BIOGEL SZ 6.5 (GLOVE) ×1
GLOVE SUPERSENSE BIOGEL SZ 7 (GLOVE) ×1
GOWN STRL NON-REIN LRG LVL3 (GOWN DISPOSABLE) ×6 IMPLANT
GRAFT GORETEX 6X10 (Vascular Products) ×2 IMPLANT
KIT BASIN OR (CUSTOM PROCEDURE TRAY) ×2 IMPLANT
KIT ROOM TURNOVER OR (KITS) ×2 IMPLANT
MARKER GRAFT CORONARY BYPASS (MISCELLANEOUS) IMPLANT
NS IRRIG 1000ML POUR BTL (IV SOLUTION) ×6 IMPLANT
PACK PERIPHERAL VASCULAR (CUSTOM PROCEDURE TRAY) ×2 IMPLANT
PAD ARMBOARD 7.5X6 YLW CONV (MISCELLANEOUS) ×4 IMPLANT
PADDING CAST COTTON 6X4 STRL (CAST SUPPLIES) IMPLANT
SET COLLECT BLD 21X3/4 12 (NEEDLE) ×2 IMPLANT
SPONGE SURGIFOAM ABS GEL 100 (HEMOSTASIS) ×2 IMPLANT
STAPLER VISISTAT 35W (STAPLE) IMPLANT
STOPCOCK 4 WAY LG BORE MALE ST (IV SETS) ×2 IMPLANT
SUT ETHILON 3 0 PS 1 (SUTURE) IMPLANT
SUT PROLENE 5 0 C 1 24 (SUTURE) IMPLANT
SUT PROLENE 6 0 BV (SUTURE) ×4 IMPLANT
SUT PROLENE 7 0 BV 1 (SUTURE) IMPLANT
SUT SILK 2 0 FS (SUTURE) IMPLANT
SUT SILK 3 0 (SUTURE)
SUT SILK 3-0 18XBRD TIE 12 (SUTURE) IMPLANT
SUT VIC AB 2-0 CTB1 (SUTURE) ×4 IMPLANT
SUT VIC AB 3-0 SH 27 (SUTURE) ×2
SUT VIC AB 3-0 SH 27X BRD (SUTURE) ×2 IMPLANT
SUT VICRYL 4-0 PS2 18IN ABS (SUTURE) ×4 IMPLANT
SYR 3ML LL SCALE MARK (SYRINGE) ×2 IMPLANT
TOWEL OR 17X24 6PK STRL BLUE (TOWEL DISPOSABLE) ×6 IMPLANT
TOWEL OR 17X26 10 PK STRL BLUE (TOWEL DISPOSABLE) ×4 IMPLANT
TRAY FOLEY CATH 14FR (SET/KITS/TRAYS/PACK) ×2 IMPLANT
TRAY FOLEY CATH 16FRSI W/METER (SET/KITS/TRAYS/PACK) IMPLANT
TUBING EXTENTION W/L.L. (IV SETS) ×2 IMPLANT
UNDERPAD 30X30 INCONTINENT (UNDERPADS AND DIAPERS) ×2 IMPLANT
WATER STERILE IRR 1000ML POUR (IV SOLUTION) ×2 IMPLANT
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
## 2013-09-28 NOTE — Evaluation (Signed)
Physical Therapy Evaluation Patient Details Name: Kristin Knight MRN: [id number] DOB: Jun 27, 1946 Today's Date: 09/28/2013 Time: 7829-5621 PT Time Calculation (min): 20 min  PT Assessment / Plan / Recommendation History of Present Illness   She presented to the emergency department with acute onset Lt foot pain and was noted to have no Doppler flow in the left foot and vascular surgery was consult. Pt underwent thrombectomy of previous Lt fempop BPG on 11/14. Pt denies h/o falls, but reports in the past several months she has had significant losses of balance with catching herself on the wall or doorways.  Clinical Impression  Patient is s/p above surgery resulting in functional limitations due to the deficits listed below (see PT Problem List).  Patient will benefit from skilled PT to increase their independence and safety with mobility to allow discharge to the venue listed below.       PT Assessment  Patient needs continued PT services    Follow Up Recommendations  Home health PT;Supervision - Intermittent    Does the patient have the potential to tolerate intense rehabilitation      Barriers to Discharge Decreased caregiver support      Equipment Recommendations  Other (comment) (TBD-pt hopeful will not need DME)    Recommendations for Other Services OT consult   Frequency Min 3X/week    Precautions / Restrictions Precautions Precautions: Fall Precaution Comments: recent near falls   Pertinent Vitals/Pain LLE/groin; RN had pre-medicated for pain VSS on ICU monitor (walked on RA)       Mobility  Bed Mobility Bed Mobility: Supine to Sit;Sitting - Scoot to Edge of Bed Supine to Sit: 5: Supervision;HOB elevated Sitting - Scoot to [place] of Bed: 7: Independent Transfers Transfers: Sit to Stand;Stand to [employer] to Stand: 4: Min guard Stand to Sit: 4: Min guard Details for Transfer Assistance: slightly unsteady with favoring LLE Ambulation/Gait Ambulation/Gait  Assistance: 4: Min guard Ambulation Distance (Feet): 300 Feet Assistive device: None Ambulation/Gait Assistance Details: pt with slightly wide BOS and decr velocity due to LLE pain; pt with one staggering LOB to her right requiring min assist to recover. Gait Pattern: Step-through pattern;Decreased stride length;Wide base of support Stairs: No    Exercises     PT Diagnosis: Difficulty walking  PT Problem List: Decreased balance;Decreased knowledge of use of DME;Pain PT Treatment Interventions: DME instruction;Gait training;Stair training;Functional mobility training;Therapeutic activities;Therapeutic exercise;Balance training;Patient/family education     PT Goals(Current goals can be found in the care plan section) Acute Rehab PT Goals Patient Stated Goal: doesn't want to fall and doesn't want to use walker or cane (if possible) PT Goal Formulation: With patient Time For Goal Achievement: 10/02/13 Potential to Achieve Goals: Good  Visit Information  Last PT Received On: 09/28/13 Assistance Needed: +1 History of Present Illness:  She presented to the emergency department with acute onset Lt foot pain and was noted to have no Doppler flow in the left foot and vascular surgery was consult. Pt underwent thrombectomy of previous Lt fempop BPG on 11/14. Pt denies h/o falls, but reports in the past several months she has had significant losses of balance with catching herself on the wall or doorways.       Prior Functioning  Home Living Family/patient expects to be discharged to:: Private residence Living Arrangements: Alone Available Help at Discharge: Family;Friend(s);Available PRN/intermittently Type of Home: House Home Access: Stairs to enter [employer] of Steps: 5 Entrance Stairs-Rails: Right;Left Home Layout: One level Home Equipment: Crutches Additional Comments: typically  very active; drives Prior Function Level of Independence: Independent Comments: with recent  decr balance; denies dizziness, changes in hearing, changes in medication Communication Communication: No difficulties Dominant Hand: Left    Cognition  Cognition Arousal/Alertness: Awake/alert Behavior During Therapy: WFL for tasks assessed/performed Overall Cognitive Status: Within Functional Limits for tasks assessed    Extremity/Trunk Assessment Upper Extremity Assessment Upper Extremity Assessment: Overall WFL for tasks assessed Lower Extremity Assessment Lower Extremity Assessment: Overall WFL for tasks assessed (full AROM although painful LLE s/p thrombectomy) Cervical / Trunk Assessment Cervical / Trunk Assessment: Normal   Balance Balance Balance Assessed: Yes Static Standing Balance Static Standing - Balance Support: No upper extremity supported Static Standing - Level of Assistance: 7: Independent Single Leg Stance - Right Leg: 2 Single Leg Stance - Left Leg: 5 Tandem Stance - Right Leg: 20 Tandem Stance - Left Leg: 20 Rhomberg - Eyes Opened: 30 Rhomberg - Eyes Closed: 30 Standardized Balance Assessment Standardized Balance Assessment: Berg Balance Test Berg Balance Test Sit to Stand: Able to stand without using hands and stabilize independently Standing Unsupported: Able to stand safely 2 minutes Sitting with Back Unsupported but Feet Supported on Floor or Stool: Able to sit safely and securely 2 minutes Stand to Sit: Controls descent by using hands Transfers: Able to transfer safely, minor use of hands Standing Unsupported with Eyes Closed: Able to stand 10 seconds safely Standing Ubsupported with Feet Together: Able to place feet together independently and stand for 1 minute with supervision From Standing, Reach Forward with Outstretched Arm: Can reach confidently >25 cm (10") Standing Unsupported, One Foot in Front: Able to plae foot ahead of the other independently and hold 30 seconds Standing on One Leg: Able to lift leg independently and hold equal to or more  than 3 seconds  End of Session PT - End of Session Activity Tolerance: Patient tolerated treatment well Patient left: in chair;with call bell/phone within reach Nurse Communication: Mobility status  GP     Jaymison Luber 09/28/2013, 10:39 AM Pager [phone]

## 2013-09-28 NOTE — Evaluation (Signed)
Occupational Therapy Evaluation Patient Details Name: Kristin Knight MRN: [id number] DOB: 1946-11-10 Today's Date: 09/28/2013 Time: 4098-1191 OT Time Calculation (min): 21 min  OT Assessment / Plan / Recommendation History of present illness  She presented to the emergency department with acute onset Lt foot pain and was noted to have no Doppler flow in the left foot and vascular surgery was consult. Pt underwent thrombectomy of previous Lt fempop BPG on 11/14. Pt denies h/o falls, but reports in the past several months she has had significant losses of balance with catching herself on the wall or doorways.   Clinical Impression   Patient evaluated by Occupational Therapy with no further acute OT needs identified. All education has been completed and the patient has no further questions. Pt is able to perform BADLs with supervision.  Anticipate she will progress to modified independence quickly.  Noted that she reported near falls with PT, she denies falls, or near falls with OT.  See below for any follow-up Occupational Therapy or equipment needs. OT is signing off. Thank you for this referral.     OT Assessment  Patient does not need any further OT services    Follow Up Recommendations  No OT follow up;Supervision - Intermittent    Barriers to Discharge      Equipment Recommendations  None recommended by OT    Recommendations for Other Services    Frequency       Precautions / Restrictions Precautions Precautions: Fall Precaution Comments: recent near falls   Pertinent Vitals/Pain     ADL  Eating/Feeding: Independent Where Assessed - Eating/Feeding: Bed level Grooming: Wash/dry hands;Wash/dry face;Teeth care;Supervision/safety Where Assessed - Grooming: Unsupported standing Upper Body Bathing: Set up Where Assessed - Upper Body Bathing: Supported sitting;Unsupported sitting Lower Body Bathing: Supervision/safety Where Assessed - Lower Body Bathing: Unsupported sit to  stand;Supported sit to Programmer, applications Dressing: Set up Where Assessed - Upper Body Dressing: Unsupported sitting;Supported sitting Lower Body Dressing: Supervision/safety Where Assessed - Lower Body Dressing: Unsupported sit to stand;Supported sit to Pharmacist, hospital: Radiographer, therapeutic Method: Sit to stand;Stand Wellsite geologist: Comfort height toilet;Regular height toilet Toileting - Architect and Hygiene: Supervision/safety Where Assessed - Engineer, mining and Hygiene: Standing Tub/Shower Transfer: Supervision/safety;Simulated Web designer Method: Ambulating Transfers/Ambulation Related to ADLs: supervision ADL Comments: Pt able to access feet without difficulty.  Pt performed simulated tub transfer with supervision.  Pt instructed to ambulate later in day with nursing and to begin performing BADLs with set up    OT Diagnosis:    OT Problem List:   OT Treatment Interventions:     OT Goals(Current goals can be found in the care plan section)    Visit Information  Last OT Received On: 09/28/13 Assistance Needed: +1 History of Present Illness:  She presented to the emergency department with acute onset Lt foot pain and was noted to have no Doppler flow in the left foot and vascular surgery was consult. Pt underwent thrombectomy of previous Lt fempop BPG on 11/14. Pt denies h/o falls, but reports in the past several months she has had significant losses of balance with catching herself on the wall or doorways.       Prior Functioning     Home Living Family/patient expects to be discharged to:: Private residence Living Arrangements: Alone Available Help at Discharge: Family;Friend(s);Available PRN/intermittently Type of Home: House Home Access: Stairs to enter [employer] of Steps: 5 Entrance Stairs-Rails: Right;Left Home Layout: One level Home Equipment:  Crutches Additional Comments: typically  very active; drives Prior Function Level of Independence: Independent Comments: with recent decr balance; denies dizziness, changes in hearing, changes in medication Communication Communication: No difficulties Dominant Hand: Left         Vision/Perception     Cognition  Cognition Arousal/Alertness: Awake/alert Behavior During Therapy: WFL for tasks assessed/performed Overall Cognitive Status: Within Functional Limits for tasks assessed    Extremity/Trunk Assessment Upper Extremity Assessment Upper Extremity Assessment: Defer to OT evaluation Lower Extremity Assessment Lower Extremity Assessment: Defer to PT evaluation Cervical / Trunk Assessment Cervical / Trunk Assessment: Normal     Mobility Bed Mobility Bed Mobility: Supine to Sit;Sitting - Scoot to Edge of Bed;Sit to Supine Supine to Sit: 6: Modified independent (Device/Increase time) Sitting - Scoot to Edge of Bed: 7: Independent Sit to Supine: 6: Modified independent (Device/Increase time) Transfers Transfers: Sit to Stand;Stand to Sit Sit to Stand: 5: Supervision Stand to Sit: 5: Supervision     Exercise     Balance     End of Session OT - End of Session Activity Tolerance: Patient tolerated treatment well Patient left: in bed;with call bell/phone within reach;with family/visitor present Nurse Communication: Patient requests pain meds;Mobility status  GO     Advith Martine M 09/28/2013, 3:50 PM
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
## 2013-09-28 NOTE — Anesthesia Procedure Notes (Signed)
Procedure Name: Intubation Date/Time: 09/28/2013 1:34 AM Performed by: Molli Hazard Pre-anesthesia Checklist: Patient identified, Emergency Drugs available, Suction available and Patient being monitored Patient Re-evaluated:Patient Re-evaluated prior to inductionOxygen Delivery Method: Circle system utilized Preoxygenation: Pre-oxygenation with 100% oxygen Intubation Type: IV induction Ventilation: Mask ventilation without difficulty Laryngoscope Size: Miller and 2 Grade View: Grade I Tube type: Oral Tube size: 8.0 mm Number of attempts: 1 Airway Equipment and Method: Stylet Placement Confirmation: ETT inserted through vocal cords under direct vision,  positive ETCO2 and breath sounds checked- equal and bilateral Secured at: 22 cm Tube secured with: Tape Dental Injury: Teeth and Oropharynx as per pre-operative assessment

## 2013-09-28 NOTE — Anesthesia Postprocedure Evaluation (Signed)
  Anesthesia Post-op Note  Patient: Kristin Knight  Procedure(s) Performed: Procedure(s): Thrombectomy and Revision BYPASS GRAFT FEMORAL-POPLITEAL ARTERY (Left) INTRA OPERATIVE ARTERIOGRAM (Left)  Patient Location: PACU  Anesthesia Type:General  Level of Consciousness: awake, oriented, sedated and patient cooperative  Airway and Oxygen Therapy: Patient Spontanous Breathing  Post-op Pain: mild  Post-op Assessment: Post-op Vital signs reviewed, Patient's Cardiovascular Status Stable, Respiratory Function Stable, Patent Airway, No signs of Nausea or vomiting and Pain level controlled  Post-op Vital Signs: stable  Complications: No apparent anesthesia complications

## 2013-09-28 NOTE — Progress Notes (Signed)
VASCULAR LAB PRELIMINARY  ARTERIAL  ABI completed:  Bilateral ABI within normal limits. Limited quality study due to technical difficulty.     RIGHT    LEFT    PRESSURE WAVEFORM  PRESSURE WAVEFORM  BRACHIAL 96 Triphasic BRACHIAL 92 Triphasic  DP 99 Monophasic DP 100 Monophasic  PT 102 Monophasic PT 94 Triphasic    RIGHT LEFT  ABI 1.06 1.04     Kristin Knight, RCS 09/28/2013, 11:02 AM

## 2013-09-28 NOTE — Progress Notes (Signed)
S/L pt IV since pt is drinking and eating, and pt refused to be connected to the IV any longer since it prevented her from resting.  Notified MD, and awaiting an order.

## 2013-09-28 NOTE — Progress Notes (Addendum)
  VASCULAR SURGERY BYPASS PROGRESS NOTE   Day of Surgery thrombectomy of left femoropopliteal bypass graft and revision  and Intraoperative arteriogram   SUBJECTIVE: Kristin Knight is a 67 y.o. female who had thrombectomy of graft this am. She states her legs feel well. She had significant pain in legs after surgery which is getting better. good sensation and motion   PHYSICAL EXAM: BP Readings from Last 3 Encounters:  09/28/13 110/56  09/28/13 110/56  09/05/13 143/96   Temp Readings from Last 3 Encounters:  09/28/13 97.9 F (36.6 C) Oral  09/28/13 97.9 F (36.6 C) Oral  09/05/13 98 F (36.7 C) Oral   Pulse Readings from Last 3 Encounters:  09/28/13 65  09/28/13 65  09/05/13 80   SpO2 Readings from Last 3 Encounters:  09/28/13 99%  09/28/13 99%  09/05/13 92%     Intake/Output Summary (Last 24 hours) at 09/28/13 1610 Last data filed at 09/28/13 0600  Gross per 24 hour  Intake    920 ml  Output    325 ml  Net    595 ml    Extremities: Incisions clean, dry and intact Pulse status monophasic doppler signal: right PT Left DP/PT Both feet arm warm with good and equal sensation and motion  LABS: Lab Results  Component Value Date   WBC 3.2* 09/27/2013   HGB 12.7 09/27/2013   HCT 38.9 09/27/2013   MCV 90.7 09/27/2013   PLT 198 09/27/2013   Lab Results  Component Value Date   CREATININE 0.88 09/27/2013   Lab Results  Component Value Date   INR 1.55* 09/27/2013       ASSESSMENT: Day of Surgery s/p thrombectomy of left femoropopliteal bypass graft and revision and Intraoperative arteriogram Good perfusion to both legs with doppler flow with good sensation and motion  PLAN:  Ambulate  GU: dc Foley in am _ strict I/O, begin IVF  DVT prophylaxis: on therapeutic lovenox - coumadin Transfer to floor  ROCZNIAK,REGINA J 7:28 AM 09/28/2013    Agree with above.  Left groin incision clean without hematoma. Left foot warm. Ambulated some today already.   Home when pain controlled and INR therapeutic.  Fabienne Bruns, MD [facility] Office: [phone] Pager: [phone]
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
## 2013-09-28 NOTE — Preoperative (Signed)
Beta Blockers   Reason not to administer Beta Blockers:Not Applicable 

## 2013-09-28 NOTE — Anesthesia Preprocedure Evaluation (Addendum)
Anesthesia Evaluation  Patient identified by MRN, date of birth, ID band Patient awake    Reviewed: Allergy & Precautions, H&P , NPO status , Patient's Chart, lab work & pertinent test results  Airway Mallampati: I TM Distance: >3 FB Neck ROM: Full    Dental  (+) Edentulous Upper   Pulmonary COPDCurrent Smoker,          Cardiovascular hypertension, + Peripheral Vascular Disease     Neuro/Psych PSYCHIATRIC DISORDERS    GI/Hepatic   Endo/Other  diabetes, Type 2  Renal/GU Renal disease     Musculoskeletal   Abdominal   Peds  Hematology  (+) anemia ,   Anesthesia Other Findings   Reproductive/Obstetrics                        Anesthesia Physical Anesthesia Plan  ASA: III and emergent  Anesthesia Plan: General   Post-op Pain Management:    Induction: Intravenous  Airway Management Planned: Oral ETT  Additional Equipment:   Intra-op Plan:   Post-operative Plan: Extubation in OR  Informed Consent: I have reviewed the patients History and Physical, chart, labs and discussed the procedure including the risks, benefits and alternatives for the proposed anesthesia with the patient or authorized representative who has indicated his/her understanding and acceptance.     Plan Discussed with: Anesthesiologist, CRNA and Surgeon  Anesthesia Plan Comments:        Anesthesia Quick Evaluation

## 2013-09-28 NOTE — ED Notes (Signed)
Dr. Norlene Campbell at bedside assessing patients pedal pulses with doppler.

## 2013-09-28 NOTE — Op Note (Signed)
NAME: Kristin Knight    MRN: [id number] DOB: Apr 10, 1946    DATE OF OPERATION: 09/28/2013  PREOP DIAGNOSIS: ischemic left leg an occluded left femoropopliteal bypass graft  POSTOP DIAGNOSIS: same  PROCEDURE:  1. thrombectomy of left femoropopliteal bypass graft and revision 2. Intraoperative arteriogram  SURGEON: Di Kindle. Edilia Bo, MD, FACS  ASSIST: None  ANESTHESIA: Gen.   EBL: minimal  INDICATIONS: Kristin Knight is a 67 y.o. female had a left femoropopliteal bypass graft with PTFE in 2012. She subsequently required thrombolysis in October of 2013. She presents with an occluded graft in an ischemic left lower extremity.  FINDINGS: there was intimal hyperplasia at the heel of the proximal anastomosis and therefore I revised this with an interposition segment of 6 mm PTFE. Completion arteriogram showed no significant changes distally in her runoff through the posterior tibial and peroneal arteries.  TECHNIQUE: The patient was taken to the operating room and received a general anesthetic. The left lower extremity was prepped and draped in the usual sterile fashion. Incision in the left groin was opened. 2 scar tissue the proximal graft was dissected free. I controlled the common femoral artery, deep femoral artery, and superficial femoral artery. The proximal graft was also controlled. The patient was heparinized. A transverse graftotomy was made in the hood of the anastomosis. Thrombectomy was achieved using a #4 from a catheter. I was able to pass a catheter the entire length without obstruction. Once no further clot was retrieved the artery was irrigated with heparinized saline. And inspected the anastomosis. There was intimal hyperplasia at the heel of the anastomosis which appeared to be significant. Therefore let her thighs the proximal anastomosis. I excised the old anastomosis and a new segment of 6 mm PTFE was brought to the field. I did pass the Fogarty catheter proximally and no clot  was retrieved. The 6 mm PTFE was spatulated and sewn end to side to the common femoral artery using continuous 6-0 Prolene suture. The arteries were backbled and flushed appropriately before the anastomosis was completed. The graft was then clamped area was cut to the appropriate length for anastomosis to the old graft. This anastomosis was done continuous 6-0 Prolene suture. Flow was then established the left leg. There was a good posterior tibial signal with the Doppler. A completion arteriogram was obtained which showed no significant changes distally in the runoff through the peroneal and posterior tibial arteries. There was minimal irregularity noted at the distal anastomosis which was not significantly different from the completion arteriogram a year ago. The heparin was partially reversed with protamine. There was a good posterior tibial signal with the Doppler. The calf was soft with no evidence of significant swelling or compartment syndrome.The wounds closed with 2 deep layers of 2-0 Vicryl. The skin was closed with 4-0 subcuticular stitch. Dermabond was applied. The patient tolerated the procedure well was transferred to the recovery room in stable condition. All needle and sponge counts were correct.  Waverly Ferrari, MD, FACS [facility]  DATE OF DICTATION:   09/28/2013

## 2013-09-28 NOTE — Progress Notes (Signed)
Transferred to 2W09 via wheelchair. Portable monitor on. No changes. Report given to [place], Charity fundraiser

## 2013-09-28 NOTE — Progress Notes (Signed)
Chaplain responded to spiritual care consult. Pt was lying in bed visiting with large family. Pt asked chaplain to return another time. Chaplain explained that pt can let her nurse know if she would like to speak to a chaplain over the weekend.   Maurene Capes [phone]

## 2013-09-28 NOTE — Progress Notes (Signed)
Utilization review completed. Nicki Furlan, RN, BSN. 
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
## 2013-09-28 NOTE — Transfer of Care (Signed)
Immediate Anesthesia Transfer of Care Note  Patient: Kristin Knight  Procedure(s) Performed: Procedure(s): Thrombectomy and Revision BYPASS GRAFT FEMORAL-POPLITEAL ARTERY (Left) INTRA OPERATIVE ARTERIOGRAM (Left)  Patient Location: PACU  Anesthesia Type:General  Level of Consciousness: awake, alert  and oriented  Airway & Oxygen Therapy: Patient Spontanous Breathing and Patient connected to nasal cannula oxygen  Post-op Assessment: Report given to PACU RN, Post -op Vital signs reviewed and stable and Patient moving all extremities X 4  Post vital signs: Reviewed and stable  Complications: No apparent anesthesia complications

## 2013-09-29 DIAGNOSIS — E441 Mild protein-calorie malnutrition: Secondary | ICD-10-CM | POA: Insufficient documentation

## 2013-09-29 LAB — BASIC METABOLIC PANEL
BUN: 8 mg/dL (ref 6–23)
CO2: 31 mEq/L (ref 19–32)
Chloride: 97 mEq/L (ref 96–112)
GFR calc Af Amer: >90 mL/min (ref >90)
Glucose, Bld: 122 mg/dL — ABNORMAL HIGH (ref 70–99)
Potassium: 3.5 mEq/L (ref 3.5–5.1)

## 2013-09-29 LAB — CBC
HCT: 35 % — ABNORMAL LOW (ref 36.0–46.0)
Hemoglobin: 11.3 g/dL — ABNORMAL LOW (ref 12.0–15.0)
MCH: 29.3 pg (ref 26.0–34.0)
RBC: 3.86 MIL/uL — ABNORMAL LOW (ref 3.87–5.11)
WBC: 4.4 10*3/uL (ref 4.0–10.5)

## 2013-09-29 LAB — PROTIME-INR: INR: 2.22 — ABNORMAL HIGH (ref 0.00–1.49)

## 2013-09-29 NOTE — Progress Notes (Addendum)
[facility] [place]  Daily Progress Note  Assessment/Planning: POD #1 s/p TE L fem-pop with revision prox anastomosis, protein malnutrition, acute blood loss related to surgery   Pt recovering appropriately  ABI noted and appropriate  Pt already therapeutic on coumadin/Lovenox  PT/OT: home health PT identified, will start the process  Likely home early part of next week  Subjective  - 1 Day Post-Op  Pain ok  Objective Filed Vitals:   09/28/13 1151 09/28/13 1345 09/28/13 2019 09/29/13 0425  BP: 114/70 115/68 93/57 96/60   Pulse: 65 66 66 69  Temp: 97.9 F (36.6 C) 97.5 F (36.4 C) 99.1 F (37.3 C) 98.6 F (37 C)  TempSrc: Oral Oral Oral Oral  Resp: 18 18 17 16   Height:      Weight:      SpO2: 95% 94% 92% 94%    Intake/Output Summary (Last 24 hours) at 09/29/13 0836 Last data filed at 09/29/13 0426  Gross per 24 hour  Intake    825 ml  Output   1475 ml  Net   -650 ml    PULM  CTAB CV  RRR GI  soft, NTND VASC  L groin c/d/i, warm foot with faintly palpable PT  Laboratory CBC    Component Value Date/Time   WBC 4.4 09/29/2013 0100   HGB 11.3* 09/29/2013 0100   HCT 35.0* 09/29/2013 0100   PLT 165 09/29/2013 0100    BMET    Component Value Date/Time   NA 134* 09/29/2013 0100   K 3.5 09/29/2013 0100   CL 97 09/29/2013 0100   CO2 31 09/29/2013 0100   GLUCOSE 122* 09/29/2013 0100   BUN 8 09/29/2013 0100   CREATININE 0.73 09/29/2013 0100   CREATININE 0.86 11/24/2012 1112   CALCIUM 8.6 09/29/2013 0100   GFRNONAA 87* 09/29/2013 0100   GFRAA >90 09/29/2013 0100   ABI  RIGHT    LEFT     PRESSURE  WAVEFORM   PRESSURE  WAVEFORM   BRACHIAL  96  Triphasic  BRACHIAL  92  Triphasic   DP  99  Monophasic  DP  100  Monophasic   PT  102  Monophasic  PT  94  Triphasic     RIGHT  LEFT   ABI  1.06  1.04      Leonides Sake, MD [facility] [place] Office: [phone] Pager: [phone]  09/29/2013, 8:36  AM

## 2013-09-29 NOTE — Progress Notes (Signed)
Physical Therapy Treatment Patient Details Name: Kristin Knight MRN: [id number] DOB: 03-19-1946 Today's Date: 09/29/2013 Time: 6578-4696 PT Time Calculation (min): 16 min  PT Assessment / Plan / Recommendation  History of Present Illness  She presented to the emergency department with acute onset Lt foot pain and was noted to have no Doppler flow in the left foot and vascular surgery was consult. Pt underwent thrombectomy of previous Lt fempop BPG on 11/14. Pt denies h/o falls, but reports in the past several months she has had significant losses of balance with catching herself on the wall or doorways.   PT Comments   Pt moving slightly easier today. Did not do well with cane and did better today overall without a device. Scored 18/24 on Dynamic Gait Index, mostly due to slowing velocity when attempting tasks and once drifted off a straight path. Will benefit from continued PT for gait and balance training to decr her fall risk.    Follow Up Recommendations  Home health PT;Supervision - Intermittent     Does the patient have the potential to tolerate intense rehabilitation     Barriers to Discharge        Equipment Recommendations  None recommended by PT    Recommendations for Other Services OT consult  Frequency Min 3X/week   Progress towards PT Goals Progress towards PT goals: Progressing toward goals  Plan Current plan remains appropriate    Precautions / Restrictions Precautions Precautions: Fall Precaution Comments: recent near falls   Pertinent Vitals/Pain "just a little sore" re: LLE    Mobility  Bed Mobility Bed Mobility: Supine to Sit;Sitting - Scoot to Edge of Bed Supine to Sit: 7: Independent Sitting - Scoot to [place] of Bed: 7: Independent Transfers Transfers: Sit to Stand;Stand to Sit Sit to Stand: 7: Independent Stand to Sit: 7: Independent Details for Transfer Assistance: no unsteadiness noted Ambulation/Gait Ambulation/Gait Assistance: 4: Min guard;5:  Supervision Ambulation Distance (Feet): 400 Feet Assistive device: None;Straight cane Ambulation/Gait Assistance Details: pt with very broken, halted gait with cane with significant decr in velocity; appeared less confident, steady with incr focus on coordinating with cane; ambulated without device with only one small deviation from straight path when looking to her Lt (she drifted to her Rt) Gait Pattern: Step-through pattern;Decreased stride length;Wide base of support Stairs: Yes Stairs Assistance: 4: Min guard Stair Management Technique: One rail Right;Forwards;Alternating pattern Number of Stairs: 10    Exercises     PT Diagnosis:    PT Problem List:   PT Treatment Interventions:     PT Goals (current goals can now be found in the care plan section) Acute Rehab PT Goals Patient Stated Goal: doesn't want to fall and doesn't want to use walker or cane (if possible) PT Goal Formulation: With patient Time For Goal Achievement: 10/02/13 Potential to Achieve Goals: Good  Visit Information  Last PT Received On: 09/29/13 Assistance Needed: +1 History of Present Illness:  She presented to the emergency department with acute onset Lt foot pain and was noted to have no Doppler flow in the left foot and vascular surgery was consult. Pt underwent thrombectomy of previous Lt fempop BPG on 11/14. Pt denies h/o falls, but reports in the past several months she has had significant losses of balance with catching herself on the wall or doorways.    Subjective Data  Subjective: Again reports she has had significant losses of balance; "I think it's when I'm not paying attention" Patient Stated Goal: doesn't want to fall  and doesn't want to use walker or cane (if possible)   Cognition  Cognition Arousal/Alertness: Awake/alert Behavior During Therapy: WFL for tasks assessed/performed Overall Cognitive Status: Within Functional Limits for tasks assessed    Balance  Balance Balance Assessed:  Yes Static Standing Balance Static Standing - Balance Support: No upper extremity supported Static Standing - Level of Assistance: 7: Independent Rhomberg - Eyes Closed: 45 Standardized Balance Assessment Standardized Balance Assessment: Dynamic Gait Index Berg Balance Test Standing Unsupported with Eyes Closed: Able to stand 10 seconds with supervision From Standing Position, Pick up Object from Floor: Able to pick up shoe safely and easily Turn 360 Degrees: Able to turn 360 degrees safely in 4 seconds or less Standing Unsupported, Alternately Place Feet on Step/Stool: Able to complete 4 steps without aid or supervision Dynamic Gait Index Level Surface: Normal Change in Gait Speed: Normal Gait with Horizontal Head Turns: Mild Impairment Gait with Vertical Head Turns: Mild Impairment Gait and Pivot Turn: Mild Impairment Step Over Obstacle: Mild Impairment Step Around Obstacles: Mild Impairment Steps: Mild Impairment Total Score: 18  End of Session PT - End of Session Activity Tolerance: Patient tolerated treatment well Patient left: in chair;with call bell/phone within reach Nurse Communication: Mobility status   GP     Lakeia Bradshaw 09/29/2013, 9:43 AM Pager [phone]

## 2013-09-30 LAB — BASIC METABOLIC PANEL
BUN: 10 mg/dL (ref 6–23)
CO2: 33 mEq/L — ABNORMAL HIGH (ref 19–32)
Chloride: 101 mEq/L (ref 96–112)
Creatinine, Ser: 0.79 mg/dL (ref 0.50–1.10)
GFR calc Af Amer: >90 mL/min (ref >90)
GFR calc non Af Amer: 85 mL/min — ABNORMAL LOW (ref >90)
Glucose, Bld: 85 mg/dL (ref 70–99)
Potassium: 4.1 mEq/L (ref 3.5–5.1)

## 2013-09-30 LAB — CBC
HCT: 38.6 % (ref 36.0–46.0)
MCH: 29.6 pg (ref 26.0–34.0)
MCHC: 32.4 g/dL (ref 30.0–36.0)
RBC: 4.22 MIL/uL (ref 3.87–5.11)
RDW: 14.2 % (ref 11.5–15.5)

## 2013-09-30 LAB — PROTIME-INR: Prothrombin Time: 21.9 seconds — ABNORMAL HIGH (ref 11.6–15.2)

## 2013-09-30 NOTE — Progress Notes (Signed)
   CARE MANAGEMENT NOTE 09/30/2013  Patient:  MATTINGLY, FOUNTAINE   Account Number:  [id number]  Date Initiated:  09/30/2013  Documentation initiated by:  [facility]  Subjective/Objective Assessment:   adm: Leg Pain     Action/Plan:   discharge planning   Anticipated DC Date:  10/01/2013   Anticipated DC Plan:  HOME W HOME HEALTH SERVICES      DC Planning Services  CM consult      [facility] Choice  HOME HEALTH   Choice offered to / List presented to:  C-1 Patient        HH arranged  HH-2 PT      [facility] agency  [facility]   Status of service:  In process, will continue to follow Medicare Important Message given?   (If response is "NO", the following Medicare IM given date fields will be blank) Date Medicare IM given:   Date Additional Medicare IM given:    Discharge Disposition:    Per UR Regulation:    If discussed at Long Length of Stay Meetings, dates discussed:    Comments:  09/30/13 15:30 CM spoke to pt to offer choice for HHPT.  Pt states she has had [facility] in past and wants them again. Address and contact numbers verified.  Referral faxed to [facility] for pending HHPT.  Sticky note placed for MD to write for HHPT.  CM will continue to follow for discharge needs. Freddy Jaksch, BSN, CM [phone].

## 2013-09-30 NOTE — Progress Notes (Signed)
[facility] [place]  Daily Progress Note   Assessment/Planning:  POD #2 s/p TE L fem-pop with revision prox anastomosis, protein malnutrition, acute blood loss related to surgery   Likely home tomorrow: family not available to provide help until tomorrow  Awaiting home services   Subjective  - 2 Days Post-Op  No complaints  Objective Filed Vitals:   09/29/13 0425 09/29/13 1402 09/29/13 1958 09/30/13 0439  BP: 96/60 96/60 139/88 126/82  Pulse: 69 60 63 61  Temp: 98.6 F (37 C) 98.5 F (36.9 C) 98.2 F (36.8 C) 98.6 F (37 C)  TempSrc: Oral Oral Oral Oral  Resp: 16 16 17 16   Height:      Weight:      SpO2: 94% 98% 98% 100%    Intake/Output Summary (Last 24 hours) at 09/30/13 0831 Last data filed at 09/30/13 0981  Gross per 24 hour  Intake    440 ml  Output      0 ml  Net    440 ml    PULM  CTAB CV  RRR GI  soft, NTND VASC  L groin incision c/d/i, no hematoma, warm foot  Laboratory CBC    Component Value Date/Time   WBC 4.4 09/30/2013 0530   HGB 12.5 09/30/2013 0530   HCT 38.6 09/30/2013 0530   PLT 202 09/30/2013 0530    BMET    Component Value Date/Time   NA 142 09/30/2013 0530   K 4.1 09/30/2013 0530   CL 101 09/30/2013 0530   CO2 33* 09/30/2013 0530   GLUCOSE 85 09/30/2013 0530   BUN 10 09/30/2013 0530   CREATININE 0.79 09/30/2013 0530   CREATININE 0.86 11/24/2012 1112   CALCIUM 9.4 09/30/2013 0530   GFRNONAA 85* 09/30/2013 0530   GFRAA >90 09/30/2013 0530    Leonides Sake, MD [facility] [place] Office: [phone] Pager: [phone]  09/30/2013, 8:31 AM

## 2013-09-30 NOTE — Progress Notes (Signed)
ANTICOAGULATION CONSULT NOTE - Follow Up Consult  Pharmacy Consult for lovenox Indication: PVD  Allergies  Allergen Reactions  . Meperidine Hcl Other (See Comments)    nevousness  . Penicillins Other (See Comments)    Patient passed out    Patient Measurements: Height: 5\' 4"  (162.6 cm) Weight: 101 lb 10.1 oz (46.1 kg) IBW/kg (Calculated) : 54.7   Vital Signs: Temp: 98.6 F (37 C) (11/16 0439) Temp src: Oral (11/16 0439) BP: 126/82 mmHg (11/16 0439) Pulse Rate: 61 (11/16 0439)  Labs:  Recent Labs  09/27/13 2245 09/29/13 0100 09/30/13 0530  HGB 12.7 11.3* 12.5  HCT 38.9 35.0* 38.6  PLT 198 165 202  LABPROT 18.2* 23.9* 21.9*  INR 1.55* 2.22* 1.98*  CREATININE 0.88 0.73 0.79    Estimated Creatinine Clearance: 50.3 ml/min (by C-G formula based on Cr of 0.79).  Assessment: Patient is a 67 y.o F on anticoagulation for PAD.  INR down slightly today at 1.98.  All doses of coumadin were charted.  CBC is stable and no bleeding noted.  Plan for possible d/c home on 11/17.  Goal of Therapy:  Anti-Xa level 0.6-1.2 units/ml 4hrs after LMWH dose given; INR 2-3 Monitor platelets by anticoagulation protocol: Yes   Plan:  1) no change for lovenox 2) coumadin 4mg  daily per MD dosing 3) Please note home coumadin regimen PTA was 4mg  daily except 6mg  on Mondays  Kristin Knight 09/30/2013,10:31 AM

## 2013-10-01 ENCOUNTER — Telehealth: Payer: Self-pay | Admitting: Vascular Surgery

## 2013-10-01 ENCOUNTER — Encounter (HOSPITAL_COMMUNITY): Payer: Self-pay | Admitting: Vascular Surgery

## 2013-10-01 LAB — PROTIME-INR: INR: 2.09 — ABNORMAL HIGH (ref 0.00–1.49)

## 2013-10-01 MED ORDER — TRAMADOL HCL 50 MG PO TABS: 50.0000 mg | ORAL_TABLET | Freq: Four times a day (QID) | ORAL | Status: DC | PRN

## 2013-10-01 NOTE — Progress Notes (Addendum)
[facility] [place]  Subjective  -" I'm doing fine.  I don't need any home therapy or rolling walker."  Objective 113/75 60 97.9 F (36.6 C) (Oral) 17 98%  Intake/Output Summary (Last 24 hours) at 10/01/13 0731 Last data filed at 10/01/13 0600  Gross per 24 hour  Intake    720 ml  Output    600 ml  Net    120 ml   Left groin soft without hematoma Distal PT palpable, skin warm to touch N/V/M intact distal left extremity  Assessment/Planning: Procedure(s): Thrombectomy and Revision BYPASS GRAFT FEMORAL-POPLITEAL ARTERY INTRA OPERATIVE ARTERIOGRAM  3 Days Post-OpSurgeon(s): Chuck Hint, MD  Taking PO's well and ambulating without assistive devise. Plan D/C home INR 2.0   Clinton Gallant [facility] 10/01/2013 7:31 AM  Agree with above. I have again discussed with her the importance of tobacco cessation. For D/C today. Coumadin therapeutic.  Waverly Ferrari, MD, FACS Beeper [phone] 10/01/2013
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
## 2013-10-01 NOTE — Telephone Encounter (Addendum)
Message copied by Fredrich Birks on Mon Oct 01, 2013 11:19 AM ------      Message from: Melene Plan      Created: Mon Oct 01, 2013  9:50 AM                   ----- Message -----         From: Lars Mage, PA-C         Sent: 10/01/2013   7:37 AM           To: Melene Plan, RN            F/U with Dr. Edilia Bo in 4 weeks.  S/P embolectomy and revision left fem-pop. ------  10/01/13: lm for patient, dpm

## 2013-10-15 ENCOUNTER — Ambulatory Visit (INDEPENDENT_AMBULATORY_CARE_PROVIDER_SITE_OTHER): Payer: Medicare Other | Admitting: Pharmacist

## 2013-10-15 DIAGNOSIS — Z7901 Long term (current) use of anticoagulants: Secondary | ICD-10-CM

## 2013-10-15 DIAGNOSIS — I743 Embolism and thrombosis of arteries of the lower extremities: Secondary | ICD-10-CM

## 2013-10-15 DIAGNOSIS — I779 Disorder of arteries and arterioles, unspecified: Secondary | ICD-10-CM

## 2013-10-15 DIAGNOSIS — Z9889 Other specified postprocedural states: Secondary | ICD-10-CM | POA: Diagnosis not present
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
## 2013-10-15 NOTE — Patient Instructions (Signed)
Patient instructed to take medications as defined in the Anti-coagulation Track section of this encounter.  Patient instructed to take today's dose.  Patient verbalized understanding of these instructions.  Patient instructed on use of LMWH enoxaparin/lovenox 50mg  (out of 60mg  syringe--instructed to express 10mg /0.52ml OUT and inject remaining amount of Lovenox) subcutaneously every 12 hours. 8 syringes provided.

## 2013-10-15 NOTE — Progress Notes (Signed)
I reviewed chart and discussed patient with Dr. Alexandria Lodge.  Given recent thrombectomy and subtherapeutic INR, I agree with plan to bridge with Lovenox while adjusting warfarin to achieve therapeutic INR.

## 2013-10-19 ENCOUNTER — Other Ambulatory Visit (INDEPENDENT_AMBULATORY_CARE_PROVIDER_SITE_OTHER): Payer: Medicare Other

## 2013-10-19 ENCOUNTER — Telehealth: Payer: Self-pay | Admitting: *Deleted

## 2013-10-19 DIAGNOSIS — Z7901 Long term (current) use of anticoagulants: Secondary | ICD-10-CM

## 2013-10-19 NOTE — Telephone Encounter (Signed)
Lab put a call in to Dr Alexandria Lodge about INR 2.8 this AM. Pt is taking warfarin  1.5 tab each day as Dr Alexandria Lodge instructed pt to do since 10/15/13. Pt was instructed per Dr Alexandria Lodge via phone  to take warfarin  one tablet on 10/19/13, one tablet on 10/20/13 and one tablet 10/21/13 and to see Dr Alexandria Lodge 10/22/13 - appt sch 2PM. Pt starts she did last Lovenox injection this AM. Stanton Kidney Mieshia Pepitone RN 10/19/13 10AM

## 2013-10-21 ENCOUNTER — Other Ambulatory Visit: Payer: Self-pay | Admitting: Internal Medicine

## 2013-10-22 ENCOUNTER — Ambulatory Visit (INDEPENDENT_AMBULATORY_CARE_PROVIDER_SITE_OTHER): Payer: Medicare Other | Admitting: Pharmacist

## 2013-10-22 DIAGNOSIS — Z7901 Long term (current) use of anticoagulants: Secondary | ICD-10-CM

## 2013-10-22 DIAGNOSIS — I779 Disorder of arteries and arterioles, unspecified: Secondary | ICD-10-CM

## 2013-10-22 DIAGNOSIS — I743 Embolism and thrombosis of arteries of the lower extremities: Secondary | ICD-10-CM | POA: Diagnosis not present

## 2013-10-22 DIAGNOSIS — I739 Peripheral vascular disease, unspecified: Secondary | ICD-10-CM | POA: Diagnosis not present

## 2013-10-22 DIAGNOSIS — Z48812 Encounter for surgical aftercare following surgery on the circulatory system: Secondary | ICD-10-CM | POA: Diagnosis not present

## 2013-10-22 LAB — POCT INR: INR: 2.3

## 2013-10-22 MED ORDER — WARFARIN SODIUM 4 MG PO TABS: ORAL_TABLET | ORAL | Status: DC

## 2013-10-22 NOTE — Progress Notes (Signed)
Anti-Coagulation Progress Note  Kristin Knight is a 67 y.o. female who is currently on an anti-coagulation regimen.    RECENT RESULTS: Recent results are below, the most recent result is correlated with a dose of 34 mg. per week: Lab Results  Component Value Date   INR 2.3 10/22/2013   INR 2.8 10/19/2013   INR 1.50 10/15/2013    ANTI-COAG DOSE: Anticoagulation Dose Instructions as of 10/22/2013     [name] Tue Wed Thu Fri Sat   New Dose 6 mg 6 mg 4 mg 6 mg 4 mg 6 mg 4 mg       ANTICOAG SUMMARY: Anticoagulation Episode Summary   Current INR goal 2.0-3.0  Next INR check 11/05/2013  INR from last check 2.3 (10/22/2013)  Weekly max dose   Target end date   INR [facility]  Preferred lab   Send INR reminders to    Indications  Peripheral arterial occlusive disease [444.22] Long term (current) use of anticoagulants [V58.61]        Comments         ANTICOAG TODAY: Anticoagulation Summary as of 10/22/2013   INR goal 2.0-3.0  Selected INR 2.3 (10/22/2013)  Next INR check 11/05/2013  Target end date    Indications  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  Patient instructed to take today's dose.  Patient verbalized understanding of these instructions.       FOLLOW-UP Return in 2 weeks (on 11/05/2013) for Follow up INR at 2:15PM.  Hulen Luster, III Pharm.D., CACP

## 2013-10-22 NOTE — Patient Instructions (Signed)
Patient instructed to take medications as defined in the Anti-coagulation Track section of this encounter.  Patient instructed to take today's dose.  Patient verbalized understanding of these instructions.    

## 2013-10-23 NOTE — Telephone Encounter (Signed)
Pt kept appt with Dr Lasandra Beech 10/22/13.

## 2013-10-30 ENCOUNTER — Ambulatory Visit (HOSPITAL_COMMUNITY): Payer: Self-pay | Admitting: Psychology

## 2013-10-30 ENCOUNTER — Encounter: Payer: Self-pay | Admitting: Vascular Surgery

## 2013-10-31 ENCOUNTER — Ambulatory Visit: Payer: Self-pay | Admitting: Vascular Surgery

## 2013-10-31 NOTE — Progress Notes (Unsigned)
[name], PA [place], [place] extremity Dr. Imogene Burn

## 2013-11-05 ENCOUNTER — Ambulatory Visit: Payer: Self-pay

## 2013-11-12 ENCOUNTER — Ambulatory Visit (INDEPENDENT_AMBULATORY_CARE_PROVIDER_SITE_OTHER): Payer: Medicare Other | Admitting: Pharmacist

## 2013-11-12 DIAGNOSIS — I743 Embolism and thrombosis of arteries of the lower extremities: Secondary | ICD-10-CM | POA: Diagnosis not present

## 2013-11-12 DIAGNOSIS — I779 Disorder of arteries and arterioles, unspecified: Secondary | ICD-10-CM

## 2013-11-12 DIAGNOSIS — Z7901 Long term (current) use of anticoagulants: Secondary | ICD-10-CM

## 2013-11-12 LAB — POCT INR: INR: 2.3

## 2013-11-12 NOTE — Patient Instructions (Signed)
Patient instructed to take medications as defined in the Anti-coagulation Track section of this encounter.  Patient instructed to take today's dose.  Patient verbalized understanding of these instructions.    

## 2013-11-12 NOTE — Progress Notes (Signed)
Anti-Coagulation Progress Note  Kristin Knight is a 67 y.o. female who is currently on an anti-coagulation regimen.    RECENT RESULTS: Recent results are below, the most recent result is correlated with a dose of 36 mg. per week: Lab Results  Component Value Date   INR 2.30 11/12/2013   INR 2.3 10/22/2013   INR 2.8 10/19/2013    ANTI-COAG DOSE: Anticoagulation Dose Instructions as of 11/12/2013     [name] Tue Wed Thu Fri Sat   New Dose 6 mg 6 mg 4 mg 6 mg 4 mg 6 mg 4 mg       ANTICOAG SUMMARY: Anticoagulation Episode Summary   Current INR goal 2.0-3.0  Next INR check 12/10/2013  INR from last check 2.30 (11/12/2013)  Weekly max dose   Target end date   INR check location [facility]  Preferred lab   Send INR reminders to    Indications  Peripheral arterial occlusive disease [444.22] Long term (current) use of anticoagulants [V58.61]        Comments         ANTICOAG TODAY: Anticoagulation Summary as of 11/12/2013   INR goal 2.0-3.0  Selected INR 2.30 (11/12/2013)  Next INR check 12/10/2013  Target end date    Indications  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  Patient instructed to take today's dose.  Patient verbalized understanding of these instructions.       FOLLOW-UP Return in 4 weeks (on 12/10/2013) for Follow up INR at 2:45pm.  Hulen Luster, III Pharm.D., CACP

## 2013-11-14 ENCOUNTER — Ambulatory Visit (HOSPITAL_COMMUNITY): Payer: Medicare Other | Admitting: Psychiatry

## 2013-11-23 ENCOUNTER — Ambulatory Visit (INDEPENDENT_AMBULATORY_CARE_PROVIDER_SITE_OTHER): Payer: Medicare Other | Admitting: Internal Medicine

## 2013-11-23 ENCOUNTER — Ambulatory Visit (HOSPITAL_COMMUNITY)
Admission: RE | Admit: 2013-11-23 | Discharge: 2013-11-23 | Disposition: A | Discharge status: Home / Self Care | Payer: Medicare Other | Source: Ambulatory Visit | Attending: Internal Medicine | Admitting: Internal Medicine

## 2013-11-23 ENCOUNTER — Encounter: Payer: Self-pay | Admitting: Internal Medicine

## 2013-11-23 VITALS — BP 140/99 | HR 62 | Temp 97.0°F | Wt 108.4 lb

## 2013-11-23 DIAGNOSIS — M899 Disorder of bone, unspecified: Secondary | ICD-10-CM | POA: Insufficient documentation

## 2013-11-23 DIAGNOSIS — E441 Mild protein-calorie malnutrition: Secondary | ICD-10-CM | POA: Diagnosis not present

## 2013-11-23 DIAGNOSIS — M5380 Other specified dorsopathies, site unspecified: Secondary | ICD-10-CM | POA: Diagnosis not present

## 2013-11-23 DIAGNOSIS — Z7901 Long term (current) use of anticoagulants: Secondary | ICD-10-CM | POA: Diagnosis not present

## 2013-11-23 DIAGNOSIS — F32A Depression, unspecified: Secondary | ICD-10-CM

## 2013-11-23 DIAGNOSIS — M549 Dorsalgia, unspecified: Secondary | ICD-10-CM | POA: Insufficient documentation

## 2013-11-23 DIAGNOSIS — F17200 Nicotine dependence, unspecified, uncomplicated: Secondary | ICD-10-CM

## 2013-11-23 DIAGNOSIS — M5134 Other intervertebral disc degeneration, thoracic region: Secondary | ICD-10-CM

## 2013-11-23 DIAGNOSIS — Z9889 Other specified postprocedural states: Secondary | ICD-10-CM | POA: Diagnosis not present

## 2013-11-23 DIAGNOSIS — F331 Major depressive disorder, recurrent, moderate: Secondary | ICD-10-CM | POA: Diagnosis not present

## 2013-11-23 DIAGNOSIS — I739 Peripheral vascular disease, unspecified: Secondary | ICD-10-CM

## 2013-11-23 DIAGNOSIS — Z720 Tobacco use: Secondary | ICD-10-CM

## 2013-11-23 DIAGNOSIS — Z0000 Encounter for general adult medical examination without abnormal findings: Secondary | ICD-10-CM

## 2013-11-23 DIAGNOSIS — I10 Essential (primary) hypertension: Secondary | ICD-10-CM | POA: Diagnosis not present

## 2013-11-23 DIAGNOSIS — T82868D Thrombosis of vascular prosthetic devices, implants and grafts, subsequent encounter: Secondary | ICD-10-CM

## 2013-11-23 DIAGNOSIS — I743 Embolism and thrombosis of arteries of the lower extremities: Secondary | ICD-10-CM

## 2013-11-23 DIAGNOSIS — F329 Major depressive disorder, single episode, unspecified: Secondary | ICD-10-CM

## 2013-11-23 DIAGNOSIS — M949 Disorder of cartilage, unspecified: Secondary | ICD-10-CM

## 2013-11-23 DIAGNOSIS — M19049 Primary osteoarthritis, unspecified hand: Secondary | ICD-10-CM | POA: Insufficient documentation

## 2013-11-23 DIAGNOSIS — I779 Disorder of arteries and arterioles, unspecified: Secondary | ICD-10-CM

## 2013-11-23 MED ORDER — NICOTINE 21 MG/24HR TD PT24: 21.0000 mg | MEDICATED_PATCH | TRANSDERMAL | Status: DC

## 2013-11-23 MED ORDER — NICOTINE 14 MG/24HR TD PT24
14.0000 mg | MEDICATED_PATCH | TRANSDERMAL | Status: DC
Start: 2013-11-23 — End: 2014-02-15

## 2013-11-23 MED ORDER — PRAVASTATIN SODIUM 20 MG PO TABS: 20.0000 mg | ORAL_TABLET | Freq: Every day | ORAL | Status: DC

## 2013-11-23 MED ORDER — FLUOXETINE HCL 40 MG PO CAPS: 40.0000 mg | ORAL_CAPSULE | Freq: Every day | ORAL | Status: DC

## 2013-11-23 MED ORDER — ACETAMINOPHEN 500 MG PO TABS: 1000.0000 mg | ORAL_TABLET | Freq: Four times a day (QID) | ORAL | Status: DC | PRN

## 2013-11-23 MED ORDER — NICOTINE 7 MG/24HR TD PT24: 7.0000 mg | MEDICATED_PATCH | TRANSDERMAL | Status: DC

## 2013-11-23 NOTE — Progress Notes (Signed)
   Subjective:    Patient ID: Kristin Knight, female    DOB: 1946-06-06, 68 y.o.   MRN: [id number]  HPI  Please see the A&P for the status of the pt's chronic medical problems.  Review of Systems  Constitutional: Negative for unexpected weight change.  HENT: Negative for postnasal drip, rhinorrhea and sinus pressure.   Respiratory: Negative for shortness of breath and wheezing.   Cardiovascular: Negative for chest pain and leg swelling.  Gastrointestinal: Negative for abdominal pain and abdominal distention.  Musculoskeletal: Positive for arthralgias, back pain, myalgias, neck pain and neck stiffness. Negative for joint swelling.  Skin: Negative for wound.  Allergic/Immunologic: Negative for environmental allergies.  Psychiatric/Behavioral: Positive for dysphoric mood.      Objective:   Physical Exam  Nursing note and vitals reviewed. Constitutional: She is oriented to person, place, and time. She appears well-developed and well-nourished. No distress.  HENT:  Head: Normocephalic and atraumatic.  Eyes: Conjunctivae are normal. Right eye exhibits no discharge. Left eye exhibits no discharge. No scleral icterus.  Neck: Normal range of motion. Neck supple.  Tightness or left trapezius.  Mild tenderness along cervical spine to palpation.  Cardiovascular: Normal rate, regular rhythm and normal heart sounds.  Exam reveals no gallop and no friction rub.   No murmur heard. Pulmonary/Chest: Effort normal and breath sounds normal. No respiratory distress. She has no wheezes. She has no rales.  Abdominal: Soft. Bowel sounds are normal. She exhibits no distension. There is no tenderness. There is no rebound and no guarding.  Musculoskeletal: Normal range of motion. She exhibits no edema and no tenderness.  Neurological: She is alert and oriented to person, place, and time. She exhibits normal muscle tone.  Skin: Skin is warm and dry. She is not diaphoretic.  Psychiatric: She has a normal mood  and affect. Her behavior is normal. Judgment and thought content normal.  Affect improved from last visit.      Assessment & Plan:   Please see problem oriented charting.

## 2013-11-23 NOTE — Assessment & Plan Note (Signed)
She has done well postoperatively from her left femoropopliteal graft thrombectomy. We are continuing lifelong anticoagulation and she follows closely with Dr. Elie Confer in the anticoagulation clinic.

## 2013-11-23 NOTE — Assessment & Plan Note (Signed)
Her weight has increased 15 pounds with slight improvement in her major depression. We will continue aggressive management of her depression with medication and counseling in hopes of further improving her appetite, and thus her mild protein calorie malnutrition.

## 2013-11-23 NOTE — Assessment & Plan Note (Signed)
Her blood pressure was slightly above target today, even upon repeat testing. This is unusual for her when looking back at her most recent blood pressure readings. Since this may be isolated, we will continue her current lisinopril at 20 mg by mouth daily and Maxide 37.5-25 mg by mouth daily and reassess the blood pressure at the followup visit. If still above target we will likely increase the lisinopril to 40 mg by mouth daily.

## 2013-11-23 NOTE — Assessment & Plan Note (Signed)
Unfortunately, she has restarted smoking. She states it's difficult with her depression. She can stop for several days but starts to get the craving as well. She still is very much interested in stopping her smoking given her underlying peripheral vascular occlusive disease. She was open to trying a nicotine patch. We therefore wrote for the 21, 14, and 7 mg per 24 hour doses for one month each and we will reassess her success in smoking cessation at the followup visit.

## 2013-11-23 NOTE — Assessment & Plan Note (Signed)
Kristin Knight' appetite has improved and she has put on 15 pounds since the last visit. Nonetheless, she still is anhedonic and socially isolated outside of her church. She missed her last appointment with her counselor, who she feels is helping, and will reschedule with them. We decided to increase the Prozac to 40 mg by mouth daily to see if this helps improve her anhedonia any further. We will reassess at the followup visit.

## 2013-11-23 NOTE — Assessment & Plan Note (Signed)
Her blood pressure is slightly above target today. Nonetheless, this may be an isolated reading. We will reassess at the followup visit and continue her current regimen. She also continues to take her pravastatin, and this was refilled. When last checked approximately 3 months ago her LDL was well within the target range. The biggest issue to tackle is her tobacco use. As noted above, she is interested in quitting and has been provided nicotine replacement therapy in hopes of facilitating this goal of hers.

## 2013-11-23 NOTE — Patient Instructions (Signed)
It was great to see you again.  I am glad you have improved from your surgery.  1) Great job on the weight gain.  15 pounds is wonderful.  2) We increased your Prozac to 40 mg by mouth daily (doubled the dose).  I hope this will help your depression.  3) Call you counselor to reschedule your appointment with them when you have time.  4) I prescribed nicotine patches to help you quit smoking.  Start with the 28 mg/24 hours patches each day.  When done (4 weeks) start the 14 mg/24 hours patches each day.  When done (4 weeks) start the 7 mg/24 hours patches each day until they are completed.  5) I prescribed high dose tylenol for your upper back and shoulder pain.  Take 1000 mg of acetaminophen (another name for tylenol) up to every 6 hours as needed.  Do not take more than 4 times a day.  6) I will call you with the results of the spine X-ray if it may help [place] figure out other ways to treat the pain.  7) I renewed your pravastatin (cholesterol medication).  I will see you back in 3 months, sooner if necessary.

## 2013-11-23 NOTE — Assessment & Plan Note (Signed)
She did not fill her Zostavax prescription secondary to costs. She is investigating whether or not it may be covered by Medicaid. If so, she will consider obtaining it. Otherwise, the cost of nearly $200 is prohibitive for her at this time. She is otherwise up to date with her preventative health care.

## 2013-11-23 NOTE — Assessment & Plan Note (Signed)
Her neck and shoulder pain did not improve with the tramadol, just as it did not improve with the muscle relaxant. This was therefore stopped. We would like to avoid NSAIDs given the need for chronic anticoagulation. We therefore decided to start Tylenol 1000 mg by mouth every 6 hours as needed for pain. She was advised never to go above 4000 mg a day. We also obtain a thoracic spine x-ray to assess for possible compression fractures as a potential explanation for her pain. Other than diffuse osteopenia there were no abnormalities. We will reassess her pain control on the high dose Tylenol at the followup visit.

## 2013-12-10 ENCOUNTER — Ambulatory Visit (INDEPENDENT_AMBULATORY_CARE_PROVIDER_SITE_OTHER): Payer: Medicare Other | Admitting: Pharmacist

## 2013-12-10 DIAGNOSIS — Z7901 Long term (current) use of anticoagulants: Secondary | ICD-10-CM | POA: Diagnosis not present

## 2013-12-10 DIAGNOSIS — I743 Embolism and thrombosis of arteries of the lower extremities: Secondary | ICD-10-CM | POA: Diagnosis not present

## 2013-12-10 DIAGNOSIS — I779 Disorder of arteries and arterioles, unspecified: Secondary | ICD-10-CM

## 2013-12-10 LAB — POCT INR: INR: 3.3

## 2013-12-11 NOTE — Progress Notes (Signed)
Anti-Coagulation Progress Note  Kristin Knight is a 68 y.o. female who is currently on an anti-coagulation regimen.    RECENT RESULTS: Recent results are below, the most recent result is correlated with a dose of 36 mg. per week: Lab Results  Component Value Date   INR 3.30 12/10/2013   INR 2.30 11/12/2013   INR 2.3 10/22/2013    ANTI-COAG DOSE: Anticoagulation Dose Instructions as of 12/10/2013     [name] Tue Wed Thu Fri Sat   New Dose 4 mg 4 mg 4 mg 6 mg 4 mg 4 mg 6 mg       ANTICOAG SUMMARY: Anticoagulation Episode Summary   Current INR goal 2.0-3.0  Next INR check 12/17/2013  INR from last check 3.30! (12/10/2013)  Weekly max dose   Target end date   [facility] check location [facility]  Preferred lab   Send INR reminders to    Indications  Peripheral arterial occlusive disease [444.22] Long term (current) use of anticoagulants [V58.61]        Comments         ANTICOAG TODAY: Anticoagulation Summary as of 12/10/2013   INR goal 2.0-3.0  Selected INR 3.30! (12/10/2013)  Next INR check 12/17/2013  Target end date    Indications  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  Patient instructed to take today's dose.  Patient verbalized understanding of these instructions.       FOLLOW-UP Return in 7 days (on 12/17/2013) for Follow up INR at [place], III Pharm.D., CACP

## 2013-12-11 NOTE — Patient Instructions (Signed)
Patient instructed to take medications as defined in the Anti-coagulation Track section of this encounter.  Patient instructed to take today's dose.  Patient verbalized understanding of these instructions.    

## 2013-12-17 ENCOUNTER — Ambulatory Visit (INDEPENDENT_AMBULATORY_CARE_PROVIDER_SITE_OTHER): Payer: Medicare Other | Admitting: Pharmacist

## 2013-12-17 DIAGNOSIS — I779 Disorder of arteries and arterioles, unspecified: Secondary | ICD-10-CM

## 2013-12-17 DIAGNOSIS — Z7901 Long term (current) use of anticoagulants: Secondary | ICD-10-CM

## 2013-12-17 DIAGNOSIS — I743 Embolism and thrombosis of arteries of the lower extremities: Secondary | ICD-10-CM | POA: Diagnosis not present

## 2013-12-17 LAB — POCT INR: INR: 2.6

## 2013-12-17 NOTE — Patient Instructions (Signed)
Patient instructed to take medications as defined in the Anti-coagulation Track section of this encounter.  Patient instructed to take today's dose.  Patient verbalized understanding of these instructions.    

## 2013-12-17 NOTE — Progress Notes (Signed)
Anti-Coagulation Progress Note  Kristin Knight is a 67 y.o. female who is currently on an anti-coagulation regimen.    RECENT RESULTS: Recent results are below, the most recent result is correlated with a dose of 30 mg. per week: Lab Results  Component Value Date   INR 2.60 12/17/2013   INR 3.30 12/10/2013   INR 2.30 11/12/2013    ANTI-COAG DOSE: Anticoagulation Dose Instructions as of 12/17/2013     [name] Tue Wed Thu Fri Sat   New Dose 4 mg 4 mg 4 mg 6 mg 4 mg 4 mg 6 mg       ANTICOAG SUMMARY: Anticoagulation Episode Summary   Current INR goal 2.0-3.0  Next INR check 12/31/2013  INR from last check 2.60 (12/17/2013)  Weekly max dose   Target end date   [facility] check [facility]  Preferred lab   Send INR reminders to    Indications  Peripheral arterial occlusive disease [444.22] Long term (current) use of anticoagulants [V58.61]        Comments         ANTICOAG TODAY: Anticoagulation Summary as of 12/17/2013   INR goal 2.0-3.0  Selected INR 2.60 (12/17/2013)  Next INR check 12/31/2013  Target end date    Indications  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  Patient instructed to take today's dose.  Patient verbalized understanding of these instructions.       FOLLOW-UP Return for Follow up INR 3PM.  Jorene Guest, III Pharm.D., CACP

## 2013-12-31 ENCOUNTER — Ambulatory Visit: Payer: Self-pay

## 2014-01-21 ENCOUNTER — Ambulatory Visit (INDEPENDENT_AMBULATORY_CARE_PROVIDER_SITE_OTHER): Payer: Medicare Other | Admitting: Pharmacist

## 2014-01-21 DIAGNOSIS — I779 Disorder of arteries and arterioles, unspecified: Secondary | ICD-10-CM

## 2014-01-21 DIAGNOSIS — I743 Embolism and thrombosis of arteries of the lower extremities: Secondary | ICD-10-CM

## 2014-01-21 DIAGNOSIS — Z7901 Long term (current) use of anticoagulants: Secondary | ICD-10-CM | POA: Diagnosis not present

## 2014-01-22 LAB — POCT INR: INR: 2.1

## 2014-01-22 NOTE — Patient Instructions (Signed)
Patient instructed to take medications as defined in the Anti-coagulation Track section of this encounter.  Patient instructed to take today's dose.  Patient verbalized understanding of these instructions.    

## 2014-01-22 NOTE — Progress Notes (Signed)
Anti-Coagulation Progress Note  Kristin Knight is a 68 y.o. female who is currently on an anti-coagulation regimen.    RECENT RESULTS: Recent results are below, the most recent result is correlated with a dose of 32 mg. per week: Lab Results  Component Value Date   INR 2.10 01/22/2014   INR 2.60 12/17/2013   INR 3.30 12/10/2013    ANTI-COAG DOSE: Anticoagulation Dose Instructions as of 01/21/2014     [name] Tue Wed Thu Fri Sat   New Dose 4 mg 6 mg 4 mg 6 mg 4 mg 6 mg 4 mg       ANTICOAG SUMMARY: Anticoagulation Episode Summary   Current INR goal 2.0-3.0  Next INR check 02/18/2014  INR from last check 2.60 (12/17/2013)  Most recent INR 2.10 (01/22/2014)  Weekly max dose   Target end date   [facility] check location [facility]  Preferred lab   Send INR reminders to    Indications  Peripheral arterial occlusive disease [444.22] Long term (current) use of anticoagulants [V58.61]        Comments         ANTICOAG TODAY: Anticoagulation Summary as of 01/21/2014   INR goal 2.0-3.0  Selected INR 2.60 (12/17/2013)  Next INR check 02/18/2014  Target end date    Indications  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  Patient instructed to take today's dose.  Patient verbalized understanding of these instructions.       FOLLOW-UP Return in 4 weeks (on 02/18/2014) for Follow up INR at 3:15pm.  Jorene Guest, III Pharm.D., CACP

## 2014-02-04 ENCOUNTER — Other Ambulatory Visit: Payer: Self-pay | Admitting: Internal Medicine

## 2014-02-04 DIAGNOSIS — T82868A Thrombosis of vascular prosthetic devices, implants and grafts, initial encounter: Secondary | ICD-10-CM

## 2014-02-15 ENCOUNTER — Encounter (HOSPITAL_COMMUNITY): Payer: Self-pay | Admitting: Emergency Medicine

## 2014-02-15 ENCOUNTER — Inpatient Hospital Stay (HOSPITAL_COMMUNITY)
Admission: EM | Admit: 2014-02-15 | Discharge: 2014-02-21 | DRG: 237 | Disposition: A | Discharge status: Home / Self Care | Payer: Medicare Other | Attending: Vascular Surgery | Admitting: Vascular Surgery

## 2014-02-15 DIAGNOSIS — E43 Unspecified severe protein-calorie malnutrition: Secondary | ICD-10-CM | POA: Diagnosis present

## 2014-02-15 DIAGNOSIS — Z880 Allergy status to penicillin: Secondary | ICD-10-CM

## 2014-02-15 DIAGNOSIS — F17200 Nicotine dependence, unspecified, uncomplicated: Secondary | ICD-10-CM | POA: Diagnosis present

## 2014-02-15 DIAGNOSIS — Y832 Surgical operation with anastomosis, bypass or graft as the cause of abnormal reaction of the patient, or of later complication, without mention of misadventure at the time of the procedure: Secondary | ICD-10-CM | POA: Diagnosis present

## 2014-02-15 DIAGNOSIS — Z803 Family history of malignant neoplasm of breast: Secondary | ICD-10-CM

## 2014-02-15 DIAGNOSIS — M79609 Pain in unspecified limb: Secondary | ICD-10-CM | POA: Diagnosis not present

## 2014-02-15 DIAGNOSIS — I739 Peripheral vascular disease, unspecified: Secondary | ICD-10-CM | POA: Diagnosis not present

## 2014-02-15 DIAGNOSIS — I7092 Chronic total occlusion of artery of the extremities: Secondary | ICD-10-CM | POA: Diagnosis present

## 2014-02-15 DIAGNOSIS — M79605 Pain in left leg: Secondary | ICD-10-CM

## 2014-02-15 DIAGNOSIS — F1011 Alcohol abuse, in remission: Secondary | ICD-10-CM | POA: Diagnosis present

## 2014-02-15 DIAGNOSIS — I498 Other specified cardiac arrhythmias: Secondary | ICD-10-CM | POA: Diagnosis present

## 2014-02-15 DIAGNOSIS — I10 Essential (primary) hypertension: Secondary | ICD-10-CM | POA: Diagnosis not present

## 2014-02-15 DIAGNOSIS — Z888 Allergy status to other drugs, medicaments and biological substances status: Secondary | ICD-10-CM

## 2014-02-15 DIAGNOSIS — Z800 Family history of malignant neoplasm of digestive organs: Secondary | ICD-10-CM

## 2014-02-15 DIAGNOSIS — T82898A Other specified complication of vascular prosthetic devices, implants and grafts, initial encounter: Principal | ICD-10-CM | POA: Diagnosis present

## 2014-02-15 DIAGNOSIS — Z7901 Long term (current) use of anticoagulants: Secondary | ICD-10-CM

## 2014-02-15 DIAGNOSIS — Z681 Body mass index (BMI) 19 or less, adult: Secondary | ICD-10-CM

## 2014-02-15 DIAGNOSIS — Z8249 Family history of ischemic heart disease and other diseases of the circulatory system: Secondary | ICD-10-CM

## 2014-02-15 DIAGNOSIS — R6889 Other general symptoms and signs: Secondary | ICD-10-CM

## 2014-02-15 LAB — CBC WITH DIFFERENTIAL/PLATELET
Basophils Absolute: 0 10*3/uL (ref 0.0–0.1)
Basophils Relative: 0 % (ref 0–1)
EOS ABS: 0.1 10*3/uL (ref 0.0–0.7)
EOS PCT: 2 % (ref 0–5)
HCT: 36 % (ref 36.0–46.0)
HEMOGLOBIN: 11.6 g/dL — AB (ref 12.0–15.0)
LYMPHS ABS: 1.8 10*3/uL (ref 0.7–4.0)
Lymphocytes Relative: 51 % — ABNORMAL HIGH (ref 12–46)
MCH: 28.3 pg (ref 26.0–34.0)
MCHC: 32.2 g/dL (ref 30.0–36.0)
MCV: 87.8 fL (ref 78.0–100.0)
MONOS PCT: 10 % (ref 3–12)
Monocytes Absolute: 0.3 10*3/uL (ref 0.1–1.0)
Neutro Abs: 1.3 10*3/uL — ABNORMAL LOW (ref 1.7–7.7)
Neutrophils Relative %: 37 % — ABNORMAL LOW (ref 43–77)
Platelets: 151 10*3/uL (ref 150–400)
RBC: 4.1 MIL/uL (ref 3.87–5.11)
RDW: 14.9 % (ref 11.5–15.5)
WBC: 3.6 10*3/uL — ABNORMAL LOW (ref 4.0–10.5)

## 2014-02-15 LAB — BASIC METABOLIC PANEL
BUN: 17 mg/dL (ref 6–23)
CALCIUM: 9.4 mg/dL (ref 8.4–10.5)
CO2: 27 mEq/L (ref 19–32)
Chloride: 97 mEq/L (ref 96–112)
Creatinine, Ser: 0.88 mg/dL (ref 0.50–1.10)
GFR calc Af Amer: 77 mL/min — ABNORMAL LOW (ref >90)
GFR, EST NON AFRICAN AMERICAN: 66 mL/min — AB (ref >90)
GLUCOSE: 87 mg/dL (ref 70–99)
Potassium: 3.5 mEq/L — ABNORMAL LOW (ref 3.7–5.3)
Sodium: 137 mEq/L (ref 137–147)

## 2014-02-15 LAB — COMPREHENSIVE METABOLIC PANEL
ALT: 7 U/L (ref 0–35)
AST: 15 U/L (ref 0–37)
Albumin: 3.4 g/dL — ABNORMAL LOW (ref 3.5–5.2)
Alkaline Phosphatase: 45 U/L (ref 39–117)
BILIRUBIN TOTAL: 0.2 mg/dL — AB (ref 0.3–1.2)
BUN: 13 mg/dL (ref 6–23)
CHLORIDE: 100 meq/L (ref 96–112)
CO2: 28 meq/L (ref 19–32)
Calcium: 9 mg/dL (ref 8.4–10.5)
Creatinine, Ser: 0.87 mg/dL (ref 0.50–1.10)
GFR calc Af Amer: 78 mL/min — ABNORMAL LOW (ref >90)
GFR calc non Af Amer: 67 mL/min — ABNORMAL LOW (ref >90)
Glucose, Bld: 144 mg/dL — ABNORMAL HIGH (ref 70–99)
POTASSIUM: 3.8 meq/L (ref 3.7–5.3)
SODIUM: 140 meq/L (ref 137–147)
Total Protein: 6.8 g/dL (ref 6.0–8.3)

## 2014-02-15 LAB — URINALYSIS, ROUTINE W REFLEX MICROSCOPIC
Bilirubin Urine: NEGATIVE
Glucose, UA: NEGATIVE mg/dL
Hgb urine dipstick: NEGATIVE
Ketones, ur: NEGATIVE mg/dL
Leukocytes, UA: NEGATIVE
Nitrite: NEGATIVE
PROTEIN: NEGATIVE mg/dL
SPECIFIC GRAVITY, URINE: 1.009 (ref 1.005–1.030)
UROBILINOGEN UA: 0.2 mg/dL (ref 0.0–1.0)
pH: 7 (ref 5.0–8.0)

## 2014-02-15 LAB — CBC
HCT: 35.5 % — ABNORMAL LOW (ref 36.0–46.0)
Hemoglobin: 11.5 g/dL — ABNORMAL LOW (ref 12.0–15.0)
MCH: 28.6 pg (ref 26.0–34.0)
MCHC: 32.4 g/dL (ref 30.0–36.0)
MCV: 88.3 fL (ref 78.0–100.0)
PLATELETS: 145 10*3/uL — AB (ref 150–400)
RBC: 4.02 MIL/uL (ref 3.87–5.11)
RDW: 14.8 % (ref 11.5–15.5)
WBC: 3.7 10*3/uL — AB (ref 4.0–10.5)

## 2014-02-15 LAB — PROTIME-INR
INR: 2.03 — AB (ref 0.00–1.49)
INR: 2 — ABNORMAL HIGH (ref 0.00–1.49)
Prothrombin Time: 22.3 seconds — ABNORMAL HIGH (ref 11.6–15.2)
Prothrombin Time: 22.1 seconds — ABNORMAL HIGH (ref 11.6–15.2)

## 2014-02-15 MED ORDER — TRIAMTERENE-HCTZ 37.5-25 MG PO TABS
1.0000 | ORAL_TABLET | Freq: Every day | ORAL | Status: DC
  Administered 2014-02-17 – 2014-02-21 (×5): 1 via ORAL
  Filled 2014-02-15 (×7): qty 1

## 2014-02-15 MED ORDER — ONDANSETRON HCL 4 MG/2ML IJ SOLN
4.0000 mg | Freq: Once | INTRAMUSCULAR | Status: AC
  Administered 2014-02-15: 4 mg via INTRAVENOUS
  Filled 2014-02-15: qty 2

## 2014-02-15 MED ORDER — PHENOL 1.4 % MT LIQD
1.0000 | OROMUCOSAL | Status: DC | PRN
  Filled 2014-02-15: qty 177

## 2014-02-15 MED ORDER — SIMVASTATIN 10 MG PO TABS
10.0000 mg | ORAL_TABLET | Freq: Every day | ORAL | Status: DC
  Administered 2014-02-16 – 2014-02-20 (×5): 10 mg via ORAL
  Filled 2014-02-15 (×7): qty 1

## 2014-02-15 MED ORDER — ALUM & MAG HYDROXIDE-SIMETH 200-200-20 MG/5ML PO SUSP
15.0000 mL | ORAL | Status: DC | PRN
  Filled 2014-02-15: qty 30

## 2014-02-15 MED ORDER — POTASSIUM CHLORIDE CRYS ER 20 MEQ PO TBCR
20.0000 meq | EXTENDED_RELEASE_TABLET | Freq: Once | ORAL | Status: AC
  Administered 2014-02-15: 20 meq via ORAL
  Filled 2014-02-15: qty 1

## 2014-02-15 MED ORDER — METOPROLOL TARTRATE 1 MG/ML IV SOLN: 2.0000 mg | INTRAVENOUS | Status: DC | PRN

## 2014-02-15 MED ORDER — HYDRALAZINE HCL 20 MG/ML IJ SOLN: 10.0000 mg | INTRAMUSCULAR | Status: DC | PRN

## 2014-02-15 MED ORDER — LISINOPRIL 20 MG PO TABS
20.0000 mg | ORAL_TABLET | Freq: Every day | ORAL | Status: DC
  Administered 2014-02-17 – 2014-02-21 (×5): 20 mg via ORAL
  Filled 2014-02-15 (×7): qty 1

## 2014-02-15 MED ORDER — PANTOPRAZOLE SODIUM 40 MG PO TBEC
40.0000 mg | DELAYED_RELEASE_TABLET | Freq: Every day | ORAL | Status: DC
  Administered 2014-02-15 – 2014-02-21 (×6): 40 mg via ORAL
  Filled 2014-02-15 (×6): qty 1

## 2014-02-15 MED ORDER — ONDANSETRON HCL 4 MG/2ML IJ SOLN: 4.0000 mg | Freq: Four times a day (QID) | INTRAMUSCULAR | Status: DC | PRN

## 2014-02-15 MED ORDER — LABETALOL HCL 5 MG/ML IV SOLN
10.0000 mg | INTRAVENOUS | Status: DC | PRN
  Filled 2014-02-15: qty 4

## 2014-02-15 MED ORDER — KCL IN DEXTROSE-NACL 20-5-0.45 MEQ/L-%-% IV SOLN
INTRAVENOUS | Status: DC
  Administered 2014-02-15 – 2014-02-17 (×3): via INTRAVENOUS
  Filled 2014-02-15 (×6): qty 1000

## 2014-02-15 MED ORDER — OXYCODONE-ACETAMINOPHEN 5-325 MG PO TABS
1.0000 | ORAL_TABLET | ORAL | Status: DC | PRN
  Administered 2014-02-15 – 2014-02-19 (×4): 1 via ORAL
  Administered 2014-02-19: 2 via ORAL
  Administered 2014-02-19 (×2): 1 via ORAL
  Administered 2014-02-20 – 2014-02-21 (×2): 2 via ORAL
  Filled 2014-02-15: qty 1
  Filled 2014-02-15: qty 2
  Filled 2014-02-15 (×2): qty 1
  Filled 2014-02-15 (×2): qty 2
  Filled 2014-02-15 (×3): qty 1

## 2014-02-15 MED ORDER — MORPHINE SULFATE 4 MG/ML IJ SOLN
4.0000 mg | Freq: Once | INTRAMUSCULAR | Status: AC
  Administered 2014-02-15: 4 mg via INTRAVENOUS
  Filled 2014-02-15: qty 1

## 2014-02-15 MED ORDER — GUAIFENESIN-DM 100-10 MG/5ML PO SYRP
15.0000 mL | ORAL_SOLUTION | ORAL | Status: DC | PRN
  Filled 2014-02-15: qty 15

## 2014-02-15 MED ORDER — MORPHINE SULFATE 2 MG/ML IJ SOLN
1.0000 mg | INTRAMUSCULAR | Status: DC | PRN
Start: 2014-02-15 — End: 2014-02-16
  Administered 2014-02-16: 2 mg via INTRAVENOUS

## 2014-02-15 NOTE — ED Notes (Signed)
Vascular surgeon at bedside.
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
## 2014-02-15 NOTE — ED Provider Notes (Deleted)
Medical screening examination/treatment/procedure(s) were performed by non-physician practitioner and as supervising physician I was immediately available for consultation/collaboration.  Shahana Capes L Piper [place], MD 02/15/14 1601 

## 2014-02-15 NOTE — ED Notes (Signed)
Pt c/o L leg pain about 1 hour ago. States it feels just like when she had a dvt last year. Pain increases with ambulation

## 2014-02-15 NOTE — ED Provider Notes (Signed)
  Face-to-face evaluation   History: She complains of left leg pain that started today, while walking  Physical exam: Left leg: Warm to touch. Sensate. No pain with passive movement of toes. Biphasic pulse posterior tibial, with Doppler. Absent dorsalis pedis pulse with Doppler.  Medical screening examination/treatment/procedure(s) were conducted as a shared visit with non-physician practitioner(s) and myself.  I personally evaluated the patient during the encounter  Richarda Blade, MD 02/21/14 [phone]
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
## 2014-02-16 ENCOUNTER — Inpatient Hospital Stay (HOSPITAL_COMMUNITY): Payer: Medicare Other

## 2014-02-16 DIAGNOSIS — I7092 Chronic total occlusion of artery of the extremities: Secondary | ICD-10-CM | POA: Diagnosis not present

## 2014-02-16 LAB — CBC
HEMATOCRIT: 37.3 % (ref 36.0–46.0)
HEMATOCRIT: 37 % (ref 36.0–46.0)
Hemoglobin: 12 g/dL (ref 12.0–15.0)
Hemoglobin: 11.8 g/dL — ABNORMAL LOW (ref 12.0–15.0)
MCH: 28.6 pg (ref 26.0–34.0)
MCH: 28.3 pg (ref 26.0–34.0)
MCHC: 32.2 g/dL (ref 30.0–36.0)
MCHC: 31.9 g/dL (ref 30.0–36.0)
MCV: 88.8 fL (ref 78.0–100.0)
MCV: 88.7 fL (ref 78.0–100.0)
Platelets: 125 10*3/uL — ABNORMAL LOW (ref 150–400)
Platelets: 131 10*3/uL — ABNORMAL LOW (ref 150–400)
RBC: 4.2 MIL/uL (ref 3.87–5.11)
RBC: 4.17 MIL/uL (ref 3.87–5.11)
RDW: 14.7 % (ref 11.5–15.5)
RDW: 14.6 % (ref 11.5–15.5)
WBC: 4.5 10*3/uL (ref 4.0–10.5)
WBC: 5.8 10*3/uL (ref 4.0–10.5)

## 2014-02-16 LAB — MRSA PCR SCREENING: MRSA by PCR: NEGATIVE

## 2014-02-16 LAB — HEPARIN LEVEL (UNFRACTIONATED)
HEPARIN UNFRACTIONATED: 0.37 [IU]/mL (ref 0.30–0.70)
Heparin Unfractionated: 0.7 IU/mL (ref 0.30–0.70)

## 2014-02-16 LAB — GLUCOSE, CAPILLARY
GLUCOSE-CAPILLARY: 104 mg/dL — AB (ref 70–99)
Glucose-Capillary: 152 mg/dL — ABNORMAL HIGH (ref 70–99)
Glucose-Capillary: 92 mg/dL (ref 70–99)

## 2014-02-16 LAB — PROTIME-INR
INR: 1.84 — AB (ref 0.00–1.49)
PROTHROMBIN TIME: 20.7 s — AB (ref 11.6–15.2)

## 2014-02-16 LAB — FIBRINOGEN
Fibrinogen: 315 mg/dL (ref 204–475)
Fibrinogen: 277 mg/dL (ref 204–475)

## 2014-02-16 MED ORDER — SODIUM CHLORIDE 0.9 % IV SOLN
250.0000 mL | INTRAVENOUS | Status: DC | PRN
  Administered 2014-02-16: via INTRAVENOUS
  Administered 2014-02-20: 250 mL via INTRAVENOUS

## 2014-02-16 MED ORDER — TENECTEPLASE 50 MG IV KIT
0.5000 mg/h | PACK | INTRAVENOUS | Status: DC
  Administered 2014-02-16: 0.4 mg/h via INTRAVENOUS
  Filled 2014-02-16: qty 1

## 2014-02-16 MED ORDER — IODIXANOL 320 MG/ML IV SOLN
100.0000 mL | Freq: Once | INTRAVENOUS | Status: AC | PRN
  Administered 2014-02-16: 50 mL via INTRAVENOUS

## 2014-02-16 MED ORDER — FENTANYL CITRATE 0.05 MG/ML IJ SOLN
INTRAMUSCULAR | Status: AC | PRN
  Administered 2014-02-16: 25 ug via INTRAVENOUS
  Administered 2014-02-16: 50 ug via INTRAVENOUS
  Administered 2014-02-16: 25 ug via INTRAVENOUS

## 2014-02-16 MED ORDER — TENECTEPLASE 50 MG IV KIT
0.4000 mg/h | PACK | INTRAVENOUS | Status: DC
  Administered 2014-02-16 – 2014-02-17 (×2): 0.4 mg/h
  Filled 2014-02-16 (×2): qty 0.5

## 2014-02-16 MED ORDER — HEPARIN (PORCINE) IN NACL 100-0.45 UNIT/ML-% IJ SOLN
350.0000 [IU]/h | INTRAMUSCULAR | Status: DC
  Administered 2014-02-16: 400 [IU]/h via INTRAVENOUS
  Filled 2014-02-16 (×2): qty 250

## 2014-02-16 MED ORDER — FENTANYL CITRATE 0.05 MG/ML IJ SOLN
INTRAMUSCULAR | Status: AC
  Filled 2014-02-16: qty 4

## 2014-02-16 MED ORDER — SODIUM CHLORIDE 0.9 % IJ SOLN: 3.0000 mL | INTRAMUSCULAR | Status: DC | PRN

## 2014-02-16 MED ORDER — MIDAZOLAM HCL 2 MG/2ML IJ SOLN
INTRAMUSCULAR | Status: AC
  Filled 2014-02-16: qty 4

## 2014-02-16 MED ORDER — MORPHINE SULFATE 2 MG/ML IJ SOLN
2.0000 mg | INTRAMUSCULAR | Status: DC | PRN
  Administered 2014-02-16: 2 mg via INTRAVENOUS
  Administered 2014-02-16: 4 mg via INTRAVENOUS
  Administered 2014-02-16 – 2014-02-17 (×2): 2 mg via INTRAVENOUS
  Administered 2014-02-17: 4 mg via INTRAVENOUS
  Administered 2014-02-17: 2 mg via INTRAVENOUS
  Administered 2014-02-17: 4 mg via INTRAVENOUS
  Filled 2014-02-16: qty 1
  Filled 2014-02-16: qty 2
  Filled 2014-02-16 (×2): qty 1
  Filled 2014-02-16 (×2): qty 2

## 2014-02-16 MED ORDER — SODIUM CHLORIDE 0.9 % IJ SOLN
3.0000 mL | Freq: Two times a day (BID) | INTRAMUSCULAR | Status: DC
  Administered 2014-02-16 – 2014-02-19 (×4): 3 mL via INTRAVENOUS

## 2014-02-16 MED ORDER — MORPHINE SULFATE 4 MG/ML IJ SOLN
INTRAMUSCULAR | Status: AC
  Filled 2014-02-16: qty 1

## 2014-02-16 MED ORDER — HEPARIN (PORCINE) IN NACL 100-0.45 UNIT/ML-% IJ SOLN: 800.0000 [IU]/h | INTRAMUSCULAR | Status: DC

## 2014-02-16 MED ORDER — MIDAZOLAM HCL 2 MG/2ML IJ SOLN
INTRAMUSCULAR | Status: AC | PRN
  Administered 2014-02-16: 2 mg via INTRAVENOUS
  Administered 2014-02-16: 1 mg via INTRAVENOUS

## 2014-02-16 NOTE — Procedures (Signed)
Successful LLE ANGIO W/ INSERTION OF INFUSION CATH THROUGH OCCLUDED LT FEM POP BYPASS BEGIN TNK LYSIS AT 0.4MG /HR NO COMP STABLE FULL REPORT IN PACS FOLOW UP ANGIO TOMORROW AM
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
## 2014-02-16 NOTE — Progress Notes (Signed)
RN called Dr. Donnetta Hutching regarding bradycardia (HR as low as 44) and oozing around sheath. Mbr alert and oriented. BP stable. MD gave no further orders. Continue to monitor and notify IR if oozing becomes worse and/or pt becomes symptomatic.

## 2014-02-16 NOTE — Progress Notes (Signed)
Mbr has the following belongings from home: two rings- 1.gold in color with clear stone 2. Silver with clear stone, watch with brown band, purse, black cell phone, clothes, and wig. Belongings at bedside at this time.
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
## 2014-02-16 NOTE — Progress Notes (Signed)
Subjective: Interval History: Just back from interventional radiology with  Objective: Vital signs in last 24 hours: Temp:  [97.8 F (36.6 C)-98.4 F (36.9 C)] 98.4 F (36.9 C) (04/04 0453) Pulse Rate:  [55-78] 55 (04/04 1012) Resp:  [12-20] 16 (04/04 1012) BP: (82-146)/(50-89) 109/61 mmHg (04/04 1012) SpO2:  [94 %-100 %] 100 % (04/04 1012) Weight:  [90 lb (40.824 kg)] 90 lb (40.824 kg) (04/03 1237)  Intake/Output from previous day: 04/03 0701 - 04/04 0700 In: 240 [P.O.:240] Out: -  Intake/Output this shift:    No palpable pedal pulse on the left. She then placed on the right.  Lab Results:  Recent Labs  02/15/14 1400 02/15/14 2015  WBC 3.6* 3.7*  HGB 11.6* 11.5*  HCT 36.0 35.5*  PLT 151 145*   BMET  Recent Labs  02/15/14 1400 02/15/14 2015  NA 137 140  K 3.5* 3.8  CL 97 100  CO2 27 28  GLUCOSE 87 144*  BUN 17 13  CREATININE 0.88 0.87  CALCIUM 9.4 9.0    Studies/Results: No results found. Anti-infectives: Anti-infectives   None      Assessment/Plan: s/p * No surgery found * the arterial is reviewed. This shows some disease in the popliteal below the graft. This could tibial vessel runoff. Appreciate admission radiology is care. 4 lysis overnight and repeat studies in the morning.   LOS: 1 day    [place] 02/16/2014, 11:12 AM

## 2014-02-16 NOTE — Progress Notes (Signed)
Pt attempted to void with no results. Bladder scan preformed. Residual 74cc. Pt states no discomfort at this time.
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
## 2014-02-16 NOTE — Progress Notes (Signed)
ANTICOAGULATION CONSULT NOTE - Initial Consult  Pharmacy Consult for Heparin Indication: DVT  Allergies  Allergen Reactions  . Meperidine Hcl Other (See Comments)    nevousness  . Penicillins Other (See Comments)    Patient passed out    Patient Measurements: Height: 5\' 4"  (162.6 cm) Weight: 90 lb (40.824 kg) IBW/kg (Calculated) : 54.7 Heparin Dosing Weight: 40.8 kg  Vital Signs: Temp: 97.8 F (36.6 C) (04/04 1547) Temp src: Oral (04/04 1547) BP: 139/84 mmHg (04/04 1800) Pulse Rate: 51 (04/04 1800)  Labs:  Recent Labs  02/15/14 1400 02/15/14 2015 02/16/14 1255 02/16/14 1700  HGB 11.6* 11.5* 12.0 11.8*  HCT 36.0 35.5* 37.3 37.0  PLT 151 145* 125* 131*  LABPROT 22.3* 22.1* 20.7*  --   INR 2.03* 2.00* 1.84*  --   HEPARINUNFRC  --   --  0.37 0.70  CREATININE 0.88 0.87  --   --     Estimated Creatinine Clearance: 40.4 ml/min (by C-G formula based on Cr of 0.87).  Assessment: 44 YOF s/p TNK lysis of LLE fem pop bypass occlusion to continue on TNK + IV heparin until re-evaluation with ANGIO on 4/5. TNK running at 0.4mg /hr. H/H ok, Platelets 145. There is oozing at the sheath site.  MD aware.  Heparin level 0.7 which is above the goal.  Goal of Therapy:  Heparin level 0.2 to 0.5 units/ml Monitor platelets by anticoagulation protocol: Yes   Plan:  Decrease Heparin to 350 units/hr Heparin level every 6 hours per protocol. Follow-up INR, Fibrinogen, and CBC.   Heide Guile, PharmD, BCPS Clinical Pharmacist Pager [phone]  02/16/2014,6:57 PM

## 2014-02-16 NOTE — Progress Notes (Signed)
Pt continues to ooze around sheath site despite having extra pressure applied by sand bag. RN spoke with [employer], [place]. Advised RN to page Dr. Annamaria Boots, [phone]. Dr. Annamaria Boots advised RN to continue to use sand bag and ok to remove current dsg to reapply a pressure dsg.

## 2014-02-17 ENCOUNTER — Inpatient Hospital Stay (HOSPITAL_COMMUNITY): Payer: Medicare Other

## 2014-02-17 DIAGNOSIS — I7092 Chronic total occlusion of artery of the extremities: Secondary | ICD-10-CM | POA: Diagnosis not present

## 2014-02-17 LAB — CBC
HCT: 31.8 % — ABNORMAL LOW (ref 36.0–46.0)
HCT: 34 % — ABNORMAL LOW (ref 36.0–46.0)
Hemoglobin: 10.2 g/dL — ABNORMAL LOW (ref 12.0–15.0)
Hemoglobin: 10.8 g/dL — ABNORMAL LOW (ref 12.0–15.0)
MCH: 28.1 pg (ref 26.0–34.0)
MCH: 28.3 pg (ref 26.0–34.0)
MCHC: 31.8 g/dL (ref 30.0–36.0)
MCHC: 32.1 g/dL (ref 30.0–36.0)
MCV: 88.3 fL (ref 78.0–100.0)
MCV: 88.3 fL (ref 78.0–100.0)
PLATELETS: 120 10*3/uL — AB (ref 150–400)
Platelets: 126 10*3/uL — ABNORMAL LOW (ref 150–400)
RBC: 3.6 MIL/uL — ABNORMAL LOW (ref 3.87–5.11)
RBC: 3.85 MIL/uL — ABNORMAL LOW (ref 3.87–5.11)
RDW: 14.7 % (ref 11.5–15.5)
RDW: 14.8 % (ref 11.5–15.5)
WBC: 4 10*3/uL (ref 4.0–10.5)
WBC: 4.7 10*3/uL (ref 4.0–10.5)

## 2014-02-17 LAB — HEPARIN LEVEL (UNFRACTIONATED)
Heparin Unfractionated: 0.39 IU/mL (ref 0.30–0.70)
Heparin Unfractionated: 0.8 IU/mL — ABNORMAL HIGH (ref 0.30–0.70)

## 2014-02-17 LAB — FIBRINOGEN
FIBRINOGEN: 238 mg/dL (ref 204–475)
FIBRINOGEN: 243 mg/dL (ref 204–475)

## 2014-02-17 MED ORDER — MIDAZOLAM HCL 2 MG/2ML IJ SOLN
INTRAMUSCULAR | Status: AC
  Filled 2014-02-17: qty 4

## 2014-02-17 MED ORDER — MORPHINE SULFATE 4 MG/ML IJ SOLN
INTRAMUSCULAR | Status: AC
  Administered 2014-02-17: 10:00:00
  Filled 2014-02-17: qty 1

## 2014-02-17 MED ORDER — IODIXANOL 320 MG/ML IV SOLN
100.0000 mL | Freq: Once | INTRAVENOUS | Status: AC | PRN
  Administered 2014-02-17: 80 mL via INTRAVENOUS

## 2014-02-17 MED ORDER — HEPARIN (PORCINE) IN NACL 100-0.45 UNIT/ML-% IJ SOLN: 600.0000 [IU]/h | INTRAMUSCULAR | Status: DC

## 2014-02-17 MED ORDER — HEPARIN (PORCINE) IN NACL 100-0.45 UNIT/ML-% IJ SOLN
600.0000 [IU]/h | INTRAMUSCULAR | Status: DC
  Administered 2014-02-17: 300 [IU]/h via INTRAVENOUS
  Administered 2014-02-18 – 2014-02-20 (×2): 600 [IU]/h via INTRAVENOUS
  Filled 2014-02-17 (×4): qty 250

## 2014-02-17 MED ORDER — HEPARIN (PORCINE) IN NACL 100-0.45 UNIT/ML-% IJ SOLN
300.0000 [IU]/h | INTRAMUSCULAR | Status: DC
  Filled 2014-02-17: qty 250

## 2014-02-17 NOTE — Progress Notes (Signed)
Subjective: Interval History: none.. comfortable overnight. Denies pain in her left foot. Did have slight oozing from right femoral sheath  Objective: Vital signs in last 24 hours: Temp:  [97.5 F (36.4 C)-98.7 F (37.1 C)] 98.2 F (36.8 C) (04/05 0758) Pulse Rate:  [50-72] 57 (04/05 0500) Resp:  [11-20] 15 (04/05 0500) BP: (108-156)/(61-102) 123/67 mmHg (04/05 0500) SpO2:  [98 %-100 %] 100 % (04/05 0500)  Intake/Output from previous day: 04/04 0701 - 04/05 0700 In: 2167.5 [P.O.:90; I.V.:1317.5; IV Piggyback:760] Out: 550 [Urine:550] Intake/Output this shift:    Left foot warm. I do not palpate pedal pulses but she has an easily palpable popliteal pulse. No hematoma from right groin site.  Lab Results:  Recent Labs  02/17/14 0005 02/17/14 0422  WBC 4.7 4.0  HGB 10.8* 10.2*  HCT 34.0* 31.8*  PLT 126* 120*   BMET  Recent Labs  02/15/14 1400 02/15/14 2015  NA 137 140  K 3.5* 3.8  CL 97 100  CO2 27 28  GLUCOSE 87 144*  BUN 17 13  CREATININE 0.88 0.87  CALCIUM 9.4 9.0    Studies/Results: Ir Angiogram Extremity Left  02/16/2014   CLINICAL DATA:  Peripheral vascular disease, left lower extremity femoral to above knee popliteal bypass recurrent occlusion, left leg pain  EXAM: ULTRASOUND GUIDANCE FOR VASCULAR ACCESS  LEFT LOWER EXTREMITY ANGIOGRAM  TRANSCATHETER ARTERIAL THROMBOLYSIS OF THE LEFT LOWER EXTREMITY BYPASS  Date:  4/4/20154/02/2014 10:40 AM  Radiologist:  Jerilynn Mages. Daryll Brod, MD  Guidance:  Ultrasound and fluoroscopic  FLUOROSCOPY TIME:  6 min 24 seconds  MEDICATIONS AND MEDICAL HISTORY: 3 mg Versed, 75 mcg fentanyl  ANESTHESIA/SEDATION: 45 min  CONTRAST:  46mL VISIPAQUE IODIXANOL 320 MG/ML IV SOLN  COMPLICATIONS: No immediate  PROCEDURE: Informed consent was obtained from the patient following explanation of the procedure, risks, benefits and alternatives. The patient understands, agrees and consents for the procedure. All questions were addressed. A time out was  performed.  Maximal barrier sterile technique utilized including caps, mask, sterile gowns, sterile gloves, large sterile drape, hand hygiene, and ChloraPrep.  Under sterile conditions and local anesthesia, right common femoral artery micropuncture access was performed with ultrasound. Images obtained for documentation. Guidewire advanced into the aorta. A 6 French 45 cm [facility] guide sheath was advanced to the bifurcation. Five [place] Sos catheter was utilized to select the bifurcation. A guidewire was advanced into the left external iliac artery. The catheter and sheath were advanced as a unit into the contralateral left external iliac artery.  Left lower extremity angiogram was performed.  Left lower extremity: Diffuse left iliac vascular disease noted. There is occlusion of the proximal native left SFA. Profunda femoral artery remains patent. There is occlusion of the left femoral bypass graft just below the femoral anastomosis. Harvie Junior Melida Gimenez catheter and Glidewire were utilized to access the proximal femoral anastomosis. Catheter and guidewire access advanced through the occluded femoral popliteal bypass peripherally. Access was advanced past the distal anastomosis. Contrast injection confirms position within the native above knee popliteal artery.  Below the bypass, the popliteal artery and bifurcation remains patent. There is preserved 2 vessel anterior tibial and posterior tibial runoff.  Rosen exchange guidewire advanced. A 135 cm infusion catheter with a 50 cm infusion length was advanced over the Rosen guidewire into the occluded left femoral popliteal bypass. Infusion catheter terminates in the patent native popliteal artery distal to the popliteal anastomosis. Position confirmed with fluoroscopy. Images obtained for documentation.  Thrombolytic infusion therapy will  begin with Tenecteplase at a dose 0.4 milligrams/hour.  FINDINGS: Left lower extremity angiogram confirms recurrent left femoral  popliteal bypass graft occlusion. This was successfully crossed with catheter and guidewire system to insert an infusion catheter as described for thrombolytic therapy.  IMPRESSION: Recurrent occlusion of the left femoral to popliteal bypass graft. This was successfully crossed with insertion of an infusion catheter as described. Left lower extremity bypass graft thrombolysis will be initiated at a rate of 0.4 milligrams/hour.  Follow up angiogram will be performed tomorrow morning.  These results were called by telephone at the time of interpretation on 02/16/2014 at 11:48 AM to Dr. Sherren Mocha Lucee Brissett , who verbally acknowledged these results.   Electronically Signed   By: Daryll Brod M.D.   On: 02/16/2014 11:53   Ir US Guide Vasc Access Right  02/16/2014   CLINICAL DATA:  Peripheral vascular disease, left lower extremity femoral to above knee popliteal bypass recurrent occlusion, left leg pain  EXAM: ULTRASOUND GUIDANCE FOR VASCULAR ACCESS  LEFT LOWER EXTREMITY ANGIOGRAM  TRANSCATHETER ARTERIAL THROMBOLYSIS OF THE LEFT LOWER EXTREMITY BYPASS  Date:  4/4/20154/02/2014 10:40 AM  Radiologist:  Jerilynn Mages. Daryll Brod, MD  Guidance:  Ultrasound and fluoroscopic  FLUOROSCOPY TIME:  6 min 24 seconds  MEDICATIONS AND MEDICAL HISTORY: 3 mg Versed, 75 mcg fentanyl  ANESTHESIA/SEDATION: 45 min  CONTRAST:  23mL VISIPAQUE IODIXANOL 320 MG/ML IV SOLN  COMPLICATIONS: No immediate  PROCEDURE: Informed consent was obtained from the patient following explanation of the procedure, risks, benefits and alternatives. The patient understands, agrees and consents for the procedure. All questions were addressed. A time out was performed.  Maximal barrier sterile technique utilized including caps, mask, sterile gowns, sterile gloves, large sterile drape, hand hygiene, and ChloraPrep.  Under sterile conditions and local anesthesia, right common femoral artery micropuncture access was performed with ultrasound. Images obtained for documentation. Guidewire  advanced into the aorta. A 6 French 45 cm [facility] guide sheath was advanced to the bifurcation. Five [place] Sos catheter was utilized to select the bifurcation. A guidewire was advanced into the left external iliac artery. The catheter and sheath were advanced as a unit into the contralateral left external iliac artery.  Left lower extremity angiogram was performed.  Left lower extremity: Diffuse left iliac vascular disease noted. There is occlusion of the proximal native left SFA. Profunda femoral artery remains patent. There is occlusion of the left femoral bypass graft just below the femoral anastomosis. Harvie Junior Melida Gimenez catheter and Glidewire were utilized to access the proximal femoral anastomosis. Catheter and guidewire access advanced through the occluded femoral popliteal bypass peripherally. Access was advanced past the distal anastomosis. Contrast injection confirms position within the native above knee popliteal artery.  Below the bypass, the popliteal artery and bifurcation remains patent. There is preserved 2 vessel anterior tibial and posterior tibial runoff.  Rosen exchange guidewire advanced. A 135 cm infusion catheter with a 50 cm infusion length was advanced over the Rosen guidewire into the occluded left femoral popliteal bypass. Infusion catheter terminates in the patent native popliteal artery distal to the popliteal anastomosis. Position confirmed with fluoroscopy. Images obtained for documentation.  Thrombolytic infusion therapy will begin with Tenecteplase at a dose 0.4 milligrams/hour.  FINDINGS: Left lower extremity angiogram confirms recurrent left femoral popliteal bypass graft occlusion. This was successfully crossed with catheter and guidewire system to insert an infusion catheter as described for thrombolytic therapy.  IMPRESSION: Recurrent occlusion of the left femoral to popliteal bypass graft. This was successfully  crossed with insertion of an infusion catheter as described. Left  lower extremity bypass graft thrombolysis will be initiated at a rate of 0.4 milligrams/hour.  Follow up angiogram will be performed tomorrow morning.  These results were called by telephone at the time of interpretation on 02/16/2014 at 11:48 AM to Dr. Sherren Mocha Evelynn Hench , who verbally acknowledged these results.   Electronically Signed   By: Daryll Brod M.D.   On: 02/16/2014 11:53   Ir Infusion Thrombol Arterial Initial (ms)  02/16/2014   CLINICAL DATA:  Peripheral vascular disease, left lower extremity femoral to above knee popliteal bypass recurrent occlusion, left leg pain  EXAM: ULTRASOUND GUIDANCE FOR VASCULAR ACCESS  LEFT LOWER EXTREMITY ANGIOGRAM  TRANSCATHETER ARTERIAL THROMBOLYSIS OF THE LEFT LOWER EXTREMITY BYPASS  Date:  4/4/20154/02/2014 10:40 AM  Radiologist:  Jerilynn Mages. Daryll Brod, MD  Guidance:  Ultrasound and fluoroscopic  FLUOROSCOPY TIME:  6 min 24 seconds  MEDICATIONS AND MEDICAL HISTORY: 3 mg Versed, 75 mcg fentanyl  ANESTHESIA/SEDATION: 45 min  CONTRAST:  68mL VISIPAQUE IODIXANOL 320 MG/ML IV SOLN  COMPLICATIONS: No immediate  PROCEDURE: Informed consent was obtained from the patient following explanation of the procedure, risks, benefits and alternatives. The patient understands, agrees and consents for the procedure. All questions were addressed. A time out was performed.  Maximal barrier sterile technique utilized including caps, mask, sterile gowns, sterile gloves, large sterile drape, hand hygiene, and ChloraPrep.  Under sterile conditions and local anesthesia, right common femoral artery micropuncture access was performed with ultrasound. Images obtained for documentation. Guidewire advanced into the aorta. A 6 French 45 cm [facility] guide sheath was advanced to the bifurcation. Five [place] Sos catheter was utilized to select the bifurcation. A guidewire was advanced into the left external iliac artery. The catheter and sheath were advanced as a unit into the contralateral left external iliac artery.  Left  lower extremity angiogram was performed.  Left lower extremity: Diffuse left iliac vascular disease noted. There is occlusion of the proximal native left SFA. Profunda femoral artery remains patent. There is occlusion of the left femoral bypass graft just below the femoral anastomosis. Harvie Junior Melida Gimenez catheter and Glidewire were utilized to access the proximal femoral anastomosis. Catheter and guidewire access advanced through the occluded femoral popliteal bypass peripherally. Access was advanced past the distal anastomosis. Contrast injection confirms position within the native above knee popliteal artery.  Below the bypass, the popliteal artery and bifurcation remains patent. There is preserved 2 vessel anterior tibial and posterior tibial runoff.  Rosen exchange guidewire advanced. A 135 cm infusion catheter with a 50 cm infusion length was advanced over the Rosen guidewire into the occluded left femoral popliteal bypass. Infusion catheter terminates in the patent native popliteal artery distal to the popliteal anastomosis. Position confirmed with fluoroscopy. Images obtained for documentation.  Thrombolytic infusion therapy will begin with Tenecteplase at a dose 0.4 milligrams/hour.  FINDINGS: Left lower extremity angiogram confirms recurrent left femoral popliteal bypass graft occlusion. This was successfully crossed with catheter and guidewire system to insert an infusion catheter as described for thrombolytic therapy.  IMPRESSION: Recurrent occlusion of the left femoral to popliteal bypass graft. This was successfully crossed with insertion of an infusion catheter as described. Left lower extremity bypass graft thrombolysis will be initiated at a rate of 0.4 milligrams/hour.  Follow up angiogram will be performed tomorrow morning.  These results were called by telephone at the time of interpretation on 02/16/2014 at 11:48 AM to Dr. Sherren Mocha Annis Lagoy , who  verbally acknowledged these results.   Electronically  Signed   By: Daryll Brod M.D.   On: 02/16/2014 11:53   Anti-infectives: Anti-infectives   None      Assessment/Plan: s/p * No surgery found * Stable with a 24 hours of lysis of her occluded left femoropopliteal or Tex graft. For repeat imaging this morning. I discussed this with the patient. I explained that she does have an easily palpable popliteal pulse with the graft is open. I explained that she may require additional treatment depending on the results of the arteriogram.   LOS: 2 days   Junior Huezo 02/17/2014, 8:18 AM

## 2014-02-17 NOTE — Procedures (Signed)
Patent LLE FEM TO ABOVE KNEE POP BYPASS after 24 hrs TNK lysis Good PT runoff into the foot D/C TNK and hold heparin Plan for sheath removal in 2 hours Can restart heparin with no bolus at 2pm  Discussed with Early

## 2014-02-17 NOTE — Progress Notes (Signed)
[place] for Heparin Indication: FPBG thrombosis  Allergies  Allergen Reactions  . Meperidine Hcl Other (See Comments)    nevousness  . Penicillins Other (See Comments)    Patient passed out    Patient Measurements: Height: 5\' 4"  (162.6 cm) Weight: 90 lb (40.824 kg) IBW/kg (Calculated) : 54.7 Heparin Dosing Weight: 40.8 kg  Vital Signs: Temp: 98.4 F (36.9 C) (04/05 0000) Temp src: Oral (04/05 0000) BP: 116/70 mmHg (04/05 0000) Pulse Rate: 58 (04/05 0000)  Labs:  Recent Labs  02/15/14 1400 02/15/14 2015 02/16/14 1255 02/16/14 1700 02/17/14 0005  HGB 11.6* 11.5* 12.0 11.8* 10.8*  HCT 36.0 35.5* 37.3 37.0 34.0*  PLT 151 145* 125* 131* 126*  LABPROT 22.3* 22.1* 20.7*  --   --   INR 2.03* 2.00* 1.84*  --   --   HEPARINUNFRC  --   --  0.37 0.70 0.80*  CREATININE 0.88 0.87  --   --   --     Estimated Creatinine Clearance: 40.4 ml/min (by C-G formula based on Cr of 0.87).  Assessment: 68 yo female with LLE fem pop bypass occlusion, on TNKase for lysis, for heparin   Goal of Therapy:  Heparin level 0.2 to 0.5 units/ml Monitor platelets by anticoagulation protocol: Yes   Plan:  Hold heparin x 45 min, then restart heparin 300 units/hr F/U after repeat arteriogram  Phillis Knack, PharmD, BCPS   02/17/2014,1:12 AM

## 2014-02-17 NOTE — Sedation Documentation (Signed)
Sheath will be pulled in couple of hours per MD. Heparin to be restarted at 2pm. No sedation needed.

## 2014-02-17 NOTE — Sedation Documentation (Addendum)
MD looking at pictures. Pulses great. No intervention will be needed now. TNK and Heparin turned off per MD.

## 2014-02-17 NOTE — Progress Notes (Signed)
6 fr RFA sheath removed at 1213 by Melchor Amour RT R CV using a 6 fr Exoseal device.  Manual pressure applied for 10 minutes. Hemostasis obtained 1225.  No hematoma noted. Groin reviewed with Apolonio Schneiders and tegaderm dressing applied.  5 lb sandbag placed on right groin site.  Distal pulses stable.  Cristobal Advani RT R CV

## 2014-02-17 NOTE — Progress Notes (Addendum)
[place] for Heparin Indication: FPBG thrombosis  Allergies  Allergen Reactions  . Meperidine Hcl Other (See Comments)    nevousness  . Penicillins Other (See Comments)    Patient passed out    Patient Measurements: Height: 5\' 4"  (162.6 cm) Weight: 90 lb (40.824 kg) IBW/kg (Calculated) : 54.7 Heparin Dosing Weight: 40.8 kg  Vital Signs: Temp: 98.2 F (36.8 C) (04/05 0758) Temp src: Oral (04/05 0758) BP: 119/68 mmHg (04/05 1045) Pulse Rate: 54 (04/05 1045)  Labs:  Recent Labs  02/15/14 1400 02/15/14 2015  02/16/14 1255 02/16/14 1700 02/17/14 0005 02/17/14 0422  HGB 11.6* 11.5*  --  12.0 11.8* 10.8* 10.2*  HCT 36.0 35.5*  --  37.3 37.0 34.0* 31.8*  PLT 151 145*  --  125* 131* 126* 120*  LABPROT 22.3* 22.1*  --  20.7*  --   --   --   INR 2.03* 2.00*  --  1.84*  --   --   --   HEPARINUNFRC  --   --   < > 0.37 0.70 0.80* 0.39  CREATININE 0.88 0.87  --   --   --   --   --   < > = values in this interval not displayed.  Estimated Creatinine Clearance: 40.4 ml/min (by C-G formula based on Cr of 0.87).  Assessment: 68 yo female with LLE fem pop bypass occlusion s/p TNK lysis now with patent fem pop bypass to restart IV heparin at 1400PM today. TNK d/c'd.   Goal of Therapy:  Heparin level 0.2 to 0.5 units/ml >> increase goal to 0.3 to 0.7 in AM.  Monitor platelets by anticoagulation protocol: Yes   Plan:  Restart heparin at 300 units/hr at 1400 PM.  - Rate and monitoring plans confirmed with Dr. Donnetta Hutching. No bolus.  Heparin level 6 hours after restart.  Daily heparin level and CBC.   Sloan Leiter, PharmD, BCPS Clinical Pharmacist [phone]  02/17/2014,11:26 AM   Addendum: Heparin level undetectable on 300 units/hr. Patient is off TNK.  No bolus.  Plan: Increase heparin to 600 units/hr. Heparin level in 6 hours.  Daily HL and CBC.   Sloan Leiter, PharmD, BCPS Clinical Pharmacist [phone] 02/17/2014, 9:56 PM

## 2014-02-18 ENCOUNTER — Ambulatory Visit: Payer: Self-pay

## 2014-02-18 LAB — CBC
HCT: 31.3 % — ABNORMAL LOW (ref 36.0–46.0)
Hemoglobin: 10.2 g/dL — ABNORMAL LOW (ref 12.0–15.0)
MCH: 28.3 pg (ref 26.0–34.0)
MCHC: 32.6 g/dL (ref 30.0–36.0)
MCV: 86.7 fL (ref 78.0–100.0)
Platelets: 128 10*3/uL — ABNORMAL LOW (ref 150–400)
RBC: 3.61 MIL/uL — AB (ref 3.87–5.11)
RDW: 14.2 % (ref 11.5–15.5)
WBC: 4.1 10*3/uL (ref 4.0–10.5)

## 2014-02-18 LAB — HEPARIN LEVEL (UNFRACTIONATED)
HEPARIN UNFRACTIONATED: 1.44 [IU]/mL — AB (ref 0.30–0.70)
Heparin Unfractionated: 0.33 IU/mL (ref 0.30–0.70)
Heparin Unfractionated: 0.46 IU/mL (ref 0.30–0.70)

## 2014-02-18 MED ORDER — WARFARIN - PHARMACIST DOSING INPATIENT
Freq: Every day | Status: DC
  Administered 2014-02-18: 18:00:00

## 2014-02-18 MED ORDER — WARFARIN SODIUM 7.5 MG PO TABS
7.5000 mg | ORAL_TABLET | Freq: Once | ORAL | Status: AC
Start: 2014-02-18 — End: 2014-02-18
  Administered 2014-02-18: 7.5 mg via ORAL
  Filled 2014-02-18: qty 1

## 2014-02-18 MED ORDER — TRAMADOL HCL 50 MG PO TABS: 50.0000 mg | ORAL_TABLET | Freq: Two times a day (BID) | ORAL | Status: DC | PRN

## 2014-02-18 NOTE — Progress Notes (Signed)
ANTICOAGULATION CONSULT NOTE - Follow Up Consult  Pharmacy Consult for Heparin + Coumadin Indication: Occluded FPBPG  Allergies  Allergen Reactions  . Meperidine Hcl Other (See Comments)    nevousness  . Penicillins Other (See Comments)    Patient passed out    Patient Measurements: Height: 5\' 4"  (162.6 cm) Weight: 90 lb (40.824 kg) IBW/kg (Calculated) : 54.7 Heparin Dosing Weight: 40.8 kg  Vital Signs: Temp: 99 F (37.2 C) (04/06 1200) Temp src: Oral (04/06 1200) BP: 108/60 mmHg (04/06 1500) Pulse Rate: 70 (04/06 1500)  Labs:  Recent Labs  02/15/14 2015 02/16/14 1255  02/17/14 0005 02/17/14 0422  02/18/14 0447 02/18/14 1100 02/18/14 1625  HGB 11.5* 12.0  < > 10.8* 10.2*  --  10.2*  --   --   HCT 35.5* 37.3  < > 34.0* 31.8*  --  31.3*  --   --   PLT 145* 125*  < > 126* 120*  --  128*  --   --   LABPROT 22.1* 20.7*  --   --   --   --   --   --   --   INR 2.00* 1.84*  --   --   --   --   --   --   --   HEPARINUNFRC  --  0.37  < > 0.80* 0.39  < > 0.33 1.44* 0.46  CREATININE 0.87  --   --   --   --   --   --   --   --   < > = values in this interval not displayed.  Estimated Creatinine Clearance: 40.4 ml/min (by C-G formula based on Cr of 0.87).   Assessment: 73 YOF with occluded fem-pop graft s/p TNK lysis.  Coumadin PTA (6mg  MWF, 4mg  TTSS). Admit INR 2.03  Anticoagulation: IV heparin + TNK until re-evaled on 4/5. Re-evaluation shows patent LLE fem-pop bypass after 24hr TNK lysis. TNK d/c'd on 4/5 and sheath removed. Restarted heparin - no bolus. Heparin level this PM 0.46 (AM level may have been a lab drawing error). Plan to resume Coumadin today also.  Goal of Therapy:  Heparin level 0.3-0.7 units/ml INR 2-3 Monitor platelets by anticoagulation protocol: Yes   Plan:  Continue heparin at 600 units/hr Start Coumadin at 7.5mg  po x 1 tonight.   Kristin Knight S. Alford Highland, PharmD, BCPS Clinical Staff Pharmacist Pager [phone]  Kristin Ghazi  Knight 02/18/2014,5:33 PM

## 2014-02-18 NOTE — Progress Notes (Signed)
[place] for Heparin Indication: FPBG thrombosis  Allergies  Allergen Reactions  . Meperidine Hcl Other (See Comments)    nevousness  . Penicillins Other (See Comments)    Patient passed out    Patient Measurements: Height: 5\' 4"  (162.6 cm) Weight: 90 lb (40.824 kg) IBW/kg (Calculated) : 54.7 Heparin Dosing Weight: 40.8 kg  Vital Signs: Temp: 99 F (37.2 C) (04/06 1200) Temp src: Oral (04/06 1200) BP: 143/101 mmHg (04/06 1300) Pulse Rate: 61 (04/06 1300)  Labs:  Recent Labs  02/15/14 1400 02/15/14 2015 02/16/14 1255  02/17/14 0005 02/17/14 0422 02/17/14 2030 02/18/14 0447 02/18/14 1100  HGB 11.6* 11.5* 12.0  < > 10.8* 10.2*  --  10.2*  --   HCT 36.0 35.5* 37.3  < > 34.0* 31.8*  --  31.3*  --   PLT 151 145* 125*  < > 126* 120*  --  128*  --   LABPROT 22.3* 22.1* 20.7*  --   --   --   --   --   --   INR 2.03* 2.00* 1.84*  --   --   --   --   --   --   HEPARINUNFRC  --   --  0.37  < > 0.80* 0.39 <0.10* 0.33 1.44*  CREATININE 0.88 0.87  --   --   --   --   --   --   --   < > = values in this interval not displayed.  Estimated Creatinine Clearance: 40.4 ml/min (by C-G formula based on Cr of 0.87).  Assessment: 68 yo female with LLE fem pop bypass occlusion s/p TNK lysis now with patent fem pop bypass and restarted IV heparin and TNK d/c'd on 4/5 at ~0900.  HL last night on recheck was subtherapeutic, and HL this AM is 0.33.    HL on recheck is 1.44.  Spoke with RN, and is only able to draw lab in lines, and may have drawn HL where heparin infusion is.  Dr. Donnetta Hutching is ok with drawing another HL.  H/H are low but stable, and PLT is low stable at 128.     Goal of Therapy:  Heparin level 0.2 to 0.5 units/ml >> increase goal to 0.3 to 0.7 in AM.  Monitor platelets by anticoagulation protocol: Yes   Plan:  Continue heparin 600 units/hr Recheck heparin level at 1430 Daily heparin level and CBC.   Jeronimo Norma, PharmD Clinical  Pharmacist Resident Pager: [phone]  02/18/2014,1:45 PM

## 2014-02-18 NOTE — Progress Notes (Signed)
[place] for Heparin Indication: FPBG thrombosis  Allergies  Allergen Reactions  . Meperidine Hcl Other (See Comments)    nevousness  . Penicillins Other (See Comments)    Patient passed out    Patient Measurements: Height: 5\' 4"  (162.6 cm) Weight: 90 lb (40.824 kg) IBW/kg (Calculated) : 54.7 Heparin Dosing Weight: 40.8 kg  Vital Signs: Temp: 98.6 F (37 C) (04/06 0700) Temp src: Oral (04/06 0700) BP: 120/73 mmHg (04/06 0800) Pulse Rate: 55 (04/06 0800)  Labs:  Recent Labs  02/15/14 1400 02/15/14 2015 02/16/14 1255  02/17/14 0005 02/17/14 0422 02/17/14 2030 02/18/14 0447  HGB 11.6* 11.5* 12.0  < > 10.8* 10.2*  --  10.2*  HCT 36.0 35.5* 37.3  < > 34.0* 31.8*  --  31.3*  PLT 151 145* 125*  < > 126* 120*  --  128*  LABPROT 22.3* 22.1* 20.7*  --   --   --   --   --   INR 2.03* 2.00* 1.84*  --   --   --   --   --   HEPARINUNFRC  --   --  0.37  < > 0.80* 0.39 <0.10* 0.33  CREATININE 0.88 0.87  --   --   --   --   --   --   < > = values in this interval not displayed.  Estimated Creatinine Clearance: 40.4 ml/min (by C-G formula based on Cr of 0.87).  Assessment: 68 yo female with LLE fem pop bypass occlusion s/p TNK lysis now with patent fem pop bypass and restarted IV heparin and TNK d/c'd on 4/5.  HL last night on recheck was subtherapeutic, and HL this AM is 0.33.  H/H are low but stable, and PLT is low stable at 128.     Goal of Therapy:  Heparin level 0.2 to 0.5 units/ml >> increase goal to 0.3 to 0.7 in AM.  Monitor platelets by anticoagulation protocol: Yes   Plan:  Continue heparin at 600 units/hr Recheck therapeutic level in 6 hours at 1100 Daily heparin level and CBC.   Jeronimo Norma, PharmD Clinical Pharmacist Resident Pager: [phone]  02/18/2014,8:27 AM   Addendum: Heparin level undetectable on 300 units/hr. Patient is off TNK.  No bolus.  Plan: Increase heparin to 600 units/hr. Heparin level in 6 hours.   Daily HL and CBC.   Sloan Leiter, PharmD, BCPS Clinical Pharmacist [phone] 02/18/2014, 8:27 AM

## 2014-02-18 NOTE — Clinical Documentation Improvement (Signed)
INITIAL NUTRITION ASSESSMENT  02/16/14 Patient continues to meet criteria for severe malnutrition in the context of chronic illness as evidenced by < 75% intake of estimated energy requirement for > 1 month and severe muscle & subcutaneous fat loss  Possible Clinical Conditions?  Severe Malnutrition   Protein Calorie Malnutrition Severe Protein Calorie Malnutrition  Other Condition Cannot clinically determine  Supporting Information:reports her appetite wasn't very good PTA Subcutaneous Fat: Upper Arm Region: severe depletion Muscle: Temple Region: severe depletion  Clavicle Bone Region: severe depletion  Clavicle and Acromion Bone Region: severe depletion  Presently: Diet NPO d/t pending procedure    Risk Factors: Signs & Symptoms: Diagnostics:02/15/14  Ht: 5\' 4"    Wt: 90 lb BMI: 15.44  Treatment:  Thank Glendora Score ,RN Clinical Documentation Specialist:  [place] [employer] Management

## 2014-02-18 NOTE — Progress Notes (Signed)
Subjective: Pt sitting up in bed. Denies any c/o (L)leg feels good  Objective: Physical Exam: BP 116/71  Pulse 63  Temp(Src) 98.6 F (37 C) (Oral)  Resp 16  Ht 5\' 4"  (1.626 m)  Wt 90 lb (40.824 kg)  BMI 15.44 kg/m2  SpO2 97%  LMP 01/13/1974 (R)groin soft, NT, no hematoma. Dressing dry. Foot warm. (L)LE warm, nl ROM, nl sensation, brisk cap refill +AT doppler signal-(sounds monophasic to me), +PT signal, biphasic    Labs: CBC  Recent Labs  02/17/14 0422 02/18/14 0447  WBC 4.0 4.1  HGB 10.2* 10.2*  HCT 31.8* 31.3*  PLT 120* 128*   BMET  Recent Labs  02/15/14 1400 02/15/14 2015  NA 137 140  K 3.5* 3.8  CL 97 100  CO2 27 28  GLUCOSE 87 144*  BUN 17 13  CREATININE 0.88 0.87  CALCIUM 9.4 9.0   LFT  Recent Labs  02/15/14 2015  PROT 6.8  ALBUMIN 3.4*  AST 15  ALT 7  ALKPHOS 45  BILITOT 0.2*   PT/INR  Recent Labs  02/15/14 2015 02/16/14 1255  LABPROT 22.1* 20.7*  INR 2.00* 1.84*     Studies/Results: Ir Angiogram Extremity Left  02/16/2014   CLINICAL DATA:  Peripheral vascular disease, left lower extremity femoral to above knee popliteal bypass recurrent occlusion, left leg pain  EXAM: ULTRASOUND GUIDANCE FOR VASCULAR ACCESS  LEFT LOWER EXTREMITY ANGIOGRAM  TRANSCATHETER ARTERIAL THROMBOLYSIS OF THE LEFT LOWER EXTREMITY BYPASS  Date:  4/4/20154/02/2014 10:40 AM  Radiologist:  Jerilynn Mages. Daryll Brod, MD  Guidance:  Ultrasound and fluoroscopic  FLUOROSCOPY TIME:  6 min 24 seconds  MEDICATIONS AND MEDICAL HISTORY: 3 mg Versed, 75 mcg fentanyl  ANESTHESIA/SEDATION: 45 min  CONTRAST:  22mL VISIPAQUE IODIXANOL 320 MG/ML IV SOLN  COMPLICATIONS: No immediate  PROCEDURE: Informed consent was obtained from the patient following explanation of the procedure, risks, benefits and alternatives. The patient understands, agrees and consents for the procedure. All questions were addressed. A time out was performed.  Maximal barrier sterile technique utilized including caps,  mask, sterile gowns, sterile gloves, large sterile drape, hand hygiene, and ChloraPrep.  Under sterile conditions and local anesthesia, right common femoral artery micropuncture access was performed with ultrasound. Images obtained for documentation. Guidewire advanced into the aorta. A 6 French 45 cm [facility] guide sheath was advanced to the bifurcation. Five [place] Sos catheter was utilized to select the bifurcation. A guidewire was advanced into the left external iliac artery. The catheter and sheath were advanced as a unit into the contralateral left external iliac artery.  Left lower extremity angiogram was performed.  Left lower extremity: Diffuse left iliac vascular disease noted. There is occlusion of the proximal native left SFA. Profunda femoral artery remains patent. There is occlusion of the left femoral bypass graft just below the femoral anastomosis. Harvie Junior Melida Gimenez catheter and Glidewire were utilized to access the proximal femoral anastomosis. Catheter and guidewire access advanced through the occluded femoral popliteal bypass peripherally. Access was advanced past the distal anastomosis. Contrast injection confirms position within the native above knee popliteal artery.  Below the bypass, the popliteal artery and bifurcation remains patent. There is preserved 2 vessel anterior tibial and posterior tibial runoff.  Rosen exchange guidewire advanced. A 135 cm infusion catheter with a 50 cm infusion length was advanced over the Rosen guidewire into the occluded left femoral popliteal bypass. Infusion catheter terminates in the patent native popliteal artery distal to the popliteal anastomosis. Position confirmed with  fluoroscopy. Images obtained for documentation.  Thrombolytic infusion therapy will begin with Tenecteplase at a dose 0.4 milligrams/hour.  FINDINGS: Left lower extremity angiogram confirms recurrent left femoral popliteal bypass graft occlusion. This was successfully crossed with catheter  and guidewire system to insert an infusion catheter as described for thrombolytic therapy.  IMPRESSION: Recurrent occlusion of the left femoral to popliteal bypass graft. This was successfully crossed with insertion of an infusion catheter as described. Left lower extremity bypass graft thrombolysis will be initiated at a rate of 0.4 milligrams/hour.  Follow up angiogram will be performed tomorrow morning.  These results were called by telephone at the time of interpretation on 02/16/2014 at 11:48 AM to Dr. Sherren Mocha EARLY , who verbally acknowledged these results.   Electronically Signed   By: Daryll Brod M.D.   On: 02/16/2014 11:53   Ir US Guide Vasc Access Right  02/16/2014   CLINICAL DATA:  Peripheral vascular disease, left lower extremity femoral to above knee popliteal bypass recurrent occlusion, left leg pain  EXAM: ULTRASOUND GUIDANCE FOR VASCULAR ACCESS  LEFT LOWER EXTREMITY ANGIOGRAM  TRANSCATHETER ARTERIAL THROMBOLYSIS OF THE LEFT LOWER EXTREMITY BYPASS  Date:  4/4/20154/02/2014 10:40 AM  Radiologist:  Jerilynn Mages. Daryll Brod, MD  Guidance:  Ultrasound and fluoroscopic  FLUOROSCOPY TIME:  6 min 24 seconds  MEDICATIONS AND MEDICAL HISTORY: 3 mg Versed, 75 mcg fentanyl  ANESTHESIA/SEDATION: 45 min  CONTRAST:  85mL VISIPAQUE IODIXANOL 320 MG/ML IV SOLN  COMPLICATIONS: No immediate  PROCEDURE: Informed consent was obtained from the patient following explanation of the procedure, risks, benefits and alternatives. The patient understands, agrees and consents for the procedure. All questions were addressed. A time out was performed.  Maximal barrier sterile technique utilized including caps, mask, sterile gowns, sterile gloves, large sterile drape, hand hygiene, and ChloraPrep.  Under sterile conditions and local anesthesia, right common femoral artery micropuncture access was performed with ultrasound. Images obtained for documentation. Guidewire advanced into the aorta. A 6 French 45 cm [facility] guide sheath was advanced to  the bifurcation. Five [place] Sos catheter was utilized to select the bifurcation. A guidewire was advanced into the left external iliac artery. The catheter and sheath were advanced as a unit into the contralateral left external iliac artery.  Left lower extremity angiogram was performed.  Left lower extremity: Diffuse left iliac vascular disease noted. There is occlusion of the proximal native left SFA. Profunda femoral artery remains patent. There is occlusion of the left femoral bypass graft just below the femoral anastomosis. Harvie Junior Melida Gimenez catheter and Glidewire were utilized to access the proximal femoral anastomosis. Catheter and guidewire access advanced through the occluded femoral popliteal bypass peripherally. Access was advanced past the distal anastomosis. Contrast injection confirms position within the native above knee popliteal artery.  Below the bypass, the popliteal artery and bifurcation remains patent. There is preserved 2 vessel anterior tibial and posterior tibial runoff.  Rosen exchange guidewire advanced. A 135 cm infusion catheter with a 50 cm infusion length was advanced over the Rosen guidewire into the occluded left femoral popliteal bypass. Infusion catheter terminates in the patent native popliteal artery distal to the popliteal anastomosis. Position confirmed with fluoroscopy. Images obtained for documentation.  Thrombolytic infusion therapy will begin with Tenecteplase at a dose 0.4 milligrams/hour.  FINDINGS: Left lower extremity angiogram confirms recurrent left femoral popliteal bypass graft occlusion. This was successfully crossed with catheter and guidewire system to insert an infusion catheter as described for thrombolytic therapy.  IMPRESSION: Recurrent occlusion of  the left femoral to popliteal bypass graft. This was successfully crossed with insertion of an infusion catheter as described. Left lower extremity bypass graft thrombolysis will be initiated at a rate of 0.4  milligrams/hour.  Follow up angiogram will be performed tomorrow morning.  These results were called by telephone at the time of interpretation on 02/16/2014 at 11:48 AM to Dr. Sherren Mocha EARLY , who verbally acknowledged these results.   Electronically Signed   By: Daryll Brod M.D.   On: 02/16/2014 11:53   Ir Infusion Thrombol Arterial Initial (ms)  02/16/2014   CLINICAL DATA:  Peripheral vascular disease, left lower extremity femoral to above knee popliteal bypass recurrent occlusion, left leg pain  EXAM: ULTRASOUND GUIDANCE FOR VASCULAR ACCESS  LEFT LOWER EXTREMITY ANGIOGRAM  TRANSCATHETER ARTERIAL THROMBOLYSIS OF THE LEFT LOWER EXTREMITY BYPASS  Date:  4/4/20154/02/2014 10:40 AM  Radiologist:  Jerilynn Mages. Daryll Brod, MD  Guidance:  Ultrasound and fluoroscopic  FLUOROSCOPY TIME:  6 min 24 seconds  MEDICATIONS AND MEDICAL HISTORY: 3 mg Versed, 75 mcg fentanyl  ANESTHESIA/SEDATION: 45 min  CONTRAST:  68mL VISIPAQUE IODIXANOL 320 MG/ML IV SOLN  COMPLICATIONS: No immediate  PROCEDURE: Informed consent was obtained from the patient following explanation of the procedure, risks, benefits and alternatives. The patient understands, agrees and consents for the procedure. All questions were addressed. A time out was performed.  Maximal barrier sterile technique utilized including caps, mask, sterile gowns, sterile gloves, large sterile drape, hand hygiene, and ChloraPrep.  Under sterile conditions and local anesthesia, right common femoral artery micropuncture access was performed with ultrasound. Images obtained for documentation. Guidewire advanced into the aorta. A 6 French 45 cm [facility] guide sheath was advanced to the bifurcation. Five [place] Sos catheter was utilized to select the bifurcation. A guidewire was advanced into the left external iliac artery. The catheter and sheath were advanced as a unit into the contralateral left external iliac artery.  Left lower extremity angiogram was performed.  Left lower extremity: Diffuse left  iliac vascular disease noted. There is occlusion of the proximal native left SFA. Profunda femoral artery remains patent. There is occlusion of the left femoral bypass graft just below the femoral anastomosis. Harvie Junior Melida Gimenez catheter and Glidewire were utilized to access the proximal femoral anastomosis. Catheter and guidewire access advanced through the occluded femoral popliteal bypass peripherally. Access was advanced past the distal anastomosis. Contrast injection confirms position within the native above knee popliteal artery.  Below the bypass, the popliteal artery and bifurcation remains patent. There is preserved 2 vessel anterior tibial and posterior tibial runoff.  Rosen exchange guidewire advanced. A 135 cm infusion catheter with a 50 cm infusion length was advanced over the Rosen guidewire into the occluded left femoral popliteal bypass. Infusion catheter terminates in the patent native popliteal artery distal to the popliteal anastomosis. Position confirmed with fluoroscopy. Images obtained for documentation.  Thrombolytic infusion therapy will begin with Tenecteplase at a dose 0.4 milligrams/hour.  FINDINGS: Left lower extremity angiogram confirms recurrent left femoral popliteal bypass graft occlusion. This was successfully crossed with catheter and guidewire system to insert an infusion catheter as described for thrombolytic therapy.  IMPRESSION: Recurrent occlusion of the left femoral to popliteal bypass graft. This was successfully crossed with insertion of an infusion catheter as described. Left lower extremity bypass graft thrombolysis will be initiated at a rate of 0.4 milligrams/hour.  Follow up angiogram will be performed tomorrow morning.  These results were called by telephone at the time of interpretation on  02/16/2014 at 11:48 AM to Dr. Sherren Mocha EARLY , who verbally acknowledged these results.   Electronically Signed   By: Daryll Brod M.D.   On: 02/16/2014 11:53   Ir Jacolyn Reedy F/u Eval  Art/ven Alwyn Ren Day (ms)  02/17/2014   CLINICAL DATA:  24 hr status post left lower extremity femoral popliteal bypass transcatheter thrombolysis  EXAM: FOLLOW-UP LEFT LOWER EXTREMITY ANGIOGRAM  Date:  4/5/20154/03/2014 9:35 AM  Radiologist:  Jerilynn Mages. Daryll Brod, MD  Guidance:  Fluoroscopic  FLUOROSCOPY TIME:  2 min 12 seconds  MEDICATIONS AND MEDICAL HISTORY: None.  ANESTHESIA/SEDATION: None.  CONTRAST:  62mL VISIPAQUE IODIXANOL 320 MG/ML IV SOLN  COMPLICATIONS: No immediate  PROCEDURE: Informed consent was obtained from the patient following explanation of the procedure, risks, benefits and alternatives. The patient understands, agrees and consents for the procedure. All questions were addressed. A time out was performed.  Maximal barrier sterile technique utilized including caps, mask, sterile gowns, sterile gloves, large sterile drape, hand hygiene, and ChloraPrep.  Under sterile conditions, contrast injection performed through the 6 French sheath into the left lower extremity for initial left lower extremity angiogram. This demonstrates patient's E of the left femoral to above knee popliteal bypass. For better visualization, the infusion catheter was removed. Completion left lower extremity angiogram performed through the 6 French sheath.  Left lower extremity angiogram: Left external iliac and common femoral arteries are patent. Profunda femoral artery remains patent. Chronic occlusion proximally of the native left SFA. The left femoral to above knee popliteal bypass graft is now patent following thrombolysis. Minimal graft irregularity distally but no flow limiting significant stenosis or large residual intragraft thrombus. Distal anastomosis is patent. Popliteal artery is patent across the knee. There is preserved 2 vessel runoff via the anterior tibial and posterior tibial arteries. Peroneal artery is occluded. Dominant runoff vessel into the foot is the posterior tibial artery.  IMPRESSION: Patent left femoral  to above knee popliteal bypass following 24 hr TNK thrombolysis  These results were called by telephone at the time of interpretation on 02/17/2014 at 9:48 AM to Dr. Sherren Mocha EARLY , who verbally acknowledged these results.   Electronically Signed   By: Daryll Brod M.D.   On: 02/17/2014 09:49    Assessment/Plan: S/p successful thrombolysis of (L)LE fem-pop bypass graft Currently on hep gtt, next plan per [facility] Call IR if needed.    LOS: 3 days    Ascencion Dike PA-C 02/18/2014 9:24 AM

## 2014-02-18 NOTE — Progress Notes (Signed)
Report called to receiving unit 2W19. Past medical history and present hospitalization course reviewed. All belongings transported with patient via wheelchair. Patient aware of transport to 2W19 and will update family members.

## 2014-02-19 LAB — CBC
HCT: 31 % — ABNORMAL LOW (ref 36.0–46.0)
Hemoglobin: 10 g/dL — ABNORMAL LOW (ref 12.0–15.0)
MCH: 28.1 pg (ref 26.0–34.0)
MCHC: 32.3 g/dL (ref 30.0–36.0)
MCV: 87.1 fL (ref 78.0–100.0)
PLATELETS: 128 10*3/uL — AB (ref 150–400)
RBC: 3.56 MIL/uL — ABNORMAL LOW (ref 3.87–5.11)
RDW: 14.4 % (ref 11.5–15.5)
WBC: 5.1 10*3/uL (ref 4.0–10.5)

## 2014-02-19 LAB — HEPARIN LEVEL (UNFRACTIONATED): Heparin Unfractionated: 0.38 IU/mL (ref 0.30–0.70)

## 2014-02-19 LAB — PROTIME-INR
INR: 1.38 (ref 0.00–1.49)
Prothrombin Time: 16.6 seconds — ABNORMAL HIGH (ref 11.6–15.2)

## 2014-02-19 MED ORDER — WARFARIN SODIUM 7.5 MG PO TABS
7.5000 mg | ORAL_TABLET | Freq: Once | ORAL | Status: AC
  Administered 2014-02-19: 7.5 mg via ORAL
  Filled 2014-02-19: qty 1

## 2014-02-19 NOTE — Progress Notes (Signed)
ANTICOAGULATION CONSULT NOTE - Follow Up Consult  Pharmacy Consult for Heparin + Coumadin Indication: Occluded FPBPG s/p lytic therapy  Allergies  Allergen Reactions  . Meperidine Hcl Other (See Comments)    nevousness  . Penicillins Other (See Comments)    Patient passed out    Patient Measurements: Height: 5\' 4"  (162.6 cm) Weight: 90 lb (40.824 kg) IBW/kg (Calculated) : 54.7 Heparin Dosing Weight: 40.8 kg  Vital Signs: Temp: 98.2 F (36.8 C) (04/07 0425) Temp src: Oral (04/07 0425) BP: 126/65 mmHg (04/07 0425) Pulse Rate: 62 (04/07 0425)  Labs:  Recent Labs  02/16/14 1255  02/17/14 0422  02/18/14 0447 02/18/14 1100 02/18/14 1625 02/19/14 0603  HGB 12.0  < > 10.2*  --  10.2*  --   --  10.0*  HCT 37.3  < > 31.8*  --  31.3*  --   --  31.0*  PLT 125*  < > 120*  --  128*  --   --  128*  LABPROT 20.7*  --   --   --   --   --   --  16.6*  INR 1.84*  --   --   --   --   --   --  1.38  HEPARINUNFRC 0.37  < > 0.39  < > 0.33 1.44* 0.46 0.38  < > = values in this interval not displayed.  Estimated Creatinine Clearance: 40.4 ml/min (by C-G formula based on Cr of 0.87).   Assessment: 68 yo F with occluded fem-pop graft s/p TNK lysis.  Coumadin PTA (6mg  MWF, 4mg  TTSS). Admit INR 2.03  Pt continues on IV heparin bridging to Coumadin.  Heparin level is therapeutic this AM on 600 units/hr.  INR subtherapeutic (as expected).  Coumadin was restarted yesterday.  Goal of Therapy:  Heparin level 0.3-0.7 units/ml INR 2-3 Monitor platelets by anticoagulation protocol: Yes   Plan:  Continue heparin at 600 units/hr Repeat Coumadin at 7.5mg  po x 1 tonight.  [employer], Pharm.D., BCPS Clinical Pharmacist Pager [phone] 02/19/2014 8:39 AM

## 2014-02-19 NOTE — Progress Notes (Signed)
Upon assessment of patient, R groin dressing with oozing present. No hematoma appreciated. Pressure held. Groin redressed with tegaderm and gauze. Will continue to monitor pt closely.

## 2014-02-19 NOTE — Progress Notes (Signed)
Subjective: Interval History: none.. denies any pain in her left foot. Has been up walking.  Objective: Vital signs in last 24 hours: Temp:  [98.2 F (36.8 C)-99 F (37.2 C)] 98.2 F (36.8 C) (04/07 0425) Pulse Rate:  [59-75] 62 (04/07 0425) Resp:  [10-19] 18 (04/07 0425) BP: (108-143)/(60-101) 126/65 mmHg (04/07 0425) SpO2:  [97 %-100 %] 100 % (04/07 0425)  Intake/Output from previous day: 04/06 0701 - 04/07 0700 In: 632 [P.O.:240; I.V.:392] Out: 400 [Urine:400] Intake/Output this shift: Total I/O In: 240 [P.O.:240] Out: -   Palpable left popliteal pulse. The left foot well-perfused. No hematoma in her right groin. Dressing removed.  Lab Results:  Recent Labs  02/18/14 0447 02/19/14 0603  WBC 4.1 5.1  HGB 10.2* 10.0*  HCT 31.3* 31.0*  PLT 128* 128*   BMET No results found for this basename: NA, K, CL, CO2, GLUCOSE, BUN, CREATININE, CALCIUM,  in the last 72 hours  Studies/Results: Ir Angiogram Extremity Left  02/16/2014   CLINICAL DATA:  Peripheral vascular disease, left lower extremity femoral to above knee popliteal bypass recurrent occlusion, left leg pain  EXAM: ULTRASOUND GUIDANCE FOR VASCULAR ACCESS  LEFT LOWER EXTREMITY ANGIOGRAM  TRANSCATHETER ARTERIAL THROMBOLYSIS OF THE LEFT LOWER EXTREMITY BYPASS  Date:  4/4/20154/02/2014 10:40 AM  Radiologist:  Jerilynn Mages. Daryll Brod, MD  Guidance:  Ultrasound and fluoroscopic  FLUOROSCOPY TIME:  6 min 24 seconds  MEDICATIONS AND MEDICAL HISTORY: 3 mg Versed, 75 mcg fentanyl  ANESTHESIA/SEDATION: 45 min  CONTRAST:  45mL VISIPAQUE IODIXANOL 320 MG/ML IV SOLN  COMPLICATIONS: No immediate  PROCEDURE: Informed consent was obtained from the patient following explanation of the procedure, risks, benefits and alternatives. The patient understands, agrees and consents for the procedure. All questions were addressed. A time out was performed.  Maximal barrier sterile technique utilized including caps, mask, sterile gowns, sterile gloves, large  sterile drape, hand hygiene, and ChloraPrep.  Under sterile conditions and local anesthesia, right common femoral artery micropuncture access was performed with ultrasound. Images obtained for documentation. Guidewire advanced into the aorta. A 6 French 45 cm [facility] guide sheath was advanced to the bifurcation. Five [place] Sos catheter was utilized to select the bifurcation. A guidewire was advanced into the left external iliac artery. The catheter and sheath were advanced as a unit into the contralateral left external iliac artery.  Left lower extremity angiogram was performed.  Left lower extremity: Diffuse left iliac vascular disease noted. There is occlusion of the proximal native left SFA. Profunda femoral artery remains patent. There is occlusion of the left femoral bypass graft just below the femoral anastomosis. Harvie Junior Melida Gimenez catheter and Glidewire were utilized to access the proximal femoral anastomosis. Catheter and guidewire access advanced through the occluded femoral popliteal bypass peripherally. Access was advanced past the distal anastomosis. Contrast injection confirms position within the native above knee popliteal artery.  Below the bypass, the popliteal artery and bifurcation remains patent. There is preserved 2 vessel anterior tibial and posterior tibial runoff.  Rosen exchange guidewire advanced. A 135 cm infusion catheter with a 50 cm infusion length was advanced over the Rosen guidewire into the occluded left femoral popliteal bypass. Infusion catheter terminates in the patent native popliteal artery distal to the popliteal anastomosis. Position confirmed with fluoroscopy. Images obtained for documentation.  Thrombolytic infusion therapy will begin with Tenecteplase at a dose 0.4 milligrams/hour.  FINDINGS: Left lower extremity angiogram confirms recurrent left femoral popliteal bypass graft occlusion. This was successfully crossed with catheter and guidewire  system to insert an infusion  catheter as described for thrombolytic therapy.  IMPRESSION: Recurrent occlusion of the left femoral to popliteal bypass graft. This was successfully crossed with insertion of an infusion catheter as described. Left lower extremity bypass graft thrombolysis will be initiated at a rate of 0.4 milligrams/hour.  Follow up angiogram will be performed tomorrow morning.  These results were called by telephone at the time of interpretation on 02/16/2014 at 11:48 AM to Dr. Sherren Mocha Brayley Mackowiak , who verbally acknowledged these results.   Electronically Signed   By: Daryll Brod M.D.   On: 02/16/2014 11:53   Ir US Guide Vasc Access Right  02/16/2014   CLINICAL DATA:  Peripheral vascular disease, left lower extremity femoral to above knee popliteal bypass recurrent occlusion, left leg pain  EXAM: ULTRASOUND GUIDANCE FOR VASCULAR ACCESS  LEFT LOWER EXTREMITY ANGIOGRAM  TRANSCATHETER ARTERIAL THROMBOLYSIS OF THE LEFT LOWER EXTREMITY BYPASS  Date:  4/4/20154/02/2014 10:40 AM  Radiologist:  Jerilynn Mages. Daryll Brod, MD  Guidance:  Ultrasound and fluoroscopic  FLUOROSCOPY TIME:  6 min 24 seconds  MEDICATIONS AND MEDICAL HISTORY: 3 mg Versed, 75 mcg fentanyl  ANESTHESIA/SEDATION: 45 min  CONTRAST:  52mL VISIPAQUE IODIXANOL 320 MG/ML IV SOLN  COMPLICATIONS: No immediate  PROCEDURE: Informed consent was obtained from the patient following explanation of the procedure, risks, benefits and alternatives. The patient understands, agrees and consents for the procedure. All questions were addressed. A time out was performed.  Maximal barrier sterile technique utilized including caps, mask, sterile gowns, sterile gloves, large sterile drape, hand hygiene, and ChloraPrep.  Under sterile conditions and local anesthesia, right common femoral artery micropuncture access was performed with ultrasound. Images obtained for documentation. Guidewire advanced into the aorta. A 6 French 45 cm [facility] guide sheath was advanced to the bifurcation. Five [place] Sos catheter was  utilized to select the bifurcation. A guidewire was advanced into the left external iliac artery. The catheter and sheath were advanced as a unit into the contralateral left external iliac artery.  Left lower extremity angiogram was performed.  Left lower extremity: Diffuse left iliac vascular disease noted. There is occlusion of the proximal native left SFA. Profunda femoral artery remains patent. There is occlusion of the left femoral bypass graft just below the femoral anastomosis. Harvie Junior Melida Gimenez catheter and Glidewire were utilized to access the proximal femoral anastomosis. Catheter and guidewire access advanced through the occluded femoral popliteal bypass peripherally. Access was advanced past the distal anastomosis. Contrast injection confirms position within the native above knee popliteal artery.  Below the bypass, the popliteal artery and bifurcation remains patent. There is preserved 2 vessel anterior tibial and posterior tibial runoff.  Rosen exchange guidewire advanced. A 135 cm infusion catheter with a 50 cm infusion length was advanced over the Rosen guidewire into the occluded left femoral popliteal bypass. Infusion catheter terminates in the patent native popliteal artery distal to the popliteal anastomosis. Position confirmed with fluoroscopy. Images obtained for documentation.  Thrombolytic infusion therapy will begin with Tenecteplase at a dose 0.4 milligrams/hour.  FINDINGS: Left lower extremity angiogram confirms recurrent left femoral popliteal bypass graft occlusion. This was successfully crossed with catheter and guidewire system to insert an infusion catheter as described for thrombolytic therapy.  IMPRESSION: Recurrent occlusion of the left femoral to popliteal bypass graft. This was successfully crossed with insertion of an infusion catheter as described. Left lower extremity bypass graft thrombolysis will be initiated at a rate of 0.4 milligrams/hour.  Follow up angiogram will be  performed tomorrow morning.  These results were called by telephone at the time of interpretation on 02/16/2014 at 11:48 AM to Dr. Sherren Mocha Avalon Coppinger , who verbally acknowledged these results.   Electronically Signed   By: Daryll Brod M.D.   On: 02/16/2014 11:53   Ir Infusion Thrombol Arterial Initial (ms)  02/16/2014   CLINICAL DATA:  Peripheral vascular disease, left lower extremity femoral to above knee popliteal bypass recurrent occlusion, left leg pain  EXAM: ULTRASOUND GUIDANCE FOR VASCULAR ACCESS  LEFT LOWER EXTREMITY ANGIOGRAM  TRANSCATHETER ARTERIAL THROMBOLYSIS OF THE LEFT LOWER EXTREMITY BYPASS  Date:  4/4/20154/02/2014 10:40 AM  Radiologist:  Jerilynn Mages. Daryll Brod, MD  Guidance:  Ultrasound and fluoroscopic  FLUOROSCOPY TIME:  6 min 24 seconds  MEDICATIONS AND MEDICAL HISTORY: 3 mg Versed, 75 mcg fentanyl  ANESTHESIA/SEDATION: 45 min  CONTRAST:  14mL VISIPAQUE IODIXANOL 320 MG/ML IV SOLN  COMPLICATIONS: No immediate  PROCEDURE: Informed consent was obtained from the patient following explanation of the procedure, risks, benefits and alternatives. The patient understands, agrees and consents for the procedure. All questions were addressed. A time out was performed.  Maximal barrier sterile technique utilized including caps, mask, sterile gowns, sterile gloves, large sterile drape, hand hygiene, and ChloraPrep.  Under sterile conditions and local anesthesia, right common femoral artery micropuncture access was performed with ultrasound. Images obtained for documentation. Guidewire advanced into the aorta. A 6 French 45 cm [facility] guide sheath was advanced to the bifurcation. Five [place] Sos catheter was utilized to select the bifurcation. A guidewire was advanced into the left external iliac artery. The catheter and sheath were advanced as a unit into the contralateral left external iliac artery.  Left lower extremity angiogram was performed.  Left lower extremity: Diffuse left iliac vascular disease noted. There is  occlusion of the proximal native left SFA. Profunda femoral artery remains patent. There is occlusion of the left femoral bypass graft just below the femoral anastomosis. Harvie Junior Melida Gimenez catheter and Glidewire were utilized to access the proximal femoral anastomosis. Catheter and guidewire access advanced through the occluded femoral popliteal bypass peripherally. Access was advanced past the distal anastomosis. Contrast injection confirms position within the native above knee popliteal artery.  Below the bypass, the popliteal artery and bifurcation remains patent. There is preserved 2 vessel anterior tibial and posterior tibial runoff.  Rosen exchange guidewire advanced. A 135 cm infusion catheter with a 50 cm infusion length was advanced over the Rosen guidewire into the occluded left femoral popliteal bypass. Infusion catheter terminates in the patent native popliteal artery distal to the popliteal anastomosis. Position confirmed with fluoroscopy. Images obtained for documentation.  Thrombolytic infusion therapy will begin with Tenecteplase at a dose 0.4 milligrams/hour.  FINDINGS: Left lower extremity angiogram confirms recurrent left femoral popliteal bypass graft occlusion. This was successfully crossed with catheter and guidewire system to insert an infusion catheter as described for thrombolytic therapy.  IMPRESSION: Recurrent occlusion of the left femoral to popliteal bypass graft. This was successfully crossed with insertion of an infusion catheter as described. Left lower extremity bypass graft thrombolysis will be initiated at a rate of 0.4 milligrams/hour.  Follow up angiogram will be performed tomorrow morning.  These results were called by telephone at the time of interpretation on 02/16/2014 at 11:48 AM to Dr. Sherren Mocha Han Vejar , who verbally acknowledged these results.   Electronically Signed   By: Daryll Brod M.D.   On: 02/16/2014 11:53   Ir Jacolyn Reedy F/u Eval Art/ven Alwyn Ren Day (ms)  02/17/2014    CLINICAL DATA:  24 hr status post left lower extremity femoral popliteal bypass transcatheter thrombolysis  EXAM: FOLLOW-UP LEFT LOWER EXTREMITY ANGIOGRAM  Date:  4/5/20154/03/2014 9:35 AM  Radiologist:  Jerilynn Mages. Daryll Brod, MD  Guidance:  Fluoroscopic  FLUOROSCOPY TIME:  2 min 12 seconds  MEDICATIONS AND MEDICAL HISTORY: None.  ANESTHESIA/SEDATION: None.  CONTRAST:  35mL VISIPAQUE IODIXANOL 320 MG/ML IV SOLN  COMPLICATIONS: No immediate  PROCEDURE: Informed consent was obtained from the patient following explanation of the procedure, risks, benefits and alternatives. The patient understands, agrees and consents for the procedure. All questions were addressed. A time out was performed.  Maximal barrier sterile technique utilized including caps, mask, sterile gowns, sterile gloves, large sterile drape, hand hygiene, and ChloraPrep.  Under sterile conditions, contrast injection performed through the 6 French sheath into the left lower extremity for initial left lower extremity angiogram. This demonstrates patient's E of the left femoral to above knee popliteal bypass. For better visualization, the infusion catheter was removed. Completion left lower extremity angiogram performed through the 6 French sheath.  Left lower extremity angiogram: Left external iliac and common femoral arteries are patent. Profunda femoral artery remains patent. Chronic occlusion proximally of the native left SFA. The left femoral to above knee popliteal bypass graft is now patent following thrombolysis. Minimal graft irregularity distally but no flow limiting significant stenosis or large residual intragraft thrombus. Distal anastomosis is patent. Popliteal artery is patent across the knee. There is preserved 2 vessel runoff via the anterior tibial and posterior tibial arteries. Peroneal artery is occluded. Dominant runoff vessel into the foot is the posterior tibial artery.  IMPRESSION: Patent left femoral to above knee popliteal bypass  following 24 hr TNK thrombolysis  These results were called by telephone at the time of interpretation on 02/17/2014 at 9:48 AM to Dr. Sherren Mocha Tinnie Kunin , who verbally acknowledged these results.   Electronically Signed   By: Daryll Brod M.D.   On: 02/17/2014 09:49   Anti-infectives: Anti-infectives   None      Assessment/Plan: s/p * No surgery found * Stable status post lysis of left femoral to above-knee popliteal graft with Gore-Tex. On heparin with no complications. Converting to Coumadin prior to discharge home. PT INR pending this morning. Pharmacy is managing anticoagulation. Patient does have severe known calorie protein malnutrition was done well documented on prior ignitions as well by the medical teaching service.   LOS: 4 days   Vianny Schraeder 02/19/2014, 8:34 AM

## 2014-02-20 LAB — HEPARIN LEVEL (UNFRACTIONATED): HEPARIN UNFRACTIONATED: 0.34 [IU]/mL (ref 0.30–0.70)

## 2014-02-20 LAB — CBC
HEMATOCRIT: 33.6 % — AB (ref 36.0–46.0)
Hemoglobin: 10.9 g/dL — ABNORMAL LOW (ref 12.0–15.0)
MCH: 28.5 pg (ref 26.0–34.0)
MCHC: 32.4 g/dL (ref 30.0–36.0)
MCV: 88 fL (ref 78.0–100.0)
Platelets: 152 10*3/uL (ref 150–400)
RBC: 3.82 MIL/uL — ABNORMAL LOW (ref 3.87–5.11)
RDW: 14.7 % (ref 11.5–15.5)
WBC: 4 10*3/uL (ref 4.0–10.5)

## 2014-02-20 LAB — PROTIME-INR
INR: 1.66 — ABNORMAL HIGH (ref 0.00–1.49)
Prothrombin Time: 19.1 seconds — ABNORMAL HIGH (ref 11.6–15.2)

## 2014-02-20 MED ORDER — WARFARIN SODIUM 7.5 MG PO TABS
7.5000 mg | ORAL_TABLET | Freq: Once | ORAL | Status: AC
  Administered 2014-02-20: 7.5 mg via ORAL
  Filled 2014-02-20: qty 1

## 2014-02-20 MED ORDER — WARFARIN SODIUM 4 MG PO TABS: 4.0000 mg | ORAL_TABLET | Freq: Every day | ORAL | Status: DC

## 2014-02-20 MED ORDER — ENSURE COMPLETE PO LIQD
237.0000 mL | Freq: Two times a day (BID) | ORAL | Status: DC
  Administered 2014-02-20: 237 mL via ORAL
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
## 2014-02-20 NOTE — Progress Notes (Signed)
ANTICOAGULATION CONSULT NOTE - Follow Up Consult  Pharmacy Consult for Heparin + Coumadin Indication: Occluded FPBPG s/p lytic therapy  Allergies  Allergen Reactions  . Meperidine Hcl Other (See Comments)    nevousness  . Penicillins Other (See Comments)    Patient passed out    Patient Measurements: Height: 5\' 4"  (162.6 cm) Weight: 90 lb (40.824 kg) IBW/kg (Calculated) : 54.7 Heparin Dosing Weight: 40.8 kg  Vital Signs: Temp: 98.3 F (36.8 C) (04/08 0518) Temp src: Oral (04/08 0518) BP: 101/56 mmHg (04/08 0518) Pulse Rate: 58 (04/08 0518)  Labs:  Recent Labs  02/18/14 0447  02/18/14 1625 02/19/14 0603 02/20/14 0340  HGB 10.2*  --   --  10.0* 10.9*  HCT 31.3*  --   --  31.0* 33.6*  PLT 128*  --   --  128* 152  LABPROT  --   --   --  16.6* 19.1*  INR  --   --   --  1.38 1.66*  HEPARINUNFRC 0.33  < > 0.46 0.38 0.34  < > = values in this interval not displayed.  Estimated Creatinine Clearance: 40.4 ml/min (by C-G formula based on Cr of 0.87).   Assessment: 68 yo F with occluded fem-pop graft s/p TNK lysis.  Coumadin PTA (6mg  MWF, 4mg  TTSS). Admit INR 2.03  Pt continues on IV heparin bridging to Coumadin.  Heparin level is therapeutic this AM on 600 units/hr.  INR subtherapeutic but trending up.  Coumadin was restarted 4/6.  Goal of Therapy:  Heparin level 0.3-0.7 units/ml INR 2-3 Monitor platelets by anticoagulation protocol: Yes   Plan:  Continue heparin at 600 units/hr Repeat Coumadin at 7.5mg  po x 1 tonight. Depending on INR in AM, can likely restart home Coumadin regimen 4/9.  [employer], Pharm.D., BCPS Clinical Pharmacist Pager [phone] 02/20/2014 9:15 AM

## 2014-02-20 NOTE — Progress Notes (Addendum)
[facility] Progress Note  02/20/2014 9:16 AM   Subjective:  No complaints  Afebrile VSS 94% RA Filed Vitals:   02/20/14 0518  BP: 101/56  Pulse: 58  Temp: 98.3 F (36.8 C)  Resp: 18    Physical Exam: Incisions:  Right groin is soft with clean bandage in place Extremities:  Bilateral feet are warm.  CBC    Component Value Date/Time   WBC 4.0 02/20/2014 0340   RBC 3.82* 02/20/2014 0340   HGB 10.9* 02/20/2014 0340   HCT 33.6* 02/20/2014 0340   PLT 152 02/20/2014 0340   MCV 88.0 02/20/2014 0340   MCH 28.5 02/20/2014 0340   MCHC 32.4 02/20/2014 0340   RDW 14.7 02/20/2014 0340   LYMPHSABS 1.8 02/15/2014 1400   MONOABS 0.3 02/15/2014 1400   EOSABS 0.1 02/15/2014 1400   BASOSABS 0.0 02/15/2014 1400    BMET    Component Value Date/Time   NA 140 02/15/2014 2015   K 3.8 02/15/2014 2015   CL 100 02/15/2014 2015   CO2 28 02/15/2014 2015   GLUCOSE 144* 02/15/2014 2015   BUN 13 02/15/2014 2015   CREATININE 0.87 02/15/2014 2015   CREATININE 0.86 11/24/2012 1112   CALCIUM 9.0 02/15/2014 2015   GFRNONAA 67* 02/15/2014 2015   GFRNONAA 71 11/24/2012 1112   GFRAA 78* 02/15/2014 2015   GFRAA 82 11/24/2012 1112    INR    Component Value Date/Time   INR 1.66* 02/20/2014 0340   INR 2.10 01/22/2014 1640     Intake/Output Summary (Last 24 hours) at 02/20/14 0916 Last data filed at 02/20/14 0700  Gross per 24 hour  Intake    720 ml  Output    750 ml  Net    -30 ml     Assessment:  68 y.o. female is s/p:  lysis of left femoral to above-knee popliteal graft with Gore-Tex  POD 4  Plan: -pt doing well today.  Her INR is trending upward, but is not therapeutic.  She has her INR checked by Dr. Johney Maine.  Suspect she will need to f/u with him on Monday depending on her discharge. -right groin is soft with dry bandage in place. -DVT prophylaxis:  Heparin gtt transitioning to coumadin -INR 1.66 today after coumadin 7.5 mg x 2 doses-will need to be therapeutic before discharge.   Leontine Locket,  PA-C [facility] [phone] 02/20/2014 9:16 AM    I have examined the patient, reviewed and agree with above.  Rosetta Posner, MD 02/20/2014 2:34 PM

## 2014-02-21 ENCOUNTER — Other Ambulatory Visit: Payer: Self-pay | Admitting: *Deleted

## 2014-02-21 ENCOUNTER — Telehealth: Payer: Self-pay | Admitting: Vascular Surgery

## 2014-02-21 DIAGNOSIS — I739 Peripheral vascular disease, unspecified: Secondary | ICD-10-CM

## 2014-02-21 DIAGNOSIS — Z48812 Encounter for surgical aftercare following surgery on the circulatory system: Secondary | ICD-10-CM

## 2014-02-21 LAB — PROTIME-INR
INR: 2.02 — AB (ref 0.00–1.49)
Prothrombin Time: 22.2 seconds — ABNORMAL HIGH (ref 11.6–15.2)

## 2014-02-21 LAB — CBC
HCT: 33.9 % — ABNORMAL LOW (ref 36.0–46.0)
Hemoglobin: 10.8 g/dL — ABNORMAL LOW (ref 12.0–15.0)
MCH: 28.2 pg (ref 26.0–34.0)
MCHC: 31.9 g/dL (ref 30.0–36.0)
MCV: 88.5 fL (ref 78.0–100.0)
Platelets: 198 10*3/uL (ref 150–400)
RBC: 3.83 MIL/uL — ABNORMAL LOW (ref 3.87–5.11)
RDW: 15 % (ref 11.5–15.5)
WBC: 4.6 10*3/uL (ref 4.0–10.5)

## 2014-02-21 LAB — HEPARIN LEVEL (UNFRACTIONATED): Heparin Unfractionated: 0.29 IU/mL — ABNORMAL LOW (ref 0.30–0.70)

## 2014-02-21 MED ORDER — TRAMADOL HCL 50 MG PO TABS: 50.0000 mg | ORAL_TABLET | Freq: Two times a day (BID) | ORAL | Status: DC | PRN
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
## 2014-02-21 NOTE — Progress Notes (Signed)
Subjective: Interval History: none.. no ischemic symptoms  Objective: Vital signs in last 24 hours: Temp:  [97.9 F (36.6 C)-98.6 F (37 C)] 98.6 F (37 C) (04/09 0423) Pulse Rate:  [56-69] 62 (04/09 0423) Resp:  [18] 18 (04/09 0423) BP: (97-118)/(53-70) 103/64 mmHg (04/09 0423) SpO2:  [95 %-100 %] 98 % (04/09 0423)  Intake/Output from previous day: 04/08 0701 - 04/09 0700 In: 552 [P.O.:480; I.V.:72] Out: -  Intake/Output this shift:    Palpable posterior tibial pulse on the left  Lab Results:  Recent Labs  02/20/14 0340 02/21/14 0351  WBC 4.0 4.6  HGB 10.9* 10.8*  HCT 33.6* 33.9*  PLT 152 198   BMET No results found for this basename: NA, K, CL, CO2, GLUCOSE, BUN, CREATININE, CALCIUM,  in the last 72 hours  Studies/Results: Ir Angiogram Extremity Left  02/16/2014   CLINICAL DATA:  Peripheral vascular disease, left lower extremity femoral to above knee popliteal bypass recurrent occlusion, left leg pain  EXAM: ULTRASOUND GUIDANCE FOR VASCULAR ACCESS  LEFT LOWER EXTREMITY ANGIOGRAM  TRANSCATHETER ARTERIAL THROMBOLYSIS OF THE LEFT LOWER EXTREMITY BYPASS  Date:  4/4/20154/02/2014 10:40 AM  Radiologist:  Jerilynn Mages. Daryll Brod, MD  Guidance:  Ultrasound and fluoroscopic  FLUOROSCOPY TIME:  6 min 24 seconds  MEDICATIONS AND MEDICAL HISTORY: 3 mg Versed, 75 mcg fentanyl  ANESTHESIA/SEDATION: 45 min  CONTRAST:  38mL VISIPAQUE IODIXANOL 320 MG/ML IV SOLN  COMPLICATIONS: No immediate  PROCEDURE: Informed consent was obtained from the patient following explanation of the procedure, risks, benefits and alternatives. The patient understands, agrees and consents for the procedure. All questions were addressed. A time out was performed.  Maximal barrier sterile technique utilized including caps, mask, sterile gowns, sterile gloves, large sterile drape, hand hygiene, and ChloraPrep.  Under sterile conditions and local anesthesia, right common femoral artery micropuncture access was performed with  ultrasound. Images obtained for documentation. Guidewire advanced into the aorta. A 6 French 45 cm [facility] guide sheath was advanced to the bifurcation. Five [place] Sos catheter was utilized to select the bifurcation. A guidewire was advanced into the left external iliac artery. The catheter and sheath were advanced as a unit into the contralateral left external iliac artery.  Left lower extremity angiogram was performed.  Left lower extremity: Diffuse left iliac vascular disease noted. There is occlusion of the proximal native left SFA. Profunda femoral artery remains patent. There is occlusion of the left femoral bypass graft just below the femoral anastomosis. Harvie Junior Melida Gimenez catheter and Glidewire were utilized to access the proximal femoral anastomosis. Catheter and guidewire access advanced through the occluded femoral popliteal bypass peripherally. Access was advanced past the distal anastomosis. Contrast injection confirms position within the native above knee popliteal artery.  Below the bypass, the popliteal artery and bifurcation remains patent. There is preserved 2 vessel anterior tibial and posterior tibial runoff.  Rosen exchange guidewire advanced. A 135 cm infusion catheter with a 50 cm infusion length was advanced over the Rosen guidewire into the occluded left femoral popliteal bypass. Infusion catheter terminates in the patent native popliteal artery distal to the popliteal anastomosis. Position confirmed with fluoroscopy. Images obtained for documentation.  Thrombolytic infusion therapy will begin with Tenecteplase at a dose 0.4 milligrams/hour.  FINDINGS: Left lower extremity angiogram confirms recurrent left femoral popliteal bypass graft occlusion. This was successfully crossed with catheter and guidewire system to insert an infusion catheter as described for thrombolytic therapy.  IMPRESSION: Recurrent occlusion of the left femoral to popliteal bypass graft.  This was successfully crossed  with insertion of an infusion catheter as described. Left lower extremity bypass graft thrombolysis will be initiated at a rate of 0.4 milligrams/hour.  Follow up angiogram will be performed tomorrow morning.  These results were called by telephone at the time of interpretation on 02/16/2014 at 11:48 AM to Dr. Sherren Mocha Levonia Wolfley , who verbally acknowledged these results.   Electronically Signed   By: Daryll Brod M.D.   On: 02/16/2014 11:53   Ir US Guide Vasc Access Right  02/16/2014   CLINICAL DATA:  Peripheral vascular disease, left lower extremity femoral to above knee popliteal bypass recurrent occlusion, left leg pain  EXAM: ULTRASOUND GUIDANCE FOR VASCULAR ACCESS  LEFT LOWER EXTREMITY ANGIOGRAM  TRANSCATHETER ARTERIAL THROMBOLYSIS OF THE LEFT LOWER EXTREMITY BYPASS  Date:  4/4/20154/02/2014 10:40 AM  Radiologist:  Jerilynn Mages. Daryll Brod, MD  Guidance:  Ultrasound and fluoroscopic  FLUOROSCOPY TIME:  6 min 24 seconds  MEDICATIONS AND MEDICAL HISTORY: 3 mg Versed, 75 mcg fentanyl  ANESTHESIA/SEDATION: 45 min  CONTRAST:  11mL VISIPAQUE IODIXANOL 320 MG/ML IV SOLN  COMPLICATIONS: No immediate  PROCEDURE: Informed consent was obtained from the patient following explanation of the procedure, risks, benefits and alternatives. The patient understands, agrees and consents for the procedure. All questions were addressed. A time out was performed.  Maximal barrier sterile technique utilized including caps, mask, sterile gowns, sterile gloves, large sterile drape, hand hygiene, and ChloraPrep.  Under sterile conditions and local anesthesia, right common femoral artery micropuncture access was performed with ultrasound. Images obtained for documentation. Guidewire advanced into the aorta. A 6 French 45 cm [facility] guide sheath was advanced to the bifurcation. Five [place] Sos catheter was utilized to select the bifurcation. A guidewire was advanced into the left external iliac artery. The catheter and sheath were advanced as a unit into the  contralateral left external iliac artery.  Left lower extremity angiogram was performed.  Left lower extremity: Diffuse left iliac vascular disease noted. There is occlusion of the proximal native left SFA. Profunda femoral artery remains patent. There is occlusion of the left femoral bypass graft just below the femoral anastomosis. Harvie Junior Melida Gimenez catheter and Glidewire were utilized to access the proximal femoral anastomosis. Catheter and guidewire access advanced through the occluded femoral popliteal bypass peripherally. Access was advanced past the distal anastomosis. Contrast injection confirms position within the native above knee popliteal artery.  Below the bypass, the popliteal artery and bifurcation remains patent. There is preserved 2 vessel anterior tibial and posterior tibial runoff.  Rosen exchange guidewire advanced. A 135 cm infusion catheter with a 50 cm infusion length was advanced over the Rosen guidewire into the occluded left femoral popliteal bypass. Infusion catheter terminates in the patent native popliteal artery distal to the popliteal anastomosis. Position confirmed with fluoroscopy. Images obtained for documentation.  Thrombolytic infusion therapy will begin with Tenecteplase at a dose 0.4 milligrams/hour.  FINDINGS: Left lower extremity angiogram confirms recurrent left femoral popliteal bypass graft occlusion. This was successfully crossed with catheter and guidewire system to insert an infusion catheter as described for thrombolytic therapy.  IMPRESSION: Recurrent occlusion of the left femoral to popliteal bypass graft. This was successfully crossed with insertion of an infusion catheter as described. Left lower extremity bypass graft thrombolysis will be initiated at a rate of 0.4 milligrams/hour.  Follow up angiogram will be performed tomorrow morning.  These results were called by telephone at the time of interpretation on 02/16/2014 at 11:48 AM to Dr. Sherren Mocha  Mamoudou Mulvehill , who verbally  acknowledged these results.   Electronically Signed   By: Daryll Brod M.D.   On: 02/16/2014 11:53   Ir Infusion Thrombol Arterial Initial (ms)  02/16/2014   CLINICAL DATA:  Peripheral vascular disease, left lower extremity femoral to above knee popliteal bypass recurrent occlusion, left leg pain  EXAM: ULTRASOUND GUIDANCE FOR VASCULAR ACCESS  LEFT LOWER EXTREMITY ANGIOGRAM  TRANSCATHETER ARTERIAL THROMBOLYSIS OF THE LEFT LOWER EXTREMITY BYPASS  Date:  4/4/20154/02/2014 10:40 AM  Radiologist:  Jerilynn Mages. Daryll Brod, MD  Guidance:  Ultrasound and fluoroscopic  FLUOROSCOPY TIME:  6 min 24 seconds  MEDICATIONS AND MEDICAL HISTORY: 3 mg Versed, 75 mcg fentanyl  ANESTHESIA/SEDATION: 45 min  CONTRAST:  71mL VISIPAQUE IODIXANOL 320 MG/ML IV SOLN  COMPLICATIONS: No immediate  PROCEDURE: Informed consent was obtained from the patient following explanation of the procedure, risks, benefits and alternatives. The patient understands, agrees and consents for the procedure. All questions were addressed. A time out was performed.  Maximal barrier sterile technique utilized including caps, mask, sterile gowns, sterile gloves, large sterile drape, hand hygiene, and ChloraPrep.  Under sterile conditions and local anesthesia, right common femoral artery micropuncture access was performed with ultrasound. Images obtained for documentation. Guidewire advanced into the aorta. A 6 French 45 cm [facility] guide sheath was advanced to the bifurcation. Five [place] Sos catheter was utilized to select the bifurcation. A guidewire was advanced into the left external iliac artery. The catheter and sheath were advanced as a unit into the contralateral left external iliac artery.  Left lower extremity angiogram was performed.  Left lower extremity: Diffuse left iliac vascular disease noted. There is occlusion of the proximal native left SFA. Profunda femoral artery remains patent. There is occlusion of the left femoral bypass graft just below the femoral  anastomosis. Harvie Junior Melida Gimenez catheter and Glidewire were utilized to access the proximal femoral anastomosis. Catheter and guidewire access advanced through the occluded femoral popliteal bypass peripherally. Access was advanced past the distal anastomosis. Contrast injection confirms position within the native above knee popliteal artery.  Below the bypass, the popliteal artery and bifurcation remains patent. There is preserved 2 vessel anterior tibial and posterior tibial runoff.  Rosen exchange guidewire advanced. A 135 cm infusion catheter with a 50 cm infusion length was advanced over the Rosen guidewire into the occluded left femoral popliteal bypass. Infusion catheter terminates in the patent native popliteal artery distal to the popliteal anastomosis. Position confirmed with fluoroscopy. Images obtained for documentation.  Thrombolytic infusion therapy will begin with Tenecteplase at a dose 0.4 milligrams/hour.  FINDINGS: Left lower extremity angiogram confirms recurrent left femoral popliteal bypass graft occlusion. This was successfully crossed with catheter and guidewire system to insert an infusion catheter as described for thrombolytic therapy.  IMPRESSION: Recurrent occlusion of the left femoral to popliteal bypass graft. This was successfully crossed with insertion of an infusion catheter as described. Left lower extremity bypass graft thrombolysis will be initiated at a rate of 0.4 milligrams/hour.  Follow up angiogram will be performed tomorrow morning.  These results were called by telephone at the time of interpretation on 02/16/2014 at 11:48 AM to Dr. Sherren Mocha Kahlia Lagunes , who verbally acknowledged these results.   Electronically Signed   By: Daryll Brod M.D.   On: 02/16/2014 11:53   Ir Jacolyn Reedy F/u Eval Art/ven Alwyn Ren Day (ms)  02/17/2014   CLINICAL DATA:  24 hr status post left lower extremity femoral popliteal bypass transcatheter thrombolysis  EXAM: FOLLOW-UP LEFT  LOWER EXTREMITY ANGIOGRAM  Date:   4/5/20154/03/2014 9:35 AM  Radiologist:  Jerilynn Mages. Daryll Brod, MD  Guidance:  Fluoroscopic  FLUOROSCOPY TIME:  2 min 12 seconds  MEDICATIONS AND MEDICAL HISTORY: None.  ANESTHESIA/SEDATION: None.  CONTRAST:  67mL VISIPAQUE IODIXANOL 320 MG/ML IV SOLN  COMPLICATIONS: No immediate  PROCEDURE: Informed consent was obtained from the patient following explanation of the procedure, risks, benefits and alternatives. The patient understands, agrees and consents for the procedure. All questions were addressed. A time out was performed.  Maximal barrier sterile technique utilized including caps, mask, sterile gowns, sterile gloves, large sterile drape, hand hygiene, and ChloraPrep.  Under sterile conditions, contrast injection performed through the 6 French sheath into the left lower extremity for initial left lower extremity angiogram. This demonstrates patient's E of the left femoral to above knee popliteal bypass. For better visualization, the infusion catheter was removed. Completion left lower extremity angiogram performed through the 6 French sheath.  Left lower extremity angiogram: Left external iliac and common femoral arteries are patent. Profunda femoral artery remains patent. Chronic occlusion proximally of the native left SFA. The left femoral to above knee popliteal bypass graft is now patent following thrombolysis. Minimal graft irregularity distally but no flow limiting significant stenosis or large residual intragraft thrombus. Distal anastomosis is patent. Popliteal artery is patent across the knee. There is preserved 2 vessel runoff via the anterior tibial and posterior tibial arteries. Peroneal artery is occluded. Dominant runoff vessel into the foot is the posterior tibial artery.  IMPRESSION: Patent left femoral to above knee popliteal bypass following 24 hr TNK thrombolysis  These results were called by telephone at the time of interpretation on 02/17/2014 at 9:48 AM to Dr. Sherren Mocha Naevia Unterreiner , who verbally  acknowledged these results.   Electronically Signed   By: Daryll Brod M.D.   On: 02/17/2014 09:49   Anti-infectives: Anti-infectives   None      Assessment/Plan: s/p * No surgery found * Stable status post thrombolyzes. INR therapeutic today. We'll discontinue heparin and will discharge to home.   LOS: 6 days   Rosetta Posner 02/21/2014, 7:47 AM

## 2014-02-21 NOTE — Progress Notes (Signed)
Pt discharge home  Discharge instructions reviewed and prescription given. Pt vu. Encouraged pt to call with needs.

## 2014-02-21 NOTE — Telephone Encounter (Addendum)
Message copied by Gena Fray on Thu Feb 21, 2014  2:20 PM ------      Message from: [place], [place] K      Created: Thu Feb 21, 2014 10:18 AM      Regarding: Schedule                   ----- Message -----         From: Gabriel Earing, PA-C         Sent: 02/21/2014   9:34 AM           To: [facility] Charge Pool            S/p thrombolysis of occluded left fem pop graft 02/16/14.  F/u with Dr. Bridgett Larsson in 2-3 weeks.            Thanks,      Aldona Bar ------  ----- Message -----  From: Gabriel Earing, PA-C  Sent: 02/21/2014 10:02 AM  To: [facility] Charge Pool   She will also need ABI's when she comes to the office.   Thanks,  Aldona Bar   02/21/14: spoke with pt to schedule. Scheduled with NP due to [facility] being out of office, dpm

## 2014-02-21 NOTE — Progress Notes (Signed)
ANTICOAGULATION CONSULT NOTE - Follow Up Consult  Pharmacy Consult for Heparin + Coumadin Indication: Occluded FPBPG s/p lytic therapy  Allergies  Allergen Reactions  . Meperidine Hcl Other (See Comments)    nevousness  . Penicillins Other (See Comments)    Patient passed out    Patient Measurements: Height: 5\' 4"  (162.6 cm) Weight: 90 lb (40.824 kg) IBW/kg (Calculated) : 54.7 Heparin Dosing Weight: 40.8 kg  Vital Signs: Temp: 98.6 F (37 C) (04/09 0423) Temp src: Oral (04/09 0423) BP: 103/64 mmHg (04/09 0423) Pulse Rate: 62 (04/09 0423)  Labs:  Recent Labs  02/19/14 0603 02/20/14 0340 02/21/14 0351  HGB 10.0* 10.9* 10.8*  HCT 31.0* 33.6* 33.9*  PLT 128* 152 198  LABPROT 16.6* 19.1* 22.2*  INR 1.38 1.66* 2.02*  HEPARINUNFRC 0.38 0.34 0.29*    Estimated Creatinine Clearance: 40.4 ml/min (by C-G formula based on Cr of 0.87).   Assessment: 68 yo F with occluded fem-pop graft s/p TNK lysis.  Coumadin PTA (6mg  MWF, 4mg  TTSS). Admit INR 2.03  INR today is therapeutic.  Noted plans for d/c heparin and d/c home.  Recommend restarting home Coumadin dose at discharge.  Goal of Therapy:  Heparin level 0.3-0.7 units/ml INR 2-3 Monitor platelets by anticoagulation protocol: Yes   Plan:  D/C heparin and associated labs (per MD note) Recommend Restart PTA Coumadin dose  [employer], Pharm.D., BCPS Clinical Pharmacist Pager [phone] 02/21/2014 9:25 AM

## 2014-02-22 ENCOUNTER — Other Ambulatory Visit (HOSPITAL_COMMUNITY): Payer: Self-pay

## 2014-02-22 ENCOUNTER — Ambulatory Visit: Payer: Medicare Other | Admitting: Family

## 2014-02-22 ENCOUNTER — Encounter (HOSPITAL_COMMUNITY): Payer: Medicare Other

## 2014-02-25 ENCOUNTER — Ambulatory Visit (INDEPENDENT_AMBULATORY_CARE_PROVIDER_SITE_OTHER): Payer: Medicare Other | Admitting: Pharmacist

## 2014-02-25 DIAGNOSIS — Z7901 Long term (current) use of anticoagulants: Secondary | ICD-10-CM

## 2014-02-25 DIAGNOSIS — I743 Embolism and thrombosis of arteries of the lower extremities: Secondary | ICD-10-CM

## 2014-02-25 DIAGNOSIS — I779 Disorder of arteries and arterioles, unspecified: Secondary | ICD-10-CM

## 2014-02-25 LAB — POCT INR: INR: 1.7

## 2014-02-25 MED ORDER — WARFARIN SODIUM 4 MG PO TABS: ORAL_TABLET | ORAL | Status: DC

## 2014-02-25 NOTE — Patient Instructions (Signed)
Patient instructed to take medications as defined in the Anti-coagulation Track section of this encounter.  Patient instructed to take today's dose.  Patient verbalized understanding of these instructions.    

## 2014-02-25 NOTE — Progress Notes (Signed)
Indication: Left fem-pop graft thrombosis. Duration: Lifelong. INR: Below target. Agree with Dr. Gladstone Pih assessment and plan.

## 2014-02-25 NOTE — Progress Notes (Signed)
Anti-Coagulation Progress Note  Kristin Knight is a 68 y.o. female who is currently on an anti-coagulation regimen.    RECENT RESULTS: Recent results are below, the most recent result is correlated with a dose of 34 mg. 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 INR on admission was recorded at 2.03  I discussed with the patient that given that she had this event with a therapeutic INR that perhaps we will target 2.5 - 3.0  She did indicate nearing completion of interview that she had missed some dose leading up to the event. Discussed importance of compliance with regimen as prescribed and not "running out"--which she states she had done.  Lab Results  Component Value Date   INR 1.70 02/25/2014   INR 2.02* 02/21/2014   INR 1.66* 02/20/2014    ANTI-COAG DOSE: Anticoagulation Dose Instructions as of 02/25/2014     [name] Tue Wed Thu Fri Sat   New Dose 4 mg 6 mg 6 mg 6 mg 6 mg 6 mg 6 mg       ANTICOAG SUMMARY: Anticoagulation Episode Summary   Current INR goal 2.0-3.0  Next INR check 03/11/2014  INR from last check 1.70! 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  Patient instructed to take today's dose.  Patient verbalized understanding of these instructions.       FOLLOW-UP Return in 2 weeks (on 03/11/2014) for Follow up INR at [place], III Pharm.D., CACP

## 2014-02-25 NOTE — Addendum Note (Signed)
Addended by: Jorene Guest B on: 02/25/2014 03:34 PM   Modules accepted: Orders

## 2014-03-01 ENCOUNTER — Ambulatory Visit (INDEPENDENT_AMBULATORY_CARE_PROVIDER_SITE_OTHER): Payer: Medicare Other | Admitting: Internal Medicine

## 2014-03-01 ENCOUNTER — Encounter: Payer: Self-pay | Admitting: Internal Medicine

## 2014-03-01 VITALS — BP 112/68 | HR 63 | Temp 98.4°F | Wt 106.7 lb

## 2014-03-01 DIAGNOSIS — Z7901 Long term (current) use of anticoagulants: Secondary | ICD-10-CM | POA: Diagnosis not present

## 2014-03-01 DIAGNOSIS — F331 Major depressive disorder, recurrent, moderate: Secondary | ICD-10-CM | POA: Diagnosis not present

## 2014-03-01 DIAGNOSIS — I10 Essential (primary) hypertension: Secondary | ICD-10-CM

## 2014-03-01 DIAGNOSIS — M62838 Other muscle spasm: Secondary | ICD-10-CM | POA: Diagnosis not present

## 2014-03-01 DIAGNOSIS — I743 Embolism and thrombosis of arteries of the lower extremities: Secondary | ICD-10-CM | POA: Diagnosis not present

## 2014-03-01 DIAGNOSIS — E441 Mild protein-calorie malnutrition: Secondary | ICD-10-CM | POA: Diagnosis not present

## 2014-03-01 DIAGNOSIS — Z720 Tobacco use: Secondary | ICD-10-CM

## 2014-03-01 DIAGNOSIS — T82898A Other specified complication of vascular prosthetic devices, implants and grafts, initial encounter: Secondary | ICD-10-CM

## 2014-03-01 DIAGNOSIS — Z1231 Encounter for screening mammogram for malignant neoplasm of breast: Secondary | ICD-10-CM

## 2014-03-01 DIAGNOSIS — T82868A Thrombosis of vascular prosthetic devices, implants and grafts, initial encounter: Secondary | ICD-10-CM

## 2014-03-01 DIAGNOSIS — I739 Peripheral vascular disease, unspecified: Secondary | ICD-10-CM | POA: Diagnosis not present

## 2014-03-01 DIAGNOSIS — Z48812 Encounter for surgical aftercare following surgery on the circulatory system: Secondary | ICD-10-CM | POA: Diagnosis not present

## 2014-03-01 DIAGNOSIS — Z9889 Other specified postprocedural states: Secondary | ICD-10-CM | POA: Diagnosis not present

## 2014-03-01 DIAGNOSIS — Y849 Medical procedure, unspecified as the cause of abnormal reaction of the patient, or of later complication, without mention of misadventure at the time of the procedure: Secondary | ICD-10-CM

## 2014-03-01 DIAGNOSIS — M8580 Other specified disorders of bone density and structure, unspecified site: Secondary | ICD-10-CM

## 2014-03-01 DIAGNOSIS — Z0000 Encounter for general adult medical examination without abnormal findings: Secondary | ICD-10-CM

## 2014-03-01 DIAGNOSIS — F17200 Nicotine dependence, unspecified, uncomplicated: Secondary | ICD-10-CM | POA: Diagnosis not present

## 2014-03-01 MED ORDER — MIRTAZAPINE 15 MG PO TABS: 15.0000 mg | ORAL_TABLET | Freq: Every day | ORAL | Status: DC

## 2014-03-01 NOTE — Assessment & Plan Note (Signed)
She continues to smoke. She was unable to afford the nicotine patches since it was not covered by her insurance company but had to be paid for out of pocket because it was over-the-counter. We gave her the quit line number to call to see if they have any patches they could give her. [employer] states they will cover Chantix. We will first try the nicotine patches if she can obtain them, and work on her major depression in the interim. If she is unable to obtain nicotine patches, and her depression is much better controlled on the mirtazapine, we will consider Chantix at the followup appointment.

## 2014-03-01 NOTE — Assessment & Plan Note (Signed)
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 She admits to missing a few doses of Coumadin before the thrombotic event. Fortunately, the thrombosis was resolved using intra-arterial thrombolytics, thus avoiding a thrombectomy. She knows the importance of continuing the Coumadin, as well as smoking cessation. 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

## 2014-03-01 NOTE — Assessment & Plan Note (Signed)
She stopped the Prozac as it was not effective, even at the higher dose. She has also failed Paxil in the past. Given her associated insomnia and weight loss we will try mirtazapine 15 mg by mouth at bedtime. If this dose is ineffective at controlling her depression, insomnia, and weight loss we will increase the dose at the followup visit. She also feels the counseling she has received is helpful and will reschedule the next appointment since she missed it because of the graft thrombosis that she suffered earlier this month.

## 2014-03-01 NOTE — Progress Notes (Signed)
   Subjective:    Patient ID: Kristin Knight, female    DOB: Feb 09, 1946, 68 y.o.   MRN: [id number]  HPI  Please see the A&P for the status of the pt's chronic medical problems.  Review of Systems  Constitutional: Negative for activity change, appetite change and unexpected weight change.  Respiratory: Negative for shortness of breath and wheezing.   Cardiovascular: Negative for chest pain, palpitations and leg swelling.  Gastrointestinal: Negative for nausea, vomiting, abdominal pain, diarrhea, constipation and abdominal distention.  Musculoskeletal: Positive for myalgias, neck pain and neck stiffness. Negative for arthralgias, back pain, gait problem and joint swelling.  Skin: Negative for color change, rash and wound.  Neurological: Negative for dizziness, syncope, weakness and light-headedness.  Psychiatric/Behavioral: Positive for dysphoric mood.      Objective:   Physical Exam  Nursing note and vitals reviewed. Constitutional: She is oriented to person, place, and time. She appears well-developed. No distress.  HENT:  Head: Normocephalic and atraumatic.  Eyes: Conjunctivae are normal. Right eye exhibits no discharge. Left eye exhibits no discharge. No scleral icterus.  Cardiovascular: Normal rate, regular rhythm and normal heart sounds.  Exam reveals no gallop and no friction rub.   No murmur heard. Pulmonary/Chest: Effort normal and breath sounds normal. No respiratory distress. She has no wheezes. She has no rales.  Abdominal: Soft. Bowel sounds are normal. She exhibits no distension. There is no tenderness. There is no rebound and no guarding.  Musculoskeletal: Normal range of motion. She exhibits no edema and no tenderness.  Neurological: She is alert and oriented to person, place, and time. She exhibits normal muscle tone.  Skin: Skin is warm and dry. No rash noted. She is not diaphoretic. No erythema.  Psychiatric: She has a normal mood and affect. Her behavior is normal.  Judgment and thought content normal.      Assessment & Plan:   Please see problem oriented charting.

## 2014-03-01 NOTE — Assessment & Plan Note (Signed)
She is due for a DEXA scan and a screening mammography. She is interested in having these screening procedures. The DEXA scan was entered and signed. The mammography order is currently resisting acceptance of the signature secondary to the linked diagnosis. We will investigate the reason for this and place the mammography referral next week.

## 2014-03-01 NOTE — Assessment & Plan Note (Signed)
Her blood pressure today is 112/68 which is well within target while taking the lisinopril 20 mg by mouth daily. She is also on triamterene-hydrochlorothiazide 37.5-25 mg per mouth daily. We will continue this antihypertensive regimen given that is effective in managing her blood pressure.

## 2014-03-01 NOTE — Assessment & Plan Note (Signed)
She continues to have problems with neck pain and spasms particularly on the left side of her posterior neck. Examination is notable for significant muscle spasm and hypertrophy in this area. She has failed a muscle relaxant in the past but states that the tramadol 100 mg as needed is effective. She did not pick up the Tylenol in the past. I encouraged her to continue the tramadol as needed and to try Tylenol 1000 mg by mouth up to 4 times a day. We will reassess the effectiveness of this regimen at the followup visit.

## 2014-03-01 NOTE — Assessment & Plan Note (Signed)
Her weight is down 2 pounds from the last clinic visit but is up over 15 pounds from her low. She is feeling much better from a depression standpoint with an improved appetite. I suspect we can get further improvement in her body mass index and weight with the addition of mirtazapine 15 mg by mouth at night. We will reassess her weight at the followup on the mirtazapine and with continued counseling for her depression.

## 2014-03-01 NOTE — Patient Instructions (Signed)
It was great to see you again!  1) We will give you information on the Smokers Quit Line.  They may be able to provide you with patches.  We will also contact [employer] and see if they will cover anything to help with the smoking.  Stopping smoking is probably the most important thing we can do for your health.  2) Keep taking all of your other medications as you are.  3) Try over the counter tylenol for your neck pain.  You can take up to 1000 mg four times a day.  Do not ever take more than that in a day.  4) I placed requests for a mammogram and a bone scan, both of which are due and recommended.    5) We will try to get mirtazapine approved.  This is for your depression, weight loss, and difficulty sleeping.  6) I will see you back in 3 months, sooner if necessary.

## 2014-03-04 NOTE — Addendum Note (Signed)
Addended by: Marcelino Duster on: 03/04/2014 12:01 PM   Modules accepted: Orders

## 2014-03-04 NOTE — Addendum Note (Signed)
Addended by: Marcelino Duster on: 03/04/2014 03:24 PM   Modules accepted: Orders

## 2014-03-07 ENCOUNTER — Encounter: Payer: Self-pay | Admitting: Family

## 2014-03-08 ENCOUNTER — Ambulatory Visit (HOSPITAL_COMMUNITY)
Admission: RE | Admit: 2014-03-08 | Discharge: 2014-03-08 | Disposition: A | Discharge status: Home / Self Care | Payer: Medicare Other | Source: Ambulatory Visit | Attending: Family | Admitting: Family

## 2014-03-08 ENCOUNTER — Ambulatory Visit (INDEPENDENT_AMBULATORY_CARE_PROVIDER_SITE_OTHER): Payer: Medicare Other | Admitting: Family

## 2014-03-08 ENCOUNTER — Encounter: Payer: Self-pay | Admitting: Family

## 2014-03-08 VITALS — BP 119/80 | HR 58 | Resp 14 | Ht 64.0 in | Wt 108.0 lb

## 2014-03-08 DIAGNOSIS — Z48812 Encounter for surgical aftercare following surgery on the circulatory system: Secondary | ICD-10-CM

## 2014-03-08 DIAGNOSIS — M7989 Other specified soft tissue disorders: Secondary | ICD-10-CM

## 2014-03-08 DIAGNOSIS — I739 Peripheral vascular disease, unspecified: Secondary | ICD-10-CM

## 2014-03-08 NOTE — Patient Instructions (Signed)
Peripheral Vascular Disease Peripheral Vascular Disease (PVD), also called Peripheral Arterial Disease (PAD), is a circulation problem caused by cholesterol (atherosclerotic plaque) deposits in the arteries. PVD commonly occurs in the lower extremities (legs) but it can occur in other areas of the body, such as your arms. The cholesterol buildup in the arteries reduces blood flow which can cause pain and other serious problems. The presence of PVD can place a person at risk for Coronary Artery Disease (CAD).  CAUSES  Causes of PVD can be many. It is usually associated with more than one risk factor such as:   High Cholesterol.  Smoking.  Diabetes.  Lack of exercise or inactivity.  High blood pressure (hypertension).  Obesity.  Family history. SYMPTOMS   When the lower extremities are affected, patients with PVD may experience:  Leg pain with exertion or physical activity. This is called INTERMITTENT CLAUDICATION. This may present as cramping or numbness with physical activity. The location of the pain is associated with the level of blockage. For example, blockage at the abdominal level (distal abdominal aorta) may result in buttock or hip pain. Lower leg arterial blockage may result in calf pain.  As PVD becomes more severe, pain can develop with less physical activity.  In people with severe PVD, leg pain may occur at rest.  Other PVD signs and symptoms:  Leg numbness or weakness.  Coldness in the affected leg or foot, especially when compared to the other leg.  A change in leg color.  Patients with significant PVD are more prone to ulcers or sores on toes, feet or legs. These may take longer to heal or may reoccur. The ulcers or sores can become infected.  If signs and symptoms of PVD are ignored, gangrene may occur. This can result in the loss of toes or loss of an entire limb.  Not all leg pain is related to PVD. Other medical conditions can cause leg pain such  as:  Blood clots (embolism) or Deep Vein Thrombosis.  Inflammation of the blood vessels (vasculitis).  Spinal stenosis. DIAGNOSIS  Diagnosis of PVD can involve several different types of tests. These can include:  Pulse Volume Recording Method (PVR). This test is simple, painless and does not involve the use of X-rays. PVR involves measuring and comparing the blood pressure in the arms and legs. An ABI (Ankle-Brachial Index) is calculated. The normal ratio of blood pressures is 1. As this number becomes smaller, it indicates more severe disease.  < 0.95  indicates significant narrowing in one or more leg vessels.  <0.8 there will usually be pain in the foot, leg or buttock with exercise.  <0.4 will usually have pain in the legs at rest.  <0.25  usually indicates limb threatening PVD.  Doppler detection of pulses in the legs. This test is painless and checks to see if you have a pulses in your legs/feet.  A dye or contrast material (a substance that highlights the blood vessels so they show up on x-ray) may be given to help your caregiver better see the arteries for the following tests. The dye is eliminated from your body by the kidney's. Your caregiver may order blood work to check your kidney function and other laboratory values before the following tests are performed:  Magnetic Resonance Angiography (MRA). An MRA is a picture study of the blood vessels and arteries. The MRA machine uses a large magnet to produce images of the blood vessels.  Computed Tomography Angiography (CTA). A CTA is a   specialized x-ray that looks at how the blood flows in your blood vessels. An IV may be inserted into your arm so contrast dye can be injected.  Angiogram. Is a procedure that uses x-rays to look at your blood vessels. This procedure is minimally invasive, meaning a small incision (cut) is made in your groin. A small tube (catheter) is then inserted into the artery of your groin. The catheter is  guided to the blood vessel or artery your caregiver wants to examine. Contrast dye is injected into the catheter. X-rays are then taken of the blood vessel or artery. After the images are obtained, the catheter is taken out. TREATMENT  Treatment of PVD involves many interventions which may include:  Lifestyle changes:  Quitting smoking.  Exercise.  Following a low fat, low cholesterol diet.  Control of diabetes.  Foot care is very important to the PVD patient. Good foot care can help prevent infection.  Medication:  Cholesterol-lowering medicine.  Blood pressure medicine.  Anti-platelet drugs.  Certain medicines may reduce symptoms of Intermittent Claudication.  Interventional/Surgical options:  Angioplasty. An Angioplasty is a procedure that inflates a balloon in the blocked artery. This opens the blocked artery to improve blood flow.  Stent Implant. A wire mesh tube (stent) is placed in the artery. The stent expands and stays in place, allowing the artery to remain open.  Peripheral Bypass Surgery. This is a surgical procedure that reroutes the blood around a blocked artery to help improve blood flow. This type of procedure may be performed if Angioplasty or stent implants are not an option. SEEK IMMEDIATE MEDICAL CARE IF:   You develop pain or numbness in your arms or legs.  Your arm or leg turns cold, becomes blue in color.  You develop redness, warmth, swelling and pain in your arms or legs. MAKE SURE YOU:   Understand these instructions.  Will watch your condition.  Will get help right away if you are not doing well or get worse. Document Released: 12/09/2004 Document Revised: 01/24/2012 Document Reviewed: 11/05/2008 Exit[employer] Patient Information 2014 [employer].   Smoking Cessation Quitting smoking is important to your health and has many advantages. However, it is not always easy to quit since nicotine is a very addictive drug. Often times, people try 3  times or more before being able to quit. This document explains the best ways for you to prepare to quit smoking. Quitting takes hard work and a lot of effort, but you can do it. ADVANTAGES OF QUITTING SMOKING  You will live longer, feel better, and live better.  Your body will feel the impact of quitting smoking almost immediately.  Within 20 minutes, blood pressure decreases. Your pulse returns to its normal level.  After 8 hours, carbon monoxide levels in the blood return to normal. Your oxygen level increases.  After 24 hours, the chance of having a heart attack starts to decrease. Your breath, hair, and body stop smelling like smoke.  After 48 hours, damaged nerve endings begin to recover. Your sense of taste and smell improve.  After 72 hours, the body is virtually free of nicotine. Your bronchial tubes relax and breathing becomes easier.  After 2 to 12 weeks, lungs can hold more air. Exercise becomes easier and circulation improves.  The risk of having a heart attack, stroke, cancer, or lung disease is greatly reduced.  After 1 year, the risk of coronary heart disease is cut in half.  After 5 years, the risk of stroke falls to   the same as a nonsmoker.  After 10 years, the risk of lung cancer is cut in half and the risk of other cancers decreases significantly.  After 15 years, the risk of coronary heart disease drops, usually to the level of a nonsmoker.  If you are pregnant, quitting smoking will improve your chances of having a healthy baby.  The people you live with, especially any children, will be healthier.  You will have extra money to spend on things other than cigarettes. QUESTIONS TO THINK ABOUT BEFORE ATTEMPTING TO QUIT You may want to talk about your answers with your caregiver.  Why do you want to quit?  If you tried to quit in the past, what helped and what did not?  What will be the most difficult situations for you after you quit? How will you plan to  handle them?  Who can help you through the tough times? Your family? Friends? A caregiver?  What pleasures do you get from smoking? What ways can you still get pleasure if you quit? Here are some questions to ask your caregiver:  How can you help me to be successful at quitting?  What medicine do you think would be best for me and how should I take it?  What should I do if I need more help?  What is smoking withdrawal like? How can I get information on withdrawal? GET READY  Set a quit date.  Change your environment by getting rid of all cigarettes, ashtrays, matches, and lighters in your home, car, or work. Do not let people smoke in your home.  Review your past attempts to quit. Think about what worked and what did not. GET SUPPORT AND ENCOURAGEMENT You have a better chance of being successful if you have help. You can get support in many ways.  Tell your family, friends, and co-workers that you are going to quit and need their support. Ask them not to smoke around you.  Get individual, group, or telephone counseling and support. Programs are available at local hospitals and health centers. Call your local health department for information about programs in your area.  Spiritual beliefs and practices may help some smokers quit.  Download a "quit meter" on your computer to keep track of quit statistics, such as how long you have gone without smoking, cigarettes not smoked, and money saved.  Get a self-help book about quitting smoking and staying off of tobacco. LEARN NEW SKILLS AND BEHAVIORS  Distract yourself from urges to smoke. Talk to someone, go for a walk, or occupy your time with a task.  Change your normal routine. Take a different route to work. Drink tea instead of coffee. Eat breakfast in a different place.  Reduce your stress. Take a hot bath, exercise, or read a book.  Plan something enjoyable to do every day. Reward yourself for not smoking.  Explore  interactive web-based programs that specialize in helping you quit. GET MEDICINE AND USE IT CORRECTLY Medicines can help you stop smoking and decrease the urge to smoke. Combining medicine with the above behavioral methods and support can greatly increase your chances of successfully quitting smoking.  Nicotine replacement therapy helps deliver nicotine to your body without the negative effects and risks of smoking. Nicotine replacement therapy includes nicotine gum, lozenges, inhalers, nasal sprays, and skin patches. Some may be available over-the-counter and others require a prescription.  Antidepressant medicine helps people abstain from smoking, but how this works is unknown. This medicine is available by prescription.    Nicotinic receptor partial agonist medicine simulates the effect of nicotine in your brain. This medicine is available by prescription. Ask your caregiver for advice about which medicines to use and how to use them based on your health history. Your caregiver will tell you what side effects to look out for if you choose to be on a medicine or therapy. Carefully read the information on the package. Do not use any other product containing nicotine while using a nicotine replacement product.  RELAPSE OR DIFFICULT SITUATIONS Most relapses occur within the first 3 months after quitting. Do not be discouraged if you start smoking again. Remember, most people try several times before finally quitting. You may have symptoms of withdrawal because your body is used to nicotine. You may crave cigarettes, be irritable, feel very hungry, cough often, get headaches, or have difficulty concentrating. The withdrawal symptoms are only temporary. They are strongest when you first quit, but they will go away within 10 14 days. To reduce the chances of relapse, try to:  Avoid drinking alcohol. Drinking lowers your chances of successfully quitting.  Reduce the amount of caffeine you consume. Once you  quit smoking, the amount of caffeine in your body increases and can give you symptoms, such as a rapid heartbeat, sweating, and anxiety.  Avoid smokers because they can make you want to smoke.  Do not let weight gain distract you. Many smokers will gain weight when they quit, usually less than 10 pounds. Eat a healthy diet and stay active. You can always lose the weight gained after you quit.  Find ways to improve your mood other than smoking. FOR MORE INFORMATION  www.smokefree.gov  Document Released: 10/26/2001 Document Revised: 05/02/2012 Document Reviewed: 02/10/2012 [employer] Patient Information 2014 [employer].  
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
## 2014-03-11 ENCOUNTER — Ambulatory Visit: Payer: Self-pay

## 2014-03-12 ENCOUNTER — Ambulatory Visit (HOSPITAL_COMMUNITY): Admission: RE | Admit: 2014-03-12 | Payer: Medicare Other | Source: Ambulatory Visit

## 2014-03-12 ENCOUNTER — Ambulatory Visit (HOSPITAL_COMMUNITY): Payer: Medicare Other

## 2014-03-14 ENCOUNTER — Telehealth: Payer: Self-pay | Admitting: Internal Medicine

## 2014-03-14 NOTE — Telephone Encounter (Signed)
Received a prescription for a lower back brace.  We had not discussed any lower back problems at our recent visit so I called Kristin Knight to inquire about the reasons for this request.  Apparently, she is having lower back pain that is central in nature.  There are no associated neurologic symptoms.  As this pain has not been relieved by analgesics she was wanting to try a back brace.  This is reasonable and I signed the prescription.  The phone number in the computer is wrong and I suspect she missed her mammogram and DEXA scan because of this.  Her phone number is [phone].  We will reschedule the mammogram and DEXA scan and fix the phone number int he chart.  The reason for the DEXA is my concern for osteoporosis and the cause of the back pain could represent an osteoporotic fracture.  Obviously, this would impact therapy.

## 2014-03-18 ENCOUNTER — Ambulatory Visit (INDEPENDENT_AMBULATORY_CARE_PROVIDER_SITE_OTHER): Payer: Medicare Other | Admitting: Pharmacist

## 2014-03-18 DIAGNOSIS — Z7901 Long term (current) use of anticoagulants: Secondary | ICD-10-CM

## 2014-03-18 DIAGNOSIS — I779 Disorder of arteries and arterioles, unspecified: Secondary | ICD-10-CM

## 2014-03-18 DIAGNOSIS — I743 Embolism and thrombosis of arteries of the lower extremities: Secondary | ICD-10-CM | POA: Diagnosis not present

## 2014-03-18 LAB — POCT INR: INR: 2.2

## 2014-03-18 NOTE — Progress Notes (Signed)
Anti-Coagulation Progress Note  Kristin Knight is a 68 y.o. female who is currently on an anti-coagulation regimen.    RECENT RESULTS: Recent results are below, the most recent result is correlated with a dose of 40 mg. per week: Lab Results  Component Value Date   INR 2.20 03/18/2014   INR 1.70 02/25/2014   INR 2.02* 02/21/2014    ANTI-COAG DOSE: Anticoagulation Dose Instructions as of 03/18/2014     [name] Tue Wed Thu Fri Sat   New Dose 6 mg 6 mg 6 mg 6 mg 6 mg 6 mg 6 mg       ANTICOAG SUMMARY: Anticoagulation Episode Summary   Current INR goal 2.0-3.0  Next INR check 04/22/2014  INR from last check 2.20 (03/18/2014)  Weekly max dose   Target end date   [facility]  Preferred lab   Send INR reminders to    Indications  Peripheral arterial occlusive disease [444.22] Long term (current) use of anticoagulants [V58.61]        Comments         ANTICOAG TODAY: Anticoagulation Summary as of 03/18/2014   INR goal 2.0-3.0  Selected INR 2.20 (03/18/2014)  Next INR check 04/22/2014  Target end date    Indications  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  Patient instructed to take today's dose.  Patient verbalized understanding of these instructions.       FOLLOW-UP Return in 5 weeks (on 04/22/2014) for Follow up INR at 1415h.  Jorene Guest, III Pharm.D., CACP

## 2014-03-18 NOTE — Patient Instructions (Signed)
Patient instructed to take medications as defined in the Anti-coagulation Track section of this encounter.  Patient instructed to take today's dose.  Patient verbalized understanding of these instructions.    

## 2014-03-21 ENCOUNTER — Ambulatory Visit (HOSPITAL_COMMUNITY): Payer: Medicare Other

## 2014-03-22 ENCOUNTER — Ambulatory Visit (HOSPITAL_COMMUNITY): Payer: Medicare Other

## 2014-03-27 ENCOUNTER — Ambulatory Visit (HOSPITAL_COMMUNITY)
Admission: RE | Admit: 2014-03-27 | Discharge: 2014-03-27 | Disposition: A | Discharge status: Home / Self Care | Payer: Medicare Other | Source: Ambulatory Visit | Attending: Internal Medicine | Admitting: Internal Medicine

## 2014-03-27 DIAGNOSIS — M810 Age-related osteoporosis without current pathological fracture: Secondary | ICD-10-CM

## 2014-03-27 DIAGNOSIS — Z1231 Encounter for screening mammogram for malignant neoplasm of breast: Secondary | ICD-10-CM | POA: Insufficient documentation

## 2014-03-27 DIAGNOSIS — M899 Disorder of bone, unspecified: Secondary | ICD-10-CM | POA: Insufficient documentation

## 2014-03-27 DIAGNOSIS — F17200 Nicotine dependence, unspecified, uncomplicated: Secondary | ICD-10-CM | POA: Diagnosis not present

## 2014-03-27 DIAGNOSIS — E46 Unspecified protein-calorie malnutrition: Secondary | ICD-10-CM | POA: Insufficient documentation

## 2014-03-27 DIAGNOSIS — Z780 Asymptomatic menopausal state: Secondary | ICD-10-CM | POA: Diagnosis not present

## 2014-03-27 DIAGNOSIS — M949 Disorder of cartilage, unspecified: Secondary | ICD-10-CM

## 2014-03-27 DIAGNOSIS — Z13820 Encounter for screening for osteoporosis: Secondary | ICD-10-CM | POA: Insufficient documentation

## 2014-03-27 DIAGNOSIS — M8580 Other specified disorders of bone density and structure, unspecified site: Secondary | ICD-10-CM

## 2014-03-27 HISTORY — DX: Age-related osteoporosis without current pathological fracture: M81.0

## 2014-04-16 ENCOUNTER — Encounter: Payer: Self-pay | Admitting: Internal Medicine

## 2014-04-16 DIAGNOSIS — M810 Age-related osteoporosis without current pathological fracture: Secondary | ICD-10-CM

## 2014-04-16 MED ORDER — ALENDRONATE SODIUM 70 MG PO TABS
70.0000 mg | ORAL_TABLET | ORAL | Status: DC
Start: 2014-04-16 — End: 2014-07-25

## 2014-04-16 MED ORDER — CALCIUM CITRATE-VITAMIN D 315-200 MG-UNIT PO TABS: 2.0000 | ORAL_TABLET | Freq: Two times a day (BID) | ORAL | Status: DC

## 2014-04-16 NOTE — Progress Notes (Signed)
I called Kristin Knight to discuss the results of her recent DEXA scan and mammogram.  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  She qualifies for vitamin D, calcium, and alendronate.  She is interested in these therapies and prescriptions were sent to her pharmacy.  Her recent mammogram was BiRADS 1 and she was informed of the good news.  She is now interested in going to the gym and this was encouraged, although she was asked to initially take it easy.  She also informs me that she is much less depressed, has a much better appetite, and has gained 12 pounds since our last visit.  This is wonderful news and she was praised for her improved health.

## 2014-04-22 ENCOUNTER — Ambulatory Visit: Payer: Self-pay

## 2014-05-06 ENCOUNTER — Ambulatory Visit (INDEPENDENT_AMBULATORY_CARE_PROVIDER_SITE_OTHER): Payer: Medicare Other | Admitting: Pharmacist

## 2014-05-06 DIAGNOSIS — I779 Disorder of arteries and arterioles, unspecified: Secondary | ICD-10-CM

## 2014-05-06 DIAGNOSIS — I743 Embolism and thrombosis of arteries of the lower extremities: Secondary | ICD-10-CM

## 2014-05-06 DIAGNOSIS — Z7901 Long term (current) use of anticoagulants: Secondary | ICD-10-CM

## 2014-05-06 LAB — POCT INR: INR: 2.3

## 2014-05-06 NOTE — Patient Instructions (Signed)
Patient instructed to take medications as defined in the Anti-coagulation Track section of this encounter.  Patient instructed to take today's dose.  Patient verbalized understanding of these instructions.    

## 2014-05-06 NOTE — Progress Notes (Signed)
INTERNAL MEDICINE TEACHING ATTENDING ADDENDUM - Aldine Contes M.D  Duration- indefinte, Indication- PAD, INR- therapeutic. Agree with Dr. Gladstone Pih recommendations as outlined in his note.

## 2014-05-06 NOTE — Progress Notes (Signed)
Anti-Coagulation Progress Note  Kristin Knight is a 68 y.o. female who is currently on an anti-coagulation regimen.    RECENT RESULTS: Recent results are below, the most recent result is correlated with a dose of 42 mg. per week: Lab Results  Component Value Date   INR 2.30 05/06/2014   INR 2.20 03/18/2014   INR 1.70 02/25/2014    ANTI-COAG DOSE: Anticoagulation Dose Instructions as of 05/06/2014     [name] Tue Wed Thu Fri Sat   New Dose 6 mg 6 mg 6 mg 6 mg 6 mg 6 mg 6 mg       ANTICOAG SUMMARY: Anticoagulation Episode Summary   Current INR goal 2.0-3.0  Next INR check 06/03/2014  INR from last check 2.30 (05/06/2014)  Weekly max dose   Target end date   [facility]  Preferred lab   Send INR reminders to    Indications  Peripheral arterial occlusive disease [444.22] Long term (current) use of anticoagulants [V58.61]        Comments         ANTICOAG TODAY: Anticoagulation Summary as of 05/06/2014   INR goal 2.0-3.0  Selected INR 2.30 (05/06/2014)  Next INR check 06/03/2014  Target end date    Indications  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  Patient instructed to take today's dose.  Patient verbalized understanding of these instructions.       FOLLOW-UP Return for Follow up INR at 0900h.  Jorene Guest, III Pharm.D., CACP

## 2014-05-29 ENCOUNTER — Other Ambulatory Visit: Payer: Self-pay | Admitting: Internal Medicine

## 2014-05-29 DIAGNOSIS — T82868S Thrombosis of vascular prosthetic devices, implants and grafts, sequela: Secondary | ICD-10-CM

## 2014-06-03 ENCOUNTER — Ambulatory Visit (INDEPENDENT_AMBULATORY_CARE_PROVIDER_SITE_OTHER): Payer: Medicare Other | Admitting: Pharmacist

## 2014-06-03 DIAGNOSIS — I779 Disorder of arteries and arterioles, unspecified: Secondary | ICD-10-CM

## 2014-06-03 DIAGNOSIS — Z7901 Long term (current) use of anticoagulants: Secondary | ICD-10-CM

## 2014-06-03 DIAGNOSIS — I743 Embolism and thrombosis of arteries of the lower extremities: Secondary | ICD-10-CM

## 2014-06-03 LAB — POCT INR: INR: 4.6

## 2014-06-03 NOTE — Progress Notes (Signed)
Anti-Coagulation Progress Note  [name] is a 68 y.o. female who is currently on an anti-coagulation regimen.    RECENT RESULTS: Recent results are below, the most recent result is correlated with a dose of 42 mg. per week: Lab Results  Component Value Date   INR 4.60 06/03/2014   INR 2.30 05/06/2014   INR 2.20 03/18/2014    ANTI-COAG DOSE: Anticoagulation Dose Instructions as of 06/03/2014     [name] Tue Wed Thu Fri Sat   New Dose 6 mg 6 mg 4 mg 6 mg 4 mg 6 mg 4 mg       ANTICOAG SUMMARY: Anticoagulation Episode Summary   Current INR goal 2.0-3.0  Next INR check 06/24/2014  INR from last check 4.60! (06/03/2014)  Weekly max dose   Target end date   [facility]  Preferred lab   Send INR reminders to    Indications  Peripheral arterial occlusive disease [444.22] Long term (current) use of anticoagulants [V58.61]        Comments         ANTICOAG TODAY: Anticoagulation Summary as of 06/03/2014   INR goal 2.0-3.0  Selected INR 4.60! (06/03/2014)  Next INR check 06/24/2014  Target end date    Indications  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  Patient instructed to OMIT today's dose.  Patient verbalized understanding of these instructions.       FOLLOW-UP Return in 3 weeks (on 06/24/2014) for Follow up INR at 0915h.  Jorene Guest, III Pharm.D., CACP

## 2014-06-03 NOTE — Patient Instructions (Signed)
Patient instructed to take medications as defined in the Anti-coagulation Track section of this encounter.  Patient instructed to OMIT today's dose.  Patient verbalized understanding of these instructions.    

## 2014-06-04 ENCOUNTER — Other Ambulatory Visit: Payer: Self-pay | Admitting: Internal Medicine

## 2014-06-04 DIAGNOSIS — I10 Essential (primary) hypertension: Secondary | ICD-10-CM

## 2014-06-06 ENCOUNTER — Encounter: Payer: Self-pay | Admitting: Family

## 2014-06-07 ENCOUNTER — Ambulatory Visit: Payer: Self-pay | Admitting: Family

## 2014-06-07 ENCOUNTER — Inpatient Hospital Stay (HOSPITAL_COMMUNITY): Admission: RE | Admit: 2014-06-07 | Payer: Self-pay | Source: Ambulatory Visit

## 2014-06-20 ENCOUNTER — Encounter (HOSPITAL_COMMUNITY): Payer: Self-pay | Admitting: Psychology

## 2014-06-20 ENCOUNTER — Encounter: Payer: Self-pay | Admitting: Family

## 2014-06-20 DIAGNOSIS — F331 Major depressive disorder, recurrent, moderate: Secondary | ICD-10-CM

## 2014-06-20 NOTE — Progress Notes (Signed)
Kristin Knight is a 68 y.o. female patient discharged from counseling as not active since 10/08/13.  Outpatient Therapist Discharge Summary  Hudson Lehmkuhl    [birth year]   Admission Date: 09/04/13   Discharge Date:  06/20/14 Reason for Discharge:  Not active- didn't return for services Diagnosis:    Major depressive disorder, recurrent, moderate   Comments:  No show on 10/30/13  Delle Reining, [facility]

## 2014-06-21 ENCOUNTER — Ambulatory Visit (HOSPITAL_COMMUNITY)
Admission: RE | Admit: 2014-06-21 | Discharge: 2014-06-21 | Disposition: A | Discharge status: Home / Self Care | Payer: Medicare Other | Source: Ambulatory Visit | Attending: Family | Admitting: Family

## 2014-06-21 ENCOUNTER — Other Ambulatory Visit: Payer: Self-pay | Admitting: Vascular Surgery

## 2014-06-21 ENCOUNTER — Encounter: Payer: Self-pay | Admitting: Family

## 2014-06-21 ENCOUNTER — Ambulatory Visit (INDEPENDENT_AMBULATORY_CARE_PROVIDER_SITE_OTHER): Payer: Medicare Other | Admitting: Family

## 2014-06-21 VITALS — BP 135/83 | HR 55 | Resp 14 | Ht 64.0 in | Wt 131.0 lb

## 2014-06-21 DIAGNOSIS — I739 Peripheral vascular disease, unspecified: Secondary | ICD-10-CM | POA: Diagnosis not present

## 2014-06-21 DIAGNOSIS — M7989 Other specified soft tissue disorders: Secondary | ICD-10-CM

## 2014-06-21 DIAGNOSIS — R252 Cramp and spasm: Secondary | ICD-10-CM

## 2014-06-21 DIAGNOSIS — I743 Embolism and thrombosis of arteries of the lower extremities: Secondary | ICD-10-CM

## 2014-06-21 DIAGNOSIS — Z48812 Encounter for surgical aftercare following surgery on the circulatory system: Secondary | ICD-10-CM

## 2014-06-21 NOTE — Patient Instructions (Signed)
Peripheral Vascular Disease Peripheral Vascular Disease (PVD), also called Peripheral Arterial Disease (PAD), is a circulation problem caused by cholesterol (atherosclerotic plaque) deposits in the arteries. PVD commonly occurs in the lower extremities (legs) but it can occur in other areas of the body, such as your arms. The cholesterol buildup in the arteries reduces blood flow which can cause pain and other serious problems. The presence of PVD can place a person at risk for Coronary Artery Disease (CAD).  CAUSES  Causes of PVD can be many. It is usually associated with more than one risk factor such as:   High Cholesterol.  Smoking.  Diabetes.  Lack of exercise or inactivity.  High blood pressure (hypertension).  Obesity.  Family history. SYMPTOMS   When the lower extremities are affected, patients with PVD may experience:  Leg pain with exertion or physical activity. This is called INTERMITTENT CLAUDICATION. This may present as cramping or numbness with physical activity. The location of the pain is associated with the level of blockage. For example, blockage at the abdominal level (distal abdominal aorta) may result in buttock or hip pain. Lower leg arterial blockage may result in calf pain.  As PVD becomes more severe, pain can develop with less physical activity.  In people with severe PVD, leg pain may occur at rest.  Other PVD signs and symptoms:  Leg numbness or weakness.  Coldness in the affected leg or foot, especially when compared to the other leg.  A change in leg color.  Patients with significant PVD are more prone to ulcers or sores on toes, feet or legs. These may take longer to heal or may reoccur. The ulcers or sores can become infected.  If signs and symptoms of PVD are ignored, gangrene may occur. This can result in the loss of toes or loss of an entire limb.  Not all leg pain is related to PVD. Other medical conditions can cause leg pain such  as:  Blood clots (embolism) or Deep Vein Thrombosis.  Inflammation of the blood vessels (vasculitis).  Spinal stenosis. DIAGNOSIS  Diagnosis of PVD can involve several different types of tests. These can include:  Pulse Volume Recording Method (PVR). This test is simple, painless and does not involve the use of X-rays. PVR involves measuring and comparing the blood pressure in the arms and legs. An ABI (Ankle-Brachial Index) is calculated. The normal ratio of blood pressures is 1. As this number becomes smaller, it indicates more severe disease.  < 0.95 - indicates significant narrowing in one or more leg vessels.  <0.8 - there will usually be pain in the foot, leg or buttock with exercise.  <0.4 - will usually have pain in the legs at rest.  <0.25 - usually indicates limb threatening PVD.  Doppler detection of pulses in the legs. This test is painless and checks to see if you have a pulses in your legs/feet.  A dye or contrast material (a substance that highlights the blood vessels so they show up on x-ray) may be given to help your caregiver better see the arteries for the following tests. The dye is eliminated from your body by the kidney's. Your caregiver may order blood work to check your kidney function and other laboratory values before the following tests are performed:  Magnetic Resonance Angiography (MRA). An MRA is a picture study of the blood vessels and arteries. The MRA machine uses a large magnet to produce images of the blood vessels.  Computed Tomography Angiography (CTA). A CTA   is a specialized x-ray that looks at how the blood flows in your blood vessels. An IV may be inserted into your arm so contrast dye can be injected.  Angiogram. Is a procedure that uses x-rays to look at your blood vessels. This procedure is minimally invasive, meaning a small incision (cut) is made in your groin. A small tube (catheter) is then inserted into the artery of your groin. The catheter  is guided to the blood vessel or artery your caregiver wants to examine. Contrast dye is injected into the catheter. X-rays are then taken of the blood vessel or artery. After the images are obtained, the catheter is taken out. TREATMENT  Treatment of PVD involves many interventions which may include:  Lifestyle changes:  Quitting smoking.  Exercise.  Following a low fat, low cholesterol diet.  Control of diabetes.  Foot care is very important to the PVD patient. Good foot care can help prevent infection.  Medication:  Cholesterol-lowering medicine.  Blood pressure medicine.  Anti-platelet drugs.  Certain medicines may reduce symptoms of Intermittent Claudication.  Interventional/Surgical options:  Angioplasty. An Angioplasty is a procedure that inflates a balloon in the blocked artery. This opens the blocked artery to improve blood flow.  Stent Implant. A wire mesh tube (stent) is placed in the artery. The stent expands and stays in place, allowing the artery to remain open.  Peripheral Bypass Surgery. This is a surgical procedure that reroutes the blood around a blocked artery to help improve blood flow. This type of procedure may be performed if Angioplasty or stent implants are not an option. SEEK IMMEDIATE MEDICAL CARE IF:   You develop pain or numbness in your arms or legs.  Your arm or leg turns cold, becomes blue in color.  You develop redness, warmth, swelling and pain in your arms or legs. MAKE SURE YOU:   Understand these instructions.  Will watch your condition.  Will get help right away if you are not doing well or get worse. Document Released: 12/09/2004 Document Revised: 01/24/2012 Document Reviewed: 11/05/2008 ExitCare Patient Information 2015 [employer]. This information is not intended to replace advice given to you by your health care provider. Make sure you discuss any questions you have with your health care provider.   Smoking  Cessation Quitting smoking is important to your health and has many advantages. However, it is not always easy to quit since nicotine is a very addictive drug. Oftentimes, people try 3 times or more before being able to quit. This document explains the best ways for you to prepare to quit smoking. Quitting takes hard work and a lot of effort, but you can do it. ADVANTAGES OF QUITTING SMOKING  You will live longer, feel better, and live better.  Your body will feel the impact of quitting smoking almost immediately.  Within 20 minutes, blood pressure decreases. Your pulse returns to its normal level.  After 8 hours, carbon monoxide levels in the blood return to normal. Your oxygen level increases.  After 24 hours, the chance of having a heart attack starts to decrease. Your breath, hair, and body stop smelling like smoke.  After 48 hours, damaged nerve endings begin to recover. Your sense of taste and smell improve.  After 72 hours, the body is virtually free of nicotine. Your bronchial tubes relax and breathing becomes easier.  After 2 to 12 weeks, lungs can hold more air. Exercise becomes easier and circulation improves.  The risk of having a heart attack, stroke, cancer,   or lung disease is greatly reduced.  After 1 year, the risk of coronary heart disease is cut in half.  After 5 years, the risk of stroke falls to the same as a nonsmoker.  After 10 years, the risk of lung cancer is cut in half and the risk of other cancers decreases significantly.  After 15 years, the risk of coronary heart disease drops, usually to the level of a nonsmoker.  If you are pregnant, quitting smoking will improve your chances of having a healthy baby.  The people you live with, especially any children, will be healthier.  You will have extra money to spend on things other than cigarettes. QUESTIONS TO THINK ABOUT BEFORE ATTEMPTING TO QUIT You may want to talk about your answers with your health care  provider.  Why do you want to quit?  If you tried to quit in the past, what helped and what did not?  What will be the most difficult situations for you after you quit? How will you plan to handle them?  Who can help you through the tough times? Your family? Friends? A health care provider?  What pleasures do you get from smoking? What ways can you still get pleasure if you quit? Here are some questions to ask your health care provider:  How can you help me to be successful at quitting?  What medicine do you think would be best for me and how should I take it?  What should I do if I need more help?  What is smoking withdrawal like? How can I get information on withdrawal? GET READY  Set a quit date.  Change your environment by getting rid of all cigarettes, ashtrays, matches, and lighters in your home, car, or work. Do not let people smoke in your home.  Review your past attempts to quit. Think about what worked and what did not. GET SUPPORT AND ENCOURAGEMENT You have a better chance of being successful if you have help. You can get support in many ways.  Tell your family, friends, and coworkers that you are going to quit and need their support. Ask them not to smoke around you.  Get individual, group, or telephone counseling and support. Programs are available at local hospitals and health centers. Call your local health department for information about programs in your area.  Spiritual beliefs and practices may help some smokers quit.  Download a "quit meter" on your computer to keep track of quit statistics, such as how long you have gone without smoking, cigarettes not smoked, and money saved.  Get a self-help book about quitting smoking and staying off tobacco. LEARN NEW SKILLS AND BEHAVIORS  Distract yourself from urges to smoke. Talk to someone, go for a walk, or occupy your time with a task.  Change your normal routine. Take a different route to work. Drink tea  instead of coffee. Eat breakfast in a different place.  Reduce your stress. Take a hot bath, exercise, or read a book.  Plan something enjoyable to do every day. Reward yourself for not smoking.  Explore interactive web-based programs that specialize in helping you quit. GET MEDICINE AND USE IT CORRECTLY Medicines can help you stop smoking and decrease the urge to smoke. Combining medicine with the above behavioral methods and support can greatly increase your chances of successfully quitting smoking.  Nicotine replacement therapy helps deliver nicotine to your body without the negative effects and risks of smoking. Nicotine replacement therapy includes nicotine gum, lozenges, inhalers,   nasal sprays, and skin patches. Some may be available over-the-counter and others require a prescription.  Antidepressant medicine helps people abstain from smoking, but how this works is unknown. This medicine is available by prescription.  Nicotinic receptor partial agonist medicine simulates the effect of nicotine in your brain. This medicine is available by prescription. Ask your health care provider for advice about which medicines to use and how to use them based on your health history. Your health care provider will tell you what side effects to look out for if you choose to be on a medicine or therapy. Carefully read the information on the package. Do not use any other product containing nicotine while using a nicotine replacement product.  RELAPSE OR DIFFICULT SITUATIONS Most relapses occur within the first 3 months after quitting. Do not be discouraged if you start smoking again. Remember, most people try several times before finally quitting. You may have symptoms of withdrawal because your body is used to nicotine. You may crave cigarettes, be irritable, feel very hungry, cough often, get headaches, or have difficulty concentrating. The withdrawal symptoms are only temporary. They are strongest when you  first quit, but they will go away within 10-14 days. To reduce the chances of relapse, try to:  Avoid drinking alcohol. Drinking lowers your chances of successfully quitting.  Reduce the amount of caffeine you consume. Once you quit smoking, the amount of caffeine in your body increases and can give you symptoms, such as a rapid heartbeat, sweating, and anxiety.  Avoid smokers because they can make you want to smoke.  Do not let weight gain distract you. Many smokers will gain weight when they quit, usually less than 10 pounds. Eat a healthy diet and stay active. You can always lose the weight gained after you quit.  Find ways to improve your mood other than smoking. FOR MORE INFORMATION  www.smokefree.gov  Document Released: 10/26/2001 Document Revised: 03/18/2014 Document Reviewed: 02/10/2012 ExitCare Patient Information 2015 [employer]. This information is not intended to replace advice given to you by your health care provider. Make sure you discuss any questions you have with your health care provider.  
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
## 2014-06-24 ENCOUNTER — Ambulatory Visit (INDEPENDENT_AMBULATORY_CARE_PROVIDER_SITE_OTHER): Payer: Medicare Other | Admitting: Pharmacist

## 2014-06-24 DIAGNOSIS — T82868S Thrombosis of vascular prosthetic devices, implants and grafts, sequela: Secondary | ICD-10-CM

## 2014-06-24 DIAGNOSIS — Z7901 Long term (current) use of anticoagulants: Secondary | ICD-10-CM

## 2014-06-24 DIAGNOSIS — I779 Disorder of arteries and arterioles, unspecified: Secondary | ICD-10-CM

## 2014-06-24 DIAGNOSIS — I743 Embolism and thrombosis of arteries of the lower extremities: Secondary | ICD-10-CM

## 2014-06-24 LAB — POCT INR: INR: 2.8

## 2014-06-24 MED ORDER — WARFARIN SODIUM 4 MG PO TABS: ORAL_TABLET | ORAL | Status: DC

## 2014-06-24 NOTE — Progress Notes (Signed)
Anti-Coagulation Progress Note  Kristin Knight is a 68 y.o. female who is currently on an anti-coagulation regimen.    RECENT RESULTS: Recent results are below, the most recent result is correlated with a dose of 36 mg. per week: Lab Results  Component Value Date   INR 2.80 06/24/2014   INR 4.60 06/03/2014   INR 2.30 05/06/2014    ANTI-COAG DOSE: Anticoagulation Dose Instructions as of 06/24/2014     [name] Tue Wed Thu Fri Sat   New Dose 6 mg 6 mg 4 mg 6 mg 4 mg 6 mg 4 mg       ANTICOAG SUMMARY: Anticoagulation Episode Summary   Current INR goal 2.0-3.0  Next INR check 07/29/2014  INR from last check 2.80 (06/24/2014)  Weekly max dose   Target end date   [facility]  Preferred lab   Send INR reminders to    Indications  Peripheral arterial occlusive disease [444.22] Long term (current) use of anticoagulants [V58.61]        Comments         ANTICOAG TODAY: Anticoagulation Summary as of 06/24/2014   INR goal 2.0-3.0  Selected INR 2.80 (06/24/2014)  Next INR check 07/29/2014  Target end date    Indications  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  Patient instructed to take today's dose.  Patient verbalized understanding of these instructions.       FOLLOW-UP Return in 5 weeks (on 07/29/2014) for Follow up INR at 0915h.  Jorene Guest, III Pharm.D., CACP

## 2014-06-24 NOTE — Patient Instructions (Signed)
Patient instructed to take medications as defined in the Anti-coagulation Track section of this encounter.  Patient instructed to take today's dose.  Patient verbalized understanding of these instructions.    

## 2014-07-05 ENCOUNTER — Ambulatory Visit: Payer: Self-pay | Admitting: Internal Medicine

## 2014-07-08 NOTE — Progress Notes (Signed)
I have reviewed Dr. Groce's note.  Kristin Knight is on anticoagulation for vascular graft thrombosis.  

## 2014-07-25 ENCOUNTER — Ambulatory Visit (INDEPENDENT_AMBULATORY_CARE_PROVIDER_SITE_OTHER): Payer: Medicare Other | Admitting: Internal Medicine

## 2014-07-25 ENCOUNTER — Encounter: Payer: Self-pay | Admitting: Internal Medicine

## 2014-07-25 VITALS — BP 120/75 | HR 66 | Temp 98.3°F | Wt 132.8 lb

## 2014-07-25 DIAGNOSIS — M5380 Other specified dorsopathies, site unspecified: Secondary | ICD-10-CM

## 2014-07-25 DIAGNOSIS — F331 Major depressive disorder, recurrent, moderate: Secondary | ICD-10-CM

## 2014-07-25 DIAGNOSIS — Z23 Encounter for immunization: Secondary | ICD-10-CM | POA: Diagnosis not present

## 2014-07-25 DIAGNOSIS — Z7901 Long term (current) use of anticoagulants: Secondary | ICD-10-CM | POA: Diagnosis not present

## 2014-07-25 DIAGNOSIS — Z86718 Personal history of other venous thrombosis and embolism: Secondary | ICD-10-CM | POA: Diagnosis not present

## 2014-07-25 DIAGNOSIS — M810 Age-related osteoporosis without current pathological fracture: Secondary | ICD-10-CM | POA: Diagnosis not present

## 2014-07-25 DIAGNOSIS — F17200 Nicotine dependence, unspecified, uncomplicated: Secondary | ICD-10-CM

## 2014-07-25 DIAGNOSIS — I779 Disorder of arteries and arterioles, unspecified: Secondary | ICD-10-CM

## 2014-07-25 DIAGNOSIS — I743 Embolism and thrombosis of arteries of the lower extremities: Secondary | ICD-10-CM | POA: Diagnosis not present

## 2014-07-25 DIAGNOSIS — M62838 Other muscle spasm: Secondary | ICD-10-CM

## 2014-07-25 DIAGNOSIS — I10 Essential (primary) hypertension: Secondary | ICD-10-CM | POA: Diagnosis not present

## 2014-07-25 MED ORDER — OXYCODONE-ACETAMINOPHEN 5-325 MG PO TABS: 1.0000 | ORAL_TABLET | Freq: Four times a day (QID) | ORAL | Status: DC | PRN

## 2014-07-25 MED ORDER — ALENDRONATE SODIUM 70 MG PO TABS: 70.0000 mg | ORAL_TABLET | ORAL | Status: DC

## 2014-07-25 MED ORDER — TRAMADOL HCL 50 MG PO TABS: 50.0000 mg | ORAL_TABLET | Freq: Two times a day (BID) | ORAL | Status: DC | PRN

## 2014-07-25 MED ORDER — CALCIUM CITRATE-VITAMIN D 315-200 MG-UNIT PO TABS: 2.0000 | ORAL_TABLET | Freq: Two times a day (BID) | ORAL | Status: DC

## 2014-07-25 MED ORDER — TRIAMTERENE-HCTZ 37.5-25 MG PO TABS: 1.0000 | ORAL_TABLET | Freq: Every day | ORAL | Status: DC

## 2014-07-25 NOTE — Assessment & Plan Note (Signed)
She's not had any thrombotic events while taking the Coumadin. She is followed closely by vascular surgery. We will continue the warfarin as dosed by Dr. Elie Confer in the anticoagulation clinic.

## 2014-07-25 NOTE — Patient Instructions (Signed)
It was great to see you again.  I am so happy with how you are doing.  1) Keep taking all of your medications as you are.  2) I added percocet 5-325 mg 1 tablet by mouth up to every 6 hours as need for neck pain.  You will get #60/month.  I gave you three months of prescriptions to take to the pharmacy so you do not have to come here to get them.  3) We gave you the flu shot today.  I will see you back in 3 months, sooner if necessary.

## 2014-07-25 NOTE — Assessment & Plan Note (Signed)
She received a flu vaccination today. Zostavax continues to be an issue secondary to the cost. We will reassess at the followup visit.

## 2014-07-25 NOTE — Assessment & Plan Note (Signed)
Her mood is much improved on the mirtazapine. She is also much more active and recently went on a church trip to [place]. She states she feels much happier at this point. With this change in mood there is also been an increase in her appetite, also likely related to the mirtazapine. Her BMI is now within the normal range. We will continue the mirtazapine for now given her tremendous response. We will reassess her mood at the followup visit.

## 2014-07-25 NOTE — Assessment & Plan Note (Signed)
She never received the alendronate and calcium-vitamin D despite it being sent to the [facility]. I resent the prescriptions to the pharmacy and confirmed this was the pharmacy she uses. As soon as she receives these medications she will start them for her osteoporosis.

## 2014-07-25 NOTE — Progress Notes (Signed)
   Subjective:    Patient ID: Kristin Knight, female    DOB: 1946/07/09, 69 y.o.   MRN: [id number]  HPI  Please see the A&P for the status of the pt's chronic medical problems.  Review of Systems  Constitutional: Positive for appetite change. Negative for activity change and unexpected weight change.  Respiratory: Negative for chest tightness, shortness of breath and wheezing.   Cardiovascular: Negative for chest pain, palpitations and leg swelling.  Gastrointestinal: Negative for nausea, vomiting, abdominal pain, diarrhea, constipation and abdominal distention.  Musculoskeletal: Positive for back pain, myalgias, neck pain and neck stiffness. Negative for arthralgias, gait problem and joint swelling.  Skin: Negative for rash and wound.  Neurological: Negative for syncope and weakness.  Psychiatric/Behavioral: Negative for dysphoric mood.      Objective:   Physical Exam  Nursing note and vitals reviewed. Constitutional: She is oriented to person, place, and time. She appears well-developed and well-nourished. No distress.  HENT:  Head: Normocephalic and atraumatic.  Eyes: Conjunctivae are normal. Right eye exhibits no discharge. Left eye exhibits no discharge. No scleral icterus.  Cardiovascular: Normal rate, regular rhythm and normal heart sounds.  Exam reveals no gallop and no friction rub.   No murmur heard. Pulmonary/Chest: Effort normal and breath sounds normal. No respiratory distress. She has no wheezes. She has no rales.  Abdominal: Soft. Bowel sounds are normal. She exhibits no distension. There is no tenderness. There is no rebound and no guarding.  Musculoskeletal: Normal range of motion. She exhibits no edema and no tenderness.  Neurological: She is alert and oriented to person, place, and time. She exhibits normal muscle tone.  Skin: Skin is warm and dry. No rash noted. She is not diaphoretic. No erythema.  Psychiatric: She has a normal mood and affect. Her behavior is  normal. Judgment and thought content normal.      Assessment & Plan:   Please see problem oriented charting.

## 2014-07-25 NOTE — Assessment & Plan Note (Signed)
This issue was not addressed at this visit secondary to time. At the followup visit we will ascertain whether she is now interested in Chantix given the improvement in her depression.

## 2014-07-25 NOTE — Assessment & Plan Note (Signed)
Her blood pressure today was 120/75 which is well within target. This is on lisinopril and triamterene-hydrochlorothiazide. We will continue the lisinopril at 20 mg by mouth daily and the triamterene-hydrochlorothiazide at 37.5-25 mg by mouth daily.

## 2014-07-25 NOTE — Assessment & Plan Note (Signed)
She never got the Tylenol for her back pain. This continues to be a problem for her from a quality of life standpoint. She has not responded well to the tramadol. I am hesitant to use nonsteroidal anti-inflammatory agents given her need for lifelong Coumadin. In the hospital, postoperatively, she responded well to Percocet. We therefore decided to start Percocet 5-325 mg one tablet by mouth every 6 hours as needed for pain, dispense #60 per month. A chronic narcotic pain contract was signed. She was given 3 monthly prescriptions to take to the pharmacy so she would not have to stop by the clinic each month to get a refill. We will reassess her pain control with the addition of Percocet to the as needed tramadol at the followup visit.

## 2014-08-12 ENCOUNTER — Ambulatory Visit: Payer: Self-pay

## 2014-08-19 ENCOUNTER — Ambulatory Visit (INDEPENDENT_AMBULATORY_CARE_PROVIDER_SITE_OTHER): Payer: Medicare Other | Admitting: Pharmacist

## 2014-08-19 DIAGNOSIS — I779 Disorder of arteries and arterioles, unspecified: Secondary | ICD-10-CM | POA: Diagnosis not present

## 2014-08-19 DIAGNOSIS — Z7901 Long term (current) use of anticoagulants: Secondary | ICD-10-CM

## 2014-08-19 LAB — POCT INR: INR: 3.7

## 2014-08-19 NOTE — Progress Notes (Signed)
Indication: s/p recurrent graft thrombosis. Duration: Lifelong. INR: Above target. Agree with Dr. Aquilla Solian assessment and plan.

## 2014-08-19 NOTE — Progress Notes (Signed)
Kristin Knight is a 68 y.o. female who is currently on an anti-coagulation regimen.    RECENT RESULTS: Recent results are below, the most recent result is correlated with a dose of 36 mg. per week: Lab Results  Component Value Date   INR 3.7 08/19/2014   INR 2.80 06/24/2014   INR 4.60 06/03/2014    ANTI-COAG DOSE: Anticoagulation Dose Instructions as of 08/19/2014     [name] Tue Wed Thu Fri Sat   New Dose 4 mg 6 mg 4 mg 6 mg 4 mg 6 mg 4 mg    Description       Please hold today's dose on 10/5 then continue with changed regimen       ANTICOAG SUMMARY: Anticoagulation Episode Summary   Current INR goal 2.0-3.0  Next INR check 09/02/2014  INR from last check 3.7! (08/19/2014)  Weekly max dose   Target end date   [facility] check [facility]  Preferred lab   Send INR reminders to    Indications  Peripheral arterial occlusive disease [I77.9] Long term (current) use of anticoagulants [Z79.01]        Comments         ANTICOAG TODAY: Anticoagulation Summary as of 08/19/2014   INR goal 2.0-3.0  Selected INR 3.7! (08/19/2014)  Next INR check 09/02/2014  Target end date    Indications  Peripheral arterial occlusive disease [I77.9] Long term (current) use of anticoagulants [Z79.01]      Anticoagulation Episode Summary   [facility]   Preferred lab    Send INR reminders to    Comments       PATIENT INSTRUCTIONS: Patient Instructions  Patient instructed to take medications as defined in the Anti-coagulation Track section of this encounter.  Patient instructed to HOLD today's dose.  Patient verbalized understanding of these instructions.     FOLLOW-UP Return in 2 weeks (on 09/02/2014) for Follow up INR at 2:00.  Elenor Quinones, PharmD PGY1 Pharmacy Resident

## 2014-08-19 NOTE — Patient Instructions (Signed)
Patient instructed to take medications as defined in the Anti-coagulation Track section of this encounter.  Patient instructed to HOLD today's dose.  Patient verbalized understanding of these instructions.      

## 2014-09-02 ENCOUNTER — Ambulatory Visit (INDEPENDENT_AMBULATORY_CARE_PROVIDER_SITE_OTHER): Payer: Medicare Other | Admitting: Pharmacist

## 2014-09-02 DIAGNOSIS — I779 Disorder of arteries and arterioles, unspecified: Secondary | ICD-10-CM | POA: Diagnosis not present

## 2014-09-02 DIAGNOSIS — Z7901 Long term (current) use of anticoagulants: Secondary | ICD-10-CM

## 2014-09-02 DIAGNOSIS — T82868D Thrombosis of vascular prosthetic devices, implants and grafts, subsequent encounter: Secondary | ICD-10-CM | POA: Diagnosis not present

## 2014-09-02 DIAGNOSIS — T82868S Thrombosis of vascular prosthetic devices, implants and grafts, sequela: Secondary | ICD-10-CM

## 2014-09-02 LAB — POCT INR: INR: 4.5

## 2014-09-02 MED ORDER — WARFARIN SODIUM 4 MG PO TABS: 4.0000 mg | ORAL_TABLET | Freq: Every day | ORAL | Status: DC

## 2014-09-02 NOTE — Progress Notes (Signed)
Anti-Coagulation Progress Note  Kristin Knight is a 68 y.o. female who is currently on an anti-coagulation regimen.    RECENT RESULTS: Recent results are below, the most recent result is correlated with a dose of 34 mg. per week: Lab Results  Component Value Date   INR 4.50 09/02/2014   INR 3.7 08/19/2014   INR 2.80 06/24/2014    ANTI-COAG DOSE: Anticoagulation Dose Instructions as of 09/02/2014     Sun Mon Tue Wed Thu Fri Sat   New Dose 4 mg 4 mg 4 mg 4 mg 4 mg 4 mg 4 mg       ANTICOAG SUMMARY: Anticoagulation Episode Summary   Current INR goal 2.0-3.0  Next INR check 09/16/2014  INR from last check 4.50! (09/02/2014)  Weekly max dose   Target end date   [facility]  Preferred lab   Send INR reminders to    Indications  Long term (current) use of anticoagulants [Z79.01] 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]        Comments         ANTICOAG TODAY: Anticoagulation Summary as of 09/02/2014   INR goal 2.0-3.0  Selected INR 4.50! (09/02/2014)  Next INR check 09/16/2014  Target end date    Indications  Long term (current) use of anticoagulants [Z79.01] 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]      Anticoagulation Episode Summary   [facility]   Preferred lab    Send INR reminders to    Comments       PATIENT INSTRUCTIONS: Patient Instructions  Patient instructed to take medications as defined in the Anti-coagulation Track section of this encounter.  Patient instructed to take today's dose.  Patient verbalized understanding of these instructions.       FOLLOW-UP Return in 2 weeks (on 09/16/2014) for Follow up INR at [place], III Pharm.D., CACP

## 2014-09-02 NOTE — Progress Notes (Signed)
Indication: Recurrent vascular graft thrombosis. Duration: Lifelong. INR: Above target. Agree with Dr. Gladstone Pih assessment and plan.

## 2014-09-02 NOTE — Addendum Note (Signed)
Addended by: Jorene Guest B on: 09/02/2014 03:19 PM   Modules accepted: Orders

## 2014-09-02 NOTE — Patient Instructions (Signed)
Patient instructed to take medications as defined in the Anti-coagulation Track section of this encounter.  Patient instructed to take today's dose.  Patient verbalized understanding of these instructions.    

## 2014-09-16 ENCOUNTER — Ambulatory Visit: Payer: Medicare Other

## 2014-09-16 DIAGNOSIS — I779 Disorder of arteries and arterioles, unspecified: Secondary | ICD-10-CM

## 2014-09-16 DIAGNOSIS — T82868D Thrombosis of vascular prosthetic devices, implants and grafts, subsequent encounter: Secondary | ICD-10-CM

## 2014-10-14 ENCOUNTER — Ambulatory Visit: Payer: Medicare Other | Admitting: Pharmacist

## 2014-10-14 DIAGNOSIS — T82868D Thrombosis of vascular prosthetic devices, implants and grafts, subsequent encounter: Secondary | ICD-10-CM

## 2014-10-14 DIAGNOSIS — Z7901 Long term (current) use of anticoagulants: Secondary | ICD-10-CM

## 2014-10-14 DIAGNOSIS — I779 Disorder of arteries and arterioles, unspecified: Secondary | ICD-10-CM

## 2014-10-14 LAB — POCT INR: INR: 1.9

## 2014-10-19 ENCOUNTER — Emergency Department (HOSPITAL_COMMUNITY)
Admission: EM | Admit: 2014-10-19 | Discharge: 2014-10-19 | Disposition: A | Discharge status: Home / Self Care | Payer: Medicare Other | Attending: Emergency Medicine | Admitting: Emergency Medicine

## 2014-10-19 ENCOUNTER — Encounter (HOSPITAL_COMMUNITY): Payer: Self-pay

## 2014-10-19 DIAGNOSIS — R200 Anesthesia of skin: Secondary | ICD-10-CM | POA: Insufficient documentation

## 2014-10-19 DIAGNOSIS — I10 Essential (primary) hypertension: Secondary | ICD-10-CM | POA: Insufficient documentation

## 2014-10-19 DIAGNOSIS — Z880 Allergy status to penicillin: Secondary | ICD-10-CM | POA: Diagnosis not present

## 2014-10-19 DIAGNOSIS — Z7901 Long term (current) use of anticoagulants: Secondary | ICD-10-CM | POA: Insufficient documentation

## 2014-10-19 DIAGNOSIS — Z720 Tobacco use: Secondary | ICD-10-CM | POA: Diagnosis not present

## 2014-10-19 DIAGNOSIS — Z8619 Personal history of other infectious and parasitic diseases: Secondary | ICD-10-CM | POA: Diagnosis not present

## 2014-10-19 DIAGNOSIS — Z8719 Personal history of other diseases of the digestive system: Secondary | ICD-10-CM | POA: Insufficient documentation

## 2014-10-19 DIAGNOSIS — M810 Age-related osteoporosis without current pathological fracture: Secondary | ICD-10-CM | POA: Insufficient documentation

## 2014-10-19 DIAGNOSIS — Z86018 Personal history of other benign neoplasm: Secondary | ICD-10-CM | POA: Diagnosis not present

## 2014-10-19 DIAGNOSIS — R350 Frequency of micturition: Secondary | ICD-10-CM | POA: Diagnosis not present

## 2014-10-19 DIAGNOSIS — F331 Major depressive disorder, recurrent, moderate: Secondary | ICD-10-CM | POA: Diagnosis not present

## 2014-10-19 DIAGNOSIS — Z8709 Personal history of other diseases of the respiratory system: Secondary | ICD-10-CM | POA: Diagnosis not present

## 2014-10-19 DIAGNOSIS — Z79899 Other long term (current) drug therapy: Secondary | ICD-10-CM | POA: Insufficient documentation

## 2014-10-19 DIAGNOSIS — M545 Low back pain: Secondary | ICD-10-CM | POA: Diagnosis not present

## 2014-10-19 LAB — URINALYSIS, ROUTINE W REFLEX MICROSCOPIC
Bilirubin Urine: NEGATIVE
GLUCOSE, UA: NEGATIVE mg/dL
Ketones, ur: NEGATIVE mg/dL
Nitrite: NEGATIVE
PH: 7 (ref 5.0–8.0)
PROTEIN: NEGATIVE mg/dL
Specific Gravity, Urine: 1.02 (ref 1.005–1.030)
Urobilinogen, UA: 0.2 mg/dL (ref 0.0–1.0)

## 2014-10-19 LAB — URINE MICROSCOPIC-ADD ON

## 2014-10-19 MED ORDER — METHYLPREDNISOLONE SODIUM SUCC 125 MG IJ SOLR
INTRAMUSCULAR | Status: AC
  Filled 2014-10-19: qty 2

## 2014-10-19 MED ORDER — HYDROMORPHONE HCL 1 MG/ML IJ SOLN
2.0000 mg | Freq: Once | INTRAMUSCULAR | Status: AC
  Administered 2014-10-19: 2 mg via INTRAMUSCULAR

## 2014-10-19 MED ORDER — HYDROMORPHONE HCL 1 MG/ML IJ SOLN
INTRAMUSCULAR | Status: AC
  Filled 2014-10-19: qty 2

## 2014-10-19 MED ORDER — METHYLPREDNISOLONE SODIUM SUCC 125 MG IJ SOLR
125.0000 mg | Freq: Once | INTRAMUSCULAR | Status: AC
  Administered 2014-10-19: 125 mg via INTRAMUSCULAR

## 2014-10-19 NOTE — ED Notes (Signed)
Declined W/C at D/C and was escorted to lobby by RN. 

## 2014-10-19 NOTE — Discharge Instructions (Signed)
Your urine did noot appear infected. It was sent for culture, if it grows out a bacteria your will be called for treatment.  SEEK IMMEDIATE MEDICAL ATTENTION IF: New numbness, tingling, weakness, or problem with the use of your arms or legs.  Severe back pain not relieved with medications.  Change in bowel or bladder control.  Increasing pain in any areas of the body (such as chest or abdominal pain).  Shortness of breath, dizziness or fainting.  Nausea (feeling sick to your stomach), vomiting, fever, or sweats.    Back Pain, Adult Low back pain is very common. About 1 in 5 people have back pain.The cause of low back pain is rarely dangerous. The pain often gets better over time.About half of people with a sudden onset of back pain feel better in just 2 weeks. About 8 in 10 people feel better by 6 weeks.  CAUSES Some common causes of back pain include:  Strain of the muscles or ligaments supporting the spine.  Wear and tear (degeneration) of the spinal discs.  Arthritis.  Direct injury to the back. DIAGNOSIS Most of the time, the direct cause of low back pain is not known.However, back pain can be treated effectively even when the exact cause of the pain is unknown.Answering your caregiver's questions about your overall health and symptoms is one of the most accurate ways to make sure the cause of your pain is not dangerous. If your caregiver needs more information, he or she may order lab work or imaging tests (X-rays or MRIs).However, even if imaging tests show changes in your back, this usually does not require surgery. HOME CARE INSTRUCTIONS For many people, back pain returns.Since low back pain is rarely dangerous, it is often a condition that people can learn to [facility] their own.   Remain active. It is stressful on the back to sit or stand in one place. Do not sit, drive, or stand in one place for more than 30 minutes at a time. Take short walks on level surfaces as soon  as pain allows.Try to increase the length of time you walk each day.  Do not stay in bed.Resting more than 1 or 2 days can delay your recovery.  Do not avoid exercise or work.Your body is made to move.It is not dangerous to be active, even though your back may hurt.Your back will likely heal faster if you return to being active before your pain is gone.  Pay attention to your body when you bend and lift. Many people have less discomfortwhen lifting if they bend their knees, keep the load close to their bodies,and avoid twisting. Often, the most comfortable positions are those that put less stress on your recovering back.  Find a comfortable position to sleep. Use a firm mattress and lie on your side with your knees slightly bent. If you lie on your back, put a pillow under your knees.  Only take over-the-counter or prescription medicines as directed by your caregiver. Over-the-counter medicines to reduce pain and inflammation are often the most helpful.Your caregiver may prescribe muscle relaxant drugs.These medicines help dull your pain so you can more quickly return to your normal activities and healthy exercise.  Put ice on the injured area.  Put ice in a plastic bag.  Place a towel between your skin and the bag.  Leave the ice on for 15-20 minutes, 03-04 times a day for the first 2 to 3 days. After that, ice and heat may be alternated to reduce  pain and spasms.  Ask your caregiver about trying back exercises and gentle massage. This may be of some benefit.  Avoid feeling anxious or stressed.Stress increases muscle tension and can worsen back pain.It is important to recognize when you are anxious or stressed and learn ways to manage it.Exercise is a great option. SEEK MEDICAL CARE IF:  You have pain that is not relieved with rest or medicine.  You have pain that does not improve in 1 week.  You have new symptoms.  You are generally not feeling well. SEEK IMMEDIATE  MEDICAL CARE IF:   You have pain that radiates from your back into your legs.  You develop new bowel or bladder control problems.  You have unusual weakness or numbness in your arms or legs.  You develop nausea or vomiting.  You develop abdominal pain.  You feel faint. Document Released: 11/01/2005 Document Revised: 05/02/2012 Document Reviewed: 03/05/2014 [facility] Patient Information 2015 [place], [place]. This information is not intended to replace advice given to you by your health care provider. Make sure you discuss any questions you have with your health care provider.  Chronic Back Pain  When back pain lasts longer than 3 months, it is called chronic back pain.People with chronic back pain often go through certain periods that are more intense (flare-ups).  CAUSES Chronic back pain can be caused by wear and tear (degeneration) on different structures in your back. These structures include:  The bones of your spine (vertebrae) and the joints surrounding your spinal cord and nerve roots (facets).  The strong, fibrous tissues that connect your vertebrae (ligaments). Degeneration of these structures may result in pressure on your nerves. This can lead to constant pain. HOME CARE INSTRUCTIONS  Avoid bending, heavy lifting, prolonged sitting, and activities which make the problem worse.  Take brief periods of rest throughout the day to reduce your pain. Lying down or standing usually is better than sitting while you are resting.  Take over-the-counter or prescription medicines only as directed by your caregiver. SEEK IMMEDIATE MEDICAL CARE IF:   You have weakness or numbness in one of your legs or feet.  You have trouble controlling your bladder or bowels.  You have nausea, vomiting, abdominal pain, shortness of breath, or fainting. Document Released: 12/09/2004 Document Revised: 01/24/2012 Document Reviewed: 10/16/2011 [facility] Patient Information 2015 [place], [place]. This  information is not intended to replace advice given to you by your health care provider. Make sure you discuss any questions you have with your health care provider.
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
## 2014-10-19 NOTE — ED Notes (Signed)
Pt presents with c/o back pain.  Pt states she has hx of back pain, but it worsened overnight.

## 2014-10-21 LAB — URINE CULTURE

## 2014-10-25 ENCOUNTER — Encounter: Payer: Self-pay | Admitting: Internal Medicine

## 2014-10-25 ENCOUNTER — Ambulatory Visit (INDEPENDENT_AMBULATORY_CARE_PROVIDER_SITE_OTHER): Payer: Medicare Other | Admitting: Internal Medicine

## 2014-10-25 VITALS — BP 105/67 | HR 66 | Temp 97.9°F | Wt 139.3 lb

## 2014-10-25 DIAGNOSIS — M545 Low back pain: Secondary | ICD-10-CM

## 2014-10-25 DIAGNOSIS — G8929 Other chronic pain: Secondary | ICD-10-CM | POA: Insufficient documentation

## 2014-10-25 DIAGNOSIS — M5459 Other low back pain: Secondary | ICD-10-CM

## 2014-10-25 DIAGNOSIS — M62838 Other muscle spasm: Secondary | ICD-10-CM

## 2014-10-25 DIAGNOSIS — Z720 Tobacco use: Secondary | ICD-10-CM

## 2014-10-25 DIAGNOSIS — I10 Essential (primary) hypertension: Secondary | ICD-10-CM

## 2014-10-25 DIAGNOSIS — F17210 Nicotine dependence, cigarettes, uncomplicated: Secondary | ICD-10-CM | POA: Diagnosis not present

## 2014-10-25 DIAGNOSIS — M810 Age-related osteoporosis without current pathological fracture: Secondary | ICD-10-CM | POA: Diagnosis not present

## 2014-10-25 DIAGNOSIS — F331 Major depressive disorder, recurrent, moderate: Secondary | ICD-10-CM

## 2014-10-25 MED ORDER — OXYCODONE-ACETAMINOPHEN 5-325 MG PO TABS: 1.0000 | ORAL_TABLET | Freq: Four times a day (QID) | ORAL | Status: DC | PRN

## 2014-10-25 MED ORDER — BACLOFEN 10 MG PO TABS: 10.0000 mg | ORAL_TABLET | Freq: Three times a day (TID) | ORAL | Status: DC | PRN

## 2014-10-25 NOTE — Patient Instructions (Signed)
It was great to see you again.  I am sorry that your back is very painful.  I think it is secondary to muscle spasm or a slipped disc.  1) I re-prescribed the oxycodone.  Take 1-2 tablets as needed every 6 hours for pain.  2) I prescribed baclofen 10 mg by mouth every 8 hours as needed for muscle spasms/pain in the back.  Take the first dose at night as it may cause some sleepiness.  3) Keep working on quitting smoking.  It is very hard and I am glad you are trying.  4) Keep taking all of your other medications as you are.  5) Call the clinic next month if your back pain is not any better so that I can see you again and maybe get a X-ray to make sure there is not a broken bone.  Otherwise, your back should be better over the next 4 weeks.  I will see you back in clinic in 3 months, sooner if necessary.  I hope you and your family have a happy holiday season.

## 2014-10-25 NOTE — Assessment & Plan Note (Signed)
She continues to try to quit smoking on her own. She understands the risks of smoking and is committed to making a good effort at stopping. She was encouraged to continue her efforts at smoking cessation. We will reassess her success at the follow-up visit.

## 2014-10-25 NOTE — Assessment & Plan Note (Signed)
Her blood pressure today is 105/67 which is well within the target range. This is on triamterene-hydrochlorothiazide 37.5-25 mg daily and lisinopril 20 mg by mouth daily. We will continue this regimen at the current doses.

## 2014-10-25 NOTE — Assessment & Plan Note (Signed)
One-week prior to this visit she was cleaning her church. Late that night she noted the onset of low back pain lateral to midline bilaterally. The pain progressively worsened throughout the night to the point that she was unable to move without severe pain. The Percocet helped briefly but she got no relief from the tramadol. The pain was worsened with movement, sneezing, and coughing. Given the severe nature of the pain, she presented to the emergency department where physical exam did not reveal any neurologic abnormalities. She has continued to take the Percocet as needed, along with tramadol and her pain has improved over the last several days. She still has an exacerbation of the pain with coughing or sneezing. She denies any change in her bowel or bladder habits, any numbness or weakness, or any recent trauma to the area.  Physical exam revealed tenderness lateral to mid line bilaterally. There was no spinal tenderness or step-off. Also lateral to the spine bilaterally was significant muscle spasm of the latissimus dorsi.  We will continue the Percocet 5-325 mg 1-2 tablets every 6 hours as needed for severe pain dispense #60 per month, and tramadol 50 mg by mouth every 12 hours as needed for moderate pain. We also started baclofen 10 mg by mouth 3 times daily as needed for muscle spasms. She was counseled to call us immediately with the development of any numbness or weakness of the lower extremities or change in her bowel or bladder habits. It was explained to her that her pain should improve over the next 4-6 weeks and that no further imaging or intervention is necessary at this time. She will call us in 4-6 weeks if the pain has not resolved for reassessment as she is at risk for an osteoporotic compression fracture of the spine given her underlying osteoporosis. This can be further assessed with plain films should it be needed.

## 2014-10-25 NOTE — Assessment & Plan Note (Signed)
Her depression has responded exceedingly well to the mirtazapine 15 mg by mouth at bedtime. She no longer has anhedonia and is much more active within the community. Her appetite is markedly improved and her BMI is now 23 within normal range. I inquired if she felt she may be ready to stop the mirtazapine but she states she is not ready at this time. She feels that it has done a fantastic job and would like to continue taking it. We discussed the problem of excessive weight gain and I encouraged her to be cognizant of this moving forward. She does not feel that she will become overweight or obese but understands the mirtazapine may result in this if she is not careful. We will reassess her mood and weight at the follow-up visit.

## 2014-10-25 NOTE — Assessment & Plan Note (Signed)
At the last visit she was noted not to be taking the alendronate and calcium and vitamin D. The prescription was resent to [facility] and she has since picked it up restarted it. She is tolerating this regimen well. We will continue this regimen.

## 2014-10-25 NOTE — Progress Notes (Signed)
   Subjective:    Patient ID: Kristin Knight, female    DOB: [birth year], 68 y.o.   MRN: [id number]  HPI  Please see the A&P for the status of the pt's chronic medical problems.  Review of Systems  Constitutional: Negative for activity change, appetite change, fatigue and unexpected weight change.  Respiratory: Negative for chest tightness, shortness of breath and wheezing.   Cardiovascular: Negative for chest pain, palpitations and leg swelling.  Gastrointestinal: Negative for nausea, vomiting, abdominal pain, diarrhea, constipation and abdominal distention.  Genitourinary: Negative for urgency, frequency, hematuria, flank pain, decreased urine volume, difficulty urinating and pelvic pain.  Musculoskeletal: Positive for myalgias, back pain, neck pain and neck stiffness. Negative for joint swelling, arthralgias and gait problem.  Skin: Negative for color change, rash and wound.  Neurological: Negative for dizziness, syncope, facial asymmetry, speech difficulty, weakness, light-headedness and numbness.  Psychiatric/Behavioral: Negative for dysphoric mood. The patient is not nervous/anxious.       Objective:   Physical Exam  Constitutional: She is oriented to person, place, and time. She appears well-developed and well-nourished. No distress.  HENT:  Head: Normocephalic and atraumatic.  Eyes: Conjunctivae are normal. Right eye exhibits no discharge. Left eye exhibits no discharge. No scleral icterus.  Cardiovascular: Normal rate, regular rhythm and normal heart sounds.  Exam reveals no gallop and no friction rub.   No murmur heard. Pulmonary/Chest: Effort normal and breath sounds normal. No respiratory distress. She has no wheezes. She has no rales. She exhibits no tenderness.  Abdominal: Soft. Bowel sounds are normal. She exhibits no distension. There is no tenderness. There is no rebound and no guarding.  Musculoskeletal: Normal range of motion. She exhibits no edema.       Lumbar back:  She exhibits tenderness, pain and spasm. She exhibits no bony tenderness, no swelling, no edema, no deformity and no laceration.       Back:  Neurological: She is alert and oriented to person, place, and time. She exhibits normal muscle tone.  Skin: Skin is warm and dry. No rash noted. She is not diaphoretic. No erythema.  Psychiatric: She has a normal mood and affect. Her behavior is normal. Judgment and thought content normal.  Nursing note and vitals reviewed.     Assessment & Plan:   Please see problem oriented charting.

## 2014-11-11 ENCOUNTER — Ambulatory Visit (INDEPENDENT_AMBULATORY_CARE_PROVIDER_SITE_OTHER): Payer: Medicare Other | Admitting: Pharmacist

## 2014-11-11 DIAGNOSIS — T82868D Thrombosis of vascular prosthetic devices, implants and grafts, subsequent encounter: Secondary | ICD-10-CM | POA: Diagnosis not present

## 2014-11-11 DIAGNOSIS — I779 Disorder of arteries and arterioles, unspecified: Secondary | ICD-10-CM | POA: Diagnosis not present

## 2014-11-11 DIAGNOSIS — Z7901 Long term (current) use of anticoagulants: Secondary | ICD-10-CM | POA: Diagnosis not present

## 2014-11-11 DIAGNOSIS — I82402 Acute embolism and thrombosis of unspecified deep veins of left lower extremity: Secondary | ICD-10-CM

## 2014-11-11 LAB — POCT INR: INR: 2.1

## 2014-11-11 NOTE — Progress Notes (Signed)
Anti-Coagulation Progress Note  Kristin Knight is a 68 y.o. female who reports to the clinic for monitoring of anticoagulation treatment.    RECENT RESULTS: Recent results are below, the most recent result is correlated with a dose of 26 mg. per week: Lab Results  Component Value Date   INR 2.1 11/11/2014   INR 1.9 10/14/2014   INR 4.50 09/02/2014    ANTI-COAG DOSE: INR as of 11/11/2014 and Previous Dosing Information    INR Dt INR Goal [employer] Sun Mon Tue Wed Thu Fri Sat   11/11/2014 2.1 2.0-3.0 28 mg 4 mg 4 mg 4 mg 4 mg 4 mg 4 mg 4 mg    Anticoagulation Dose Instructions as of 11/11/2014      Total Sun Mon Tue Wed Thu Fri Sat   New Dose 28 mg 4 mg 4 mg 4 mg 4 mg 4 mg 4 mg 4 mg     (4 mg x 1)  (4 mg x 1)  (4 mg x 1)  (4 mg x 1)  (4 mg x 1)  (4 mg x 1)  (4 mg x 1)                           ANTICOAG SUMMARY: Anticoagulation Episode Summary    Current INR goal 2.0-3.0  Next INR check 12/09/2014  INR from last check 2.1 (11/11/2014)  Weekly max dose   Target end date   [facility] check [facility]  Preferred lab   Send INR reminders to    Indications  Long term (current) use of anticoagulants [Z79.01] 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  Patient instructed to take today's dose.  Patient verbalized understanding of these instructions.       FOLLOW-UP Return in 4 weeks (on 12/09/2014) for Follow up INR at 3:00.  Elenor Quinones, PharmD Clinical Pharmacist Resident Pager [phone] 11/11/2014 3:04 PM

## 2014-11-11 NOTE — Patient Instructions (Signed)
Patient instructed to take medications as defined in the Anti-coagulation Track section of this encounter.  Patient instructed to take today's dose.  Patient verbalized understanding of these instructions.    

## 2014-11-18 ENCOUNTER — Other Ambulatory Visit: Payer: Self-pay | Admitting: *Deleted

## 2014-11-18 DIAGNOSIS — M62838 Other muscle spasm: Secondary | ICD-10-CM

## 2014-11-18 NOTE — Telephone Encounter (Signed)
Last refill 12/11 for # 60, pt states this is only a 2 week supply. Pt # [phone]

## 2014-11-19 MED ORDER — OXYCODONE-ACETAMINOPHEN 5-325 MG PO TABS: 1.0000 | ORAL_TABLET | Freq: Four times a day (QID) | ORAL | Status: DC | PRN

## 2014-11-19 NOTE — Telephone Encounter (Signed)
This is a PRN medication so the #60 was meant to last the month.  Please schedule Kristin Knight with me at my next available non-overbook appointment so we can re-discuss the narcotic medications and how they are to be used and revist her back pain, which had been improving at the last visit.  I will refill the prescription PRN at this point until we have had time to re-review how this medication is to be used and can reassess her back pain.

## 2014-11-29 ENCOUNTER — Encounter: Payer: Self-pay | Admitting: Internal Medicine

## 2014-11-29 ENCOUNTER — Other Ambulatory Visit: Payer: Self-pay | Admitting: Internal Medicine

## 2014-11-29 ENCOUNTER — Ambulatory Visit (INDEPENDENT_AMBULATORY_CARE_PROVIDER_SITE_OTHER): Payer: Medicare Other | Admitting: Internal Medicine

## 2014-11-29 ENCOUNTER — Ambulatory Visit (HOSPITAL_COMMUNITY)
Admission: RE | Admit: 2014-11-29 | Discharge: 2014-11-29 | Disposition: A | Discharge status: Home / Self Care | Payer: Medicare Other | Source: Ambulatory Visit | Attending: Internal Medicine | Admitting: Internal Medicine

## 2014-11-29 VITALS — BP 126/83 | HR 65 | Temp 97.2°F | Wt 134.4 lb

## 2014-11-29 DIAGNOSIS — M545 Low back pain: Secondary | ICD-10-CM

## 2014-11-29 DIAGNOSIS — M62838 Other muscle spasm: Secondary | ICD-10-CM

## 2014-11-29 DIAGNOSIS — M5136 Other intervertebral disc degeneration, lumbar region: Secondary | ICD-10-CM | POA: Diagnosis not present

## 2014-11-29 DIAGNOSIS — I779 Disorder of arteries and arterioles, unspecified: Secondary | ICD-10-CM

## 2014-11-29 DIAGNOSIS — I10 Essential (primary) hypertension: Secondary | ICD-10-CM | POA: Diagnosis not present

## 2014-11-29 DIAGNOSIS — M810 Age-related osteoporosis without current pathological fracture: Secondary | ICD-10-CM

## 2014-11-29 DIAGNOSIS — F331 Major depressive disorder, recurrent, moderate: Secondary | ICD-10-CM

## 2014-11-29 DIAGNOSIS — Z720 Tobacco use: Secondary | ICD-10-CM | POA: Diagnosis not present

## 2014-11-29 DIAGNOSIS — M5459 Other low back pain: Secondary | ICD-10-CM

## 2014-11-29 MED ORDER — BACLOFEN 20 MG PO TABS: 20.0000 mg | ORAL_TABLET | Freq: Three times a day (TID) | ORAL | Status: DC | PRN

## 2014-11-29 MED ORDER — OXYCODONE-ACETAMINOPHEN 5-325 MG PO TABS: 1.0000 | ORAL_TABLET | Freq: Four times a day (QID) | ORAL | Status: DC | PRN

## 2014-11-29 MED ORDER — VARENICLINE TARTRATE 1 MG PO TABS: ORAL_TABLET | ORAL | Status: DC

## 2014-11-29 NOTE — Assessment & Plan Note (Signed)
Her depression remains extremely well controlled symptomatically on the mirtazapine 15 mg by mouth at bedtime. She does admit to being occasionally down because of the semi-acute back pain but feels confident that with evaluation we will find a cause that could be treated. We will therefore continue the mirtazapine at 15 mg by mouth at bedtime. This does limit our ability to start Wellbutrin for smoking cessation. That does not pose much of a problem at this time is there are alternatives as outlined below.

## 2014-11-29 NOTE — Patient Instructions (Signed)
It was good to see you again.  I am sorry your back pain has not gotten better.  1) I increased the baclofen to 20 mg three times a day as needed for the back pain.  2) Keep taking the other medications as you are.  3) I prescribed chantix 0.5 mg (1/2 tablet) once daily for three days, then 0.5 mg (1/2 tablet) once daily for 4 days, then 1 mg (1 tablet) twice daily for 11 weeks to help with smoking cessation.  I am glad you are giving this a try.  4) Get an X-ray of your lower back today.  I have placed the order.  5) We will get an ultrasound of your belly in the next week.  They will call with an appointment.  I will see you back in 2-3 months, sooner if necessary.

## 2014-11-29 NOTE — Assessment & Plan Note (Signed)
Her blood pressure is well controlled on the lisinopril 20 mg by mouth daily and triamterene-hydrochlorothiazide 37.5-25 mg by mouth daily. We will therefore continue this regimen at the current doses. We will follow-up blood pressure control at the return visit.

## 2014-11-29 NOTE — Assessment & Plan Note (Signed)
Ms. Kamen continues to note intermittent lower back pain just to the left of midline. The pain is worse with any activity and is described as a sharp tearing pain exclusively in the back and not in the abdomen. The pain does respond to both baclofen and oxycodone-acetaminophen 5-325 with the baclofen being much more effective than the oxycodone-acetaminophen. There has been no recent trauma and she denies any lightheadedness or other suggestions of vascular compromise. Examination remains consistent with left sided paraspinous muscle spasm although the description of the pain and its rather recent onset force me to consider other possibilities within the differential. First of these would be an aneurysm that may be dissecting chronically. Again, physical examination did not reveal a pulsatile mass but with her underlying vascular disease it would not be unreasonable to obtain an abdominal ultrasound to look for an abdominal cause of this pain, and specifically rule out an abdominal aortic aneurysm. She also has an underlying diagnosis of osteoporosis and this pain may be secondary to an acute compression fracture. Further evaluation for this possibility included a lumbosacral spine film. This revealed a mild compression fracture of L4 and it was felt to be chronic in nature as it was not significantly different than previously reported on the lumbosacral spine film from 2005. Therefore this pain is unlikely to be secondary to an acute osteoporotic vertebral fracture. Musculoskeletal pain remains highest in the differential given her response to the baclofen. We therefore decided to increase the baclofen to 20 mg by mouth every 8 hours as needed for back pain and muscle spasms. We will continue the as needed oxycodone-acetaminophen 5-325 mg. If the abdominal ultrasound is negative for any pathology that might explain the back pain, including an abdominal aortic aneurysm, and the pain persists to the point it is  still activity limiting despite the increase in the baclofen we could consider increasing the milligrams of the oxycodone at the follow-up visit. Response to the above changes will be reassessed at the follow-up visit.

## 2014-11-29 NOTE — Progress Notes (Signed)
   Subjective:    Patient ID: Kristin Knight, female    DOB: 1946/08/05, 69 y.o.   MRN: [id number]  HPI  Please see the A&P for the status of the pt's chronic medical problems.  Review of Systems  Constitutional: Negative for fever, activity change, appetite change and unexpected weight change.  Respiratory: Negative for chest tightness, shortness of breath and wheezing.   Cardiovascular: Negative for chest pain, palpitations and leg swelling.  Gastrointestinal: Negative for nausea, vomiting, abdominal pain, diarrhea, constipation and abdominal distention.  Genitourinary: Negative for flank pain.  Musculoskeletal: Positive for myalgias, back pain and arthralgias. Negative for joint swelling and gait problem.  Skin: Negative for rash and wound.  Neurological: Negative for syncope, weakness and light-headedness.  Psychiatric/Behavioral: Negative for dysphoric mood.      Objective:   Physical Exam  Constitutional: She is oriented to person, place, and time. She appears well-developed and well-nourished. No distress.  HENT:  Head: Normocephalic and atraumatic.  Eyes: Conjunctivae are normal. Right eye exhibits no discharge. Left eye exhibits no discharge. No scleral icterus.  Cardiovascular: Normal rate, regular rhythm and normal heart sounds.  Exam reveals no gallop and no friction rub.   No murmur heard. Pulmonary/Chest: Effort normal and breath sounds normal. No respiratory distress. She has no wheezes. She has no rales.  Abdominal: Soft. Bowel sounds are normal. She exhibits no distension and no mass. There is no tenderness. There is no rebound and no guarding.  Non-pulsitile mass appreciated  Musculoskeletal: Normal range of motion. She exhibits no edema or tenderness.  Neurological: She is alert and oriented to person, place, and time. She exhibits normal muscle tone.  Skin: Skin is warm and dry. No rash noted. She is not diaphoretic. No erythema.  Psychiatric: She has a normal mood  and affect. Her behavior is normal. Judgment and thought content normal.  Nursing note and vitals reviewed.     Assessment & Plan:   Please see problem oriented charting.

## 2014-11-29 NOTE — Assessment & Plan Note (Signed)
She is interested in quitting smoking at this time and has arranged some counseling through the cancer center. She presented a letter that recommended that her primary care provider start her on pharmacologic therapy to assist in the smoking cessation under the guidance of the counseling at the cancer center. As she is on Remeron I'm uncomfortable adding Wellbutrin as there is a slight risk of serotonin syndrome with the combination. I do not know if her insurance will cover transdermal nicotine products and finances could be limiting. We therefore decided to start Chantix in the usual titration to 1 mg twice daily to be completed in 12 weeks. This prescription was sent to the pharmacy and she was praised on her willingness to try to quit smoking. We will reassess her success with this therapy at the follow-up visit.

## 2014-12-09 ENCOUNTER — Ambulatory Visit: Payer: Self-pay

## 2014-12-11 ENCOUNTER — Telehealth: Payer: Self-pay | Admitting: *Deleted

## 2014-12-11 NOTE — Telephone Encounter (Signed)
Pharmacy asks for a clarification of script for CHANTIX, please send a new script with clear directions to pharmacy, thank you or advise and i will call in

## 2014-12-12 NOTE — Telephone Encounter (Signed)
Please call the pharmacy and clarify what about the prescription they are confused about.  The prescription is written as recommended for the up titration of the Chantix.  Thank You.

## 2014-12-16 ENCOUNTER — Ambulatory Visit (INDEPENDENT_AMBULATORY_CARE_PROVIDER_SITE_OTHER): Payer: Medicare Other | Admitting: Pharmacist

## 2014-12-16 DIAGNOSIS — T82868A Thrombosis of vascular prosthetic devices, implants and grafts, initial encounter: Secondary | ICD-10-CM | POA: Diagnosis not present

## 2014-12-16 DIAGNOSIS — I779 Disorder of arteries and arterioles, unspecified: Secondary | ICD-10-CM

## 2014-12-16 DIAGNOSIS — T82868S Thrombosis of vascular prosthetic devices, implants and grafts, sequela: Secondary | ICD-10-CM

## 2014-12-16 DIAGNOSIS — Z7901 Long term (current) use of anticoagulants: Secondary | ICD-10-CM

## 2014-12-17 LAB — POCT INR: INR: 2.2

## 2014-12-17 NOTE — Progress Notes (Signed)
Anti-Coagulation Progress Note  Kristin Knight is a 68 y.o. female who is currently on an anti-coagulation regimen.    RECENT RESULTS: Recent results are below, the most recent result is correlated with a dose of 28 mg. per week: Lab Results  Component Value Date   INR 2.20 12/17/2014   INR 2.1 11/11/2014   INR 1.9 10/14/2014    ANTI-COAG DOSE: Anticoagulation Dose Instructions as of 12/16/2014      [name] Tue Wed Thu Fri Sat   New Dose 4 mg 4 mg 4 mg 4 mg 4 mg 4 mg 4 mg       ANTICOAG SUMMARY: Anticoagulation Episode Summary    Current INR goal 2.0-3.0  Next INR check 01/06/2015  INR from last check 2.1 (11/11/2014)  Most recent INR 2.20 (12/17/2014)  Weekly max dose   Target end date   [facility] check location [facility]  Preferred lab   Send INR reminders to    Indications  Long term current use of anticoagulant therapy [Z79.01] 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]        Comments         ANTICOAG TODAY: Anticoagulation Summary as of 12/16/2014    INR goal 2.0-3.0  Selected INR 2.1 (11/11/2014)  Next INR check 01/06/2015  Target end date    Indications  Long term current use of anticoagulant therapy [Z79.01] 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  Patient instructed to take today's dose.  Patient verbalized understanding of these instructions.       FOLLOW-UP Return in 3 weeks (on 01/06/2015) for Follow up INR at [place], III Pharm.D., CACP

## 2014-12-17 NOTE — Patient Instructions (Signed)
Patient instructed to take medications as defined in the Anti-coagulation Track section of this encounter.  Patient instructed to take today's dose.  Patient verbalized understanding of these instructions.    

## 2014-12-20 ENCOUNTER — Ambulatory Visit (HOSPITAL_COMMUNITY): Payer: Medicare Other

## 2014-12-24 ENCOUNTER — Telehealth: Payer: Self-pay | Admitting: *Deleted

## 2014-12-24 DIAGNOSIS — Z720 Tobacco use: Secondary | ICD-10-CM

## 2014-12-24 MED ORDER — VARENICLINE TARTRATE 1 MG PO TABS: ORAL_TABLET | ORAL | Status: DC

## 2014-12-24 NOTE — Telephone Encounter (Signed)
Pharmacy calling again about the Chantix Rx.  The sig reads 0.5 mg ( 1/2 tablet ) once daily for 3 days, then 0.5 mg ( 1/2 tablet) once a day for 4 days, then 1 mg ( one tab) twice daily for 11 weeks.  Should part two be 1 mg ( 1 tablet) once a day for 4 days??

## 2014-12-24 NOTE — Telephone Encounter (Signed)
Thank you for the clarification.  Yes, she should take 1 mg once a day for 4 days before going to 1 mg twice daily for 11 weeks.  I have made this change and sent it in electronically.

## 2014-12-26 ENCOUNTER — Encounter: Payer: Self-pay | Admitting: Family

## 2014-12-27 ENCOUNTER — Ambulatory Visit: Payer: Self-pay | Admitting: Family

## 2014-12-27 ENCOUNTER — Inpatient Hospital Stay (HOSPITAL_COMMUNITY): Admission: RE | Admit: 2014-12-27 | Payer: Self-pay | Source: Ambulatory Visit

## 2015-01-06 ENCOUNTER — Ambulatory Visit (INDEPENDENT_AMBULATORY_CARE_PROVIDER_SITE_OTHER): Payer: Medicare Other | Admitting: Pharmacist

## 2015-01-06 DIAGNOSIS — T82868S Thrombosis of vascular prosthetic devices, implants and grafts, sequela: Secondary | ICD-10-CM

## 2015-01-06 DIAGNOSIS — Z7901 Long term (current) use of anticoagulants: Secondary | ICD-10-CM

## 2015-01-06 DIAGNOSIS — I779 Disorder of arteries and arterioles, unspecified: Secondary | ICD-10-CM

## 2015-01-06 LAB — POCT INR: INR: 2.9

## 2015-01-06 NOTE — Progress Notes (Signed)
Anti-Coagulation Progress Note  Jeanann Balinski is a 69 y.o. female who is currently on an anti-coagulation regimen.    RECENT RESULTS: Recent results are below, the most recent result is correlated with a dose of 28 mg. 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]        Comments         ANTICOAG TODAY: Anticoagulation Summary as of 01/06/2015    INR goal 2.0-3.0  Selected INR 2.90 (01/06/2015)  Next INR check 01/20/2015  Target end date    Indications  Long term current use of anticoagulant therapy [Z79.01] 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  Patient instructed to take today's dose.  Patient verbalized understanding of these instructions.       FOLLOW-UP Return in 2 weeks (on 01/20/2015) for Follow up INR at 2:45PM.  Jorene Guest, III Pharm.D., CACP

## 2015-01-06 NOTE — Patient Instructions (Signed)
Patient instructed to take medications as defined in the Anti-coagulation Track section of this encounter.  Patient instructed to take today's dose.  Patient verbalized understanding of these instructions.    

## 2015-01-08 NOTE — Progress Notes (Signed)
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 Agree with Dr. Gladstone Pih recommendations as outlined in his note.

## 2015-01-09 ENCOUNTER — Other Ambulatory Visit: Payer: Self-pay | Admitting: Internal Medicine

## 2015-01-09 DIAGNOSIS — I779 Disorder of arteries and arterioles, unspecified: Secondary | ICD-10-CM

## 2015-01-20 ENCOUNTER — Ambulatory Visit (INDEPENDENT_AMBULATORY_CARE_PROVIDER_SITE_OTHER): Payer: Medicare Other | Admitting: Pharmacist

## 2015-01-20 DIAGNOSIS — T82868S Thrombosis of vascular prosthetic devices, implants and grafts, sequela: Secondary | ICD-10-CM | POA: Diagnosis not present

## 2015-01-20 DIAGNOSIS — Z7901 Long term (current) use of anticoagulants: Secondary | ICD-10-CM | POA: Diagnosis not present

## 2015-01-20 DIAGNOSIS — I779 Disorder of arteries and arterioles, unspecified: Secondary | ICD-10-CM | POA: Diagnosis not present

## 2015-01-20 LAB — POCT INR: INR: 2.9

## 2015-01-20 MED ORDER — WARFARIN SODIUM 4 MG PO TABS: ORAL_TABLET | ORAL | Status: DC

## 2015-01-20 NOTE — Patient Instructions (Signed)
Patient instructed to take medications as defined in the Anti-coagulation Track section of this encounter.  Patient instructed to take today's dose.  Patient verbalized understanding of these instructions.    

## 2015-01-20 NOTE — Progress Notes (Signed)
  Anti-Coagulation Progress Note Kristin Knight is a 69 y.o. female who is currently on an anti-coagulation regimen.  RECENT RESULTS: Recent results are below, the most recent result is correlated with a dose of 26 mg. 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]        Comments        ANTICOAG TODAY: Anticoagulation Summary as of 01/20/2015    INR goal 2.0-3.0  Selected INR 2.90 (01/20/2015)  Next INR check 02/10/2015  Target end date    Indications  Long term current use of anticoagulant therapy [Z79.01] 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 Patient instructed to take today's dose. Patient verbalized understanding of these instructions.    FOLLOW-UP Return in about 3 weeks (around 02/10/2015) for Follow-up INR at 2:15pm.  Milus Glazier, PharmD Clinical Pharmacy Resident Pager: [phone] 01/20/2015 3:47 PM

## 2015-01-24 ENCOUNTER — Ambulatory Visit (HOSPITAL_COMMUNITY)
Admission: RE | Admit: 2015-01-24 | Discharge: 2015-01-24 | Disposition: A | Discharge status: Home / Self Care | Payer: Medicare Other | Source: Ambulatory Visit | Attending: Internal Medicine | Admitting: Internal Medicine

## 2015-01-24 DIAGNOSIS — I739 Peripheral vascular disease, unspecified: Secondary | ICD-10-CM | POA: Diagnosis not present

## 2015-01-24 DIAGNOSIS — M549 Dorsalgia, unspecified: Secondary | ICD-10-CM | POA: Insufficient documentation

## 2015-01-24 DIAGNOSIS — I779 Disorder of arteries and arterioles, unspecified: Secondary | ICD-10-CM

## 2015-01-31 ENCOUNTER — Encounter: Payer: Self-pay | Admitting: Internal Medicine

## 2015-01-31 ENCOUNTER — Ambulatory Visit (INDEPENDENT_AMBULATORY_CARE_PROVIDER_SITE_OTHER): Payer: Medicare Other | Admitting: Internal Medicine

## 2015-01-31 VITALS — BP 146/80 | HR 67 | Temp 98.0°F | Wt 138.2 lb

## 2015-01-31 DIAGNOSIS — M810 Age-related osteoporosis without current pathological fracture: Secondary | ICD-10-CM | POA: Diagnosis not present

## 2015-01-31 DIAGNOSIS — F331 Major depressive disorder, recurrent, moderate: Secondary | ICD-10-CM | POA: Diagnosis not present

## 2015-01-31 DIAGNOSIS — Z720 Tobacco use: Secondary | ICD-10-CM

## 2015-01-31 DIAGNOSIS — I872 Venous insufficiency (chronic) (peripheral): Secondary | ICD-10-CM | POA: Diagnosis not present

## 2015-01-31 DIAGNOSIS — I10 Essential (primary) hypertension: Secondary | ICD-10-CM | POA: Diagnosis not present

## 2015-01-31 DIAGNOSIS — Z23 Encounter for immunization: Secondary | ICD-10-CM

## 2015-01-31 DIAGNOSIS — M545 Low back pain: Secondary | ICD-10-CM | POA: Diagnosis not present

## 2015-01-31 DIAGNOSIS — I779 Disorder of arteries and arterioles, unspecified: Secondary | ICD-10-CM

## 2015-01-31 DIAGNOSIS — G8929 Other chronic pain: Secondary | ICD-10-CM

## 2015-01-31 DIAGNOSIS — T82868S Thrombosis of vascular prosthetic devices, implants and grafts, sequela: Secondary | ICD-10-CM | POA: Diagnosis not present

## 2015-01-31 HISTORY — DX: Venous insufficiency (chronic) (peripheral): I87.2

## 2015-01-31 MED ORDER — OXYCODONE-ACETAMINOPHEN 7.5-325 MG PO TABS: 1.0000 | ORAL_TABLET | Freq: Three times a day (TID) | ORAL | Status: DC | PRN

## 2015-01-31 NOTE — Assessment & Plan Note (Signed)
She is tolerating the pravastatin well and this will be continued. We will reassess her hypertension after quitting smoking. Tobacco cessation is the key risk factor at this time and she is working on it.

## 2015-01-31 NOTE — Assessment & Plan Note (Signed)
She is tolerating the calcium vitamin D and weekly alendronate without difficulty. We will continue with this therapy.

## 2015-01-31 NOTE — Assessment & Plan Note (Signed)
A recent ultrasound failed to demonstrate any intra-abdominal source of her pain including a aortic aneurysm. A spine film demonstrated an old compression fracture which is been stable over the years. Therefore, her pain is likely musculoskeletal in nature. It improved with the increase in the baclofen dose to 20 mg every 8 hours as needed. She states when she takes 2 Roxicet twice a day she is able to get through her daily activities with good relief. We will therefore continue the baclofen at 20 mg by mouth every 8 hours as needed for back pain and increase the oxycodone to 7.5 mg-325 mg 1 tablet every 8 hours as needed for back pain dispense #60. We will reassess the efficacy of this regimen at the follow-up visit. She was given 3 prescriptions for the Percocet given the fact that she must pay someone to come to the clinic every month to pick them up. I feel confident she is not abusing or diverting this medication and therefore that is why she received the 3 prescriptions to make it more convenient and cost effective for her. In the past, physical therapy has had minimal effect in improving her symptoms and was also costly for her.

## 2015-01-31 NOTE — Assessment & Plan Note (Signed)
Her blood pressure today was 146/80 on lisinopril 20 mg by mouth daily and triamterene-hydrochlorothiazide 37.5-25 mg daily. She preferred to see what her blood pressure would be once she quit smoking. She plans on doing this in the interim and we can reassess her blood pressure at the return appointment, hopefully while she has quit smoking. If the blood pressure is elevated at that time we can increase the lisinopril to 40 mg by mouth daily.

## 2015-01-31 NOTE — Assessment & Plan Note (Signed)
She remains on the warfarin which is managed in the anticoagulation clinic. We will continue with this therapy lifelong.

## 2015-01-31 NOTE — Progress Notes (Signed)
   Subjective:    Patient ID: Kristin Knight, female    DOB: Dec 30, 1945, 69 y.o.   MRN: [id number]  HPI  Please see the A&P for the status of the pt's chronic medical problems.  Review of Systems  Constitutional: Negative for activity change, appetite change and unexpected weight change.  Respiratory: Negative for chest tightness and shortness of breath.   Cardiovascular: Positive for leg swelling. Negative for chest pain and palpitations.       L > R worsens throughout day and resolves overnight  Gastrointestinal: Negative for nausea, vomiting, abdominal pain, diarrhea and constipation.  Musculoskeletal: Positive for myalgias and back pain. Negative for joint swelling, arthralgias and gait problem.  Skin: Negative for color change, pallor, rash and wound.  Psychiatric/Behavioral: Negative for dysphoric mood. The patient is not nervous/anxious.       Objective:   Physical Exam  Constitutional: She is oriented to person, place, and time. She appears well-developed and well-nourished. No distress.  HENT:  Head: Normocephalic and atraumatic.  Eyes: Conjunctivae are normal. Right eye exhibits no discharge. Left eye exhibits no discharge. No scleral icterus.  Cardiovascular: Normal rate, regular rhythm and normal heart sounds.  Exam reveals no gallop and no friction rub.   No murmur heard. Pulmonary/Chest: Effort normal and breath sounds normal. No respiratory distress. She has no wheezes. She has no rales.  Abdominal: Soft. Bowel sounds are normal. She exhibits no distension. There is no tenderness. There is no rebound and no guarding.  Musculoskeletal: Normal range of motion. She exhibits edema. She exhibits no tenderness.  Trace pitting edema L > R (usually asymmetric in this manner after arterial thrombi).  Neurological: She is alert and oriented to person, place, and time. She exhibits normal muscle tone.  Skin: Skin is warm and dry. No rash noted. She is not diaphoretic. No erythema.  No pallor.  Psychiatric: She has a normal mood and affect. Her behavior is normal. Judgment and thought content normal.  Nursing note and vitals reviewed.     Assessment & Plan:   Please see Problem Oriented Charting.

## 2015-01-31 NOTE — Assessment & Plan Note (Signed)
She was noted to have trace edema of the left lower extremity. This worsens throughout the day and resolves overnight. This is the leg in which she had the arterial thrombi. The asymmetry in her swelling has been present since. She has no calf tenderness and I do not believe this represents a deep venous thrombosis given how it resolves overnight when elevated. As it is asymptomatic we will not provide any therapy for this at this time. We will reassess her asymmetric chronic venous insufficiency at the follow-up visit.

## 2015-01-31 NOTE — Assessment & Plan Note (Signed)
She was given the Pneumovax 13 today. The Zostavax has been deferred secondary to cost. She is otherwise up-to-date on her health care maintenance.

## 2015-01-31 NOTE — Assessment & Plan Note (Signed)
She recently started the Chantix and will be at the 1 mg twice daily dose tomorrow. She has noted a decrease in the craving since starting the Chantix. She is excited about the possibility of smoking cessation on this medication. She was praised for her willingness to quit smoking and encouraged to complete the program given the tremendous health benefits she may accrue from smoking cessation. We will reassess her success at the follow-up visit.

## 2015-01-31 NOTE — Patient Instructions (Signed)
It was great to see you again!  I am so happy your cravings for cigarettes are decreasing on the Chantix.  1) Keep working on quitting smoking.  This will also help your blood pressure.  2) We increased the oxycodone-acetaminophen to 7.5 mg-325 mg 1 tablet every 8 hours as needed (#60).  3) Keep taking all of your other medications as you are.  4) We will check the blood pressure in 3 months, if still elevated we will increase one of your blood pressure medications.  5) We gave you the pneumonia shot today.  I will see you back in 3 months for a blood pressure check.  Sooner if necessary.

## 2015-01-31 NOTE — Assessment & Plan Note (Signed)
She notes her depression is very well controlled at this time on the mirtazapine. She would like to continue the mirtazapine at 15 mg by mouth each night. She is aware that if she wanted to assess her mood off of the mirtazapine in the future this could be done.

## 2015-02-10 ENCOUNTER — Ambulatory Visit (INDEPENDENT_AMBULATORY_CARE_PROVIDER_SITE_OTHER): Payer: Medicare Other | Admitting: Pharmacist

## 2015-02-10 DIAGNOSIS — T82868S Thrombosis of vascular prosthetic devices, implants and grafts, sequela: Secondary | ICD-10-CM | POA: Diagnosis not present

## 2015-02-10 DIAGNOSIS — Z7901 Long term (current) use of anticoagulants: Secondary | ICD-10-CM | POA: Diagnosis not present

## 2015-02-10 DIAGNOSIS — I779 Disorder of arteries and arterioles, unspecified: Secondary | ICD-10-CM | POA: Diagnosis not present

## 2015-02-10 LAB — POCT INR: INR: 2

## 2015-02-10 NOTE — Progress Notes (Addendum)
  Anti-Coagulation Progress Note Kristin Knight is a 69 y.o. female who is currently on an anti-coagulation regimen.  RECENT RESULTS: Recent results are below, the most recent result is correlated with a dose of 24 mg. 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]        Comments        ANTICOAG TODAY: Anticoagulation Summary as of 02/10/2015    INR goal 2.0-3.0  Selected INR 2.0 (02/10/2015)  Next INR check 03/03/2015  Target end date    Indications  Long term current use of anticoagulant therapy [Z79.01] 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 Patient instructed to take today's dose. Patient verbalized understanding of these instructions.    FOLLOW-UP Return in about 3 weeks (around 03/03/2015) for Follow-up INR at 2:30pm.  Horatio Pel Clinical Pharmacy Resident 02/10/2015 3:05 PM  I agree with the assessment, diagnosis, and plan of care documented by the pharmacy resident.

## 2015-02-10 NOTE — Patient Instructions (Signed)
Patient instructed to take medications as defined in the Anti-coagulation Track section of this encounter.  Patient instructed to take today's dose.  Patient verbalized understanding of these instructions.    

## 2015-02-12 NOTE — Addendum Note (Signed)
Addended by: Forde Dandy on: 02/12/2015 10:06 AM   Modules accepted: Level of Service

## 2015-02-26 ENCOUNTER — Encounter: Payer: Self-pay | Admitting: *Deleted

## 2015-03-03 ENCOUNTER — Ambulatory Visit: Payer: Self-pay

## 2015-03-09 ENCOUNTER — Other Ambulatory Visit: Payer: Self-pay | Admitting: Internal Medicine

## 2015-03-09 DIAGNOSIS — F331 Major depressive disorder, recurrent, moderate: Secondary | ICD-10-CM

## 2015-03-10 ENCOUNTER — Ambulatory Visit: Payer: Self-pay

## 2015-03-26 ENCOUNTER — Ambulatory Visit (INDEPENDENT_AMBULATORY_CARE_PROVIDER_SITE_OTHER): Payer: Medicare Other | Admitting: Internal Medicine

## 2015-03-26 ENCOUNTER — Encounter: Payer: Self-pay | Admitting: Internal Medicine

## 2015-03-26 VITALS — BP 130/80 | HR 65 | Temp 98.3°F | Wt 129.1 lb

## 2015-03-26 DIAGNOSIS — M545 Low back pain, unspecified: Secondary | ICD-10-CM

## 2015-03-26 DIAGNOSIS — F331 Major depressive disorder, recurrent, moderate: Secondary | ICD-10-CM

## 2015-03-26 DIAGNOSIS — Z720 Tobacco use: Secondary | ICD-10-CM

## 2015-03-26 DIAGNOSIS — F17210 Nicotine dependence, cigarettes, uncomplicated: Secondary | ICD-10-CM

## 2015-03-26 DIAGNOSIS — I779 Disorder of arteries and arterioles, unspecified: Secondary | ICD-10-CM

## 2015-03-26 DIAGNOSIS — Z7901 Long term (current) use of anticoagulants: Secondary | ICD-10-CM | POA: Diagnosis not present

## 2015-03-26 DIAGNOSIS — T82867S Thrombosis of cardiac prosthetic devices, implants and grafts, sequela: Secondary | ICD-10-CM

## 2015-03-26 DIAGNOSIS — Z0000 Encounter for general adult medical examination without abnormal findings: Secondary | ICD-10-CM

## 2015-03-26 DIAGNOSIS — Y832 Surgical operation with anastomosis, bypass or graft as the cause of abnormal reaction of the patient, or of later complication, without mention of misadventure at the time of the procedure: Secondary | ICD-10-CM

## 2015-03-26 DIAGNOSIS — G8929 Other chronic pain: Secondary | ICD-10-CM

## 2015-03-26 DIAGNOSIS — I10 Essential (primary) hypertension: Secondary | ICD-10-CM

## 2015-03-26 DIAGNOSIS — I739 Peripheral vascular disease, unspecified: Secondary | ICD-10-CM

## 2015-03-26 DIAGNOSIS — M810 Age-related osteoporosis without current pathological fracture: Secondary | ICD-10-CM | POA: Diagnosis not present

## 2015-03-26 DIAGNOSIS — T82868S Thrombosis of vascular prosthetic devices, implants and grafts, sequela: Secondary | ICD-10-CM

## 2015-03-26 MED ORDER — OXYCODONE-ACETAMINOPHEN 7.5-325 MG PO TABS: 1.0000 | ORAL_TABLET | Freq: Three times a day (TID) | ORAL | Status: DC | PRN

## 2015-03-26 NOTE — Progress Notes (Signed)
   Subjective:    Patient ID: Kristin Knight, female    DOB: Mar 29, 1946, 69 y.o.   MRN: [id number]  HPI  Kristin Knight is here for follow-up of her chronic back pain, hypertension, and major depression. Please see the A&P for the status of the pt's chronic medical problems.  Review of Systems  Constitutional: Positive for appetite change and unexpected weight change. Negative for activity change.       Lost some weight briefly with decreased appetite when she ran out of her mirtazapine.  Respiratory: Negative for chest tightness, shortness of breath and wheezing.   Cardiovascular: Negative for chest pain, palpitations and leg swelling.  Gastrointestinal: Negative for nausea, vomiting, abdominal pain, diarrhea and constipation.  Musculoskeletal: Positive for back pain. Negative for myalgias, joint swelling, arthralgias and gait problem.       Chronic back pain subjectively improved on pharmacotherapy.  Skin: Negative for rash and wound.  Psychiatric/Behavioral: Negative for dysphoric mood. The patient is not nervous/anxious.       Objective:   Physical Exam  Constitutional: She is oriented to person, place, and time. She appears well-developed and well-nourished. No distress.  HENT:  Head: Normocephalic and atraumatic.  Eyes: Conjunctivae are normal. Right eye exhibits no discharge. Left eye exhibits no discharge. No scleral icterus.  Cardiovascular: Normal rate, regular rhythm and normal heart sounds.  Exam reveals no gallop and no friction rub.   No murmur heard. Pulmonary/Chest: Effort normal and breath sounds normal. No respiratory distress. She has no wheezes. She has no rales.  Abdominal: Soft. Bowel sounds are normal. She exhibits no distension. There is no tenderness. There is no rebound and no guarding.  Musculoskeletal: Normal range of motion. She exhibits no edema or tenderness.  Neurological: She is alert and oriented to person, place, and time. She exhibits normal muscle  tone.  Skin: Skin is warm and dry. No rash noted. She is not diaphoretic. No erythema.  Psychiatric: She has a normal mood and affect. Her behavior is normal. Judgment and thought content normal.  Nursing note and vitals reviewed.     Assessment & Plan:   Please see problem oriented charting.

## 2015-03-26 NOTE — Assessment & Plan Note (Signed)
She continues to smoke but less than before. Her cravings for cigarettes are decreased on the Chantix. Unfortunately, she recently ran out of the Chantix during a move to a new apartment. She therefore is continuing to smoke. She was interested in continuing the Chantix and I encouraged her to do so. She again was praised for her willingness to try to quit smoking and her success will be reassessed at the follow-up visit.

## 2015-03-26 NOTE — Assessment & Plan Note (Signed)
She is tolerating the alendronate, calcium and vitamin D well. These will be continued.

## 2015-03-26 NOTE — Assessment & Plan Note (Signed)
At the last clinic visit she was still having back pain and baclofen 20 mg by mouth 3 times daily as needed and Percocet. The dose of the Percocet was increased to 7.5-325 mg 1 tablet every 8 hours dispense #60 and with this escalation her chronic back pain is finally under adequate control to allow her to enjoy and perform her daily activities. We will therefore continue the baclofen 20 mg by mouth every 8 hours as needed and Percocet 7.5-325 mg 1 tablet every 8 hours as needed for pain. She was given an additional 2 written prescriptions for the Percocet given the difficulty she has in coming to the clinic to pick them up monthly.

## 2015-03-26 NOTE — Assessment & Plan Note (Signed)
Her blood pressure today was 130/80 on lisinopril 20 mg by mouth daily and Maxide 37.5-25 mg daily. Given the control of her blood pressure we will continue these medications at their current doses. We will reassess her control of her hypertension at the follow-up visit.

## 2015-03-26 NOTE — Assessment & Plan Note (Signed)
She wishes to defer the Zostavax at this time given the costs. This will be readdressed at the follow-up visit. She is otherwise up-to-date on her health care maintenance.

## 2015-03-26 NOTE — Assessment & Plan Note (Signed)
Her depression is well controlled on mirtazapine 15 mg by mouth at night. She recently ran out of the medication and briefly had a worsening in her mood, a decrease in her appetite, and loss of weight. This resolved with reinstitution of the mirtazapine. Therefore she feels she needs to continue on this medication. We will continue the mirtazapine at 15 mg by mouth at night and reassess the control of her major depression at the follow-up visit.

## 2015-03-26 NOTE — Assessment & Plan Note (Signed)
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 She is followed closely in the anticoagulation clinic and will continue to do so.

## 2015-03-26 NOTE — Assessment & Plan Note (Signed)
She's had no symptoms consistent with claudication. We are continuing aggressive risk factor modification with pravastatin 20 mg by mouth daily for her lipids and her antihypertensives as noted above. We are also working very hard on smoking cessation.

## 2015-03-26 NOTE — Patient Instructions (Signed)
It was great to see you again.  You are doing a fantastic job with your health.  Please keep up the great work.  1) Keep taking the medications as you are.  2) Keep working on trying to quit smoking.  The Chantix should help with that.  I will see you back in 6 months, sooner if necessary.

## 2015-03-31 ENCOUNTER — Ambulatory Visit (INDEPENDENT_AMBULATORY_CARE_PROVIDER_SITE_OTHER): Payer: Medicare Other | Admitting: Pharmacist

## 2015-03-31 DIAGNOSIS — Z7901 Long term (current) use of anticoagulants: Secondary | ICD-10-CM

## 2015-03-31 DIAGNOSIS — I779 Disorder of arteries and arterioles, unspecified: Secondary | ICD-10-CM

## 2015-03-31 DIAGNOSIS — T82868D Thrombosis of vascular prosthetic devices, implants and grafts, subsequent encounter: Secondary | ICD-10-CM

## 2015-03-31 DIAGNOSIS — Y832 Surgical operation with anastomosis, bypass or graft as the cause of abnormal reaction of the patient, or of later complication, without mention of misadventure at the time of the procedure: Secondary | ICD-10-CM

## 2015-03-31 LAB — POCT INR: INR: 2.8

## 2015-03-31 NOTE — Progress Notes (Signed)
Anti-Coagulation Progress Note  Kristin Knight is a 69 y.o. female who is currently on an anti-coagulation regimen.    RECENT RESULTS: Recent results are below, the most recent result is correlated with a dose of 26 mg. 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]        Comments         ANTICOAG TODAY: Anticoagulation Summary as of 03/31/2015    INR goal 2.0-3.0  Selected INR 2.80 (03/31/2015)  Next INR check 04/28/2015  Target end date    Indications  Long term current use of anticoagulant therapy [Z79.01] 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  Patient instructed to take today's dose.  Patient verbalized understanding of these instructions.       FOLLOW-UP Return in 4 weeks (on 04/28/2015) for Follow up INR at 2:30PM.  Jorene Guest, III Pharm.D., CACP

## 2015-03-31 NOTE — Patient Instructions (Signed)
Patient instructed to take medications as defined in the Anti-coagulation Track section of this encounter.  Patient instructed to take today's dose.  Patient verbalized understanding of these instructions.    

## 2015-04-01 ENCOUNTER — Emergency Department (HOSPITAL_COMMUNITY): Payer: Medicare Other

## 2015-04-01 ENCOUNTER — Emergency Department (HOSPITAL_COMMUNITY)
Admission: EM | Admit: 2015-04-01 | Discharge: 2015-04-01 | Disposition: A | Discharge status: Home / Self Care | Payer: Medicare Other | Attending: Emergency Medicine | Admitting: Emergency Medicine

## 2015-04-01 ENCOUNTER — Encounter (HOSPITAL_COMMUNITY): Payer: Self-pay | Admitting: Emergency Medicine

## 2015-04-01 ENCOUNTER — Other Ambulatory Visit: Payer: Self-pay

## 2015-04-01 DIAGNOSIS — Z880 Allergy status to penicillin: Secondary | ICD-10-CM | POA: Diagnosis not present

## 2015-04-01 DIAGNOSIS — Z720 Tobacco use: Secondary | ICD-10-CM | POA: Insufficient documentation

## 2015-04-01 DIAGNOSIS — Z8719 Personal history of other diseases of the digestive system: Secondary | ICD-10-CM | POA: Diagnosis not present

## 2015-04-01 DIAGNOSIS — I10 Essential (primary) hypertension: Secondary | ICD-10-CM | POA: Insufficient documentation

## 2015-04-01 DIAGNOSIS — Z86018 Personal history of other benign neoplasm: Secondary | ICD-10-CM | POA: Diagnosis not present

## 2015-04-01 DIAGNOSIS — F17210 Nicotine dependence, cigarettes, uncomplicated: Secondary | ICD-10-CM | POA: Diagnosis not present

## 2015-04-01 DIAGNOSIS — J441 Chronic obstructive pulmonary disease with (acute) exacerbation: Secondary | ICD-10-CM | POA: Diagnosis not present

## 2015-04-01 DIAGNOSIS — Z79899 Other long term (current) drug therapy: Secondary | ICD-10-CM | POA: Insufficient documentation

## 2015-04-01 DIAGNOSIS — R0602 Shortness of breath: Secondary | ICD-10-CM | POA: Diagnosis not present

## 2015-04-01 DIAGNOSIS — M810 Age-related osteoporosis without current pathological fracture: Secondary | ICD-10-CM | POA: Diagnosis not present

## 2015-04-01 LAB — BASIC METABOLIC PANEL
Anion gap: 11 (ref 5–15)
BUN: 13 mg/dL (ref 6–20)
CALCIUM: 9.3 mg/dL (ref 8.9–10.3)
CO2: 28 mmol/L (ref 22–32)
CREATININE: 1.19 mg/dL — AB (ref 0.44–1.00)
Chloride: 101 mmol/L (ref 101–111)
GFR calc Af Amer: 53 mL/min — ABNORMAL LOW (ref >60)
GFR calc non Af Amer: 46 mL/min — ABNORMAL LOW (ref >60)
GLUCOSE: 116 mg/dL — AB (ref 65–99)
Potassium: 3.5 mmol/L (ref 3.5–5.1)
Sodium: 140 mmol/L (ref 135–145)

## 2015-04-01 LAB — PROTIME-INR
INR: 2.45 — AB (ref 0.00–1.49)
Prothrombin Time: 26.8 seconds — ABNORMAL HIGH (ref 11.6–15.2)

## 2015-04-01 LAB — CBC WITH DIFFERENTIAL/PLATELET
BASOS ABS: 0 10*3/uL (ref 0.0–0.1)
Basophils Relative: 1 % (ref 0–1)
EOS ABS: 0.2 10*3/uL (ref 0.0–0.7)
EOS PCT: 5 % (ref 0–5)
HEMATOCRIT: 39.1 % (ref 36.0–46.0)
HEMOGLOBIN: 12.2 g/dL (ref 12.0–15.0)
Lymphocytes Relative: 50 % — ABNORMAL HIGH (ref 12–46)
Lymphs Abs: 2 10*3/uL (ref 0.7–4.0)
MCH: 28.5 pg (ref 26.0–34.0)
MCHC: 31.2 g/dL (ref 30.0–36.0)
MCV: 91.4 fL (ref 78.0–100.0)
MONOS PCT: 10 % (ref 3–12)
Monocytes Absolute: 0.4 10*3/uL (ref 0.1–1.0)
NEUTROS ABS: 1.4 10*3/uL — AB (ref 1.7–7.7)
Neutrophils Relative %: 34 % — ABNORMAL LOW (ref 43–77)
Platelets: 173 10*3/uL (ref 150–400)
RBC: 4.28 MIL/uL (ref 3.87–5.11)
RDW: 14.1 % (ref 11.5–15.5)
WBC: 4 10*3/uL (ref 4.0–10.5)

## 2015-04-01 LAB — BRAIN NATRIURETIC PEPTIDE: B Natriuretic Peptide: 34.3 pg/mL (ref 0.0–100.0)

## 2015-04-01 LAB — TROPONIN I: Troponin I: <0.03 ng/mL (ref <0.031)

## 2015-04-01 MED ORDER — PREDNISONE 20 MG PO TABS
60.0000 mg | ORAL_TABLET | Freq: Once | ORAL | Status: AC
  Administered 2015-04-01: 60 mg via ORAL
  Filled 2015-04-01: qty 3

## 2015-04-01 MED ORDER — PREDNISONE 20 MG PO TABS
60.0000 mg | ORAL_TABLET | Freq: Every day | ORAL | Status: DC
Start: 2015-04-01 — End: 2015-06-23

## 2015-04-01 MED ORDER — IPRATROPIUM-ALBUTEROL 0.5-2.5 (3) MG/3ML IN SOLN
3.0000 mL | Freq: Once | RESPIRATORY_TRACT | Status: AC
  Administered 2015-04-01: 3 mL via RESPIRATORY_TRACT
  Filled 2015-04-01: qty 3

## 2015-04-01 MED ORDER — ALBUTEROL SULFATE HFA 108 (90 BASE) MCG/ACT IN AERS: 2.0000 | INHALATION_SPRAY | RESPIRATORY_TRACT | Status: DC | PRN

## 2015-04-01 NOTE — Discharge Instructions (Signed)
Chronic Obstructive Pulmonary Disease  Chronic obstructive pulmonary disease (COPD) is a common lung condition in which airflow from the lungs is limited. COPD is a general term that can be used to describe many different lung problems that limit airflow, including both chronic bronchitis and emphysema. If you have COPD, your lung function will probably never return to normal, but there are measures you can take to improve lung function and make yourself feel better.   CAUSES    Smoking (common).    Exposure to secondhand smoke.    Genetic problems.   Chronic inflammatory lung diseases or recurrent infections.  SYMPTOMS    Shortness of breath, especially with physical activity.    Deep, persistent (chronic) cough with a large amount of thick mucus.    Wheezing.    Rapid breaths (tachypnea).    Gray or bluish discoloration (cyanosis) of the skin, especially in fingers, toes, or lips.    Fatigue.    Weight loss.    Frequent infections or episodes when breathing symptoms become much worse (exacerbations).    Chest tightness.  DIAGNOSIS   Your health care provider will take a medical history and perform a physical examination to make the initial diagnosis. Additional tests for COPD may include:    Lung (pulmonary) function tests.   Chest X-ray.   CT scan.   Blood tests.  TREATMENT   Treatment available to help you feel better when you have COPD includes:    Inhaler and nebulizer medicines. These help manage the symptoms of COPD and make your breathing more comfortable.   Supplemental oxygen. Supplemental oxygen is only helpful if you have a low oxygen level in your blood.    Exercise and physical activity. These are beneficial for nearly all people with COPD. Some people may also benefit from a pulmonary rehabilitation program.  HOME CARE INSTRUCTIONS    Take all medicines (inhaled or pills) as directed by your health care provider.   Avoid over-the-counter medicines or cough syrups  that dry up your airway (such as antihistamines) and slow down the elimination of secretions unless instructed otherwise by your health care provider.    If you are a smoker, the most important thing that you can do is stop smoking. Continuing to smoke will cause further lung damage and breathing trouble. Ask your health care provider for help with quitting smoking. He or she can direct you to community resources or hospitals that provide support.   Avoid exposure to irritants such as smoke, chemicals, and fumes that aggravate your breathing.   Use oxygen therapy and pulmonary rehabilitation if directed by your health care provider. If you require home oxygen therapy, ask your health care provider whether you should purchase a pulse oximeter to measure your oxygen level at home.    Avoid contact with individuals who have a contagious illness.   Avoid extreme temperature and humidity changes.   Eat healthy foods. Eating smaller, more frequent meals and resting before meals may help you maintain your strength.   Stay active, but balance activity with periods of rest. Exercise and physical activity will help you maintain your ability to do things you want to do.   Preventing infection and hospitalization is very important when you have COPD. Make sure to receive all the vaccines your health care provider recommends, especially the pneumococcal and influenza vaccines. Ask your health care provider whether you need a pneumonia vaccine.   Learn and use relaxation techniques to manage stress.   Learn   going to whistle and breathe out (exhale) through the pursed lips for 2 seconds.   Diaphragmatic breathing. Start by putting one hand on your abdomen just above  your waist. Inhale slowly through your nose. The hand on your abdomen should move out. Then purse your lips and exhale slowly. You should be able to feel the hand on your abdomen moving in as you exhale.   Learn and use controlled coughing to clear mucus from your lungs. Controlled coughing is a series of short, progressive coughs. The steps of controlled coughing are:  1. Lean your head slightly forward.  2. Breathe in deeply using diaphragmatic breathing.  3. Try to hold your breath for 3 seconds.  4. Keep your mouth slightly open while coughing twice.  5. Spit any mucus out into a tissue.  6. Rest and repeat the steps once or twice as needed. SEEK MEDICAL CARE IF:   You are coughing up more mucus than usual.   There is a change in the color or thickness of your mucus.   Your breathing is more labored than usual.   Your breathing is faster than usual.  SEEK IMMEDIATE MEDICAL CARE IF:   You have shortness of breath while you are resting.   You have shortness of breath that prevents you from:  Being able to talk.   Performing your usual physical activities.   You have chest pain lasting longer than 5 minutes.   Your skin color is more cyanotic than usual.  You measure low oxygen saturations for longer than 5 minutes with a pulse oximeter. MAKE SURE YOU:   Understand these instructions.  Will watch your condition.  Will get help right away if you are not doing well or get worse. Document Released: 08/11/2005 Document Revised: 03/18/2014 Document Reviewed: 06/28/2013 [facility] Patient Information 2015 [place], [place]. This information is not intended to replace advice given to you by your health care provider. Make sure you discuss any questions you have with your health care provider.  Albuterol inhalation aerosol What is this medicine? ALBUTEROL (al Norm[place]ean [place]) is a bronchodilator. It helps open up the airways in your lungs to make it easier to breathe.  This medicine is used to treat and to prevent bronchospasm. This medicine may be used for other purposes; ask your health care provider or pharmacist if you have questions. COMMON BRAND NAME(S): Proair HFA, Proventil, Proventil HFA, Respirol, Ventolin, Ventolin HFA What should I tell my health care provider before I take this medicine? They need to know if you have any of the following conditions: -diabetes -heart disease or irregular heartbeat -high blood pressure -pheochromocytoma -seizures -thyroid disease -an unusual or allergic reaction to albuterol, levalbuterol, sulfites, other medicines, foods, dyes, or preservatives -pregnant or trying to get pregnant -breast-feeding How should I use this medicine? This medicine is for inhalation through the mouth. Follow the directions on your prescription label. Take your medicine at regular intervals. Do not use more often than directed. Make sure that you are using your inhaler correctly. Ask you doctor or health care provider if you have any questions. Talk to your pediatrician regarding the use of this medicine in children. Special care may be needed. Overdosage: If you think you have taken too much of this medicine contact a poison control center or emergency room at once. NOTE: This medicine is only for you. Do not share this medicine with others. What if I miss a dose? If you miss a dose, use it as soon  as you can. If it is almost time for your next dose, use only that dose. Do not use double or extra doses. What may interact with this medicine? -anti-infectives like chloroquine and pentamidine -caffeine -cisapride -diuretics -medicines for colds -medicines for depression or for emotional or psychotic conditions -medicines for weight loss including some herbal products -methadone -some antibiotics like clarithromycin, erythromycin, levofloxacin, and linezolid -some heart medicines -steroid hormones like dexamethasone, cortisone,  hydrocortisone -theophylline -thyroid hormones This list may not describe all possible interactions. Give your health care provider a list of all the medicines, herbs, non-prescription drugs, or dietary supplements you use. Also tell them if you smoke, drink alcohol, or use illegal drugs. Some items may interact with your medicine. What should I watch for while using this medicine? Tell your doctor or health care professional if your symptoms do not improve. Do not use extra albuterol. If your asthma or bronchitis gets worse while you are using this medicine, call your doctor right away. If your mouth gets dry try chewing sugarless gum or sucking hard candy. Drink water as directed. What side effects may I notice from receiving this medicine? Side effects that you should report to your doctor or health care professional as soon as possible: -allergic reactions like skin rash, itching or hives, swelling of the face, lips, or tongue -breathing problems -chest pain -feeling faint or lightheaded, falls -high blood pressure -irregular heartbeat -fever -muscle cramps or weakness -pain, tingling, numbness in the hands or feet -vomiting Side effects that usually do not require medical attention (report to your doctor or health care professional if they continue or are bothersome): -cough -difficulty sleeping -headache -nervousness or trembling -stomach upset -stuffy or runny nose -throat irritation -unusual taste This list may not describe all possible side effects. Call your doctor for medical advice about side effects. You may report side effects to FDA at 1-800-FDA-1088. Where should I keep my medicine? Keep out of the reach of children. Store at room temperature between 15 and 30 degrees C (59 and 86 degrees F). The contents are under pressure and may burst when exposed to heat or flame. Do not freeze. This medicine does not work as well if it is too cold. Throw away any unused medicine  after the expiration date. Inhalers need to be thrown away after the labeled number of puffs have been used or by the expiration date; whichever comes first. Ventolin HFA should be thrown away 12 months after removing from foil pouch. Check the instructions that come with your medicine. NOTE: This sheet is a summary. It may not cover all possible information. If you have questions about this medicine, talk to your doctor, pharmacist, or health care provider.  2015, [employer]. (2013-04-19 10:57:17)  Prednisone tablets What is this medicine? PREDNISONE (PRED ni sone) is a corticosteroid. It is commonly used to treat inflammation of the skin, joints, lungs, and other organs. Common conditions treated include asthma, allergies, and arthritis. It is also used for other conditions, such as blood disorders and diseases of the adrenal glands. This medicine may be used for other purposes; ask your health care provider or pharmacist if you have questions. COMMON BRAND NAME(S): Deltasone, Predone, Sterapred, Sterapred DS What should I tell my health care provider before I take this medicine? They need to know if you have any of these conditions: -Cushing's syndrome -diabetes -glaucoma -heart disease -high blood pressure -infection (especially a virus infection such as chickenpox, cold sores, or herpes) -kidney disease -liver disease -  mental illness -myasthenia gravis -osteoporosis -seizures -stomach or intestine problems -thyroid disease -an unusual or allergic reaction to lactose, prednisone, other medicines, foods, dyes, or preservatives -pregnant or trying to get pregnant -breast-feeding How should I use this medicine? Take this medicine by mouth with a glass of water. Follow the directions on the prescription label. Take this medicine with food. If you are taking this medicine once a day, take it in the morning. Do not take more medicine than you are told to take. Do not suddenly  stop taking your medicine because you may develop a severe reaction. Your doctor will tell you how much medicine to take. If your doctor wants you to stop the medicine, the dose may be slowly lowered over time to avoid any side effects. Talk to your pediatrician regarding the use of this medicine in children. Special care may be needed. Overdosage: If you think you have taken too much of this medicine contact a poison control center or emergency room at once. NOTE: This medicine is only for you. Do not share this medicine with others. What if I miss a dose? If you miss a dose, take it as soon as you can. If it is almost time for your next dose, talk to your doctor or health care professional. You may need to miss a dose or take an extra dose. Do not take double or extra doses without advice. What may interact with this medicine? Do not take this medicine with any of the following medications: -metyrapone -mifepristone This medicine may also interact with the following medications: -aminoglutethimide -amphotericin B -aspirin and aspirin-like medicines -barbiturates -certain medicines for diabetes, like glipizide or glyburide -cholestyramine -cholinesterase inhibitors -cyclosporine -digoxin -diuretics -ephedrine -female hormones, like estrogens and birth control pills -isoniazid -ketoconazole -NSAIDS, medicines for pain and inflammation, like ibuprofen or naproxen -phenytoin -rifampin -toxoids -vaccines -warfarin This list may not describe all possible interactions. Give your health care provider a list of all the medicines, herbs, non-prescription drugs, or dietary supplements you use. Also tell them if you smoke, drink alcohol, or use illegal drugs. Some items may interact with your medicine. What should I watch for while using this medicine? Visit your doctor or health care professional for regular checks on your progress. If you are taking this medicine over a prolonged period,  carry an identification card with your name and address, the type and dose of your medicine, and your doctor's name and address. This medicine may increase your risk of getting an infection. Tell your doctor or health care professional if you are around anyone with measles or chickenpox, or if you develop sores or blisters that do not heal properly. If you are going to have surgery, tell your doctor or health care professional that you have taken this medicine within the last twelve months. Ask your doctor or health care professional about your diet. You may need to lower the amount of salt you eat. This medicine may affect blood sugar levels. If you have diabetes, check with your doctor or health care professional before you change your diet or the dose of your diabetic medicine. What side effects may I notice from receiving this medicine? Side effects that you should report to your doctor or health care professional as soon as possible: -allergic reactions like skin rash, itching or hives, swelling of the face, lips, or tongue -changes in emotions or moods -changes in vision -depressed mood -eye pain -fever or chills, cough, sore throat, pain or difficulty passing urine -increased  thirst -swelling of ankles, feet Side effects that usually do not require medical attention (report to your doctor or health care professional if they continue or are bothersome): -confusion, excitement, restlessness -headache -nausea, vomiting -skin problems, acne, thin and shiny skin -trouble sleeping -weight gain This list may not describe all possible side effects. Call your doctor for medical advice about side effects. You may report side effects to FDA at 1-800-FDA-1088. Where should I keep my medicine? Keep out of the reach of children. Store at room temperature between 15 and 30 degrees C (59 and 86 degrees F). Protect from light. Keep container tightly closed. Throw away any unused medicine after the  expiration date. NOTE: This sheet is a summary. It may not cover all possible information. If you have questions about this medicine, talk to your doctor, pharmacist, or health care provider.  2015, [employer]. (2011-06-17 10:57:14)

## 2015-04-01 NOTE — ED Notes (Signed)
Ordered pt a breakfast tray - regular diet.
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
## 2015-04-01 NOTE — ED Provider Notes (Signed)
8:50 AM O2 sat  Remained in the 90s with ambulation. Heart rate remained in the 80s. The patient continues to appear well on exam. Plan for discharge home.  Clinical Impression 1. COPD exacerbation   2. Shortness of breath      Pamella Pert, MD 04/01/15 [phone]

## 2015-04-01 NOTE — ED Notes (Signed)
Pt. Given breakfast that she took home with her.  Pt. Ambulated without any sob , gait steady

## 2015-04-01 NOTE — ED Notes (Signed)
Patient ambulated around POD A.  02 at RA 98% stating.  02 RA stayed at 99% with HR at 88%.  Ambulated Patient twice around.

## 2015-04-01 NOTE — ED Notes (Signed)
Patient transported to X-ray 

## 2015-04-01 NOTE — ED Notes (Signed)
Pt here from home with c/o sob , pt sates that it is worse when she is moving around and lying back , pt is a smoker and has  Some chest pressure

## 2015-04-02 ENCOUNTER — Encounter: Payer: Self-pay | Admitting: Internal Medicine

## 2015-04-02 NOTE — Progress Notes (Signed)
I have reviewed Dr. Gladstone Pih note.  Kristin Knight is on anticoagulation for vascular graft thrombosis.

## 2015-04-04 ENCOUNTER — Encounter: Payer: Self-pay | Admitting: Internal Medicine

## 2015-04-04 ENCOUNTER — Ambulatory Visit (INDEPENDENT_AMBULATORY_CARE_PROVIDER_SITE_OTHER): Payer: Medicare Other | Admitting: Internal Medicine

## 2015-04-04 VITALS — BP 122/66 | HR 88 | Temp 97.9°F | Wt 131.6 lb

## 2015-04-04 DIAGNOSIS — F17210 Nicotine dependence, cigarettes, uncomplicated: Secondary | ICD-10-CM | POA: Diagnosis not present

## 2015-04-04 DIAGNOSIS — Z0000 Encounter for general adult medical examination without abnormal findings: Secondary | ICD-10-CM

## 2015-04-04 DIAGNOSIS — J449 Chronic obstructive pulmonary disease, unspecified: Secondary | ICD-10-CM

## 2015-04-04 DIAGNOSIS — I10 Essential (primary) hypertension: Secondary | ICD-10-CM | POA: Diagnosis not present

## 2015-04-04 DIAGNOSIS — Z720 Tobacco use: Secondary | ICD-10-CM

## 2015-04-04 LAB — BASIC METABOLIC PANEL WITHOUT GFR
BUN: 23 mg/dL (ref 6–23)
CO2: 29 meq/L (ref 19–32)
Calcium: 9.1 mg/dL (ref 8.4–10.5)
Chloride: 101 meq/L (ref 96–112)
Creat: 0.95 mg/dL (ref 0.50–1.10)
GFR, Est African American: 71 mL/min
GFR, Est Non African American: 62 mL/min
Glucose, Bld: 91 mg/dL (ref 70–99)
Potassium: 3.5 meq/L (ref 3.5–5.3)
Sodium: 137 meq/L (ref 135–145)

## 2015-04-04 NOTE — Assessment & Plan Note (Signed)
She had a mild COPD exacerbation and did not have bronchodilators immediately available at home. This necessitated assessment in the emergency department. With the bronchodilators given in the emergency department her symptoms immediately resolved. She has done well since discharge from the emergency department on a steroid burst and has only required her albuterol inhaler once. We discussed the importance of maintaining an albuterol inhaler in her home should she develop an exacerbation. We reviewed proper inhaler technique and she was given a new spacer to use. She will complete the steroid burst and continue to use the albuterol as needed. We will reassess her pulmonary symptoms at the follow-up visit.

## 2015-04-04 NOTE — Patient Instructions (Signed)
It was good to see you again.  I am glad to hear your breathing is much improved.  1) I provided you with a spacer for your inhaler.  We also reviewed the way to use the inhaler.  Do not hesitate to use the inhaler with the spacer when you get short of breath.  If it does not improve your shortness of breath please call the clinic for an immediate appointment or go to the emergency department.  2) I checked you blood today to make sure your kidneys are working well.  I will call you early next week when I get the results.  3) We checked an ECG today to make sure your heart was in normal rhythm.  4) Keep taking your other medications as you have been.  We will see you in 6 months, sooner if necessary.

## 2015-04-04 NOTE — Assessment & Plan Note (Signed)
In the emergency department a basic metabolic panel revealed a slight increase in her creatinine. We repeated a basic metabolic panel today to be assured her renal function returned to baseline. She was also noted to have an irregular heart rhythm on examination and it was unclear whether this was secondary to PVCs or PACs or atrial fibrillation. An ECG revealed a sinus arrhythmia with premature atrial contractions. There was no evidence of atrial fibrillation.

## 2015-04-04 NOTE — Assessment & Plan Note (Signed)
We will look into the cost of Zostavax for her to have this information available at her follow-up appointment. She can then decide if she is interested in receiving this prescription.

## 2015-04-04 NOTE — Progress Notes (Signed)
   Subjective:    Patient ID: Kristin Knight, female    DOB: 1946-08-01, 69 y.o.   MRN: [id number]  HPI  Kristin Knight is here for emergency department follow-up of a COPD exacerbation earlier this week. She states she developed the onset of a nonproductive cough associated with shortness of breath. She tried some cough syrup which helped the cough but did not completely resolve the dyspnea. As it was 5 in the morning she presented to the emergency department and was given an albuterol treatment with rapid relief of her dyspnea. She was discharged on as needed albuterol and a prednisone burst. She is now completing the prednisone burst and has only required the as needed albuterol once since discharge from the emergency department. She feels her breathing is back to baseline. Of note, she has a history of very mild chronic obstructive pulmonary disease and states she usually has a symptomatic exacerbation approximately once per year. Please see the A&P for the status of the pt's chronic medical problems.  Review of Systems  Constitutional: Negative for fever, activity change and appetite change.  HENT: Negative for congestion.   Respiratory: Negative for cough, chest tightness, shortness of breath and wheezing.   Cardiovascular: Negative for chest pain, palpitations and leg swelling.      Objective:   Physical Exam  Constitutional: She is oriented to person, place, and time. She appears well-developed and well-nourished. No distress.  HENT:  Head: Normocephalic and atraumatic.  Eyes: Conjunctivae are normal. Right eye exhibits no discharge. Left eye exhibits no discharge. No scleral icterus.  Cardiovascular: Normal rate.  Exam reveals no gallop and no friction rub.   No murmur heard. Irregular rhythm  Pulmonary/Chest: Effort normal and breath sounds normal. No respiratory distress. She has no wheezes. She has no rales.  Musculoskeletal: Normal range of motion. She exhibits no edema or  tenderness.  Neurological: She is alert and oriented to person, place, and time. She exhibits normal muscle tone.  Skin: Skin is warm and dry. She is not diaphoretic.  Psychiatric: She has a normal mood and affect. Her behavior is normal. Judgment and thought content normal.  Nursing note and vitals reviewed.     Assessment & Plan:   Please see problem-oriented charting.

## 2015-04-04 NOTE — Assessment & Plan Note (Signed)
She continues with the Chantix and states that she feels it is decreasing her cravings for nicotine. She is now down to approximately 8 cigarettes per day and does not smoke at all at night. She was encouraged to continue her efforts at smoking cessation. Chantix will be continued and we will reassess her success at tobacco cessation at the follow-up visit.

## 2015-04-07 NOTE — Progress Notes (Signed)
BMP reveals a decrease in her Cr to 0.95 with an eGFR of 71.  Her acute renal insufficiency has resolved.  Continue current medications.

## 2015-04-28 ENCOUNTER — Ambulatory Visit: Payer: Self-pay

## 2015-05-02 ENCOUNTER — Ambulatory Visit: Payer: Self-pay | Admitting: Internal Medicine

## 2015-05-15 ENCOUNTER — Other Ambulatory Visit: Payer: Self-pay | Admitting: *Deleted

## 2015-05-15 DIAGNOSIS — T82868S Thrombosis of vascular prosthetic devices, implants and grafts, sequela: Secondary | ICD-10-CM

## 2015-05-15 DIAGNOSIS — J449 Chronic obstructive pulmonary disease, unspecified: Secondary | ICD-10-CM

## 2015-05-15 MED ORDER — ALBUTEROL SULFATE HFA 108 (90 BASE) MCG/ACT IN AERS: 2.0000 | INHALATION_SPRAY | Freq: Four times a day (QID) | RESPIRATORY_TRACT | Status: DC | PRN

## 2015-05-15 MED ORDER — WARFARIN SODIUM 4 MG PO TABS
ORAL_TABLET | ORAL | Status: DC
Start: 2015-05-15 — End: 2015-08-24

## 2015-05-16 ENCOUNTER — Telehealth: Payer: Self-pay | Admitting: *Deleted

## 2015-05-16 NOTE — Telephone Encounter (Signed)
Pharmacy is asking if we can change to [employer] for this pt as insurance will pay for it.  Not albuterol.

## 2015-05-18 NOTE — Telephone Encounter (Signed)
Proair is albuterol.  Please clarify with the pharmacy what the exact issue is so I can better understand what they are asking.  Thanks.

## 2015-05-20 NOTE — Telephone Encounter (Signed)
  Pt has received this medication.

## 2015-06-07 ENCOUNTER — Other Ambulatory Visit: Payer: Self-pay | Admitting: Internal Medicine

## 2015-06-07 DIAGNOSIS — I10 Essential (primary) hypertension: Secondary | ICD-10-CM

## 2015-06-23 ENCOUNTER — Emergency Department (HOSPITAL_COMMUNITY): Payer: Medicare Other

## 2015-06-23 ENCOUNTER — Encounter (HOSPITAL_COMMUNITY): Payer: Self-pay | Admitting: *Deleted

## 2015-06-23 ENCOUNTER — Emergency Department (HOSPITAL_COMMUNITY)
Admission: EM | Admit: 2015-06-23 | Discharge: 2015-06-23 | Disposition: A | Discharge status: Home / Self Care | Payer: Medicare Other | Attending: Emergency Medicine | Admitting: Emergency Medicine

## 2015-06-23 DIAGNOSIS — R0602 Shortness of breath: Secondary | ICD-10-CM | POA: Diagnosis not present

## 2015-06-23 DIAGNOSIS — J449 Chronic obstructive pulmonary disease, unspecified: Secondary | ICD-10-CM | POA: Diagnosis not present

## 2015-06-23 DIAGNOSIS — J441 Chronic obstructive pulmonary disease with (acute) exacerbation: Secondary | ICD-10-CM | POA: Diagnosis not present

## 2015-06-23 DIAGNOSIS — Z720 Tobacco use: Secondary | ICD-10-CM | POA: Insufficient documentation

## 2015-06-23 DIAGNOSIS — F17210 Nicotine dependence, cigarettes, uncomplicated: Secondary | ICD-10-CM | POA: Diagnosis not present

## 2015-06-23 DIAGNOSIS — Z7901 Long term (current) use of anticoagulants: Secondary | ICD-10-CM | POA: Insufficient documentation

## 2015-06-23 DIAGNOSIS — Z880 Allergy status to penicillin: Secondary | ICD-10-CM | POA: Diagnosis not present

## 2015-06-23 DIAGNOSIS — F329 Major depressive disorder, single episode, unspecified: Secondary | ICD-10-CM | POA: Insufficient documentation

## 2015-06-23 DIAGNOSIS — Z86018 Personal history of other benign neoplasm: Secondary | ICD-10-CM | POA: Diagnosis not present

## 2015-06-23 DIAGNOSIS — I10 Essential (primary) hypertension: Secondary | ICD-10-CM | POA: Insufficient documentation

## 2015-06-23 DIAGNOSIS — Z79899 Other long term (current) drug therapy: Secondary | ICD-10-CM | POA: Diagnosis not present

## 2015-06-23 DIAGNOSIS — Z8619 Personal history of other infectious and parasitic diseases: Secondary | ICD-10-CM | POA: Insufficient documentation

## 2015-06-23 MED ORDER — PREDNISONE 20 MG PO TABS
60.0000 mg | ORAL_TABLET | Freq: Once | ORAL | Status: AC
  Administered 2015-06-23: 60 mg via ORAL
  Filled 2015-06-23: qty 3

## 2015-06-23 MED ORDER — ALBUTEROL SULFATE (2.5 MG/3ML) 0.083% IN NEBU
5.0000 mg | INHALATION_SOLUTION | Freq: Once | RESPIRATORY_TRACT | Status: AC
  Administered 2015-06-23: 5 mg via RESPIRATORY_TRACT

## 2015-06-23 MED ORDER — ALBUTEROL SULFATE (2.5 MG/3ML) 0.083% IN NEBU
5.0000 mg | INHALATION_SOLUTION | Freq: Once | RESPIRATORY_TRACT | Status: AC
  Administered 2015-06-23: 5 mg via RESPIRATORY_TRACT
  Filled 2015-06-23: qty 6

## 2015-06-23 MED ORDER — PREDNISONE 10 MG PO TABS: 50.0000 mg | ORAL_TABLET | Freq: Every day | ORAL | Status: DC

## 2015-06-23 MED ORDER — ALBUTEROL SULFATE (2.5 MG/3ML) 0.083% IN NEBU
INHALATION_SOLUTION | RESPIRATORY_TRACT | Status: AC
  Filled 2015-06-23: qty 6

## 2015-06-23 NOTE — ED Notes (Signed)
Pt alert x4 respirations easy non labored.  
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
## 2015-06-23 NOTE — Discharge Instructions (Signed)

## 2015-06-23 NOTE — ED Notes (Signed)
PT is here with sob and has history of COPD.  No wheezing heard, but sounds tight with rales to lower lobes.

## 2015-07-15 ENCOUNTER — Other Ambulatory Visit: Payer: Self-pay | Admitting: Internal Medicine

## 2015-07-15 ENCOUNTER — Other Ambulatory Visit: Payer: Self-pay | Admitting: *Deleted

## 2015-07-15 DIAGNOSIS — M545 Low back pain, unspecified: Secondary | ICD-10-CM

## 2015-07-15 DIAGNOSIS — G8929 Other chronic pain: Secondary | ICD-10-CM

## 2015-07-15 NOTE — Telephone Encounter (Signed)
Pt called requesting percocet  to be filled. °

## 2015-07-16 MED ORDER — OXYCODONE-ACETAMINOPHEN 7.5-325 MG PO TABS: 1.0000 | ORAL_TABLET | Freq: Three times a day (TID) | ORAL | Status: DC | PRN

## 2015-07-23 ENCOUNTER — Other Ambulatory Visit: Payer: Self-pay | Admitting: Internal Medicine

## 2015-07-23 DIAGNOSIS — G8929 Other chronic pain: Secondary | ICD-10-CM

## 2015-07-23 DIAGNOSIS — M545 Low back pain: Principal | ICD-10-CM

## 2015-07-23 NOTE — Telephone Encounter (Signed)
Pt called requesting percocet  to be filled. °

## 2015-07-23 NOTE — Telephone Encounter (Signed)
Last filled at pharm 8/3 Last visit 04/04/2015 Next appt 08/22/2015 Last UDS cannot find one, may have missed it Contract signed 2015

## 2015-07-24 NOTE — Telephone Encounter (Signed)
Prescriptions previously written, printed, and signed.  Present in her folder.  No need to refill at this time.

## 2015-07-24 NOTE — Telephone Encounter (Signed)
Pt called and will pick up today

## 2015-08-22 ENCOUNTER — Ambulatory Visit (INDEPENDENT_AMBULATORY_CARE_PROVIDER_SITE_OTHER): Payer: Medicare Other | Admitting: Internal Medicine

## 2015-08-22 ENCOUNTER — Encounter: Payer: Self-pay | Admitting: Internal Medicine

## 2015-08-22 VITALS — BP 138/84 | HR 62 | Temp 97.9°F | Wt 116.5 lb

## 2015-08-22 DIAGNOSIS — F17200 Nicotine dependence, unspecified, uncomplicated: Secondary | ICD-10-CM

## 2015-08-22 DIAGNOSIS — J449 Chronic obstructive pulmonary disease, unspecified: Secondary | ICD-10-CM | POA: Diagnosis not present

## 2015-08-22 DIAGNOSIS — T82868D Thrombosis of vascular prosthetic devices, implants and grafts, subsequent encounter: Secondary | ICD-10-CM | POA: Diagnosis not present

## 2015-08-22 DIAGNOSIS — Z720 Tobacco use: Secondary | ICD-10-CM

## 2015-08-22 DIAGNOSIS — G8929 Other chronic pain: Secondary | ICD-10-CM | POA: Diagnosis not present

## 2015-08-22 DIAGNOSIS — I779 Disorder of arteries and arterioles, unspecified: Secondary | ICD-10-CM

## 2015-08-22 DIAGNOSIS — F331 Major depressive disorder, recurrent, moderate: Secondary | ICD-10-CM

## 2015-08-22 DIAGNOSIS — Z0000 Encounter for general adult medical examination without abnormal findings: Secondary | ICD-10-CM

## 2015-08-22 DIAGNOSIS — I10 Essential (primary) hypertension: Secondary | ICD-10-CM | POA: Diagnosis not present

## 2015-08-22 DIAGNOSIS — I739 Peripheral vascular disease, unspecified: Secondary | ICD-10-CM

## 2015-08-22 DIAGNOSIS — M810 Age-related osteoporosis without current pathological fracture: Secondary | ICD-10-CM | POA: Diagnosis not present

## 2015-08-22 DIAGNOSIS — M545 Low back pain, unspecified: Secondary | ICD-10-CM

## 2015-08-22 DIAGNOSIS — Z7901 Long term (current) use of anticoagulants: Secondary | ICD-10-CM | POA: Diagnosis not present

## 2015-08-22 MED ORDER — OXYCODONE-ACETAMINOPHEN 7.5-325 MG PO TABS: 1.0000 | ORAL_TABLET | Freq: Three times a day (TID) | ORAL | Status: DC | PRN

## 2015-08-22 NOTE — Assessment & Plan Note (Signed)
She has been under a lot of stress both financially and emotionally over the last several months. Although she currently denies any depressive symptoms she has noted decreased appetite related to the stress and I suspect she has had some depression recurrence. That being said, she is much more positive today and feel things are getting better. She has responded very well to the mirtazapine in the past and we will continue with this at 15 mg by mouth at bedtime. We will reassess her depression at the follow-up visit.

## 2015-08-22 NOTE — Assessment & Plan Note (Signed)
She received the flu vaccination today. She is due for a hepatitis C evaluation. As no other blood is necessary at this time we will defer the hepatitis C check until she requires other blood work. At this time she is unable to afford the Zostavax. We will reassess the situation at the follow-up visit. She is otherwise up-to-date on her preventative health care maintenance.

## 2015-08-22 NOTE — Assessment & Plan Note (Signed)
Her chronic low back pain is well-controlled on the Percocet 7.5-325 mg 1 tablet by mouth every 8 hours as needed for pain. Now that she is working full time she feels this medication has been crucial to allowing her to put in all 8 hours. She does not feel she would be able to function at a full-time job without the [employer]. We will continue the Percocet given her response and improvement in function and ability to maintain full employment status with this medication. We will reassess her pain control at the follow-up visit. We will check a urine drug screen at the follow-up visit.

## 2015-08-22 NOTE — Patient Instructions (Signed)
It was great to see you again.  I am very sorry for all of your bad luck the last few months with your sister's death and all of the extra bills.  This likely lead to the increase in your smoking but I am glad you really want to try to stop smoking now.  1) Restart the Chantix as listed on the bottle.  2) Keep taking your other medications.  3) We gave you the flu shot today.  4) We will hold off on other medications for your lungs and concentrate on the smoking.  I think this would be more important than any medication I could give you.  5) If you fail quitting smoking do not worry.  In most people it can take a few tries and we won't give up.  6) I gave you three pain medicine prescriptions (oxycodone) so you did not have to pay for transportation to pick them up each month.  I will see you back in 6 months, sooner if necessary.

## 2015-08-22 NOTE — Assessment & Plan Note (Signed)
Her blood pressure today was 138/84 which is within target. This is on lisinopril 20 mg by mouth daily and triamterene-hydrochlorothiazide 37.5-25 mg by mouth daily. As she is tolerating these medications well and is within target blood pressure we will continue these anti-hypertensives at the current doses.

## 2015-08-22 NOTE — Assessment & Plan Note (Signed)
She currently is without complaints of claudication at this time. We will work hard on risk factor modification, specifically smoking cessation as noted above. We will reassess for symptoms at the follow-up visit.

## 2015-08-22 NOTE — Assessment & Plan Note (Signed)
She has been taking her Coumadin and is being followed in the anticoagulation clinic. At this point she has no evidence of bleeding on her current dose.  She will continue the Coumadin lifelong.

## 2015-08-22 NOTE — Assessment & Plan Note (Signed)
We discussed some tobacco cessation for more than 3 minutes during this visit. She is very interested in quitting at this time. She has Chantix and was asked to start this medication as prescribed. We are hoping that if we can help with smoking cessation her intermittent pulmonary symptoms requiring an ER visit well resolve. If she would like a another course of Chantix after completing her current bottle she was asked to call the clinic and I would be happy to refill if she was achieving success with this intervention. She was praised for her strong desire to quit smoking and her realization of how important this was to her overall health.

## 2015-08-22 NOTE — Progress Notes (Signed)
   Subjective:    Patient ID: Kristin Knight, female    DOB: 06-08-1946, 69 y.o.   MRN: [id number]  HPI  Kristin Knight is here for follow-up of her depression, smoking, hypertension, and COPD. Please see the A&P for the status of the pt's chronic medical problems.  Kristin Knight has been under significant stress since she was last seen in May. 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 Of note, she signed a lease for her son to have an apartment. He subsequently moved in with his girlfriend and stopped paying rent. This resulted in a $1200 bill for Kristin Knight to cover. In addition, her car has recently acted up and required a new transmission, again costing $1200. In addition to the stress associated with her unexpected financial obligations, her sister died in 2023/06/09. This has resulted in a decrease in her appetite and some weight loss. She also found that she was smoking more. She is currently at a point where things are starting to turn around and she is ready to attempt smoking cessation once again and she realizes how important this is for many aspects of her health.  Review of Systems  Constitutional: Positive for activity change, appetite change and unexpected weight change.       She has been more active lately as she is now working full time cleaning at a company.  She has also been under a lot of stress lately and states this has impacted upon her apitite.  She has had a 15 pound weight loss in the last 5 months.  Respiratory: Negative for cough, chest tightness, shortness of breath and wheezing.   Cardiovascular: Positive for leg swelling. Negative for chest pain and palpitations.       Missed blood pressure medications for 2 days earlier this week with the onset of mild LE edema  Gastrointestinal: Negative for nausea, vomiting, abdominal pain, diarrhea, constipation and abdominal distention.    Musculoskeletal: Positive for back pain. Negative for myalgias.       Well managed by the opoid therapy.  She states she would be unable to work full time without the opoid pain medication.  Psychiatric/Behavioral: Positive for dysphoric mood.      Objective:   Physical Exam  Constitutional: She is oriented to person, place, and time. She appears well-developed and well-nourished. No distress.  HENT:  Head: Normocephalic and atraumatic.  Eyes: Conjunctivae are normal. Right eye exhibits no discharge. Left eye exhibits no discharge. No scleral icterus.  Cardiovascular: Normal rate, regular rhythm and normal heart sounds.  Exam reveals no gallop and no friction rub.   No murmur heard. Pulmonary/Chest: Effort normal and breath sounds normal. No respiratory distress. She has no wheezes. She has no rales.  Abdominal: Soft. Bowel sounds are normal. She exhibits no distension. There is no tenderness. There is no rebound and no guarding.  Musculoskeletal: Normal range of motion. She exhibits edema. She exhibits no tenderness.  Trace bilateral edema to the shins  Neurological: She is alert and oriented to person, place, and time. She exhibits normal muscle tone.  Skin: Skin is warm and dry. She is not diaphoretic.  Psychiatric: She has a normal mood and affect. Her behavior is normal. Judgment and thought content normal.  Nursing note and vitals reviewed.     Assessment & Plan:   Please see problem oriented charting.

## 2015-08-22 NOTE — Assessment & Plan Note (Signed)
She is taking her alendronate 70 mg by mouth once weekly for her osteoporosis. She is tolerating this medication well and we will continue at the current dose. She is not due for a repeat DEXA scan until May 2017.

## 2015-08-22 NOTE — Assessment & Plan Note (Signed)
She currently is without any respiratory complaints. She is taking her albuterol as needed. She is on no other pharmacotherapy for her mild chronic obstructive pulmonary disease. As noted, her tobacco use has increased. She is very interested in quitting and will start using the Chantix. Rather than adding a long-acting muscarinic agent such as tiotropium we both decided to give smoking cessation a trial as this will likely have the greatest impact on overall control of her mild chronic obstructive pulmonary disease. We will reassess her pulmonary symptoms and te success at tobacco cessation at the follow-up visit.

## 2015-08-24 ENCOUNTER — Other Ambulatory Visit: Payer: Self-pay | Admitting: Internal Medicine

## 2015-08-24 DIAGNOSIS — I10 Essential (primary) hypertension: Secondary | ICD-10-CM

## 2015-08-24 DIAGNOSIS — T82868D Thrombosis of vascular prosthetic devices, implants and grafts, subsequent encounter: Secondary | ICD-10-CM

## 2015-10-20 ENCOUNTER — Telehealth (INDEPENDENT_AMBULATORY_CARE_PROVIDER_SITE_OTHER): Payer: Medicare Other | Admitting: Pharmacist

## 2015-10-20 ENCOUNTER — Encounter: Payer: Self-pay | Admitting: Pharmacist

## 2015-10-20 ENCOUNTER — Ambulatory Visit: Payer: Medicare Other

## 2015-10-20 DIAGNOSIS — Z7901 Long term (current) use of anticoagulants: Secondary | ICD-10-CM

## 2015-10-20 DIAGNOSIS — T8386XS Thrombosis of genitourinary prosthetic devices, implants and grafts, sequela: Secondary | ICD-10-CM | POA: Diagnosis not present

## 2015-10-20 DIAGNOSIS — I779 Disorder of arteries and arterioles, unspecified: Secondary | ICD-10-CM

## 2015-10-20 DIAGNOSIS — T82868S Thrombosis of vascular prosthetic devices, implants and grafts, sequela: Secondary | ICD-10-CM

## 2015-10-20 LAB — POCT INR: INR: 1.7

## 2015-10-20 NOTE — Telephone Encounter (Signed)
Reviewed Note

## 2015-10-20 NOTE — Telephone Encounter (Addendum)
Anticoagulation Management Kristin Knight is a 69 y.o. female who was contacted for monitoring of warfarin treatment.    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: indefinite  ASSESSMENT Recent Results: Recent results are below, the most recent result is correlated with a dose of 26 mg per week: Lab Results  Component Value Date   INR 1.7 10/20/2015   INR 2.45* 04/01/2015   INR 2.80 03/31/2015   INR 2.0 02/10/2015   INR today: Subtherapeutic; patient reports 2 missed doses within past week, no signs/symptoms of bleeding or thromboembolism  Anticoagulation Dosing: INR as of 03/31/2015 and Previous Dosing Information    INR Dt INR Goal [employer] Sun Mon Tue Wed Thu Fri Sat   03/31/2015 2.80 2.0-3.0 26 mg 4 mg 4 mg 4 mg 4 mg 2 mg 4 mg 4 mg    Anticoagulation Dose Instructions as of 03/31/2015      Total Sun Mon Tue Wed Thu Fri Sat   New Dose 26 mg 4 mg 4 mg 4 mg 4 mg 2 mg 4 mg 4 mg     (4 mg x 1)  (4 mg x 1)  (4 mg x 1)  (4 mg x 1)  (4 mg x 0.5)  (4 mg x 1)  (4 mg x 1)                           PLAN Weekly dose was unchanged since we were able to attribute INR result to missed doses. Importance of adherence reinforced, patient verbalized understanding.  Follow up: 2-3 weeks

## 2015-10-22 ENCOUNTER — Telehealth: Payer: Self-pay | Admitting: Internal Medicine

## 2015-10-22 DIAGNOSIS — M545 Low back pain, unspecified: Secondary | ICD-10-CM

## 2015-10-22 DIAGNOSIS — G8929 Other chronic pain: Secondary | ICD-10-CM

## 2015-10-22 MED ORDER — OXYCODONE-ACETAMINOPHEN 7.5-325 MG PO TABS: 1.0000 | ORAL_TABLET | Freq: Three times a day (TID) | ORAL | Status: DC | PRN

## 2015-10-22 NOTE — Telephone Encounter (Signed)
Please call pt back regarding percocet.

## 2015-10-22 NOTE — Telephone Encounter (Signed)
Called pt back, she states narcotic scripts dated wrong, ran database will forward to pcp

## 2015-11-03 ENCOUNTER — Ambulatory Visit (INDEPENDENT_AMBULATORY_CARE_PROVIDER_SITE_OTHER): Payer: Medicare Other | Admitting: Pharmacist

## 2015-11-03 DIAGNOSIS — I779 Disorder of arteries and arterioles, unspecified: Secondary | ICD-10-CM | POA: Diagnosis not present

## 2015-11-03 DIAGNOSIS — Z7901 Long term (current) use of anticoagulants: Secondary | ICD-10-CM

## 2015-11-03 DIAGNOSIS — T82868D Thrombosis of vascular prosthetic devices, implants and grafts, subsequent encounter: Secondary | ICD-10-CM

## 2015-11-03 LAB — POCT INR: INR: 2

## 2015-11-03 MED ORDER — WARFARIN SODIUM 4 MG PO TABS
ORAL_TABLET | ORAL | Status: DC
Start: 2015-11-03 — End: 2015-12-08

## 2015-11-03 NOTE — Progress Notes (Signed)
Anti-Coagulation Progress Note  Kristin Knight is a 69 y.o. female who is currently on an anti-coagulation regimen.    RECENT RESULTS: Recent results are below, the most recent result is correlated with a dose of 26 mg. per week: Lab Results  Component Value Date   INR 2.00 11/03/2015   INR 1.7 10/20/2015   INR 2.45* 04/01/2015    ANTI-COAG DOSE: Anticoagulation Dose Instructions as of 11/03/2015      [name] Tue Wed Thu Fri Sat   New Dose 4 mg 6 mg 4 mg 4 mg 4 mg 4 mg 4 mg       ANTICOAG SUMMARY: Anticoagulation Episode Summary    Current INR goal 2.0-3.0  Next INR check 11/24/2015  INR from last check 2.00 (11/03/2015)  Weekly max dose   Target end date   [facility]  Preferred lab   Send INR reminders to    Indications  Long term current use of anticoagulant therapy [Z79.01] Peripheral arterial occlusive disease ([place]) [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[place]) [id number]        Comments         ANTICOAG TODAY: Anticoagulation Summary as of 11/03/2015    INR goal 2.0-3.0  Selected INR 2.00 (11/03/2015)  Next INR check 11/24/2015  Target end date    Indications  Long term current use of anticoagulant therapy [Z79.01] Peripheral arterial occlusive disease ([place]) [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[place]) [id number]      Anticoagulation Episode Summary    [facility]   Preferred lab    Send INR reminders to    Comments       PATIENT INSTRUCTIONS: Patient Instructions  Patient instructed to take medications as defined in the Anti-coagulation Track section of this encounter.  Patient instructed to take today's dose.  Patient verbalized understanding of these instructions.       FOLLOW-UP Return in 3 weeks (on 11/24/2015) for Follow up INR at 1115h.  Jorene Guest, III Pharm.D., CACP

## 2015-11-03 NOTE — Patient Instructions (Signed)
Patient instructed to take medications as defined in the Anti-coagulation Track section of this encounter.  Patient instructed to take today's dose.  Patient verbalized understanding of these instructions.    

## 2015-11-06 NOTE — Progress Notes (Signed)
I have reviewed Dr. Gladstone Pih note.  Patient is on [facility] for graft thrombosis.  INR low end of normal.  Coumadin increased.

## 2015-12-08 ENCOUNTER — Ambulatory Visit (INDEPENDENT_AMBULATORY_CARE_PROVIDER_SITE_OTHER): Payer: Medicare Other | Admitting: Pharmacist

## 2015-12-08 DIAGNOSIS — Z7901 Long term (current) use of anticoagulants: Secondary | ICD-10-CM

## 2015-12-08 DIAGNOSIS — T82868D Thrombosis of vascular prosthetic devices, implants and grafts, subsequent encounter: Secondary | ICD-10-CM

## 2015-12-08 DIAGNOSIS — I779 Disorder of arteries and arterioles, unspecified: Secondary | ICD-10-CM

## 2015-12-08 DIAGNOSIS — Y832 Surgical operation with anastomosis, bypass or graft as the cause of abnormal reaction of the patient, or of later complication, without mention of misadventure at the time of the procedure: Secondary | ICD-10-CM

## 2015-12-08 LAB — POCT INR: INR: 3.3

## 2015-12-08 MED ORDER — WARFARIN SODIUM 4 MG PO TABS: ORAL_TABLET | ORAL | Status: DC

## 2015-12-08 NOTE — Progress Notes (Signed)
Anticoagulation Management Kristin Knight is a 70 y.o. female who reports to the clinic for monitoring of warfarin treatment.    Indication: Long term current use of anticoagulant therapy [Z79.01], Peripheral arterial occlusive disease ([place]) [I77.9], 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[place]) [id number] Duration: indefinite  Anticoagulation Clinic Visit History: Patient does not report signs/symptoms of bleeding or thromboembolism Recent changes: none reported Anticoagulation Episode Summary    Current INR goal 2.0-3.0  Next INR check 12/29/2015  INR from last check 3.3! (12/08/2015)  Weekly max dose   Target end date   [facility]  Preferred lab   Send INR reminders to    Indications  Long term current use of anticoagulant therapy [Z79.01] Peripheral arterial occlusive disease ([place]) [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[place]) [id number]        Comments        ASSESSMENT Recent Results: Recent results are below, the most recent result is correlated with a dose of 30 mg per week: Lab Results  Component Value Date   INR 3.3 12/08/2015   INR 2.00 11/03/2015   INR 1.7 10/20/2015   INR today: Supratherapeutic  Anticoagulation Dosing: INR as of 12/08/2015 and Previous Dosing Information    INR Dt INR Goal [employer] Sun Mon Tue Wed Thu Fri Sat   12/08/2015 3.3 2.0-3.0 30 mg 4 mg 6 mg 4 mg 4 mg 4 mg 4 mg 4 mg    Anticoagulation Dose Instructions as of 12/08/2015      Total Sun Mon Tue Wed Thu Fri Sat   New Dose 28 mg 4 mg 4 mg 4 mg 4 mg 4 mg 4 mg 4 mg     (4 mg x 1)  (4 mg x 1)  (4 mg x 1)  (4 mg x 1)  (4 mg x 1)  (4 mg x 1)  (4 mg x 1)                           PLAN Weekly dose was decreased by 7% to 28 mg per week  Patient Instructions  Patient educated about medication as defined in this encounter and verbalized understanding by repeating back instructions provided.   Patient advised to contact clinic or seek medical attention if signs/symptoms of  bleeding or thromboembolism occur.  Follow-up Return in about 3 weeks (around 12/29/2015) for Follow up INR 12/29/15 at 3pm.  [name]

## 2015-12-08 NOTE — Patient Instructions (Signed)
Patient educated about medication as defined in this encounter and verbalized understanding by repeating back instructions provided.   

## 2015-12-09 NOTE — Progress Notes (Signed)
Indication: s/p recurrent graft thrombosis. Duration: Lifelong. INR: Above target. Agree with Dr. Julianne Rice assessment and plan.

## 2015-12-29 ENCOUNTER — Ambulatory Visit (INDEPENDENT_AMBULATORY_CARE_PROVIDER_SITE_OTHER): Payer: Medicare Other | Admitting: Pharmacist

## 2015-12-29 DIAGNOSIS — T82868S Thrombosis of vascular prosthetic devices, implants and grafts, sequela: Secondary | ICD-10-CM

## 2015-12-29 DIAGNOSIS — T82868D Thrombosis of vascular prosthetic devices, implants and grafts, subsequent encounter: Secondary | ICD-10-CM

## 2015-12-29 DIAGNOSIS — I779 Disorder of arteries and arterioles, unspecified: Secondary | ICD-10-CM

## 2015-12-29 DIAGNOSIS — Z7901 Long term (current) use of anticoagulants: Secondary | ICD-10-CM | POA: Diagnosis not present

## 2015-12-29 LAB — POCT INR: INR: 1.4

## 2015-12-29 MED ORDER — WARFARIN SODIUM 4 MG PO TABS: ORAL_TABLET | ORAL | Status: DC

## 2015-12-29 NOTE — Progress Notes (Signed)
Anti-Coagulation Progress Note  Kristin Knight is a 70 y.o. female who is currently on an anti-coagulation regimen.    RECENT RESULTS: Recent results are below, the most recent result is correlated with a dose of 28 mg. per week: Lab Results  Component Value Date   INR 1.40 12/29/2015   INR 3.3 12/08/2015   INR 2.00 11/03/2015    ANTI-COAG DOSE: Anticoagulation Dose Instructions as of 12/29/2015      [name] Tue Wed Thu Fri Sat   New Dose 4 mg 6 mg 4 mg 4 mg 4 mg 4 mg 4 mg       ANTICOAG SUMMARY: Anticoagulation Episode Summary    Current INR goal 2.0-3.0  Next INR check 01/19/2016  INR from last check 1.40! (12/29/2015)  Weekly max dose   Target end date   [facility]  Preferred lab   Send INR reminders to    Indications  Long term current use of anticoagulant therapy [Z79.01] Peripheral arterial occlusive disease ([place]) [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[place]) [id number]        Comments         ANTICOAG TODAY: Anticoagulation Summary as of 12/29/2015    INR goal 2.0-3.0  Selected INR 1.40! (12/29/2015)  Next INR check 01/19/2016  Target end date    Indications  Long term current use of anticoagulant therapy [Z79.01] Peripheral arterial occlusive disease ([place]) [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[place]) [id number]      Anticoagulation Episode Summary    [facility]   Preferred lab    Send INR reminders to    Comments       PATIENT INSTRUCTIONS: Patient Instructions  Patient instructed to take medications as defined in the Anti-coagulation Track section of this encounter.  Patient instructed to take today's dose.  Patient verbalized understanding of these instructions.       FOLLOW-UP Return in 3 weeks (on 01/19/2016) for Follow up INR at [place], III Pharm.D., CACP

## 2015-12-29 NOTE — Patient Instructions (Signed)
Patient instructed to take medications as defined in the Anti-coagulation Track section of this encounter.  Patient instructed to take today's dose.  Patient verbalized understanding of these instructions.    

## 2015-12-30 NOTE — Progress Notes (Signed)
INTERNAL MEDICINE TEACHING ATTENDING ADDENDUM - Lalla Brothers M.D  Duration- indefinite, Indication- venous graft thrombus, INR- subtherapeutic. Agree with pharmacy recommendations as outlined in their note.

## 2016-01-19 ENCOUNTER — Ambulatory Visit (INDEPENDENT_AMBULATORY_CARE_PROVIDER_SITE_OTHER): Payer: Medicare Other | Admitting: Pharmacist

## 2016-01-19 DIAGNOSIS — T82868D Thrombosis of vascular prosthetic devices, implants and grafts, subsequent encounter: Secondary | ICD-10-CM

## 2016-01-19 DIAGNOSIS — Z7901 Long term (current) use of anticoagulants: Secondary | ICD-10-CM | POA: Diagnosis not present

## 2016-01-19 DIAGNOSIS — I779 Disorder of arteries and arterioles, unspecified: Secondary | ICD-10-CM

## 2016-01-19 LAB — POCT INR: INR: 2.2

## 2016-01-19 NOTE — Progress Notes (Signed)
Anti-Coagulation Progress Note  Debby Meddock is a 70 y.o. female who is currently on an anti-coagulation regimen.    RECENT RESULTS: Recent results are below, the most recent result is correlated with a dose of 30 mg. per week: Lab Results  Component Value Date   INR 2.20 01/19/2016   INR 1.40 12/29/2015   INR 3.3 12/08/2015    ANTI-COAG DOSE: Anticoagulation Dose Instructions as of 01/19/2016      [name] Tue Wed Thu Fri Sat   New Dose 4 mg 6 mg 4 mg 4 mg 4 mg 4 mg 4 mg       ANTICOAG SUMMARY: Anticoagulation Episode Summary    Current INR goal 2.0-3.0  Next INR check 02/09/2016  INR from last check 2.20 (01/19/2016)  Weekly max dose   Target end date   [facility]  Preferred lab   Send INR reminders to    Indications  Long term current use of anticoagulant therapy [Z79.01] Peripheral arterial occlusive disease ([place]) [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[place]) [id number]        Comments         ANTICOAG TODAY: Anticoagulation Summary as of 01/19/2016    INR goal 2.0-3.0  Selected INR 2.20 (01/19/2016)  Next INR check 02/09/2016  Target end date    Indications  Long term current use of anticoagulant therapy [Z79.01] Peripheral arterial occlusive disease ([place]) [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[place]) [id number]      Anticoagulation Episode Summary    [facility]   Preferred lab    Send INR reminders to    Comments       PATIENT INSTRUCTIONS: Patient Instructions  Patient instructed to take medications as defined in the Anti-coagulation Track section of this encounter.  Patient instructed to take today's dose.  Patient verbalized understanding of these instructions.       FOLLOW-UP Return in 3 weeks (on 02/09/2016) for Follow up INR at 2:45PM.  Jorene Guest, III Pharm.D., CACP

## 2016-01-19 NOTE — Patient Instructions (Signed)
Patient instructed to take medications as defined in the Anti-coagulation Track section of this encounter.  Patient instructed to take today's dose.  Patient verbalized understanding of these instructions.    

## 2016-01-20 NOTE — Progress Notes (Signed)
INTERNAL MEDICINE TEACHING ATTENDING ADDENDUM - Lalla Brothers M.D  Duration- indefinite, Indication- vascular graft thrombosis, INR- therapeutic. Agree with pharmacy recommendations as outlined in their note.

## 2016-02-03 ENCOUNTER — Other Ambulatory Visit: Payer: Self-pay | Admitting: Internal Medicine

## 2016-02-03 DIAGNOSIS — M545 Low back pain, unspecified: Secondary | ICD-10-CM

## 2016-02-03 DIAGNOSIS — G8929 Other chronic pain: Secondary | ICD-10-CM

## 2016-02-03 NOTE — Telephone Encounter (Signed)
Last refill done on 2/6, written 12/7 per pharmacy Next appt 4/28 w/ dr Eppie Gibson

## 2016-02-03 NOTE — Telephone Encounter (Signed)
NEEDS REFILL ON PAIN MEDICATION °

## 2016-02-05 MED ORDER — OXYCODONE-ACETAMINOPHEN 7.5-325 MG PO TABS: 1.0000 | ORAL_TABLET | Freq: Three times a day (TID) | ORAL | Status: DC | PRN

## 2016-02-05 NOTE — Telephone Encounter (Signed)
3/3 given to patient today

## 2016-02-09 ENCOUNTER — Ambulatory Visit (INDEPENDENT_AMBULATORY_CARE_PROVIDER_SITE_OTHER): Payer: Medicare Other | Admitting: Pharmacist

## 2016-02-09 DIAGNOSIS — I779 Disorder of arteries and arterioles, unspecified: Secondary | ICD-10-CM | POA: Diagnosis not present

## 2016-02-09 DIAGNOSIS — T82868D Thrombosis of vascular prosthetic devices, implants and grafts, subsequent encounter: Secondary | ICD-10-CM | POA: Diagnosis not present

## 2016-02-09 DIAGNOSIS — Y839 Surgical procedure, unspecified as the cause of abnormal reaction of the patient, or of later complication, without mention of misadventure at the time of the procedure: Secondary | ICD-10-CM

## 2016-02-09 DIAGNOSIS — Z7901 Long term (current) use of anticoagulants: Secondary | ICD-10-CM

## 2016-02-09 LAB — POCT INR: INR: 3.1

## 2016-02-09 NOTE — Patient Instructions (Signed)
Patient instructed to take medications as defined in the Anti-coagulation Track section of this encounter.  Patient instructed to take today's dose.  Patient verbalized understanding of these instructions.    

## 2016-02-09 NOTE — Progress Notes (Signed)
Anti-Coagulation Progress Note  Kristin Knight is a 70 y.o. female who is currently on an anti-coagulation regimen.    RECENT RESULTS: Recent results are below, the most recent result is correlated with a dose of 32 mg. per week: Lab Results  Component Value Date   INR 3.10 02/09/2016   INR 2.20 01/19/2016   INR 1.40 12/29/2015    ANTI-COAG DOSE: Anticoagulation Dose Instructions as of 02/09/2016      [name] Tue Wed Thu Fri Sat   New Dose 4 mg 4 mg 4 mg 6 mg 4 mg 4 mg 4 mg       ANTICOAG SUMMARY: Anticoagulation Episode Summary    Current INR goal 2.0-3.0  Next INR check 03/01/2016  INR from last check 3.10! (02/09/2016)  Weekly max dose   Target end date   [facility]  Preferred lab   Send INR reminders to    Indications  Long term current use of anticoagulant therapy [Z79.01] Peripheral arterial occlusive disease ([place]) [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[place]) [id number]        Comments         ANTICOAG TODAY: Anticoagulation Summary as of 02/09/2016    INR goal 2.0-3.0  Selected INR 3.10! (02/09/2016)  Next INR check 03/01/2016  Target end date    Indications  Long term current use of anticoagulant therapy [Z79.01] Peripheral arterial occlusive disease ([place]) [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[place]) [id number]      Anticoagulation Episode Summary    IN[facility] check [facility]   Preferred lab    Send INR reminders to    Comments       PATIENT INSTRUCTIONS: Patient Instructions  Patient instructed to take medications as defined in the Anti-coagulation Track section of this encounter.  Patient instructed to take today's dose.  Patient verbalized understanding of these instructions.       FOLLOW-UP Return in 3 weeks (on 03/01/2016) for Follow up INR at [place], III Pharm.D., CACP

## 2016-02-11 NOTE — Progress Notes (Signed)
I have reviewed Dr. Gladstone Pih note.  Patient is on [facility] for vascular graft thrombosis.  INR elevated, coumadin decreased.

## 2016-02-25 ENCOUNTER — Other Ambulatory Visit: Payer: Self-pay

## 2016-02-25 DIAGNOSIS — Z1231 Encounter for screening mammogram for malignant neoplasm of breast: Secondary | ICD-10-CM

## 2016-03-01 ENCOUNTER — Ambulatory Visit (INDEPENDENT_AMBULATORY_CARE_PROVIDER_SITE_OTHER): Payer: Medicare Other | Admitting: Pharmacist

## 2016-03-01 DIAGNOSIS — Z7901 Long term (current) use of anticoagulants: Secondary | ICD-10-CM | POA: Diagnosis not present

## 2016-03-01 DIAGNOSIS — I739 Peripheral vascular disease, unspecified: Secondary | ICD-10-CM | POA: Diagnosis not present

## 2016-03-01 DIAGNOSIS — T82868D Thrombosis of vascular prosthetic devices, implants and grafts, subsequent encounter: Secondary | ICD-10-CM | POA: Diagnosis not present

## 2016-03-01 DIAGNOSIS — I779 Disorder of arteries and arterioles, unspecified: Secondary | ICD-10-CM

## 2016-03-01 DIAGNOSIS — Y832 Surgical operation with anastomosis, bypass or graft as the cause of abnormal reaction of the patient, or of later complication, without mention of misadventure at the time of the procedure: Secondary | ICD-10-CM

## 2016-03-01 LAB — POCT INR: INR: 2.4

## 2016-03-01 NOTE — Progress Notes (Signed)
Anti-Coagulation Progress Note  Kristin Knight is a 70 y.o. female who is currently on an anti-coagulation regimen.    RECENT RESULTS: Recent results are below, the most recent result is correlated with a dose of 30 mg. per week: Lab Results  Component Value Date   INR 2.40 03/01/2016   INR 3.10 02/09/2016   INR 2.20 01/19/2016    ANTI-COAG DOSE: Anticoagulation Dose Instructions as of 03/01/2016      [name] Tue Wed Thu Fri Sat   New Dose 4 mg 4 mg 4 mg 6 mg 4 mg 4 mg 4 mg       ANTICOAG SUMMARY: Anticoagulation Episode Summary    Current INR goal 2.0-3.0  Next INR check 03/29/2016  INR from last check 2.40 (03/01/2016)  Weekly max dose   Target end date   [facility] check [facility]  Preferred lab   Send INR reminders to    Indications  Long term current use of anticoagulant therapy [Z79.01] Peripheral arterial occlusive disease ([place]) [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[place]) [id number]        Comments         ANTICOAG TODAY: Anticoagulation Summary as of 03/01/2016    INR goal 2.0-3.0  Selected INR 2.40 (03/01/2016)  Next INR check 03/29/2016  Target end date    Indications  Long term current use of anticoagulant therapy [Z79.01] Peripheral arterial occlusive disease ([place]) [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[place]) [id number]      Anticoagulation Episode Summary    [facility] check [facility]   Preferred lab    Send INR reminders to    Comments       PATIENT INSTRUCTIONS: Patient Instructions  Patient instructed to take medications as defined in the Anti-coagulation Track section of this encounter.  Patient instructed to take today's dose.  Patient verbalized understanding of these instructions.       FOLLOW-UP Return in about 4 weeks (around 03/29/2016) for Follow up INR at [place], III Pharm.D., CACP

## 2016-03-01 NOTE — Patient Instructions (Signed)
Patient instructed to take medications as defined in the Anti-coagulation Track section of this encounter.  Patient instructed to take today's dose.  Patient verbalized understanding of these instructions.    

## 2016-03-02 NOTE — Progress Notes (Signed)
Indication: Vascular graft thrombosis. Duration: Indefinite. INR: At target. Agree with Dr. Gladstone Pih assessment and plan.

## 2016-03-11 ENCOUNTER — Telehealth: Payer: Self-pay | Admitting: Internal Medicine

## 2016-03-11 NOTE — Telephone Encounter (Signed)
APPT. REMINDER CALL, LMTCB °

## 2016-03-12 ENCOUNTER — Ambulatory Visit (INDEPENDENT_AMBULATORY_CARE_PROVIDER_SITE_OTHER): Payer: Medicare Other | Admitting: Internal Medicine

## 2016-03-12 ENCOUNTER — Encounter: Payer: Self-pay | Admitting: Internal Medicine

## 2016-03-12 VITALS — BP 124/78 | HR 76 | Temp 98.7°F | Wt 117.1 lb

## 2016-03-12 DIAGNOSIS — I739 Peripheral vascular disease, unspecified: Secondary | ICD-10-CM

## 2016-03-12 DIAGNOSIS — J449 Chronic obstructive pulmonary disease, unspecified: Secondary | ICD-10-CM

## 2016-03-12 DIAGNOSIS — I779 Disorder of arteries and arterioles, unspecified: Secondary | ICD-10-CM

## 2016-03-12 DIAGNOSIS — I10 Essential (primary) hypertension: Secondary | ICD-10-CM

## 2016-03-12 DIAGNOSIS — Z7901 Long term (current) use of anticoagulants: Secondary | ICD-10-CM | POA: Diagnosis not present

## 2016-03-12 DIAGNOSIS — Z720 Tobacco use: Secondary | ICD-10-CM

## 2016-03-12 DIAGNOSIS — F17200 Nicotine dependence, unspecified, uncomplicated: Secondary | ICD-10-CM

## 2016-03-12 DIAGNOSIS — T82868D Thrombosis of vascular prosthetic devices, implants and grafts, subsequent encounter: Secondary | ICD-10-CM

## 2016-03-12 DIAGNOSIS — M545 Low back pain, unspecified: Secondary | ICD-10-CM

## 2016-03-12 DIAGNOSIS — F331 Major depressive disorder, recurrent, moderate: Secondary | ICD-10-CM

## 2016-03-12 DIAGNOSIS — Z86718 Personal history of other venous thrombosis and embolism: Secondary | ICD-10-CM

## 2016-03-12 DIAGNOSIS — G8929 Other chronic pain: Secondary | ICD-10-CM

## 2016-03-12 MED ORDER — BUPROPION HCL ER (SR) 150 MG PO TB12: 150.0000 mg | ORAL_TABLET | Freq: Two times a day (BID) | ORAL | Status: DC

## 2016-03-12 MED ORDER — LOVASTATIN 20 MG PO TABS: 20.0000 mg | ORAL_TABLET | Freq: Every day | ORAL | Status: DC

## 2016-03-12 NOTE — Assessment & Plan Note (Signed)
Assessment  Her chronic low back pain is symptomatically controlled on the Percocet 7.5-325 mg 1 tablet every 8 hours as needed. 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 Without this medication, she feels she would be unable to get through a complete shift.  Plan  We will continue the Percocet 7.5-325 mg by mouth daily as needed for pain and reassess the control of her pain at the follow-up visit. We will also reassess whether this medication is able to maintain her function.

## 2016-03-12 NOTE — Progress Notes (Signed)
   Subjective:    Patient ID: Kristin Knight, female    DOB: Jun 02, 1946, 70 y.o.   MRN: [id number]  HPI  Kristin Knight is here for follow-up of her major depression, tobacco abuse, and essential hypertension. Please see the A&P for the status of the pt's chronic medical problems.  Review of Systems  Constitutional: Negative for activity change, appetite change and unexpected weight change.  Respiratory: Negative for cough, chest tightness, shortness of breath and wheezing.   Cardiovascular: Negative for chest pain, palpitations and leg swelling.  Gastrointestinal: Positive for nausea and vomiting. Negative for diarrhea and constipation.       Nausea and vomiting with Chantix.  These have resolved since stopping the Chantix  Musculoskeletal: Positive for back pain. Negative for joint swelling and gait problem.  Skin: Negative for rash and wound.  Neurological: Negative for dizziness, syncope, weakness and light-headedness.  Psychiatric/Behavioral: Positive for dysphoric mood.      Objective:   Physical Exam  Constitutional: She is oriented to person, place, and time. She appears well-developed and well-nourished. No distress.  HENT:  Head: Normocephalic and atraumatic.  Eyes: Conjunctivae are normal. Right eye exhibits no discharge. Left eye exhibits no discharge. No scleral icterus.  Cardiovascular: Normal rate, regular rhythm and normal heart sounds.  Exam reveals no gallop and no friction rub.   No murmur heard. Pulmonary/Chest: Effort normal and breath sounds normal. No respiratory distress. She has no wheezes. She has no rales.  Musculoskeletal: Normal range of motion. She exhibits no edema or tenderness.  Neurological: She is alert and oriented to person, place, and time. She exhibits normal muscle tone.  Skin: Skin is warm and dry. No rash noted. She is not diaphoretic. No erythema.  Psychiatric: She has a normal mood and affect. Her behavior is normal. Judgment and thought  content normal.  Nursing note and vitals reviewed.     Assessment & Plan:   Please see problem oriented charting.

## 2016-03-12 NOTE — Assessment & Plan Note (Signed)
Assessment  She tried the Chantix but developed nausea and vomiting with it. Therefore she was unable to complete this course. She is continuing to smoke but is mentally ready and wishes help with quitting.  Plan  We decided to start bupropion 150 mg by mouth twice daily for 7 weeks. For the first 4 days she is to take 150 mg by mouth daily. We will reassess her success at smoking cessation at the follow-up visit.

## 2016-03-12 NOTE — Assessment & Plan Note (Signed)
Assessment  Her blood pressure was well controlled today at 124/78. This is on lisinopril 20 mg by mouth daily and triamterene-hydrochlorothiazide 37.5-25 mg 1 tablet by mouth daily. She is tolerating this regimen well.  Plan  We will continue the lisinopril at 20 mg by mouth daily and triamterene-hydrochlorothiazide 37.5-25 mg 1 tablet by mouth daily. We will reassess her blood pressure control at the follow-up visit.

## 2016-03-12 NOTE — Assessment & Plan Note (Signed)
Assessment  Her mild chronic obstructive pulmonary disease has been relatively asymptomatic over the last several months. She states she's only had to use her as needed albuterol on a very rare occasion.  Plan  We will continue with the as needed albuterol for her mild chronic obstructive pulmonary disease and reassess symptom control at the follow-up visit.

## 2016-03-12 NOTE — Assessment & Plan Note (Signed)
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  Plan  We will continue the warfarin as managed in the anticoagulation clinic. We will reassess her tolerance of this medication and look for any side effects at the follow-up visit.

## 2016-03-12 NOTE — Assessment & Plan Note (Signed)
Assessment  She states that her stressors financially have improved somewhat since the last clinic visit. She is now living with her daughter and sharing expenses. This helps relieve some of the financial pressure she has been experiencing. Unfortunately, today in the parking lot at the hospital, her transmission would not shift out of drive. This has happened before and apparently is an easy fix although she's had to call a mechanic to get this done. From a depression control standpoint she states that she stopped the mirtazapine for several days and noted a worsening in her appetite. She restarted the medication and her appetite improved. She had only stopped it for 4 days and cannot attest to a decrease in her mood.  Plan  She wishes to continue the mirtazapine at 15 mg by mouth every night. She feels it's still necessary. We will therefore continue and reassess her symptoms of major depression at the follow-up visit.

## 2016-03-12 NOTE — Patient Instructions (Signed)
It was great to see you again as always!  I am sorry to hear about your car trouble.  I am hopeful it is an easy fix.  1) Keep taking the medications as you are.  AS we talked about stop the Chantix and the pravastatin.  2) For the smoking I start bupropion 150 mg twice daily for 7 weeks.  For the first 4 days just take 150 mg once daily before going to the twice daily dose.  I am proud of your efforts to quit smoking.  3) I replaced the pravastatin with lovastatin 20 mg daily.  This should be cheaper at [facility] but work the same as the pravastatin.  4) We can talk about blood work and a bone strength test at the next appointment.  I will see you back in 6 months, sooner if necessary.

## 2016-03-12 NOTE — Assessment & Plan Note (Signed)
Assessment  She has no signs or symptoms related to her peripheral arterial occlusive disease, specifically claudication.  Plan  We will continue aggressive risk factor modification with control of her blood pressure, hyperlipidemia, and tobacco abuse. We will reassess for symptoms of peripheral vascular occlusive disease at the follow-up visit.

## 2016-03-12 NOTE — Assessment & Plan Note (Signed)
Finances remain problematic and we will defer the Zostavax as well as the hepatitis C screen until the follow-up visit.

## 2016-03-15 ENCOUNTER — Ambulatory Visit
Admission: RE | Admit: 2016-03-15 | Discharge: 2016-03-15 | Disposition: A | Discharge status: Home / Self Care | Payer: Medicare Other | Source: Ambulatory Visit

## 2016-03-15 DIAGNOSIS — Z1231 Encounter for screening mammogram for malignant neoplasm of breast: Secondary | ICD-10-CM | POA: Diagnosis not present

## 2016-03-22 ENCOUNTER — Other Ambulatory Visit: Payer: Self-pay | Admitting: Internal Medicine

## 2016-03-22 DIAGNOSIS — F331 Major depressive disorder, recurrent, moderate: Secondary | ICD-10-CM

## 2016-03-29 ENCOUNTER — Ambulatory Visit (INDEPENDENT_AMBULATORY_CARE_PROVIDER_SITE_OTHER): Payer: Medicare Other | Admitting: Pharmacist

## 2016-03-29 DIAGNOSIS — T82868D Thrombosis of vascular prosthetic devices, implants and grafts, subsequent encounter: Secondary | ICD-10-CM | POA: Diagnosis not present

## 2016-03-29 DIAGNOSIS — I779 Disorder of arteries and arterioles, unspecified: Secondary | ICD-10-CM | POA: Diagnosis not present

## 2016-03-29 DIAGNOSIS — Z7901 Long term (current) use of anticoagulants: Secondary | ICD-10-CM

## 2016-03-29 DIAGNOSIS — Y832 Surgical operation with anastomosis, bypass or graft as the cause of abnormal reaction of the patient, or of later complication, without mention of misadventure at the time of the procedure: Secondary | ICD-10-CM

## 2016-03-29 LAB — POCT INR: INR: 1.3

## 2016-03-29 MED ORDER — WARFARIN SODIUM 4 MG PO TABS: ORAL_TABLET | ORAL | Status: DC

## 2016-03-29 NOTE — Progress Notes (Signed)
Indication: Vascular graft thrombosis Duration: Indefinite INR: Below target.  Dr. Gladstone Pih assessment and plan were reviewed and I agree with his documentation.

## 2016-03-29 NOTE — Progress Notes (Signed)
Anti-Coagulation Progress Note  Kristin Knight is a 70 y.o. female who is currently on an anti-coagulation regimen.    RECENT RESULTS: Recent results are below, the most recent result is correlated with a dose of 30 mg. per week: Lab Results  Component Value Date   INR 1.30 03/29/2016   INR 2.40 03/01/2016   INR 3.10 02/09/2016    ANTI-COAG DOSE: Anticoagulation Dose Instructions as of 03/29/2016      [name] Tue Wed Thu Fri Sat   New Dose 4 mg 6 mg 4 mg 4 mg 6 mg 4 mg 4 mg       ANTICOAG SUMMARY: Anticoagulation Episode Summary    Current INR goal 2.0-3.0  Next INR check 04/05/2016  INR from last check 1.30! 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  Patient instructed to take today's dose.  Patient verbalized understanding of these instructions.       FOLLOW-UP Return in 7 days (on 04/05/2016) for Follow up INR at 2:45PM.  Jorene Guest, III Pharm.D., CACP

## 2016-03-29 NOTE — Patient Instructions (Signed)
Patient instructed to take medications as defined in the Anti-coagulation Track section of this encounter.  Patient instructed to take today's dose.  Patient verbalized understanding of these instructions.    

## 2016-04-05 ENCOUNTER — Ambulatory Visit (INDEPENDENT_AMBULATORY_CARE_PROVIDER_SITE_OTHER): Payer: Medicare Other | Admitting: Pharmacist

## 2016-04-05 DIAGNOSIS — Y832 Surgical operation with anastomosis, bypass or graft as the cause of abnormal reaction of the patient, or of later complication, without mention of misadventure at the time of the procedure: Secondary | ICD-10-CM

## 2016-04-05 DIAGNOSIS — Z7901 Long term (current) use of anticoagulants: Secondary | ICD-10-CM

## 2016-04-05 DIAGNOSIS — I779 Disorder of arteries and arterioles, unspecified: Secondary | ICD-10-CM

## 2016-04-05 DIAGNOSIS — T82868D Thrombosis of vascular prosthetic devices, implants and grafts, subsequent encounter: Secondary | ICD-10-CM | POA: Diagnosis not present

## 2016-04-05 LAB — POCT INR: INR: 1.9

## 2016-04-05 NOTE — Progress Notes (Signed)
Anti-Coagulation Progress Note  Kristin Knight is a 70 y.o. female who is currently on an anti-coagulation regimen.    RECENT RESULTS: Recent results are below, the most recent result is correlated with a dose of 32 mg. per week: Lab Results  Component Value Date   INR 1.90 04/05/2016   INR 1.30 03/29/2016   INR 2.40 03/01/2016    ANTI-COAG DOSE: Anticoagulation Dose Instructions as of 04/05/2016      [name] Tue Wed Thu Fri Sat   New Dose 4 mg 6 mg 4 mg 6 mg 4 mg 6 mg 4 mg       ANTICOAG SUMMARY: Anticoagulation Episode Summary    Current INR goal 2.0-3.0  Next INR check 04/19/2016  INR from last check 1.90! 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  Patient instructed to take today's dose.  Patient verbalized understanding of these instructions.       FOLLOW-UP Return in 2 weeks (on 04/19/2016) for Follow up INR at 2:45PM.  Jorene Guest, III Pharm.D., CACP

## 2016-04-05 NOTE — Patient Instructions (Signed)
Patient instructed to take medications as defined in the Anti-coagulation Track section of this encounter.  Patient instructed to take today's dose.  Patient verbalized understanding of these instructions.    

## 2016-04-06 NOTE — Progress Notes (Signed)
INTERNAL MEDICINE TEACHING ATTENDING ADDENDUM - Lalla Brothers M.D  Duration- indefinite, Indication- vascular graft thrombosis, INR- subtherapeutic. Agree with pharmacy recommendations as outlined in their note.

## 2016-04-19 ENCOUNTER — Ambulatory Visit: Payer: Self-pay

## 2016-04-26 ENCOUNTER — Ambulatory Visit (INDEPENDENT_AMBULATORY_CARE_PROVIDER_SITE_OTHER): Payer: Medicare Other | Admitting: Pharmacist

## 2016-04-26 DIAGNOSIS — I779 Disorder of arteries and arterioles, unspecified: Secondary | ICD-10-CM

## 2016-04-26 DIAGNOSIS — T82868D Thrombosis of vascular prosthetic devices, implants and grafts, subsequent encounter: Secondary | ICD-10-CM

## 2016-04-26 DIAGNOSIS — Z7901 Long term (current) use of anticoagulants: Secondary | ICD-10-CM

## 2016-04-26 LAB — POCT INR: INR: 2

## 2016-04-26 MED ORDER — WARFARIN SODIUM 4 MG PO TABS: ORAL_TABLET | ORAL | Status: DC

## 2016-04-26 NOTE — Patient Instructions (Signed)
Patient instructed to take medications as defined in the Anti-coagulation Track section of this encounter.  Patient instructed to take today's dose.  Patient verbalized understanding of these instructions.    

## 2016-04-26 NOTE — Progress Notes (Signed)
Anti-Coagulation Progress Note  Kristin Knight is a 70 y.o. female who is currently on an anti-coagulation regimen.    RECENT RESULTS: Recent results are below, the most recent result is correlated with a dose of 34 mg. per week: Lab Results  Component Value Date   INR 2.00 04/26/2016   INR 1.90 04/05/2016   INR 1.30 03/29/2016    ANTI-COAG DOSE: Anticoagulation Dose Instructions as of 04/26/2016      [name] Tue Wed Thu Fri Sat   New Dose 4 mg 6 mg 6 mg 6 mg 6 mg 6 mg 4 mg       ANTICOAG SUMMARY: Anticoagulation Episode Summary    Current INR goal 2.0-3.0  Next INR check 05/10/2016  INR from last check 2.00 (04/26/2016)  Weekly max dose   Target end date   [facility]  Preferred lab   Send INR reminders to    Indications  Long term current use of anticoagulant therapy [Z79.01] Peripheral arterial occlusive disease ([name]) [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[name]) [id number]        Comments         ANTICOAG TODAY: Anticoagulation Summary as of 04/26/2016    INR goal 2.0-3.0  Selected INR 2.00 (04/26/2016)  Next INR check 05/10/2016  Target end date    Indications  Long term current use of anticoagulant therapy [Z79.01] Peripheral arterial occlusive disease ([place]) [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  Patient instructed to take today's dose.  Patient verbalized understanding of these instructions.       FOLLOW-UP Return in 2 weeks (on 05/10/2016) for Follow up INR at 4:15PM.  Jorene Guest, III Pharm.D., CACP

## 2016-05-10 ENCOUNTER — Ambulatory Visit: Payer: Self-pay

## 2016-05-13 ENCOUNTER — Other Ambulatory Visit: Payer: Self-pay

## 2016-05-13 DIAGNOSIS — M545 Low back pain, unspecified: Secondary | ICD-10-CM

## 2016-05-13 DIAGNOSIS — G8929 Other chronic pain: Secondary | ICD-10-CM

## 2016-05-13 MED ORDER — OXYCODONE-ACETAMINOPHEN 7.5-325 MG PO TABS: 1.0000 | ORAL_TABLET | Freq: Three times a day (TID) | ORAL | Status: DC | PRN

## 2016-05-13 NOTE — Telephone Encounter (Signed)
Notified patient.

## 2016-05-13 NOTE — Telephone Encounter (Signed)
Pt requesting percocet to be filled. °

## 2016-05-13 NOTE — Telephone Encounter (Signed)
Last visit: 03/12/16 Next appointment: 05/17/16 Last UDS: ? Last Written: 02/05/16

## 2016-05-17 ENCOUNTER — Ambulatory Visit (INDEPENDENT_AMBULATORY_CARE_PROVIDER_SITE_OTHER): Payer: Medicare Other | Admitting: Pharmacist

## 2016-05-17 DIAGNOSIS — T82868D Thrombosis of vascular prosthetic devices, implants and grafts, subsequent encounter: Secondary | ICD-10-CM | POA: Diagnosis present

## 2016-05-17 DIAGNOSIS — I779 Disorder of arteries and arterioles, unspecified: Secondary | ICD-10-CM | POA: Diagnosis not present

## 2016-05-17 DIAGNOSIS — Y832 Surgical operation with anastomosis, bypass or graft as the cause of abnormal reaction of the patient, or of later complication, without mention of misadventure at the time of the procedure: Secondary | ICD-10-CM

## 2016-05-17 DIAGNOSIS — Z7901 Long term (current) use of anticoagulants: Secondary | ICD-10-CM

## 2016-05-17 LAB — POCT INR: INR: 3.2

## 2016-05-17 NOTE — Patient Instructions (Addendum)
Patient educated about medication as defined in this encounter and verbalized understanding by repeating back instructions provided.   

## 2016-05-17 NOTE — Progress Notes (Signed)
Anticoagulation Management Kristin Knight is a 70 y.o. female who reports to the clinic for monitoring of warfarin treatment.    Indication: history of vascular graft thrombosis Duration: indefinite  Anticoagulation Clinic Visit History: Patient does report signs/symptoms of bleeding or thromboembolism  Anticoagulation Episode Summary    Current INR goal 2.0-3.0  Next INR check 06/14/2016  INR from last check 3.2! 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[place]) [id number]        Comments        ASSESSMENT Recent Results: The most recent result is correlated with 38 mg per week: Lab Results  Component Value Date   INR 3.2 05/17/2016   INR 2.00 04/26/2016   INR 1.90 04/05/2016   Anticoagulation Dosing: INR as of 05/17/2016 and Previous Dosing Information    INR Dt INR Goal [employer] Sun Mon Tue Wed Thu Fri Sat   05/17/2016 3.2 2.0-3.0 38 mg 4 mg 6 mg 6 mg 6 mg 6 mg 6 mg 4 mg    Anticoagulation Dose Instructions as of 05/17/2016      Total Sun Mon Tue Wed Thu Fri Sat   New Dose 36 mg 6 mg 4 mg 6 mg 4 mg 6 mg 6 mg 4 mg     (4 mg x 1.5)  (4 mg x 1)  (4 mg x 1.5)  (4 mg x 1)  (4 mg x 1.5)  (4 mg x 1.5)  (4 mg x 1)                           INR today: Supratherapeutic  PLAN Weekly dose was decreased by 5% to 36 mg per week  Patient Instructions  Patient educated about medication as defined in this encounter and verbalized understanding by repeating back instructions provided.    Patient advised to contact clinic or seek medical attention if signs/symptoms of bleeding or thromboembolism occur.  Patient verbalized understanding by repeating back information and was advised to contact me if further medication-related questions arise. Patient  was also provided an information handout.  Follow-up Return in about 4 weeks (around 06/14/2016) for Follow up INR around 06/14/2016 at 1:30pm.  Kim,Jennifer J  15 minutes spent face-to-face with the patient during the encounter. 50% of time spent on education. 50% of time was spent on assessment and plan.

## 2016-05-19 NOTE — Progress Notes (Signed)
INTERNAL MEDICINE TEACHING ATTENDING ADDENDUM - Lalla Brothers M.D  Duration- indefinite, Indication- vascular graft thrombosis, INR- supratherapeutic. Agree with pharmacy recommendations as outlined in their note.

## 2016-06-14 ENCOUNTER — Ambulatory Visit (INDEPENDENT_AMBULATORY_CARE_PROVIDER_SITE_OTHER): Payer: Medicare Other | Admitting: Pharmacist

## 2016-06-14 DIAGNOSIS — T82868D Thrombosis of vascular prosthetic devices, implants and grafts, subsequent encounter: Secondary | ICD-10-CM | POA: Diagnosis present

## 2016-06-14 DIAGNOSIS — I779 Disorder of arteries and arterioles, unspecified: Secondary | ICD-10-CM | POA: Diagnosis not present

## 2016-06-14 DIAGNOSIS — Y712 Prosthetic and other implants, materials and accessory cardiovascular devices associated with adverse incidents: Secondary | ICD-10-CM

## 2016-06-14 DIAGNOSIS — Z7901 Long term (current) use of anticoagulants: Secondary | ICD-10-CM | POA: Diagnosis not present

## 2016-06-14 LAB — POCT INR: INR: 3.7

## 2016-06-14 MED ORDER — WARFARIN SODIUM 4 MG PO TABS
ORAL_TABLET | ORAL | 2 refills | Status: DC
Start: 2016-06-14 — End: 2016-09-29

## 2016-06-14 NOTE — Progress Notes (Signed)
I reviewed Dr. Julianne Rice note.  Reason for [facility] was clotted vascular graft.  INR elevated and coumadin decreased.

## 2016-06-14 NOTE — Progress Notes (Addendum)
Anticoagulation Management Kristin Knight is a 70 y.o. female who reports to the clinic for monitoring of warfarin treatment.    Indication: vascular graft thrombosis Duration: indefinite  Anticoagulation Clinic Visit History: Patient does not report signs/symptoms of bleeding or thromboembolism or other recent changes. Anticoagulation Episode Summary    Current INR goal:   2.0-3.0  TTR:   56.4 % (3.6 y)  Next INR check:   06/28/2016  INR from last check:   3.7! 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[place]) [id number]       Comments:          ASSESSMENT Recent Results: The most recent result is correlated with 36 mg per week: Lab Results  Component Value Date   INR 3.7 06/14/2016   INR 3.2 05/17/2016   INR 2.00 04/26/2016   Anticoagulation Dosing: INR as of 06/14/2016 and Previous Dosing Information    INR Dt INR Goal [employer] Sun Mon Tue Wed Thu Fri Sat   06/14/2016 3.7 2.0-3.0 36 mg 6 mg 4 mg 6 mg 4 mg 6 mg 6 mg 4 mg    Anticoagulation Dose Instructions as of 06/14/2016      Total Sun Mon Tue Wed Thu Fri Sat   New Dose 34 mg 6 mg 4 mg 4 mg 4 mg 6 mg 6 mg 4 mg     (4 mg x 1.5)  (4 mg x 1)  (4 mg x 1)  (4 mg x 1)  (4 mg x 1.5)  (4 mg x 1.5)  (4 mg x 1)                         Description   Hold x 1     INR today: Supratherapeutic  PLAN Weekly dose was decreased by 6% to 34 mg per week (including held dose today, would be a 16% reduction)  Patient Instructions  Patient educated about medication as defined in this encounter and verbalized understanding by repeating back instructions provided.   Patient advised to contact clinic or seek medical attention if signs/symptoms of bleeding or thromboembolism  occur.  Patient verbalized understanding by repeating back information and was advised to contact me if further medication-related questions arise. Patient was also provided an information handout.  Follow-up Follow up INR 06/21/2016 at 8:30am  Raymir Frommelt J  15 minutes spent face-to-face with the patient during the encounter. 50% of time spent on education. 50% of time was spent on assessment and plan.

## 2016-06-14 NOTE — Patient Instructions (Signed)
Patient educated about medication as defined in this encounter and verbalized understanding by repeating back instructions provided.   

## 2016-06-21 ENCOUNTER — Ambulatory Visit: Payer: Self-pay

## 2016-08-16 ENCOUNTER — Ambulatory Visit (INDEPENDENT_AMBULATORY_CARE_PROVIDER_SITE_OTHER): Payer: Medicare Other | Admitting: Pharmacist

## 2016-08-16 DIAGNOSIS — T82868D Thrombosis of vascular prosthetic devices, implants and grafts, subsequent encounter: Secondary | ICD-10-CM | POA: Diagnosis present

## 2016-08-16 DIAGNOSIS — I779 Disorder of arteries and arterioles, unspecified: Secondary | ICD-10-CM

## 2016-08-16 DIAGNOSIS — Y712 Prosthetic and other implants, materials and accessory cardiovascular devices associated with adverse incidents: Secondary | ICD-10-CM

## 2016-08-16 DIAGNOSIS — Z7901 Long term (current) use of anticoagulants: Secondary | ICD-10-CM | POA: Diagnosis not present

## 2016-08-16 LAB — POCT INR: INR: 3.5

## 2016-08-16 NOTE — Progress Notes (Signed)
Anti-Coagulation Progress Note  Kristin Knight is a 70 y.o. female who is currently on an anti-coagulation regimen.    RECENT RESULTS: Recent results are below, the most recent result is correlated with a dose of 28 mg. per week (she had one omitted dose this past Thursday--by accident): Lab Results  Component Value Date   INR 3.50 08/16/2016   INR 3.7 06/14/2016   INR 3.2 05/17/2016    ANTI-COAG DOSE: Anticoagulation Dose Instructions as of 08/16/2016      [name] Tue Wed Thu Fri Sat   New Dose 4 mg 4 mg 4 mg 6 mg 0 mg 4 mg 4 mg    Description   hold      ANTICOAG SUMMARY: Anticoagulation Episode Summary    Current INR goal:   2.0-3.0  TTR:   53.9 % (3.8 y)  Next INR check:   09/06/2016  INR from last check:   3.50! 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   FOLLOW-UP Return in 3 weeks (on 09/06/2016) for Follow up INR at 0845h.  Jorene Guest, III Pharm.D., CACP

## 2016-08-16 NOTE — Patient Instructions (Signed)
Patient instructed to take medications as defined in the Anti-coagulation Track section of this encounter.  Patient instructed to take today's dose.  Patient verbalized understanding of these instructions.    

## 2016-09-06 ENCOUNTER — Ambulatory Visit: Payer: Self-pay

## 2016-09-20 ENCOUNTER — Other Ambulatory Visit: Payer: Self-pay

## 2016-09-20 DIAGNOSIS — G8929 Other chronic pain: Secondary | ICD-10-CM

## 2016-09-20 DIAGNOSIS — M545 Low back pain: Principal | ICD-10-CM

## 2016-09-20 NOTE — Telephone Encounter (Signed)
oxyCODONE-acetaminophen (PERCOCET) 7.5-325 MG tablet, Refill request.

## 2016-09-22 MED ORDER — OXYCODONE-ACETAMINOPHEN 7.5-325 MG PO TABS: 1.0000 | ORAL_TABLET | Freq: Three times a day (TID) | ORAL | 0 refills | Status: DC | PRN

## 2016-09-23 ENCOUNTER — Encounter (HOSPITAL_COMMUNITY): Payer: Self-pay | Admitting: Emergency Medicine

## 2016-09-23 ENCOUNTER — Emergency Department (HOSPITAL_BASED_OUTPATIENT_CLINIC_OR_DEPARTMENT_OTHER)
Admit: 2016-09-23 | Discharge: 2016-09-23 | Disposition: A | Discharge status: Home / Self Care | Payer: Medicare Other | Attending: Emergency Medicine | Admitting: Emergency Medicine

## 2016-09-23 ENCOUNTER — Emergency Department (HOSPITAL_COMMUNITY)
Admission: EM | Admit: 2016-09-23 | Discharge: 2016-09-23 | Disposition: A | Discharge status: Home / Self Care | Payer: Medicare Other | Attending: Emergency Medicine | Admitting: Emergency Medicine

## 2016-09-23 ENCOUNTER — Emergency Department (HOSPITAL_COMMUNITY): Payer: Medicare Other

## 2016-09-23 DIAGNOSIS — M79605 Pain in left leg: Secondary | ICD-10-CM | POA: Diagnosis not present

## 2016-09-23 DIAGNOSIS — F17210 Nicotine dependence, cigarettes, uncomplicated: Secondary | ICD-10-CM | POA: Diagnosis not present

## 2016-09-23 DIAGNOSIS — Z85528 Personal history of other malignant neoplasm of kidney: Secondary | ICD-10-CM | POA: Insufficient documentation

## 2016-09-23 DIAGNOSIS — R52 Pain, unspecified: Secondary | ICD-10-CM

## 2016-09-23 DIAGNOSIS — Z7901 Long term (current) use of anticoagulants: Secondary | ICD-10-CM | POA: Insufficient documentation

## 2016-09-23 DIAGNOSIS — I10 Essential (primary) hypertension: Secondary | ICD-10-CM | POA: Diagnosis not present

## 2016-09-23 DIAGNOSIS — M25552 Pain in left hip: Secondary | ICD-10-CM | POA: Diagnosis not present

## 2016-09-23 DIAGNOSIS — Z79899 Other long term (current) drug therapy: Secondary | ICD-10-CM | POA: Insufficient documentation

## 2016-09-23 DIAGNOSIS — M79609 Pain in unspecified limb: Secondary | ICD-10-CM | POA: Diagnosis not present

## 2016-09-23 DIAGNOSIS — J449 Chronic obstructive pulmonary disease, unspecified: Secondary | ICD-10-CM | POA: Diagnosis not present

## 2016-09-23 LAB — CBC WITH DIFFERENTIAL/PLATELET
BASOS ABS: 0 10*3/uL (ref 0.0–0.1)
BASOS PCT: 1 %
EOS PCT: 2 %
Eosinophils Absolute: 0.1 10*3/uL (ref 0.0–0.7)
HCT: 41 % (ref 36.0–46.0)
Hemoglobin: 13 g/dL (ref 12.0–15.0)
LYMPHS PCT: 44 %
Lymphs Abs: 2 10*3/uL (ref 0.7–4.0)
MCH: 28.6 pg (ref 26.0–34.0)
MCHC: 31.7 g/dL (ref 30.0–36.0)
MCV: 90.1 fL (ref 78.0–100.0)
MONO ABS: 0.3 10*3/uL (ref 0.1–1.0)
Monocytes Relative: 6 %
NEUTROS ABS: 2.1 10*3/uL (ref 1.7–7.7)
Neutrophils Relative %: 47 %
PLATELETS: 258 10*3/uL (ref 150–400)
RBC: 4.55 MIL/uL (ref 3.87–5.11)
RDW: 14.2 % (ref 11.5–15.5)
WBC: 4.4 10*3/uL (ref 4.0–10.5)

## 2016-09-23 LAB — COMPREHENSIVE METABOLIC PANEL
ALBUMIN: 4 g/dL (ref 3.5–5.0)
ALT: 8 U/L — AB (ref 14–54)
AST: 18 U/L (ref 15–41)
Alkaline Phosphatase: 57 U/L (ref 38–126)
Anion gap: 10 (ref 5–15)
BUN: 12 mg/dL (ref 6–20)
CHLORIDE: 102 mmol/L (ref 101–111)
CO2: 27 mmol/L (ref 22–32)
CREATININE: 0.85 mg/dL (ref 0.44–1.00)
Calcium: 9.7 mg/dL (ref 8.9–10.3)
GFR calc Af Amer: >60 mL/min (ref >60)
GFR calc non Af Amer: >60 mL/min (ref >60)
GLUCOSE: 89 mg/dL (ref 65–99)
POTASSIUM: 3.6 mmol/L (ref 3.5–5.1)
Sodium: 139 mmol/L (ref 135–145)
Total Bilirubin: 0.5 mg/dL (ref 0.3–1.2)
Total Protein: 7 g/dL (ref 6.5–8.1)

## 2016-09-23 LAB — PROTIME-INR
INR: 2.41
Prothrombin Time: 26.7 seconds — ABNORMAL HIGH (ref 11.4–15.2)

## 2016-09-23 MED ORDER — ACETAMINOPHEN 325 MG PO TABS: 650.0000 mg | ORAL_TABLET | Freq: Four times a day (QID) | ORAL | 0 refills | Status: DC | PRN
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
## 2016-09-23 NOTE — Progress Notes (Signed)
Preliminary results by tech - Left Lower Ext. Venous Duplex Completed. Negative for deep and superficial vein thrombosis and Baker's Cyst. A patent left femoral bypass graft was noted. Oda Cogan, BS, RDMS, RVT

## 2016-09-23 NOTE — ED Notes (Signed)
Pt is back from [place] and it was negative for DVT

## 2016-09-23 NOTE — ED Triage Notes (Signed)
Pt from home with c/o right leg pain worse with standing x 1 week, possibly longer.  Pt reports hx of blood clots and is currently on blood thinners.  Denies any injury. Pt reports mild swelling to her right knee. NAD, A&O.

## 2016-09-24 ENCOUNTER — Other Ambulatory Visit: Payer: Self-pay | Admitting: Internal Medicine

## 2016-09-24 DIAGNOSIS — I10 Essential (primary) hypertension: Secondary | ICD-10-CM

## 2016-09-27 ENCOUNTER — Telehealth: Payer: Self-pay

## 2016-09-27 NOTE — Telephone Encounter (Signed)
Want to know if Rx is ready. Please call pt back.  

## 2016-09-27 NOTE — Telephone Encounter (Signed)
Pt informed

## 2016-09-29 ENCOUNTER — Other Ambulatory Visit: Payer: Self-pay | Admitting: Internal Medicine

## 2016-09-29 DIAGNOSIS — T82868S Thrombosis of vascular prosthetic devices, implants and grafts, sequela: Secondary | ICD-10-CM

## 2016-10-14 ENCOUNTER — Telehealth: Payer: Self-pay | Admitting: Internal Medicine

## 2016-10-14 NOTE — Telephone Encounter (Signed)
APT. REMINDER CALL, LMTCB °

## 2016-10-15 ENCOUNTER — Ambulatory Visit (INDEPENDENT_AMBULATORY_CARE_PROVIDER_SITE_OTHER): Payer: Medicare Other | Admitting: Internal Medicine

## 2016-10-15 ENCOUNTER — Encounter: Payer: Self-pay | Admitting: Internal Medicine

## 2016-10-15 VITALS — BP 134/81 | HR 73 | Temp 97.9°F | Wt 115.2 lb

## 2016-10-15 DIAGNOSIS — Z7901 Long term (current) use of anticoagulants: Secondary | ICD-10-CM

## 2016-10-15 DIAGNOSIS — G8929 Other chronic pain: Secondary | ICD-10-CM | POA: Diagnosis not present

## 2016-10-15 DIAGNOSIS — J449 Chronic obstructive pulmonary disease, unspecified: Secondary | ICD-10-CM

## 2016-10-15 DIAGNOSIS — F331 Major depressive disorder, recurrent, moderate: Secondary | ICD-10-CM | POA: Diagnosis not present

## 2016-10-15 DIAGNOSIS — F17210 Nicotine dependence, cigarettes, uncomplicated: Secondary | ICD-10-CM

## 2016-10-15 DIAGNOSIS — I7389 Other specified peripheral vascular diseases: Secondary | ICD-10-CM | POA: Diagnosis not present

## 2016-10-15 DIAGNOSIS — Z79899 Other long term (current) drug therapy: Secondary | ICD-10-CM

## 2016-10-15 DIAGNOSIS — M545 Low back pain, unspecified: Secondary | ICD-10-CM

## 2016-10-15 DIAGNOSIS — M810 Age-related osteoporosis without current pathological fracture: Secondary | ICD-10-CM | POA: Diagnosis not present

## 2016-10-15 DIAGNOSIS — I10 Essential (primary) hypertension: Secondary | ICD-10-CM

## 2016-10-15 DIAGNOSIS — Z23 Encounter for immunization: Secondary | ICD-10-CM | POA: Diagnosis not present

## 2016-10-15 DIAGNOSIS — Z0000 Encounter for general adult medical examination without abnormal findings: Secondary | ICD-10-CM

## 2016-10-15 DIAGNOSIS — I779 Disorder of arteries and arterioles, unspecified: Secondary | ICD-10-CM

## 2016-10-15 DIAGNOSIS — I872 Venous insufficiency (chronic) (peripheral): Secondary | ICD-10-CM

## 2016-10-15 DIAGNOSIS — Z720 Tobacco use: Secondary | ICD-10-CM

## 2016-10-15 MED ORDER — OXYCODONE-ACETAMINOPHEN 7.5-325 MG PO TABS: 1.0000 | ORAL_TABLET | Freq: Three times a day (TID) | ORAL | 0 refills | Status: DC | PRN

## 2016-10-15 MED ORDER — ALBUTEROL SULFATE HFA 108 (90 BASE) MCG/ACT IN AERS: 2.0000 | INHALATION_SPRAY | Freq: Four times a day (QID) | RESPIRATORY_TRACT | 3 refills | Status: DC | PRN

## 2016-10-15 MED ORDER — LOVASTATIN 20 MG PO TABS: 20.0000 mg | ORAL_TABLET | Freq: Every day | ORAL | 3 refills | Status: DC

## 2016-10-15 MED ORDER — BUPROPION HCL ER (SR) 150 MG PO TB12: 150.0000 mg | ORAL_TABLET | Freq: Two times a day (BID) | ORAL | 0 refills | Status: DC

## 2016-10-15 NOTE — Assessment & Plan Note (Signed)
Assessment  She has not been taking her bisphosphonate therapy and is not interested in continuing with it. She has been compliant with her calcium and vitamin D.  Plan  We will continue with the calcium and vitamin D supplementation.

## 2016-10-15 NOTE — Assessment & Plan Note (Signed)
She was given the flu vaccination today. We did not draw a hepatitis C antibody today secondary to financial limitations.

## 2016-10-15 NOTE — Assessment & Plan Note (Signed)
Assessment  She has had no signs or symptoms suggestive of symptomatic peripheral arterial occlusive disease. She has remained compliant with her Coumadin therapy and has not had any graft thromboses since.  Plan  Her blood pressure is being aggressively controlled pharmacologically and she is interested in tobacco cessation. Please see the tobacco abuse section for further details. In addition, she was restarted on her statin therapy so that risk factor modification occurs. We will reassess for evidence of symptomatic peripheral vascular occlusive disease at the follow-up visit.

## 2016-10-15 NOTE — Assessment & Plan Note (Signed)
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 She feels that without this medication she would not be able to perform her job which she desperately needs for financial reasons.  Plan  We were unable to get a urine sample today for UDS secondary to her urinating just prior to coming to the clinic. We are continuing the oxycodone-acetaminophen 7.5-325 mg 1 tablet every 8 hours as needed for severe pain. She receives 60 per month. An additional prescription to get her through February was handed to her today. We will reassess the control of her pain and her function on this medication at the follow-up visit.

## 2016-10-15 NOTE — Assessment & Plan Note (Signed)
Assessment  She continues to smoke but would like to quit. She did not pick up the bupropion prescription that was ordered at the last clinic visit. She would like to try the bupropion to see if it helps her with quitting smoking.  Plan  I have prescribed bupropion 150 mg by mouth twice daily to begin 1 week prior to her attempt at smoking cessation. She was praised as to her desire to quit smoking and her willingness to try pharmacologic therapy. We will reassess the efficacy of this therapy at the follow-up visit.

## 2016-10-15 NOTE — Assessment & Plan Note (Signed)
Assessment  She had no evidence of decompensation of her chronic venous insufficiency today on examination.  Plan  We will continue to symptomatically assess for decompensation of her chronic venous insufficiency at subsequent clinic visits.

## 2016-10-15 NOTE — Progress Notes (Signed)
   Subjective:    Patient ID: Kristin Knight, female    DOB: 1946-02-11, 70 y.o.   MRN: [id number]  HPI  [name] is here for follow-up of her major depression, PVOD, HTN, tobacco abuse, COPD, back pain, and left leg pain. Please see the A&P for the status of the pt's chronic medical problems.  She was recently seen in the emergency department for left leg pain. This was felt to be arthritic in nature after workup revealed no evidence of an arterial clot. The pain has since returned to baseline and is functionally controlled on as needed oxycodone. She is without other acute complaints.  Review of Systems  Constitutional: Negative for activity change, appetite change, fatigue and unexpected weight change.  Respiratory: Negative for cough, chest tightness, shortness of breath and wheezing.   Cardiovascular: Negative for chest pain, palpitations and leg swelling.  Gastrointestinal: Positive for constipation. Negative for abdominal distention, abdominal pain, diarrhea, nausea and vomiting.       Recent constipation which improved with increased water intake  Musculoskeletal: Positive for arthralgias and back pain. Negative for gait problem, joint swelling and myalgias.  Skin: Negative for color change, rash and wound.  Psychiatric/Behavioral: Negative for dysphoric mood.      Objective:   Physical Exam  Constitutional: She is oriented to person, place, and time. She appears well-developed and well-nourished. No distress.  HENT:  Head: Normocephalic and atraumatic.  Eyes: Conjunctivae are normal. Right eye exhibits no discharge. Left eye exhibits no discharge. No scleral icterus.  Cardiovascular: Normal rate, regular rhythm and normal heart sounds.   Pulmonary/Chest: Effort normal and breath sounds normal. No respiratory distress. She has no wheezes. She has no rales.  Abdominal: Soft. She exhibits no distension. There is no tenderness. There is no rebound and no guarding.    Musculoskeletal: Normal range of motion. She exhibits no edema, tenderness or deformity.  Neurological: She is alert and oriented to person, place, and time. She exhibits normal muscle tone.  Skin: Skin is warm. No rash noted. She is not diaphoretic. No erythema.  Psychiatric: She has a normal mood and affect. Her behavior is normal. Judgment and thought content normal.  Nursing note and vitals reviewed.     Assessment & Plan:   Please see problem oriented charting.

## 2016-10-15 NOTE — Assessment & Plan Note (Signed)
Assessment  Her blood pressure today is well controlled at 134/81. This is on lisinopril 20 mg by mouth daily and triamterene-hydrochlorothiazide 37.5-25 mg daily.  Plan  We will continue the lisinopril and triamterene-hydrochlorothiazide at the current doses and reassess her blood pressure control at the follow-up visit.

## 2016-10-15 NOTE — Patient Instructions (Signed)
It was good to see you again.  I am glad you did not have a clot in your leg this last time.  1) I reordered the bupropion 150 mg twice daily.  This is to help you quit smoking.  Take it for 1 week before you try to quit.  I am proud of you that you want to quit and I hope this helps.  2) I reordered the albuterol inhaler (for your breathing) and the lovastatin (for your cholesterol).  3) I handed you one more green prescription for your pain medicine to get you through February.  4) I ordered a urine test today.  5) We gave you a flu shot today.  6) Keep taking your other medications as you are.  I will see you back in 6 months, sooner if necessary.  I hope you have a great holiday season!

## 2016-10-15 NOTE — Assessment & Plan Note (Signed)
Assessment  Her pulmonary symptoms are well controlled with minimal dyspnea on as needed albuterol.  Plan  We will continue with the as needed albuterol for her mild chronic obstructive pulmonary disease. A new prescription was written for her to pick up at the pharmacy so she has one available with her.

## 2016-10-15 NOTE — Assessment & Plan Note (Signed)
Assessment  She states her depression symptoms are well controlled on the mirtazapine as is her appetite. She therefore wishes to make no changes in her current regimen.  Plan  We will continue the mirtazapine 15 mg by mouth at bedtime. We will reassess for signs or symptoms consistent with an exacerbation of her major depression.

## 2016-11-01 ENCOUNTER — Ambulatory Visit (INDEPENDENT_AMBULATORY_CARE_PROVIDER_SITE_OTHER): Payer: Medicare Other | Admitting: Pharmacist

## 2016-11-01 DIAGNOSIS — I779 Disorder of arteries and arterioles, unspecified: Secondary | ICD-10-CM | POA: Diagnosis not present

## 2016-11-01 DIAGNOSIS — Z7901 Long term (current) use of anticoagulants: Secondary | ICD-10-CM | POA: Diagnosis not present

## 2016-11-01 DIAGNOSIS — T82868D Thrombosis of vascular prosthetic devices, implants and grafts, subsequent encounter: Secondary | ICD-10-CM

## 2016-11-01 LAB — POCT INR: INR: 2.1

## 2016-11-01 NOTE — Progress Notes (Signed)
Anti-Coagulation Progress Note  Shaneqwa Boberg is a 70 y.o. female who is currently on an anti-coagulation regimen.    RECENT RESULTS: Recent results are below, the most recent result is correlated with a dose of 30 mg. per week: Lab Results  Component Value Date   INR 2.10 11/01/2016   INR 2.41 09/23/2016   INR 3.50 08/16/2016    ANTI-COAG DOSE: Anticoagulation Dose Instructions as of 11/01/2016      [name] Tue Wed Thu Fri Sat   New Dose 4 mg 6 mg 4 mg 4 mg 6 mg 4 mg 4 mg       ANTICOAG SUMMARY: Anticoagulation Episode Summary    Current INR goal:   2.0-3.0  TTR:   55.1 % (4 y)  Next INR check:   12/06/2016  INR from last check:   2.10 (11/01/2016)  Weekly max dose:     Target end date:     [facility]   [facility]  Preferred lab:     Send INR reminders to:      Indications   Long term current use of anticoagulant therapy [Z79.01] Peripheral arterial occlusive disease (HCC) [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[place]) [id number]       Comments:           ANTICOAG TODAY: Anticoagulation Summary  As of 11/01/2016   INR goal:   2.0-3.0  TTR:     Today's INR:   2.10  Next INR check:   12/06/2016  Target end date:      Indications   Long term current use of anticoagulant therapy [Z79.01] Peripheral arterial occlusive disease ([place]) [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  Patient instructed to take today's dose.  Patient instructed to take 1 tablet of your 4mg  warfarin by mouth daily--EXCEPT on Mondays and Thursdays---take 1&1/2 tablets on these days.  Patient verbalized understanding of these instructions.     FOLLOW-UP Return in 5 weeks (on 12/06/2016) for  Follow up INR at 0900h.  Jorene Guest, III Pharm.D., CACP

## 2016-11-01 NOTE — Patient Instructions (Signed)
Patient instructed to take medications as defined in the Anti-coagulation Track section of this encounter.  Patient instructed to take today's dose.  Patient instructed to take 1 tablet of your 4mg  warfarin by mouth daily--EXCEPT on Mondays and Thursdays---take 1&1/2 tablets on these days.  Patient verbalized understanding of these instructions.

## 2016-11-01 NOTE — Progress Notes (Signed)
INTERNAL MEDICINE TEACHING ATTENDING ADDENDUM - Davy Westmoreland M.D  Duration- indefinite, Indication- recurrent thrombosis of fem pop graft, INR- therapeutic. Agree with pharmacy recommendations as outlined in their note.

## 2016-11-19 ENCOUNTER — Other Ambulatory Visit: Payer: Self-pay | Admitting: Pharmacist

## 2016-11-19 DIAGNOSIS — T82868S Thrombosis of vascular prosthetic devices, implants and grafts, sequela: Secondary | ICD-10-CM

## 2016-11-19 MED ORDER — WARFARIN SODIUM 4 MG PO TABS: ORAL_TABLET | ORAL | 2 refills | Status: DC

## 2016-12-06 ENCOUNTER — Ambulatory Visit (INDEPENDENT_AMBULATORY_CARE_PROVIDER_SITE_OTHER): Payer: Medicare Other | Admitting: Pharmacist

## 2016-12-06 DIAGNOSIS — I779 Disorder of arteries and arterioles, unspecified: Secondary | ICD-10-CM | POA: Diagnosis not present

## 2016-12-06 DIAGNOSIS — T82868D Thrombosis of vascular prosthetic devices, implants and grafts, subsequent encounter: Secondary | ICD-10-CM | POA: Diagnosis not present

## 2016-12-06 DIAGNOSIS — T82868S Thrombosis of vascular prosthetic devices, implants and grafts, sequela: Secondary | ICD-10-CM

## 2016-12-06 DIAGNOSIS — Z7901 Long term (current) use of anticoagulants: Secondary | ICD-10-CM

## 2016-12-06 LAB — POCT INR: INR: 2.7

## 2016-12-06 NOTE — Patient Instructions (Signed)
Patient instructed to take medications as defined in the Anti-coagulation Track section of this encounter.  Patient instructed to take today's dose.  Patient instructed to take  1&1/2 tablets of your blue-colored 4mg strength warfarin tablets on Mondays and Thursdays; all other days--take only 1 tablet.  Patient verbalized understanding of these instructions.    

## 2016-12-06 NOTE — Progress Notes (Signed)
INTERNAL MEDICINE TEACHING ATTENDING ADDENDUM - Lenda Baratta M.D  °Duration- indefinite, Indication- recurrent vascular graft thrombosis, INR- therapeutic. Agree with pharmacy recommendations as outlined in their note.  ° ° ° °

## 2016-12-06 NOTE — Progress Notes (Signed)
Anti-Coagulation Progress Note  Kristin Knight is a 71 y.o. female who is currently on an anti-coagulation regimen.    RECENT RESULTS: Recent results are below, the most recent result is correlated with a dose of 32 mg. per week: Lab Results  Component Value Date   INR 2.70 12/06/2016   INR 2.10 11/01/2016   INR 2.41 09/23/2016    ANTI-COAG DOSE: Anticoagulation Dose Instructions as of 12/06/2016      [name] Tue Wed Thu Fri Sat   New Dose 4 mg 6 mg 4 mg 4 mg 6 mg 4 mg 4 mg    Description   Take 1&1/2 tablets of your blue-colored 4mg  strength warfarin tablets on Mondays and Thursdays; all other days--take only 1 tablet.       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[place]) [id number]       Comments:           ANTICOAG TODAY: Anticoagulation Summary  As of 12/06/2016   INR goal:   2.0-3.0  TTR:     Today's INR:   2.70  Next INR check:   01/03/2017  Target end date:      Indications   Long term current use of anticoagulant therapy [Z79.01] Peripheral arterial occlusive disease ([place]) [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  Patient instructed to take today's dose.  Patient instructed to take 1&1/2 tablets of your blue-colored 4mg  strength  warfarin tablets on Mondays and Thursdays; all other days--take only 1 tablet.  Patient verbalized understanding of these instructions.       FOLLOW-UP Return in 4 weeks (on 01/03/2017) for Follow up INR at 0930.  Jorene Guest, III Pharm.D., CACP

## 2016-12-17 ENCOUNTER — Other Ambulatory Visit: Payer: Self-pay | Admitting: Pharmacist

## 2016-12-17 DIAGNOSIS — T82868S Thrombosis of vascular prosthetic devices, implants and grafts, sequela: Secondary | ICD-10-CM

## 2016-12-17 MED ORDER — WARFARIN SODIUM 4 MG PO TABS: ORAL_TABLET | ORAL | 2 refills | Status: DC

## 2016-12-31 NOTE — Addendum Note (Signed)
Addended by: Orson Gear on: 12/31/2016 02:46 PM   Modules accepted: Orders

## 2017-01-03 ENCOUNTER — Ambulatory Visit (INDEPENDENT_AMBULATORY_CARE_PROVIDER_SITE_OTHER): Payer: Medicare Other | Admitting: Pharmacist

## 2017-01-03 DIAGNOSIS — Z7901 Long term (current) use of anticoagulants: Secondary | ICD-10-CM

## 2017-01-03 DIAGNOSIS — I779 Disorder of arteries and arterioles, unspecified: Secondary | ICD-10-CM

## 2017-01-03 DIAGNOSIS — T82868D Thrombosis of vascular prosthetic devices, implants and grafts, subsequent encounter: Secondary | ICD-10-CM

## 2017-01-03 DIAGNOSIS — T82868S Thrombosis of vascular prosthetic devices, implants and grafts, sequela: Secondary | ICD-10-CM

## 2017-01-03 LAB — POCT INR: INR: 2.9

## 2017-01-03 NOTE — Patient Instructions (Signed)
Patient instructed to take medications as defined in the Anti-coagulation Track section of this encounter.  Patient instructed to take today's dose. Patient instructed to take 1 tablet of your blue colored 4mg  strength warfarin tablets by mouth once-daily at 6PM--EXCEPT on Wednesdays, then take 1&1/2 tablets.   Patient verbalized understanding of these instructions.

## 2017-01-03 NOTE — Progress Notes (Signed)
Anti-Coagulation Progress Note  July Czepiel is a 71 y.o. female who is currently on an anti-coagulation regimen.    RECENT RESULTS: Recent results are below, the most recent result is correlated with a dose of 32 mg. per week: Lab Results  Component Value Date   INR 2.90 01/03/2017   INR 2.70 12/06/2016   INR 2.10 11/01/2016    ANTI-COAG DOSE: Anticoagulation Dose Instructions as of 01/03/2017      [name] Tue Wed Thu Fri Sat   New Dose 4 mg 4 mg 4 mg 6 mg 4 mg 4 mg 4 mg    Description   Take 1&1/2 tablets of your blue-colored 4mg  strength warfarin tablets on Wednesdays; all other days--take only 1 tablet.       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[place]) [id number]       Comments:           ANTICOAG TODAY: Anticoagulation Summary  As of 01/03/2017   INR goal:   2.0-3.0  TTR:     Today's INR:   2.90  Next INR check:   01/31/2017  Target end date:      Indications   Long term current use of anticoagulant therapy [Z79.01] Peripheral arterial occlusive disease ([place]) [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  Patient instructed to take today's dose. Patient instructed to take 1 tablet of your blue colored 4mg  strength warfarin tablets by mouth once-daily at  6PM--EXCEPT on Wednesdays, then take 1&1/2 tablets.   Patient verbalized understanding of these instructions.     FOLLOW-UP Return in 4 weeks (on 01/31/2017) for Follow up INR at 0945h.  Jorene Guest, III Pharm.D., CACP

## 2017-01-03 NOTE — Progress Notes (Signed)
Indication: Vascular graft thrombosis X 3. Duration: Indefinite. INR: At target. Agree with Dr. Gladstone Pih assessment and plan.

## 2017-01-04 ENCOUNTER — Other Ambulatory Visit: Payer: Self-pay | Admitting: Internal Medicine

## 2017-01-04 DIAGNOSIS — I10 Essential (primary) hypertension: Secondary | ICD-10-CM

## 2017-02-21 ENCOUNTER — Ambulatory Visit (INDEPENDENT_AMBULATORY_CARE_PROVIDER_SITE_OTHER): Payer: Medicare Other

## 2017-02-21 ENCOUNTER — Other Ambulatory Visit: Payer: Medicare Other

## 2017-02-21 ENCOUNTER — Other Ambulatory Visit: Payer: Self-pay | Admitting: *Deleted

## 2017-02-21 DIAGNOSIS — F1190 Opioid use, unspecified, uncomplicated: Secondary | ICD-10-CM

## 2017-02-21 DIAGNOSIS — T82868S Thrombosis of vascular prosthetic devices, implants and grafts, sequela: Secondary | ICD-10-CM

## 2017-02-21 DIAGNOSIS — I779 Disorder of arteries and arterioles, unspecified: Secondary | ICD-10-CM | POA: Diagnosis not present

## 2017-02-21 DIAGNOSIS — M545 Low back pain: Principal | ICD-10-CM

## 2017-02-21 DIAGNOSIS — F331 Major depressive disorder, recurrent, moderate: Secondary | ICD-10-CM

## 2017-02-21 DIAGNOSIS — Z7901 Long term (current) use of anticoagulants: Secondary | ICD-10-CM

## 2017-02-21 DIAGNOSIS — G8929 Other chronic pain: Secondary | ICD-10-CM

## 2017-02-21 DIAGNOSIS — F1290 Cannabis use, unspecified, uncomplicated: Secondary | ICD-10-CM

## 2017-02-21 LAB — POCT INR
INR: 2.4
INR: 2.4

## 2017-02-21 MED ORDER — OXYCODONE-ACETAMINOPHEN 7.5-325 MG PO TABS: 1.0000 | ORAL_TABLET | Freq: Three times a day (TID) | ORAL | 0 refills | Status: DC | PRN

## 2017-02-21 MED ORDER — MIRTAZAPINE 15 MG PO TABS: 15.0000 mg | ORAL_TABLET | Freq: Every day | ORAL | 3 refills | Status: DC

## 2017-02-21 NOTE — Progress Notes (Signed)
Indication: Recurrent vascular graft thrombosis. Duration: Indefinite. INR: At target. Agree with Dr. Dereck Ligas assessment and plan.

## 2017-02-21 NOTE — Patient Instructions (Signed)
Patient instructed to take medications as defined in the Anti-coagulation Track section of this encounter.  Patient instructed to take today's dose. Patient instructed to continue taking 1&1/2 tablets of your blue-colored 4mg  strength warfarin tablets on Wednesdays; all other days--take only 1 tablet.  Patient verbalized understanding of these instructions.

## 2017-02-21 NOTE — Progress Notes (Signed)
Anti-Coagulation Progress Note  Kristin Knight is a 71 y.o. female who is currently on an anti-coagulation regimen.    RECENT RESULTS: Recent results are below, the most recent result is correlated with a dose of 30 mg. per week: Lab Results  Component Value Date   INR 2.4 02/21/2017   INR 2.4 02/21/2017   INR 2.90 01/03/2017    ANTI-COAG DOSE: Anticoagulation Dose Instructions as of 02/21/2017      [name] Tue Wed Thu Fri Sat   New Dose 4 mg 4 mg 4 mg 6 mg 4 mg 4 mg 4 mg    Description   Take 1&1/2 tablets of your blue-colored 4mg  strength warfarin tablets on Wednesdays; all other days--take only 1 tablet.       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[place]) [L93.570V]       Comments:           ANTICOAG TODAY: Anticoagulation Summary  As of 02/21/2017   INR goal:   2.0-3.0  TTR:     Today's INR:   2.4  Next INR check:   04/04/2017  Target end date:      Indications   Long term current use of anticoagulant therapy [Z79.01] Peripheral arterial occlusive disease ([place]) [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  Patient instructed to take today's dose. Patient instructed to continue taking 1&1/2 tablets of your blue-colored 4mg  strength warfarin tablets  on Wednesdays; all other days--take only 1 tablet.  Patient verbalized understanding of these instructions.       FOLLOW-UP Return in about 6 weeks (around 04/04/2017) for follow up INR at [place], PharmD PGY1 Pharmacy Resident Pager: [phone] 02/21/2017 3:07 PM

## 2017-02-21 NOTE — Telephone Encounter (Signed)
A UDS was ordered for today.  Review of the [place] was without red flags.  She was given three prescriptions for Oxycodone-Acetaminophen 7.5-325 mg 1 tablet every 8 hours as needed for severe back pain, Disp #60, which will get her through the beginning of July.

## 2017-03-02 LAB — TOXASSURE SELECT,+ANTIDEPR,UR

## 2017-03-07 DIAGNOSIS — F1290 Cannabis use, unspecified, uncomplicated: Secondary | ICD-10-CM

## 2017-03-07 HISTORY — DX: Cannabis use, unspecified, uncomplicated: F12.90

## 2017-03-07 NOTE — Progress Notes (Signed)
UDS appropriate for oxycodone.  An unexpected result was THC.  We will continue the oxycodone as written.

## 2017-03-22 ENCOUNTER — Other Ambulatory Visit: Payer: Self-pay

## 2017-03-22 DIAGNOSIS — T82868S Thrombosis of vascular prosthetic devices, implants and grafts, sequela: Secondary | ICD-10-CM

## 2017-03-22 NOTE — Telephone Encounter (Signed)
warfarin (COUMADIN) 4 MG tablet, refill request @ [facility] on [address]

## 2017-03-23 MED ORDER — WARFARIN SODIUM 4 MG PO TABS: ORAL_TABLET | ORAL | 2 refills | Status: DC

## 2017-03-23 NOTE — Telephone Encounter (Signed)
Pt need med by today per patient. Please call back.

## 2017-03-23 NOTE — Telephone Encounter (Signed)
Informed patient warfarin has been sent to [facility].

## 2017-04-04 ENCOUNTER — Ambulatory Visit (INDEPENDENT_AMBULATORY_CARE_PROVIDER_SITE_OTHER): Payer: Medicare Other | Admitting: Pharmacist

## 2017-04-04 DIAGNOSIS — T82868D Thrombosis of vascular prosthetic devices, implants and grafts, subsequent encounter: Secondary | ICD-10-CM

## 2017-04-04 DIAGNOSIS — I779 Disorder of arteries and arterioles, unspecified: Secondary | ICD-10-CM

## 2017-04-04 DIAGNOSIS — Z7901 Long term (current) use of anticoagulants: Secondary | ICD-10-CM

## 2017-04-04 LAB — POCT INR: INR: 2.1

## 2017-04-04 NOTE — Progress Notes (Signed)
Indication: Recurrent vascular graft thrombosis. Duration: Indefinite. INR: At target. Agree with Dr. Gladstone Pih assessment and plan.

## 2017-04-04 NOTE — Progress Notes (Signed)
Anti-Coagulation Progress Note  Kristin Knight is a 71 y.o. female who is currently on an anti-coagulation regimen for long-term use of anticoagulant therapy [Z79.01] for Peripheral arterial occlusive disease ([place]) [177.9], vascular graft thrombosis ([place]) [T82.868A] who has been on anticoagulation within our clinic since 10/02/2012. We suspect she will remain on this medication for this indication until such time that her vein and vascular physician deems otherwise. Annual re-assessment of risks vs. benefits of chronic anticoagulation should be discussed with patient as appropriate.   RECENT RESULTS: Recent results are below, the most recent result is correlated with a dose of 30 mg. per week: Lab Results  Component Value Date   INR 2.10 04/04/2017   INR 2.4 02/21/2017   INR 2.4 02/21/2017    ANTI-COAG DOSE: Anticoagulation Warfarin Dose Instructions as of 04/04/2017      [name] Tue Wed Thu Fri Sat   New Dose 4 mg 6 mg 4 mg 4 mg 6 mg 4 mg 4 mg    Description   Take 1&1/2 tablets of your blue-colored 4mg  strength warfarin tablets on Mondays and Thursdays; all other days--take only 1 tablet.       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[place]) [A76.811X]       Comments:           ANTICOAG TODAY: Anticoagulation Summary  As of 04/04/2017   INR goal:   2.0-3.0  TTR:     Today's INR:   2.10  Next INR check:   05/02/2017  Target end date:      Indications   Long term current use of anticoagulant therapy [Z79.01] Peripheral arterial occlusive disease ([place])  [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  Patient instructed to take today's dose.  Patient instructed to take  1&1/2 tablets of your blue-colored 4mg  strength warfarin tablets on Mondays and Thursdays; all other days--take only 1 tablet.  Patient verbalized understanding of these instructions.       FOLLOW-UP Return in 4 weeks (on 05/02/2017) for Follow up INR at 2:15PM.  Jorene Guest, III Pharm.D., CACP

## 2017-04-04 NOTE — Patient Instructions (Signed)
Patient instructed to take medications as defined in the Anti-coagulation Track section of this encounter.  Patient instructed to take today's dose.  Patient instructed to take  1&1/2 tablets of your blue-colored 4mg  strength warfarin tablets on Mondays and Thursdays; all other days--take only 1 tablet.  Patient verbalized understanding of these instructions.

## 2017-04-29 ENCOUNTER — Other Ambulatory Visit: Payer: Self-pay | Admitting: Internal Medicine

## 2017-04-29 DIAGNOSIS — Z1231 Encounter for screening mammogram for malignant neoplasm of breast: Secondary | ICD-10-CM

## 2017-05-02 ENCOUNTER — Ambulatory Visit (INDEPENDENT_AMBULATORY_CARE_PROVIDER_SITE_OTHER): Payer: Medicare Other | Admitting: Pharmacist

## 2017-05-02 DIAGNOSIS — T82868S Thrombosis of vascular prosthetic devices, implants and grafts, sequela: Secondary | ICD-10-CM

## 2017-05-02 DIAGNOSIS — I779 Disorder of arteries and arterioles, unspecified: Secondary | ICD-10-CM

## 2017-05-02 DIAGNOSIS — Z7901 Long term (current) use of anticoagulants: Secondary | ICD-10-CM

## 2017-05-02 LAB — POCT INR: INR: 3.5

## 2017-05-02 NOTE — Patient Instructions (Signed)
Patient instructed to take medications as defined in the Anti-coagulation Track section of this encounter.  Patient instructed to take today's dose.  Patient instructed to take 1 tablet of your 4mg  strength, blue warfarin tablets by mouth, once-daily--at 6PM.  Patient verbalized understanding of these instructions.

## 2017-05-02 NOTE — Progress Notes (Signed)
Anticoagulation Management Kristin Knight is a 71 y.o. female who reports to the clinic for monitoring of warfarin treatment.    Indication:  Long term current use of anticoagulant therapy, peripheral aterial occlusive disease and vascular graft thrombosis.  Duration: indefinite Supervising physician: [place] [facility] Visit History: Patient does not report signs/symptoms of bleeding or thromboembolism  Other recent changes: No diet, medications, lifestyle reported. Anticoagulation Episode Summary    Current INR goal:   2.0-3.0  TTR:   59.5 % (4.5 y)  Next INR check:   05/16/2017  INR from last check:   3.50! 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) [F75.102H]       Comments:          ASSESSMENT Recent Results: The most recent result is correlated with 32 mg per week: Lab Results  Component Value Date   INR 3.50 05/02/2017   INR 2.10 04/04/2017   INR 2.4 02/21/2017    Anticoagulation Dosing: INR as of 05/02/2017 and Previous Warfarin Dosing Information    INR Dt INR Goal [employer] Sun Mon Tue Wed Thu Fri Sat   05/02/2017 3.50 2.0-3.0 32 mg 4 mg 6 mg 4 mg 4 mg 6 mg 4 mg 4 mg    Previous description   Take 1&1/2 tablets of your blue-colored 4mg  strength warfarin tablets on Mondays and Thursdays; all other days--take only 1 tablet.    Anticoagulation Warfarin Dose Instructions as of 05/02/2017      Total Sun Mon Tue Wed Thu Fri Sat   New Dose 28 mg 4 mg 4 mg 4 mg 4 mg 4 mg 4 mg 4 mg     (4 mg x 1)  (4 mg x 1)  (4 mg x 1)  (4 mg x 1)  (4 mg x 1)  (4 mg x 1)  (4 mg x 1)                         Description   Take one (1) tablet of your blue-colored 4mg  strength warfarin tablets by mouth,  once-daily at [facility] each day.       INR today: Subtherapeutic  PLAN Weekly dose was decreased by 13% to 28 mg per week  Patient Instructions  Patient instructed to take medications as defined in the Anti-coagulation Track section of this encounter.  Patient instructed to take today's dose.  Patient instructed to take 1 tablet of your 4mg  strength, blue warfarin tablets by mouth, once-daily--at 6PM.  Patient verbalized understanding of these instructions.     Patient advised to contact clinic or seek medical attention if signs/symptoms of bleeding or thromboembolism occur.  Patient verbalized understanding by repeating back information and was advised to contact me if further medication-related questions arise. Patient was also provided an information handout.  Follow-up Return in 2 weeks (on 05/16/2017) for Follow  up INR at [place] [facility], Eustaquio Boyden PharmD, CACP, CPP  15 minutes spent face-to-face with the patient during the encounter. 50% of time spent on education. 50% of time was spent on finger-stick, point of care INR specimen collection, processing, interpretation of results and documentation  within the EMR/CHL and [email or web address] .

## 2017-05-05 NOTE — Progress Notes (Signed)
I have reviewed Dr. Gladstone Pih note.  Patient is on Kristin Knight for PVD and graft occlusion based on note. She had an elevated INR and her dose of coumadin was decreased.

## 2017-05-16 ENCOUNTER — Ambulatory Visit (INDEPENDENT_AMBULATORY_CARE_PROVIDER_SITE_OTHER): Payer: Medicare Other | Admitting: Pharmacist

## 2017-05-16 ENCOUNTER — Ambulatory Visit
Admission: RE | Admit: 2017-05-16 | Discharge: 2017-05-16 | Disposition: A | Discharge status: Home / Self Care | Payer: Medicare Other | Source: Ambulatory Visit | Attending: Internal Medicine | Admitting: Internal Medicine

## 2017-05-16 ENCOUNTER — Ambulatory Visit: Payer: Self-pay

## 2017-05-16 DIAGNOSIS — Z7901 Long term (current) use of anticoagulants: Secondary | ICD-10-CM

## 2017-05-16 DIAGNOSIS — T82868S Thrombosis of vascular prosthetic devices, implants and grafts, sequela: Secondary | ICD-10-CM

## 2017-05-16 DIAGNOSIS — Z1231 Encounter for screening mammogram for malignant neoplasm of breast: Secondary | ICD-10-CM | POA: Diagnosis not present

## 2017-05-16 DIAGNOSIS — I779 Disorder of arteries and arterioles, unspecified: Secondary | ICD-10-CM | POA: Diagnosis not present

## 2017-05-16 LAB — POCT INR: INR: 2.9

## 2017-05-16 NOTE — Progress Notes (Signed)
Anticoagulation Management Kristin Knight is a 71 y.o. female who reports to the clinic for monitoring of warfarin treatment.    Indication: Long term use of anticoagulation for peripheral arterial disease and vascular graft thrombosis after re-vascularization for PAD.  Duration: indefinite Supervising physician: [facility] Visit History: Patient does not report signs/symptoms of bleeding or thromboembolism  Other recent changes: No diet, medications, lifestyle changes endorsed by patient.  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     Anticoagulation Warfarin Dose Instructions as of 05/16/2017      Total Sun Mon Tue Wed Thu Fri Sat   New Dose 26 mg 4 mg 2 mg 4 mg 4 mg 4 mg 4 mg 4 mg     (4 mg x 1)  (4 mg x 0.5)  (4 mg x 1)  (4 mg x 1)  (4 mg x 1)  (4 mg x 1)  (4 mg x 1)                         Description   Take one (1) tablet of your blue-colored 4mg  strength warfarin tablets  by mouth, once-daily at 6PM each day--EXCEPT on MONDAYS, take ONLY 1/2 tablet on Mondays.        INR today: Therapeutic  PLAN Weekly dose was decreased by 1/2 tablet from 28mg /wk to 26mg /wk.   Patient Instructions  Patient instructed to take medications as defined in the Anti-coagulation Track section of this encounter.  Patient instructed to take today's dose.  Patient instructed to take one (1) tablet of your blue-colored 4mg  strength warfarin tablets by mouth, once-daily at 6PM each day--EXCEPT on MONDAYS, take ONLY 1/2 tablet on Mondays. Patient verbalized understanding of these instructions.     Patient advised to contact clinic or seek medical attention if signs/symptoms of bleeding or thromboembolism occur.  Patient verbalized understanding by repeating back information and was advised to contact me if further medication-related questions arise. Patient was also provided an information handout.  Follow-up Return in 4 weeks (on 06/13/2017) for Follow up INR at 1030h.  Caryl Bis PharmD, CACP, CPP  15 minutes spent face-to-face with the patient during the encounter. 50%  of time spent on education. 50% of time was spent on fingerstick point of care INR sample collection, processing, interpretation and data-entry into EPIC/CHL and [email or web address]

## 2017-05-16 NOTE — Patient Instructions (Signed)
Patient instructed to take medications as defined in the Anti-coagulation Track section of this encounter.  Patient instructed to take today's dose.  Patient instructed to take one (1) tablet of your blue-colored 4mg  strength warfarin tablets by mouth, once-daily at 6PM each day--EXCEPT on MONDAYS, take ONLY 1/2 tablet on Mondays. Patient verbalized understanding of these instructions.

## 2017-05-23 NOTE — Progress Notes (Signed)
I reviewed Dr. Gladstone Pih note.  Patient is on [facility] for PAD and thrombosis.  INR high side of goal.  Coumadin decreased.

## 2017-06-06 ENCOUNTER — Other Ambulatory Visit: Payer: Self-pay

## 2017-06-13 ENCOUNTER — Ambulatory Visit (INDEPENDENT_AMBULATORY_CARE_PROVIDER_SITE_OTHER): Payer: Medicare Other | Admitting: Pharmacist

## 2017-06-13 ENCOUNTER — Ambulatory Visit: Payer: Self-pay

## 2017-06-13 DIAGNOSIS — I779 Disorder of arteries and arterioles, unspecified: Secondary | ICD-10-CM

## 2017-06-13 DIAGNOSIS — T82868S Thrombosis of vascular prosthetic devices, implants and grafts, sequela: Secondary | ICD-10-CM | POA: Diagnosis present

## 2017-06-13 DIAGNOSIS — Z7901 Long term (current) use of anticoagulants: Secondary | ICD-10-CM | POA: Diagnosis not present

## 2017-06-13 LAB — POCT INR: INR: 2.1
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
## 2017-06-13 NOTE — Patient Instructions (Signed)
Patient instructed to take medications as defined in the Anti-coagulation Track section of this encounter.  Patient instructed to take today's dose.  Patient instructed to take one (1) tablet of your blue-colored 4mg  strength warfarin tablets by mouth, once-daily at 6PM each day--EXCEPT on MONDAYS, take 1&1/2 tablets on Mondays.    Patient verbalized understanding of these instructions.

## 2017-06-20 ENCOUNTER — Other Ambulatory Visit: Payer: Self-pay | Admitting: Pharmacist

## 2017-06-20 DIAGNOSIS — T82868S Thrombosis of vascular prosthetic devices, implants and grafts, sequela: Secondary | ICD-10-CM

## 2017-06-20 MED ORDER — WARFARIN SODIUM 4 MG PO TABS: ORAL_TABLET | ORAL | 2 refills | Status: DC

## 2017-07-11 ENCOUNTER — Ambulatory Visit (INDEPENDENT_AMBULATORY_CARE_PROVIDER_SITE_OTHER): Payer: Medicare Other | Admitting: Pharmacist

## 2017-07-11 DIAGNOSIS — Z7901 Long term (current) use of anticoagulants: Secondary | ICD-10-CM

## 2017-07-11 DIAGNOSIS — T82868S Thrombosis of vascular prosthetic devices, implants and grafts, sequela: Secondary | ICD-10-CM

## 2017-07-11 DIAGNOSIS — I779 Disorder of arteries and arterioles, unspecified: Secondary | ICD-10-CM | POA: Diagnosis not present

## 2017-07-11 LAB — POCT INR: INR: 2
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
## 2017-07-11 NOTE — Patient Instructions (Signed)
Patient instructed to take medications as defined in the Anti-coagulation Track section of this encounter.  Patient instructed to take today's dose.  Patient instructed to take  one (1) tablet of your blue-colored 4mg  strength warfarin tablets by mouth, once-daily at 6PM each day--EXCEPT on MONDAYS, WEDNESDAYS and FRIDAYS, take 1&1/2 tablets on Mondays.   Patient verbalized understanding of these instructions.

## 2017-07-12 NOTE — Progress Notes (Signed)
INTERNAL MEDICINE TEACHING ATTENDING ADDENDUM - Endrit Gittins M.D  Duration- indefinite, Indication- vascular graft thrombosis, INR- therapeutic. Agree with pharmacy recommendations as outlined in their note.     

## 2017-08-08 ENCOUNTER — Ambulatory Visit (INDEPENDENT_AMBULATORY_CARE_PROVIDER_SITE_OTHER): Payer: Medicare Other | Admitting: Pharmacist

## 2017-08-08 ENCOUNTER — Ambulatory Visit (INDEPENDENT_AMBULATORY_CARE_PROVIDER_SITE_OTHER): Payer: Medicare Other | Admitting: Internal Medicine

## 2017-08-08 VITALS — BP 130/73 | HR 58 | Temp 97.7°F | Ht 64.0 in | Wt 112.5 lb

## 2017-08-08 DIAGNOSIS — G8929 Other chronic pain: Secondary | ICD-10-CM | POA: Diagnosis not present

## 2017-08-08 DIAGNOSIS — I779 Disorder of arteries and arterioles, unspecified: Secondary | ICD-10-CM | POA: Diagnosis not present

## 2017-08-08 DIAGNOSIS — M545 Low back pain: Secondary | ICD-10-CM | POA: Diagnosis not present

## 2017-08-08 DIAGNOSIS — R05 Cough: Secondary | ICD-10-CM | POA: Diagnosis not present

## 2017-08-08 DIAGNOSIS — Z7901 Long term (current) use of anticoagulants: Secondary | ICD-10-CM | POA: Diagnosis not present

## 2017-08-08 DIAGNOSIS — R059 Cough, unspecified: Secondary | ICD-10-CM

## 2017-08-08 DIAGNOSIS — F17210 Nicotine dependence, cigarettes, uncomplicated: Secondary | ICD-10-CM

## 2017-08-08 DIAGNOSIS — Z23 Encounter for immunization: Secondary | ICD-10-CM | POA: Diagnosis present

## 2017-08-08 DIAGNOSIS — Z79891 Long term (current) use of opiate analgesic: Secondary | ICD-10-CM | POA: Diagnosis not present

## 2017-08-08 DIAGNOSIS — J449 Chronic obstructive pulmonary disease, unspecified: Secondary | ICD-10-CM

## 2017-08-08 DIAGNOSIS — T82868S Thrombosis of vascular prosthetic devices, implants and grafts, sequela: Secondary | ICD-10-CM

## 2017-08-08 DIAGNOSIS — R093 Abnormal sputum: Secondary | ICD-10-CM

## 2017-08-08 LAB — POCT INR: INR: 4
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
## 2017-08-08 NOTE — Assessment & Plan Note (Signed)
Received flu vaccination 08/08/2017.

## 2017-08-08 NOTE — Assessment & Plan Note (Addendum)
Patient presenting with acute productive cough, which started five days ago and is already improving. With the patient's associated symptoms this is most likely due to a viral upper respiratory infection. She has no shortness of breath and her lungs are clear to auscultation bilaterally, she is afebrile and sating 100% on RA. The patient has a history of mild COPD, but her symptoms are not consistent with a COPD exacerbation as she is improving without intervention and has no shortness of breath.   Plan: -Advised the patient she can use supportive measures over the counter such as robitussin or mucinex if needed -No other intervention needed at this time

## 2017-08-08 NOTE — Assessment & Plan Note (Addendum)
Patient requesting Percocet for stable, chronic low back pain. She states she has not had any for the past several weeks. She denies withdrawal symptoms. The patient has not had a follow up appointment with Dr. Eppie Gibson since 10/2016. [place] controlled database checked and no red flags.   Plan: -60 tablets Percocet prescribed -Advised patient to follow up with Dr. Eppie Gibson within 1 month to discuss pain medications  -If patient returns to [facility] without follow up with Dr. Eppie Gibson do not prescribe Percocet.
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
## 2017-08-08 NOTE — Patient Instructions (Signed)
Patient instructed to take medications as defined in the Anti-coagulation Track section of this encounter.  Patient instructed to take today's dose.  Patient instructed to take one (1) tablet of your blue-colored 4mg  strength warfarin tablets by mouth, once-daily at 6PM each day--EXCEPT on [facility], take 1&1/2 tablets on Wednesdays.   Patient verbalized understanding of these instructions.

## 2017-08-08 NOTE — Patient Instructions (Signed)
Ms. Donley,   I am happy your are feeling better and that your cough is resolving. You most likely had a viral upper respiratory infection, which is improving without any medications.   If your cough worsens you can take Robitussin or an over the counter cough suppressant.   I have written you a month supply of Percocet. Please make an appointment to see Dr. Eppie Gibson within a month to further discuss your pain medication needs.

## 2017-08-16 NOTE — Progress Notes (Signed)
Internal Medicine Clinic Attending  I saw and evaluated the patient.  I personally confirmed the key portions of the history and exam documented by Dr. LaCroce and I reviewed pertinent patient test results.  The assessment, diagnosis, and plan were formulated together and I agree with the documentation in the resident's note.  

## 2017-08-16 NOTE — Progress Notes (Signed)
INTERNAL MEDICINE TEACHING ATTENDING ADDENDUM - Myanna Ziesmer M.D  Duration- indefinite, Indication- recurrent fem pop graft thrombosis, INR- supratherapeutic. Agree with pharmacy recommendations as outlined in their note.

## 2017-09-05 ENCOUNTER — Ambulatory Visit: Payer: Self-pay

## 2017-09-12 ENCOUNTER — Ambulatory Visit (INDEPENDENT_AMBULATORY_CARE_PROVIDER_SITE_OTHER): Payer: Medicare Other | Admitting: Pharmacist

## 2017-09-12 DIAGNOSIS — Z7901 Long term (current) use of anticoagulants: Secondary | ICD-10-CM

## 2017-09-12 DIAGNOSIS — I779 Disorder of arteries and arterioles, unspecified: Secondary | ICD-10-CM | POA: Diagnosis not present

## 2017-09-12 DIAGNOSIS — I739 Peripheral vascular disease, unspecified: Secondary | ICD-10-CM

## 2017-09-12 DIAGNOSIS — T82868S Thrombosis of vascular prosthetic devices, implants and grafts, sequela: Secondary | ICD-10-CM

## 2017-09-12 LAB — POCT INR: INR: 1.8

## 2017-09-12 MED ORDER — WARFARIN SODIUM 4 MG PO TABS: ORAL_TABLET | ORAL | 2 refills | Status: DC

## 2017-09-12 NOTE — Patient Instructions (Signed)
Patient instructed to take medications as defined in the Anti-coagulation Track section of this encounter.  Patient instructed to take today's dose.  Patient instructed to take one (1) tablet of your blue-colored 4mg  strength warfarin tablets by mouth, once-daily at 6PM each day--EXCEPT on  MONDAYS and [facility], take 1&1/2 tablets on Mondays and  Wednesdays Patient verbalized understanding of these instructions.
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
## 2017-09-12 NOTE — Addendum Note (Signed)
Addended by: Jorene Guest B on: 09/12/2017 10:55 AM   Modules accepted: Level of Service

## 2017-09-22 NOTE — Progress Notes (Signed)
Indication: Recurrent vascular graft thrombosis. Duration: Indefinite. INR: Below target. Agree with Dr. Gladstone Pih assessment and plan.

## 2017-09-30 ENCOUNTER — Ambulatory Visit (INDEPENDENT_AMBULATORY_CARE_PROVIDER_SITE_OTHER): Payer: Medicare Other | Admitting: Internal Medicine

## 2017-09-30 ENCOUNTER — Encounter: Payer: Self-pay | Admitting: Internal Medicine

## 2017-09-30 VITALS — BP 151/86 | HR 78 | Temp 98.1°F | Wt 113.9 lb

## 2017-09-30 DIAGNOSIS — E876 Hypokalemia: Secondary | ICD-10-CM

## 2017-09-30 DIAGNOSIS — F331 Major depressive disorder, recurrent, moderate: Secondary | ICD-10-CM

## 2017-09-30 DIAGNOSIS — G8929 Other chronic pain: Secondary | ICD-10-CM | POA: Diagnosis not present

## 2017-09-30 DIAGNOSIS — M545 Low back pain: Secondary | ICD-10-CM | POA: Diagnosis not present

## 2017-09-30 DIAGNOSIS — Z79891 Long term (current) use of opiate analgesic: Secondary | ICD-10-CM | POA: Diagnosis not present

## 2017-09-30 DIAGNOSIS — J449 Chronic obstructive pulmonary disease, unspecified: Secondary | ICD-10-CM | POA: Diagnosis not present

## 2017-09-30 DIAGNOSIS — Z86718 Personal history of other venous thrombosis and embolism: Secondary | ICD-10-CM | POA: Diagnosis not present

## 2017-09-30 DIAGNOSIS — Z79899 Other long term (current) drug therapy: Secondary | ICD-10-CM | POA: Diagnosis not present

## 2017-09-30 DIAGNOSIS — I10 Essential (primary) hypertension: Secondary | ICD-10-CM

## 2017-09-30 DIAGNOSIS — T82868S Thrombosis of vascular prosthetic devices, implants and grafts, sequela: Secondary | ICD-10-CM

## 2017-09-30 DIAGNOSIS — Z720 Tobacco use: Secondary | ICD-10-CM

## 2017-09-30 DIAGNOSIS — F17210 Nicotine dependence, cigarettes, uncomplicated: Secondary | ICD-10-CM | POA: Diagnosis not present

## 2017-09-30 DIAGNOSIS — M810 Age-related osteoporosis without current pathological fracture: Secondary | ICD-10-CM

## 2017-09-30 DIAGNOSIS — I739 Peripheral vascular disease, unspecified: Secondary | ICD-10-CM

## 2017-09-30 DIAGNOSIS — Z7901 Long term (current) use of anticoagulants: Secondary | ICD-10-CM | POA: Diagnosis not present

## 2017-09-30 DIAGNOSIS — I779 Disorder of arteries and arterioles, unspecified: Secondary | ICD-10-CM

## 2017-09-30 DIAGNOSIS — F339 Major depressive disorder, recurrent, unspecified: Secondary | ICD-10-CM | POA: Diagnosis not present

## 2017-09-30 MED ORDER — OXYCODONE-ACETAMINOPHEN 7.5-325 MG PO TABS: 1.0000 | ORAL_TABLET | Freq: Three times a day (TID) | ORAL | 0 refills | Status: DC | PRN

## 2017-09-30 MED ORDER — LOVASTATIN 20 MG PO TABS: 20.0000 mg | ORAL_TABLET | Freq: Every day | ORAL | 3 refills | Status: DC

## 2017-09-30 NOTE — Patient Instructions (Addendum)
It was great to see you again!  You are doing well taking care of yourself.  1) Keep taking the medications as you are.  If your blood pressure remains high I will adjust your medication at the next visit.  2) We checked some blood work today.  I will call you next week with the results.  3) I gave you 3 oxycodone prescriptions today to get you through mid-February.  4) We gave you some information on the quit line which may be able to provide you with the nicotine patches.  I will see you in 6 months, sooner if necessary.

## 2017-09-30 NOTE — Assessment & Plan Note (Signed)
Assessment  She denies any claudication symptoms.  Plan  We will reassess for evidence of symptomatic peripheral vascular occlusive disease at the follow-up visit. In the meantime, we will continue to work on cardiovascular risk factors including her tobacco abuse and hypertension.

## 2017-09-30 NOTE — Progress Notes (Signed)
   Subjective:    Patient ID: Kristin Knight, female    DOB: 1946-06-12, 71 y.o.   MRN: [id number]  HPI  Kristin Knight is here for follow-up of her major depression, essential hypertension, peripheral vascular disease, tobacco abuse, vascular graft thrombosis, and mild chronic obstructive pulmonary disease. Please see the A&P for the status of the pt's chronic medical problems.  Review of Systems  Constitutional: Negative for activity change, appetite change and unexpected weight change.  Respiratory: Negative for cough, chest tightness, shortness of breath and wheezing.   Cardiovascular: Negative for chest pain, palpitations and leg swelling.  Gastrointestinal: Negative for abdominal distention, abdominal pain, constipation, diarrhea, nausea and vomiting.  Genitourinary: Negative for difficulty urinating.  Musculoskeletal: Positive for back pain. Negative for joint swelling and myalgias.  Skin: Negative for rash and wound.      Objective:   Physical Exam  Constitutional: She is oriented to person, place, and time. She appears well-developed and well-nourished. No distress.  HENT:  Head: Normocephalic and atraumatic.  Eyes: Conjunctivae are normal. Right eye exhibits no discharge. Left eye exhibits no discharge. No scleral icterus.  Cardiovascular: Normal rate, regular rhythm and normal heart sounds. Exam reveals no gallop and no friction rub.  No murmur heard. Pulmonary/Chest: Breath sounds normal. No respiratory distress. She has no wheezes. She has no rales.  Abdominal: Soft. Bowel sounds are normal. She exhibits no distension. There is no tenderness. There is no rebound and no guarding.  Musculoskeletal: Normal range of motion. She exhibits no edema, tenderness or deformity.  Neurological: She is alert and oriented to person, place, and time. She exhibits normal muscle tone.  Skin: Skin is warm and dry. No rash noted. She is not diaphoretic.  Psychiatric: She has a normal mood and  affect. Her behavior is normal. Judgment and thought content normal.  Nursing note and vitals reviewed.     Assessment & Plan:   Please see problem oriented charting.

## 2017-09-30 NOTE — Assessment & Plan Note (Signed)
Assessment  Her mild chronic obstructive pulmonary disease has been asymptomatic and not required as needed albuterol recently.  Plan  We will continue the as needed albuterol should she have increased symptoms from her mild chronic obstructive pulmonary disease. We will reassess the efficacy of this therapy in controlling her symptoms at the follow-up visit.

## 2017-09-30 NOTE — Assessment & Plan Note (Signed)
Assessment  She was unable to afford the bupropion therapy. Therefore, she continues to smoke. She remains interested in quitting, but is also unable to afford the nicotine patches.  Plan  She was given information on the quit line and asked to call. Periodically they are able to provide nicotine patches at a more affordable rate or free. We are hopeful that when she calls they have a supply of the nicotine patches for her. We will assess whether or not she was able to obtain the nicotine patches and therefore quit at the follow-up visit.

## 2017-09-30 NOTE — Assessment & Plan Note (Addendum)
Assessment  Her blood pressure today was elevated at 151/86. This is an isolated elevation in her blood pressure is usually well controlled on her chronic regimen. She states she has been compliant with this therapy.  Plan  We will continue the lisinopril 20 mg by mouth daily and Maxide 25-37.5 mg 1 tablet by mouth daily. A basic metabolic panel was obtained during this visit and is pending at the time of this dictation.

## 2017-09-30 NOTE — Assessment & Plan Note (Signed)
Assessment  Kristin Knight is happy with her mirtazapine with regards to her depressive symptoms and appetite. She would like to continue the regimen as is.  Plan  We will continue the mirtazapine 15 mg by mouth at bedtime and reassess her symptoms at the follow-up visit.

## 2017-09-30 NOTE — Assessment & Plan Note (Addendum)
Assessment  Her back pain remains problematic and does prevent her from work if she does not have her oxycodone-acetaminophen 7.5-325 mg 1 tablet every 6 hours as needed dispense #60 per month. When her pain is severe she notes improvement with one tablet and this does allow her to work in [employer].  Plan  Given the efficacy of this therapy in allowing her to maintain gainful employment, as well as enjoy a higher quality of life with less pain, we will continue the oxycodone-acetaminophen 7.5-325 mg 1 tablet every 6 hours as needed for back pain dispense #30 per month. We will reassess the efficacy of this therapy at maintaining her functional status at the follow-up visit. She was given 3 prescriptions that should get her to mid February.

## 2017-09-30 NOTE — Assessment & Plan Note (Signed)
Assessment  She's been compliant with her warfarin therapy and following up regularly with the anticoagulation clinic. She has had no signs or symptoms suggesting recurrence of her vascular grasp thrombosis.  Plan  We will continue the warfarin at the current dose and following her INR in the anticoagulation clinic.

## 2017-09-30 NOTE — Assessment & Plan Note (Signed)
She is currently asking to defer the hepatitis C assessment as she is unable to afford this lab test. She is otherwise up-to-date on her health care maintenance.

## 2017-09-30 NOTE — Assessment & Plan Note (Signed)
Assessment  She has not been interested in continuing with bisphosphonate therapy, but has been compliant with her calcium and vitamin D supplementation. She has no signs or symptoms suggestive of an osteoporotic fracture at this time.  Plan  We will continue to supplementation with vitamin D and calcium. We will reassess her compliance with this therapy and evaluate for signs or symptoms suggestive of an osteoporotic fracture at the follow-up visit.

## 2017-10-01 LAB — BMP8+ANION GAP
ANION GAP: 17 mmol/L (ref 10.0–18.0)
BUN/Creatinine Ratio: 18 (ref 12–28)
BUN: 16 mg/dL (ref 8–27)
CALCIUM: 9.3 mg/dL (ref 8.7–10.3)
CO2: 25 mmol/L (ref 20–29)
CREATININE: 0.9 mg/dL (ref 0.57–1.00)
Chloride: 97 mmol/L (ref 96–106)
GFR calc Af Amer: 75 mL/min/{1.73_m2} (ref >59)
GFR, EST NON AFRICAN AMERICAN: 65 mL/min/{1.73_m2} (ref >59)
Glucose: 113 mg/dL — ABNORMAL HIGH (ref 65–99)
POTASSIUM: 3.1 mmol/L — AB (ref 3.5–5.2)
SODIUM: 139 mmol/L (ref 134–144)

## 2017-10-04 MED ORDER — POTASSIUM CHLORIDE ER 10 MEQ PO TBCR: 10.0000 meq | EXTENDED_RELEASE_TABLET | Freq: Every day | ORAL | 3 refills | Status: DC

## 2017-10-04 NOTE — Addendum Note (Signed)
Addended by: Oval Linsey D on: 10/04/2017 04:42 PM   Modules accepted: Orders

## 2017-10-04 NOTE — Progress Notes (Signed)
Patient ID: Kristin Knight, female   DOB: 1946/03/13, 71 y.o.   MRN: [id number]  BMP K 3.1, eGFR 75  Hypokalemia likely secondary to diuretics.  She is somewhat limited on her greens because of her warfarin therapy.  Some other potassium rich foods do not necessarily appeal to her.  She is therefore interested in daily potassium supplementation.  Will start KCl 20 mEq PO daily and recheck the potassium at the follow-up visit.

## 2017-10-10 ENCOUNTER — Ambulatory Visit: Payer: Self-pay

## 2017-11-21 ENCOUNTER — Ambulatory Visit (INDEPENDENT_AMBULATORY_CARE_PROVIDER_SITE_OTHER): Payer: Medicare HMO | Admitting: Pharmacist

## 2017-11-21 DIAGNOSIS — T82868D Thrombosis of vascular prosthetic devices, implants and grafts, subsequent encounter: Secondary | ICD-10-CM

## 2017-11-21 DIAGNOSIS — Z7901 Long term (current) use of anticoagulants: Secondary | ICD-10-CM | POA: Diagnosis not present

## 2017-11-21 DIAGNOSIS — I779 Disorder of arteries and arterioles, unspecified: Secondary | ICD-10-CM

## 2017-11-21 LAB — POCT INR: INR: 2.9
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
## 2017-11-21 NOTE — Patient Instructions (Signed)
Patient instructed to take medications as defined in the Anti-coagulation Track section of this encounter.  Patient instructed to OMIT today's dose.  Patient instructed to commence on Tuesday 8-JAN-19 and take one (1) tablet of your blue-colored 4mg  strength warfarin tablets by mouth, once-daily at [facility] each day. Patient verbalized understanding of these instructions.

## 2017-11-21 NOTE — Progress Notes (Signed)
INTERNAL MEDICINE TEACHING ATTENDING ADDENDUM - Hilton Saephan M.D  Duration- life long, Indication- left fem pop bypass *3, INR- therapeutic. Agree with pharmacy recommendations as outlined in their note.

## 2017-11-29 ENCOUNTER — Other Ambulatory Visit: Payer: Self-pay | Admitting: Internal Medicine

## 2017-11-29 DIAGNOSIS — I10 Essential (primary) hypertension: Secondary | ICD-10-CM

## 2017-12-12 ENCOUNTER — Ambulatory Visit: Payer: Self-pay

## 2017-12-27 ENCOUNTER — Other Ambulatory Visit: Payer: Self-pay | Admitting: Internal Medicine

## 2017-12-27 DIAGNOSIS — T82868S Thrombosis of vascular prosthetic devices, implants and grafts, sequela: Secondary | ICD-10-CM

## 2017-12-27 NOTE — Telephone Encounter (Signed)
warfarin (COUMADIN) 4 MG tablet, refill request @ [facility] [address]

## 2018-01-02 ENCOUNTER — Ambulatory Visit (INDEPENDENT_AMBULATORY_CARE_PROVIDER_SITE_OTHER): Payer: Medicare HMO | Admitting: Pharmacist

## 2018-01-02 DIAGNOSIS — T82868S Thrombosis of vascular prosthetic devices, implants and grafts, sequela: Secondary | ICD-10-CM

## 2018-01-02 DIAGNOSIS — I779 Disorder of arteries and arterioles, unspecified: Secondary | ICD-10-CM

## 2018-01-02 DIAGNOSIS — Z7901 Long term (current) use of anticoagulants: Secondary | ICD-10-CM | POA: Diagnosis not present

## 2018-01-02 LAB — POCT INR: INR: 2.9

## 2018-01-02 NOTE — Patient Instructions (Signed)
Patient instructed to take medications as defined in the Anti-coagulation Track section of this encounter.  Patient instructed to OMIT today's dose.  Patient instructed to OMIT today's dose; Starting Tuesday, February 19, begin taking one  (1) tablet of your blue-colored 4mg  strength warfarin tablets by mouth, once-daily at [facility] each day--EXCEPT on Mondays and Thursdays--take ONLY 1/2 tablets on Mondays and Thursdays.   Patient verbalized understanding of these instructions.
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
## 2018-01-05 NOTE — Progress Notes (Signed)
INTERNAL MEDICINE TEACHING ATTENDING ADDENDUM - Kristin Groves, DO Duration- indefinate, Indication- recurrent vascular graft thrombosis, INR-  Lab Results  Component Value Date   INR 2.90 01/02/2018  . Agree with pharmacy recommendations as outlined in their note.

## 2018-01-13 ENCOUNTER — Other Ambulatory Visit: Payer: Self-pay

## 2018-01-13 DIAGNOSIS — M545 Low back pain, unspecified: Secondary | ICD-10-CM

## 2018-01-13 DIAGNOSIS — G8929 Other chronic pain: Secondary | ICD-10-CM

## 2018-01-13 MED ORDER — OXYCODONE-ACETAMINOPHEN 7.5-325 MG PO TABS: 1.0000 | ORAL_TABLET | Freq: Three times a day (TID) | ORAL | 0 refills | Status: DC | PRN

## 2018-01-13 NOTE — Telephone Encounter (Signed)
oxyCODONE-acetaminophen (PERCOCET) 7.5-325 MG tablet, refill request.

## 2018-01-13 NOTE — Telephone Encounter (Signed)
Last rx written 11/66/18. Last OV 09/30/17. Next OV scheduled first available on 02/27/18. UDS 02/21/17.

## 2018-01-30 ENCOUNTER — Ambulatory Visit: Payer: Self-pay

## 2018-02-17 ENCOUNTER — Ambulatory Visit (INDEPENDENT_AMBULATORY_CARE_PROVIDER_SITE_OTHER): Payer: Medicare HMO | Admitting: Internal Medicine

## 2018-02-17 ENCOUNTER — Encounter: Payer: Self-pay | Admitting: Internal Medicine

## 2018-02-17 ENCOUNTER — Other Ambulatory Visit: Payer: Self-pay

## 2018-02-17 ENCOUNTER — Ambulatory Visit (HOSPITAL_COMMUNITY)
Admission: RE | Admit: 2018-02-17 | Discharge: 2018-02-17 | Disposition: A | Discharge status: Home / Self Care | Payer: Medicare HMO | Source: Ambulatory Visit | Attending: Internal Medicine | Admitting: Internal Medicine

## 2018-02-17 VITALS — BP 120/75 | HR 72 | Temp 98.1°F | Ht 64.0 in | Wt 101.7 lb

## 2018-02-17 DIAGNOSIS — J302 Other seasonal allergic rhinitis: Secondary | ICD-10-CM

## 2018-02-17 DIAGNOSIS — J301 Allergic rhinitis due to pollen: Secondary | ICD-10-CM | POA: Diagnosis not present

## 2018-02-17 DIAGNOSIS — F1190 Opioid use, unspecified, uncomplicated: Secondary | ICD-10-CM

## 2018-02-17 DIAGNOSIS — Z87310 Personal history of (healed) osteoporosis fracture: Secondary | ICD-10-CM | POA: Diagnosis not present

## 2018-02-17 DIAGNOSIS — G8929 Other chronic pain: Secondary | ICD-10-CM

## 2018-02-17 DIAGNOSIS — M810 Age-related osteoporosis without current pathological fracture: Secondary | ICD-10-CM | POA: Diagnosis present

## 2018-02-17 DIAGNOSIS — Z681 Body mass index (BMI) 19 or less, adult: Secondary | ICD-10-CM

## 2018-02-17 DIAGNOSIS — F17200 Nicotine dependence, unspecified, uncomplicated: Secondary | ICD-10-CM | POA: Diagnosis not present

## 2018-02-17 DIAGNOSIS — F331 Major depressive disorder, recurrent, moderate: Secondary | ICD-10-CM

## 2018-02-17 DIAGNOSIS — I10 Essential (primary) hypertension: Secondary | ICD-10-CM | POA: Diagnosis not present

## 2018-02-17 DIAGNOSIS — Z7983 Long term (current) use of bisphosphonates: Secondary | ICD-10-CM

## 2018-02-17 DIAGNOSIS — J449 Chronic obstructive pulmonary disease, unspecified: Secondary | ICD-10-CM

## 2018-02-17 DIAGNOSIS — I779 Disorder of arteries and arterioles, unspecified: Secondary | ICD-10-CM

## 2018-02-17 DIAGNOSIS — Z79899 Other long term (current) drug therapy: Secondary | ICD-10-CM

## 2018-02-17 DIAGNOSIS — M545 Low back pain: Secondary | ICD-10-CM

## 2018-02-17 DIAGNOSIS — M549 Dorsalgia, unspecified: Secondary | ICD-10-CM | POA: Diagnosis not present

## 2018-02-17 DIAGNOSIS — Z79891 Long term (current) use of opiate analgesic: Secondary | ICD-10-CM

## 2018-02-17 DIAGNOSIS — Z720 Tobacco use: Secondary | ICD-10-CM

## 2018-02-17 DIAGNOSIS — E441 Mild protein-calorie malnutrition: Secondary | ICD-10-CM | POA: Diagnosis not present

## 2018-02-17 HISTORY — DX: Mild protein-calorie malnutrition: E44.1

## 2018-02-17 MED ORDER — OXYCODONE-ACETAMINOPHEN 7.5-325 MG PO TABS: 1.0000 | ORAL_TABLET | ORAL | 0 refills | Status: DC | PRN

## 2018-02-17 MED ORDER — CALCIUM CITRATE-VITAMIN D 315-200 MG-UNIT PO TABS: 2.0000 | ORAL_TABLET | Freq: Two times a day (BID) | ORAL | 3 refills | Status: DC

## 2018-02-17 MED ORDER — ALENDRONATE SODIUM 70 MG PO TABS: 70.0000 mg | ORAL_TABLET | ORAL | 3 refills | Status: DC

## 2018-02-17 MED ORDER — MIRTAZAPINE 30 MG PO TABS
30.0000 mg | ORAL_TABLET | Freq: Every day | ORAL | 3 refills | Status: DC
Start: 2018-02-17 — End: 2019-05-14

## 2018-02-17 MED ORDER — ALBUTEROL SULFATE HFA 108 (90 BASE) MCG/ACT IN AERS: 2.0000 | INHALATION_SPRAY | Freq: Four times a day (QID) | RESPIRATORY_TRACT | 3 refills | Status: DC | PRN

## 2018-02-17 NOTE — Assessment & Plan Note (Addendum)
Assessment  At the last visit she was encouraged to call the tobacco quit line as they may be able to provide her with nicotine patches which she could not afford on her own.She was successful in obtaining the nicotine patches but then developed major depression after the breakup of her relationship.  Plan  We discussed the importance of tobacco cessation and how she would likely gain weight if she was successful. That said, we both felt that her major depression exacerbation did not bode well for a successful cessation attempt at this time. We therefore decided to defer an attempt at tobacco cessation until the follow-up visit, when hopefully her depression was better controlled with the increased mirtazapine dose.

## 2018-02-17 NOTE — Assessment & Plan Note (Signed)
Assessment  The concern I had during the visit of her worsening back pain being secondary to compression fractures refocused our attention on her history of osteoporosis. She states she has not been taking the calcium or vitamin D and had asked to stop the bisphosphonates in the past. With this increased back pain and the possibility that it represented compression fractures, the patient was interested in restarting the bisphosphonate and calcium and vitamin D as well as undergoing a DEXA scan to further assess the stability of her osteoporosis.  Plan  Alendronate 70 mg by mouth weekly was prescribed. Calcium and vitamin D was also represcribed. Finally, a DEXA scan was ordered to assess her bone density and allow comparison to the previous DEXA scan from 2015.

## 2018-02-17 NOTE — Assessment & Plan Note (Signed)
Assessment  She notes continued back pain that is now in the mid back and worsened with any work. Although it still remains responsive to the Percocet, subjectively the pain is more intense and the Percocet lasts for less long. She denies any trauma to the area. She also denies any fevers, shakes, chills, myalgias, headaches, jaw claudication, rashes, shortness of breath, or chest pain. Although I thought, initially, this was chronic musculoskeletal pain related to degenerative osteoarthritis or disc disease of the spine, I am concerned about the progressive nature in this woman with mild protein calorie malnutrition and early evidence of compression fractures on previous spine films dating back to 2005. She also has not been interested in taking bisphosphonates recently. Thus, osteoporotic compression fractures of the spine are at the top of my list as a possible etiology for this worsening pain. The patient is now interested in restarting bisphosphonate therapy. We also discussed the fact that the Percocet could be taken as frequently as every 4 hours. She had been strictly following what was on the bottle at every 8 hours.  Plan  We will continue the Percocet 7.5-325 mg 1 tablet every 4 hours as needed for pain dispense #60 per month.  She will take a tablet at the beginning of her shift and then during her lunch break. It is hoped that with this change in the dosing timing she will be able to get through her work day. If this is ineffective, we have the option at the follow-up visit, to increase the oxycodone component to 10 mg. We will reassess the need for such a change at the follow-up visit.  A thoracic spine x-ray was obtained during this visit and the official radiology read is pending at the time of this dictation. On my uneducated review of the film I do not see any obvious compression fractures of the thoracic spine. We will follow-up on the official radiology report once it has been signed off.

## 2018-02-17 NOTE — Assessment & Plan Note (Signed)
Assessment  She denied any symptoms consistent with claudication.  Plan  We will continue aggressive therapy for her cardiovascular risk factors including controlling her blood pressure, and when appropriate, encouraging another try at smoking cessation.

## 2018-02-17 NOTE — Assessment & Plan Note (Signed)
Assessment  She has recently lost some weight and her BMI is now in the 17 range. The cause of this weight loss was secondary to decrease calorie intake from her decreased appetite related to her exacerbation of major depression with the breakup of her recent relationship. When I first met her, she was in a similar situation after the death of her husband. She responded exceptionally well to the mirtazapine. She is interested in more aggressive therapy for her depression and we both believe this will result in an increase in her weight.  Plan  She was continued on the mirtazapine but the dose was increased to 30 mg by mouth every night. We will reassess the efficacy of this therapy in controlling her depression, therefore improving her appetite, and thus resulting in an increase in her weight.

## 2018-02-17 NOTE — Assessment & Plan Note (Signed)
Assessment  She states she is sensing a little more chest tightness with the pollen in the air. She no longer has an albuterol inhaler available but asked that I prescribed her one.  Plan  An albuterol MDI 1-2 puffs every 6 hours as needed for shortness of breath was prescribed. We will follow up on the efficacy of this as needed therapy at the follow-up visit.  With regards to tobacco cessation, given her major depression exacerbation, we both felt this was not an appropriate time to make an attempt at cessation. This will be readdressed at the follow-up visit.

## 2018-02-17 NOTE — Assessment & Plan Note (Signed)
Assessment  She has been quite depressed recently, and this has resulted in a decrease in her appetite. She therefore has lost weight and her BMI is now in the 17 range. When asked what may have precipitated this she confided in me that she recently had a relationship, her first in the last 39 years since her husband died, that was initially going well but for some unexplained reason, broke off. The patient blamed herself for the breakup of the relationship and has been depressed ever since. That said, she remains on the mirtazapine 15 mg by mouth every night.  Plan  She was interested in increasing the dose of the mirtazapine since it worked so well in the past. We therefore raised the dose to 30 mg by mouth every evening. We also discussed how time lessens the blow of such a breakup and that I was very doubtful the cause of the breakup was solely on her end. We will reassess her symptoms of depression and decreased appetite at the follow-up visit on the higher dose of the mirtazapine.

## 2018-02-17 NOTE — Assessment & Plan Note (Signed)
Assessment  She noted some mild symptomatology with her seasonal allergies now that the pollen season had restarted. At this point, all she felt she required was a when necessary albuterol MDI in case she developed more shortness of breath.  Plan  She was prescribed an albuterol MDI to be used as needed should her allergies exacerbate her underlying mild chronic obstructive pulmonary disease. We will reassess the efficacy of this therapy at the follow-up visit.

## 2018-02-17 NOTE — Progress Notes (Signed)
   Subjective:    Patient ID: Kristin Knight, female    DOB: 1946/10/17, 72 y.o.   MRN: [id number]  Back Pain  Pertinent negatives include no abdominal pain, chest pain, fever, headaches, numbness or weakness.    Vali Capano is here for follow-up of her chronic back pain, osteoporosis, mild protein calorie malnutrition, major depression, essential hypertension, peripheral vascular occlusive disease, mild chronic obstructive pulmonary disease, seasonal allergies, and tobacco use disorder. Please see the A&P for the status of the pt's chronic medical problems.  Review of Systems  Constitutional: Positive for appetite change and unexpected weight change. Negative for activity change and fever.  HENT: Positive for postnasal drip.   Respiratory: Positive for chest tightness. Negative for cough.   Cardiovascular: Negative for chest pain, palpitations and leg swelling.  Gastrointestinal: Negative for abdominal distention, abdominal pain, constipation, diarrhea, nausea and vomiting.  Genitourinary: Negative for difficulty urinating.  Musculoskeletal: Positive for back pain.  Skin: Negative for rash.  Allergic/Immunologic: Positive for environmental allergies.  Neurological: Negative for syncope, weakness, light-headedness, numbness and headaches.  Psychiatric/Behavioral: Positive for decreased concentration and dysphoric mood.      Objective:   Physical Exam  Constitutional: She is oriented to person, place, and time. She appears well-developed. No distress.  HENT:  Head: Normocephalic and atraumatic.  Eyes: Conjunctivae are normal. Right eye exhibits no discharge. Left eye exhibits no discharge. No scleral icterus.  Musculoskeletal: Normal range of motion. She exhibits no edema, tenderness or deformity.  Neurological: She is alert and oriented to person, place, and time. She exhibits normal muscle tone. Coordination normal.  Skin: Skin is warm and dry. No rash noted. She is not diaphoretic.  No erythema.  Psychiatric: She has a normal mood and affect. Her behavior is normal. Judgment and thought content normal.  Nursing note and vitals reviewed.     Assessment & Plan:   Please see problem oriented charting.

## 2018-02-17 NOTE — Patient Instructions (Addendum)
It was good to see you.  I am sorry about your recent relationship.  Things will get better with time and some adjustments we have made.  1) I increased your mirtazapine to 30 mg at night.  Take 2 of your old tablets until you run out.  When you get the new bottle go back to just 1 tablet at night (it is a higher dose per tablet).  2) I restarted your alendronate 70 mg once a week.  This is to strengthen your bones.  3) I reordered your calcium and vitamin D for your bone strength.  4) I reordered your inhaler so you have it around if you get short of breath.  5) I ordered some blood work today to check your potassium and kidneys.  6) I ordered an X-ray of your back to see if wee can figure out why you are hurting so much.  7) I reordered your percocet pain medication.  Take the dose at 9 AM and 1 PM to see if this helps you get through the day at work.  If not, let me know and we can go to the high dose.  Call the clinic when you are going to run out in about 5 days since I can now send this to the pharmacy electronically.  8) We will start back working on your smoking when your depression is better.  9) I will see you back in 3 months, sooner if necessary.

## 2018-02-17 NOTE — Assessment & Plan Note (Signed)
Assessment  Her blood pressure today was excellent at 120/75.  This is on lisinopril 20 mg by mouth daily and triamterene-hydrochlorothiazide 37.5-25 mg 1 tablet by mouth daily.  Plan  We will continue the lisinopril and the triamterene-hydrochlorothiazide at the current doses. We will reassess blood pressure control at the follow-up visit.  A basic metabolic panel was drawn at this visit and is pending at the time of this dictation. Since she stopped her potassium chloride, if the potassium level is normal on this BMP, we will remove it from her medication list.

## 2018-02-18 LAB — BMP8+ANION GAP
Anion Gap: 15 mmol/L (ref 10.0–18.0)
BUN/Creatinine Ratio: 22 (ref 12–28)
BUN: 19 mg/dL (ref 8–27)
CALCIUM: 9.9 mg/dL (ref 8.7–10.3)
CO2: 27 mmol/L (ref 20–29)
Chloride: 98 mmol/L (ref 96–106)
Creatinine, Ser: 0.87 mg/dL (ref 0.57–1.00)
GFR, EST AFRICAN AMERICAN: 78 mL/min/{1.73_m2} (ref >59)
GFR, EST NON AFRICAN AMERICAN: 67 mL/min/{1.73_m2} (ref >59)
Glucose: 101 mg/dL — ABNORMAL HIGH (ref 65–99)
Potassium: 4.1 mmol/L (ref 3.5–5.2)
Sodium: 140 mmol/L (ref 134–144)

## 2018-02-21 NOTE — Progress Notes (Signed)
Patient ID: Kristin Knight, female   DOB: 1946/03/09, 72 y.o.   MRN: [id number]  BMP: K 4.1, Cr 0.87, eGFR 78  Called patient with results and received identified answering machine.  Left message that "Labs look fantastic, everything is good".  Will continue with current plan.

## 2018-02-23 LAB — TOXASSURE SELECT,+ANTIDEPR,UR

## 2018-02-24 NOTE — Progress Notes (Signed)
Patient ID: Kristin Knight, female   DOB: 1946/08/17, 72 y.o.   MRN: [id number]  Other than THC UDS was without red flags.

## 2018-03-06 ENCOUNTER — Ambulatory Visit (INDEPENDENT_AMBULATORY_CARE_PROVIDER_SITE_OTHER): Payer: Medicare HMO | Admitting: Pharmacist

## 2018-03-06 ENCOUNTER — Other Ambulatory Visit: Payer: Self-pay | Admitting: Internal Medicine

## 2018-03-06 DIAGNOSIS — F331 Major depressive disorder, recurrent, moderate: Secondary | ICD-10-CM

## 2018-03-06 DIAGNOSIS — T82868S Thrombosis of vascular prosthetic devices, implants and grafts, sequela: Secondary | ICD-10-CM

## 2018-03-06 DIAGNOSIS — Z7901 Long term (current) use of anticoagulants: Secondary | ICD-10-CM

## 2018-03-06 DIAGNOSIS — Z5181 Encounter for therapeutic drug level monitoring: Secondary | ICD-10-CM

## 2018-03-06 DIAGNOSIS — I779 Disorder of arteries and arterioles, unspecified: Secondary | ICD-10-CM | POA: Diagnosis not present

## 2018-03-06 DIAGNOSIS — I10 Essential (primary) hypertension: Secondary | ICD-10-CM

## 2018-03-06 LAB — POCT INR: INR: 2.1

## 2018-03-06 MED ORDER — WARFARIN SODIUM 4 MG PO TABS: 4.0000 mg | ORAL_TABLET | Freq: Every day | ORAL | 1 refills | Status: DC
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
## 2018-03-06 NOTE — Patient Instructions (Signed)
Patient instructed to take medications as defined in the Anti-coagulation Track section of this encounter.  Patient instructed to take today's dose.  Patient instructed to take one (1) tablet of your 4mg blue-colored warfarin tablets by mouth, once-daily, at 6PM each day. Patient verbalized understanding of these instructions.    

## 2018-03-07 NOTE — Progress Notes (Signed)
INTERNAL MEDICINE TEACHING ATTENDING ADDENDUM - Kristin Groves, DO Duration- indefinate, Indication- PAD, INR- theraputic Lab Results  Component Value Date   INR 2.10 03/06/2018  . Agree with pharmacy recommendations as outlined in their note.

## 2018-03-13 ENCOUNTER — Telehealth: Payer: Self-pay | Admitting: Pharmacist

## 2018-03-13 NOTE — Telephone Encounter (Signed)
Contacted patient to reschedule from 20-MAY-19 to 03-JUN-19 at 1445h.

## 2018-03-22 ENCOUNTER — Other Ambulatory Visit: Payer: Self-pay | Admitting: Internal Medicine

## 2018-03-22 DIAGNOSIS — M545 Low back pain: Principal | ICD-10-CM

## 2018-03-22 DIAGNOSIS — G8929 Other chronic pain: Secondary | ICD-10-CM

## 2018-03-22 NOTE — Telephone Encounter (Signed)
Refill Rquest   oxyCODONE-acetaminophen (PERCOCET) 7.5-325 MG tablet

## 2018-03-22 NOTE — Telephone Encounter (Signed)
Please call pharmacy and remind them they were sent three prescriptions electronically on February 17, 2018 with the last one not to be filled prior to April 18, 2018.  Thank you.

## 2018-03-24 NOTE — Telephone Encounter (Signed)
Have spoken to pharmacy and pt

## 2018-03-31 ENCOUNTER — Other Ambulatory Visit: Payer: Self-pay

## 2018-04-03 ENCOUNTER — Ambulatory Visit: Payer: Self-pay

## 2018-04-17 ENCOUNTER — Ambulatory Visit: Payer: Self-pay

## 2018-04-20 ENCOUNTER — Other Ambulatory Visit: Payer: Self-pay | Admitting: Internal Medicine

## 2018-04-20 DIAGNOSIS — G8929 Other chronic pain: Secondary | ICD-10-CM

## 2018-04-20 DIAGNOSIS — M545 Low back pain: Principal | ICD-10-CM

## 2018-04-20 MED ORDER — OXYCODONE-ACETAMINOPHEN 7.5-325 MG PO TABS: 1.0000 | ORAL_TABLET | Freq: Three times a day (TID) | ORAL | 0 refills | Status: DC | PRN

## 2018-04-20 NOTE — Telephone Encounter (Signed)
Patient is requesting refill on pain medicine °

## 2018-05-14 ENCOUNTER — Other Ambulatory Visit: Payer: Self-pay | Admitting: Pharmacist

## 2018-05-14 DIAGNOSIS — T82868S Thrombosis of vascular prosthetic devices, implants and grafts, sequela: Secondary | ICD-10-CM

## 2018-05-15 NOTE — Telephone Encounter (Signed)
Please schedule an appt with Dr Elie Confer.

## 2018-05-15 NOTE — Telephone Encounter (Signed)
Last ov with Dr Elie Confer 4/22.

## 2018-05-29 ENCOUNTER — Ambulatory Visit (INDEPENDENT_AMBULATORY_CARE_PROVIDER_SITE_OTHER): Payer: Medicare HMO | Admitting: Pharmacist

## 2018-05-29 DIAGNOSIS — T82868S Thrombosis of vascular prosthetic devices, implants and grafts, sequela: Secondary | ICD-10-CM | POA: Diagnosis not present

## 2018-05-29 DIAGNOSIS — I779 Disorder of arteries and arterioles, unspecified: Secondary | ICD-10-CM | POA: Diagnosis not present

## 2018-05-29 DIAGNOSIS — Z5181 Encounter for therapeutic drug level monitoring: Secondary | ICD-10-CM | POA: Diagnosis not present

## 2018-05-29 DIAGNOSIS — Z7901 Long term (current) use of anticoagulants: Secondary | ICD-10-CM | POA: Diagnosis not present

## 2018-05-29 LAB — POCT INR: INR: 2.4 (ref 2.0–3.0)

## 2018-05-29 NOTE — Progress Notes (Signed)
INTERNAL MEDICINE TEACHING ATTENDING ADDENDUM - Desare Duddy M.D  Duration- lifelong, Indication- recurrent fem pop graft thrombosis, INR- therapeutic. Agree with pharmacy recommendations as outlined in their note.

## 2018-05-29 NOTE — Patient Instructions (Signed)
Patient instructed to take medications as defined in the Anti-coagulation Track section of this encounter.  Patient instructed to take today's dose.  Patient instructed to take one (1) tablet of your 4mg  blue-colored warfarin tablets by mouth, once-daily, at [facility] each day. Patient verbalized understanding of these instructions.
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
## 2018-07-13 ENCOUNTER — Other Ambulatory Visit: Payer: Self-pay | Admitting: Internal Medicine

## 2018-07-13 DIAGNOSIS — T82868S Thrombosis of vascular prosthetic devices, implants and grafts, sequela: Secondary | ICD-10-CM

## 2018-07-14 NOTE — Telephone Encounter (Signed)
[facility] appt 9/9.

## 2018-07-16 ENCOUNTER — Encounter (HOSPITAL_COMMUNITY): Payer: Self-pay | Admitting: Emergency Medicine

## 2018-07-16 ENCOUNTER — Emergency Department (HOSPITAL_COMMUNITY)
Admission: EM | Admit: 2018-07-16 | Discharge: 2018-07-17 | Disposition: A | Discharge status: Home / Self Care | Payer: Medicare HMO | Attending: Emergency Medicine | Admitting: Emergency Medicine

## 2018-07-16 DIAGNOSIS — Z7901 Long term (current) use of anticoagulants: Secondary | ICD-10-CM | POA: Insufficient documentation

## 2018-07-16 DIAGNOSIS — M79605 Pain in left leg: Secondary | ICD-10-CM | POA: Insufficient documentation

## 2018-07-16 DIAGNOSIS — Z86718 Personal history of other venous thrombosis and embolism: Secondary | ICD-10-CM | POA: Diagnosis not present

## 2018-07-16 DIAGNOSIS — Z5321 Procedure and treatment not carried out due to patient leaving prior to being seen by health care provider: Secondary | ICD-10-CM | POA: Diagnosis not present

## 2018-07-16 NOTE — ED Triage Notes (Signed)
Pt reports L leg pain X 1 day. Pt states pain started while driving home from [place]. 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

## 2018-07-17 ENCOUNTER — Emergency Department (HOSPITAL_BASED_OUTPATIENT_CLINIC_OR_DEPARTMENT_OTHER): Payer: Medicare HMO

## 2018-07-17 ENCOUNTER — Encounter (HOSPITAL_COMMUNITY): Payer: Self-pay | Admitting: Emergency Medicine

## 2018-07-17 ENCOUNTER — Emergency Department (HOSPITAL_COMMUNITY)
Admission: EM | Admit: 2018-07-17 | Discharge: 2018-07-17 | Disposition: A | Discharge status: Home / Self Care | Payer: Medicare HMO | Source: Home / Self Care | Attending: Emergency Medicine | Admitting: Emergency Medicine

## 2018-07-17 ENCOUNTER — Other Ambulatory Visit: Payer: Self-pay

## 2018-07-17 DIAGNOSIS — Z79899 Other long term (current) drug therapy: Secondary | ICD-10-CM

## 2018-07-17 DIAGNOSIS — F17210 Nicotine dependence, cigarettes, uncomplicated: Secondary | ICD-10-CM

## 2018-07-17 DIAGNOSIS — Y658 Other specified misadventures during surgical and medical care: Secondary | ICD-10-CM

## 2018-07-17 DIAGNOSIS — T82898A Other specified complication of vascular prosthetic devices, implants and grafts, initial encounter: Secondary | ICD-10-CM

## 2018-07-17 DIAGNOSIS — M79609 Pain in unspecified limb: Secondary | ICD-10-CM | POA: Diagnosis not present

## 2018-07-17 DIAGNOSIS — Z7901 Long term (current) use of anticoagulants: Secondary | ICD-10-CM | POA: Insufficient documentation

## 2018-07-17 DIAGNOSIS — I10 Essential (primary) hypertension: Secondary | ICD-10-CM

## 2018-07-17 LAB — CBC WITH DIFFERENTIAL/PLATELET
Abs Immature Granulocytes: 0 10*3/uL (ref 0.0–0.1)
Basophils Absolute: 0 10*3/uL (ref 0.0–0.1)
Basophils Relative: 1 %
EOS ABS: 0.1 10*3/uL (ref 0.0–0.7)
EOS PCT: 2 %
HEMATOCRIT: 37.5 % (ref 36.0–46.0)
HEMOGLOBIN: 11.4 g/dL — AB (ref 12.0–15.0)
Immature Granulocytes: 0 %
LYMPHS ABS: 2.1 10*3/uL (ref 0.7–4.0)
LYMPHS PCT: 49 %
MCH: 28.9 pg (ref 26.0–34.0)
MCHC: 30.4 g/dL (ref 30.0–36.0)
MCV: 94.9 fL (ref 78.0–100.0)
Monocytes Absolute: 0.5 10*3/uL (ref 0.1–1.0)
Monocytes Relative: 10 %
Neutro Abs: 1.6 10*3/uL — ABNORMAL LOW (ref 1.7–7.7)
Neutrophils Relative %: 38 %
Platelets: 200 10*3/uL (ref 150–400)
RBC: 3.95 MIL/uL (ref 3.87–5.11)
RDW: 14.9 % (ref 11.5–15.5)
WBC: 4.3 10*3/uL (ref 4.0–10.5)

## 2018-07-17 LAB — PROTIME-INR
INR: 1.17
Prothrombin Time: 14.8 seconds (ref 11.4–15.2)

## 2018-07-17 LAB — COMPREHENSIVE METABOLIC PANEL
ALT: 12 U/L (ref 0–44)
ANION GAP: 9 (ref 5–15)
AST: 17 U/L (ref 15–41)
Albumin: 3.8 g/dL (ref 3.5–5.0)
Alkaline Phosphatase: 62 U/L (ref 38–126)
BILIRUBIN TOTAL: 0.7 mg/dL (ref 0.3–1.2)
BUN: 16 mg/dL (ref 8–23)
CHLORIDE: 100 mmol/L (ref 98–111)
CO2: 30 mmol/L (ref 22–32)
Calcium: 9.3 mg/dL (ref 8.9–10.3)
Creatinine, Ser: 0.99 mg/dL (ref 0.44–1.00)
GFR, EST NON AFRICAN AMERICAN: 56 mL/min — AB (ref >60)
Glucose, Bld: 98 mg/dL (ref 70–99)
Potassium: 3.6 mmol/L (ref 3.5–5.1)
Sodium: 139 mmol/L (ref 135–145)
TOTAL PROTEIN: 6.6 g/dL (ref 6.5–8.1)

## 2018-07-17 MED ORDER — WARFARIN - PHYSICIAN DOSING INPATIENT: Freq: Every day | Status: DC

## 2018-07-17 MED ORDER — WARFARIN SODIUM 4 MG PO TABS
4.0000 mg | ORAL_TABLET | Freq: Once | ORAL | Status: DC
  Filled 2018-07-17: qty 1

## 2018-07-17 MED ORDER — ENOXAPARIN SODIUM 30 MG/0.3ML ~~LOC~~ SOLN: 45.0000 mg | Freq: Two times a day (BID) | SUBCUTANEOUS | 0 refills | Status: DC

## 2018-07-17 MED ORDER — ENOXAPARIN SODIUM 60 MG/0.6ML ~~LOC~~ SOLN
1.0000 mg/kg | Freq: Two times a day (BID) | SUBCUTANEOUS | Status: DC
  Administered 2018-07-17: 45 mg via SUBCUTANEOUS
  Filled 2018-07-17: qty 0.45

## 2018-07-17 NOTE — ED Triage Notes (Signed)
Pain in left leg while leg since yesterday , big toe throbbing has hx of blood clot in leg, out of blood thinners x 3 days ,she states was riding  In car  To [place] when it started to hurt

## 2018-07-17 NOTE — ED Provider Notes (Signed)
Medical screening examination/treatment/procedure(s) were conducted as a shared visit with non-physician practitioner(s) and myself.  I personally evaluated the patient during the encounter.  Patient presents emergency room with complaints of lower extremity pain.  Patient symptoms are concerning for peripheral vascular disease.  Patient has a known history of peripheral vascular disease and unfortunately has not been on her anticoagulants for the last few days.  Her vascular study does show occlusion of her previous left previous vascular procedure.  On exam, no cyanosis, no temperature differential in her feet.  Will consult with vascular surgery, will need to start either lovenox or heparin depending on her disposition.   Dorie Rank, MD 07/17/18 [phone]
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
## 2018-07-17 NOTE — ED Notes (Signed)
Pt transported to vascular.  °

## 2018-07-17 NOTE — Discharge Instructions (Signed)
Thank you for allowing me to care for you today in the Emergency Department.   Dr. Oneida Alar or someone from the vascular surgery office should call you tomorrow to schedule you with a follow-up appointment for later this week.

## 2018-07-17 NOTE — ED Notes (Signed)
Pt let staff know that she is leaving because she no longer wants to wait. Staff recommended that she stay and be seen, pt said that she is going to have her daughter come pick her up.

## 2018-07-17 NOTE — ED Notes (Signed)
Patient Alert and oriented to baseline. Stable and ambulatory to baseline. Patient verbalized understanding of the discharge instructions.  Patient belongings were taken by the patient.   

## 2018-07-17 NOTE — ED Notes (Signed)
Pt returned from vascular, nad, pt ambulatory to the restroom
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
## 2018-07-17 NOTE — Progress Notes (Signed)
VASCULAR LAB PRELIMINARY  PRELIMINARY  PRELIMINARY  PRELIMINARY  Left lower extremity venous duplex completed.    Preliminary report:  There is no DVT or SVT noted in the left lower extremity.  Incidentally, the left fem-pop bypass graft is occluded.  Gave report to [place], RVT 07/17/2018, 1:59 PM

## 2018-07-18 ENCOUNTER — Telehealth: Payer: Self-pay

## 2018-07-18 ENCOUNTER — Encounter: Payer: Self-pay | Admitting: *Deleted

## 2018-07-18 ENCOUNTER — Telehealth: Payer: Self-pay | Admitting: *Deleted

## 2018-07-18 ENCOUNTER — Other Ambulatory Visit: Payer: Self-pay | Admitting: *Deleted

## 2018-07-18 ENCOUNTER — Telehealth: Payer: Self-pay | Admitting: Internal Medicine

## 2018-07-18 NOTE — Telephone Encounter (Signed)
NO NEW NOTE.

## 2018-07-18 NOTE — Progress Notes (Signed)
Spoke with Tamela Oddi at Internal Medicine at [facility] confirmed they are getting in touch with patient for anticoagulation management for patient . Will need to hold Coumadin for 5 days pre-procedure scheduled for 07/25/18.

## 2018-07-18 NOTE — Telephone Encounter (Signed)
   [facility] Pre-operative Risk Assessment    Request for surgical clearance:  1. What type of surgery is being performed? Aortogram possible Thrombolyis LLE  2. When is this surgery scheduled? 07/25/18   3. What type of clearance is required (medical clearance vs. Pharmacy clearance to hold med vs. Both)? Pharmacy  4. Are there any medications that need to be held prior to surgery and how long? Coumadin hold 5 days prior-bridging with Lovenox needed  5. Practice name and name of physician performing surgery? [facility]   6. What is your office phone number [phone]   7.   What is your office fax number [phone] Attn: Becky  8.   Anesthesia type (None, local, MAC, general) ? Unknown   Meryl Crutch 07/18/2018, 3:25 PM  _________________________________________________________________   (provider comments below)

## 2018-07-18 NOTE — Telephone Encounter (Signed)
Call to patient and instructed to be at [facility] admitting department at 9:30 am on 07/25/18 for procedure. NPO past MN except for Lisinopril and Triamterene/HCTZ am of 07/25/18 with sip of water. To hold Coumadin for 5 days pre-procedure expect a call from [facility] for instructions. (medication hold request sent to [facility]).  Plan to stay overnight. Verbalized understanding

## 2018-07-18 NOTE — Telephone Encounter (Signed)
Unable to reach patient by phone-stating not accepting calls.  I left a message at 9:45 AM for her daughter - Christella Hartigan to return the call to [phone] to schedule her mom for an appointment this PM if possible.

## 2018-07-18 NOTE — Telephone Encounter (Signed)
Pharmacy called related to Rx: lovenox directions...EDCM clarified with EDP ([place]) to change Rx to: .4 ml injection.

## 2018-07-19 ENCOUNTER — Ambulatory Visit (INDEPENDENT_AMBULATORY_CARE_PROVIDER_SITE_OTHER): Payer: Medicare HMO | Admitting: Pharmacist

## 2018-07-19 ENCOUNTER — Other Ambulatory Visit: Payer: Self-pay

## 2018-07-19 DIAGNOSIS — I779 Disorder of arteries and arterioles, unspecified: Secondary | ICD-10-CM | POA: Diagnosis not present

## 2018-07-19 DIAGNOSIS — T82868S Thrombosis of vascular prosthetic devices, implants and grafts, sequela: Secondary | ICD-10-CM

## 2018-07-19 DIAGNOSIS — Z5181 Encounter for therapeutic drug level monitoring: Secondary | ICD-10-CM

## 2018-07-19 DIAGNOSIS — Z7901 Long term (current) use of anticoagulants: Secondary | ICD-10-CM | POA: Diagnosis not present

## 2018-07-19 LAB — POCT INR: INR: 1.2 — AB (ref 2.0–3.0)

## 2018-07-19 MED ORDER — ENOXAPARIN SODIUM 40 MG/0.4ML ~~LOC~~ SOLN: 40.0000 mg | Freq: Two times a day (BID) | SUBCUTANEOUS | 1 refills | Status: DC

## 2018-07-19 MED FILL — ENOXAPARIN 40 MG/0.4 ML SYR: 40 | 5 days supply | Qty: 4 | Fill #0

## 2018-07-19 NOTE — Telephone Encounter (Signed)
oxyCODONE-acetaminophen (PERCOCET) 7.5-325 MG tablet, Refill request @ [facility] on [address].

## 2018-07-19 NOTE — Telephone Encounter (Signed)
Called pharm, pt has script on file, they will fill and call pt in about 2 hrs Pt was called and informed

## 2018-07-19 NOTE — Telephone Encounter (Signed)
Patient managed by Internal medicine for anticoagulation. Would recommend contacting them for clearance and lovenox bridging.
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
## 2018-07-19 NOTE — Progress Notes (Signed)
INTERNAL MEDICINE TEACHING ATTENDING ADDENDUM - Amandajo Gonder M.D  Duration- indefinite, Indication- recurrent vascular graft thrombosis, INR- sub therapeutic. Agree with pharmacy recommendations as outlined in their note.   Patient's oral anticoagulation is on hold pending a LE angiogram scheduled for September 10th and is currently being bridged with enoxaparin.

## 2018-07-19 NOTE — Telephone Encounter (Signed)
I will route this recommendation to the requesting party via Epic fax function and remove from pre-op pool.  Please call with questions.  [place], [place] 07/19/2018, 2:45 PM

## 2018-07-19 NOTE — Patient Instructions (Signed)
Patient instructed to take medications as defined in the Anti-coagulation Track section of this encounter.  Patient instructed to OMIT today's dose.  Patient's warfarin being HELD starting Wednesday 4-SEP-19 until planned procedure scheduled for Tuesday 10-SEP-19. During this time, patient will be on LMWH enoxaparin/Lovenox 1mg /kg SQ q12h. She has 2 syringes remaining from her original prescription by [facility]. We will send Rx to [facility] for 40mg  #10 Syringes:  Inject 40mg  every 12 hours x 5 days.  Patient verbalized understanding of these instructions.

## 2018-07-24 ENCOUNTER — Ambulatory Visit: Payer: Self-pay

## 2018-07-25 ENCOUNTER — Other Ambulatory Visit: Payer: Self-pay

## 2018-07-25 ENCOUNTER — Encounter (HOSPITAL_COMMUNITY)
Admission: AD | Disposition: A | Discharge status: Home / Self Care | Payer: Self-pay | Source: Home / Self Care | Attending: Surgery

## 2018-07-25 ENCOUNTER — Inpatient Hospital Stay (HOSPITAL_COMMUNITY)
Admission: AD | Admit: 2018-07-25 | Discharge: 2018-08-03 | DRG: 254 | Disposition: A | Discharge status: Home / Self Care | Payer: Medicare HMO | Attending: Surgery | Admitting: Surgery

## 2018-07-25 DIAGNOSIS — Z79891 Long term (current) use of opiate analgesic: Secondary | ICD-10-CM

## 2018-07-25 DIAGNOSIS — T82898A Other specified complication of vascular prosthetic devices, implants and grafts, initial encounter: Principal | ICD-10-CM | POA: Diagnosis present

## 2018-07-25 DIAGNOSIS — F17210 Nicotine dependence, cigarettes, uncomplicated: Secondary | ICD-10-CM | POA: Diagnosis present

## 2018-07-25 DIAGNOSIS — Z880 Allergy status to penicillin: Secondary | ICD-10-CM

## 2018-07-25 DIAGNOSIS — J449 Chronic obstructive pulmonary disease, unspecified: Secondary | ICD-10-CM | POA: Diagnosis present

## 2018-07-25 DIAGNOSIS — I739 Peripheral vascular disease, unspecified: Secondary | ICD-10-CM | POA: Diagnosis present

## 2018-07-25 DIAGNOSIS — Z79899 Other long term (current) drug therapy: Secondary | ICD-10-CM | POA: Diagnosis not present

## 2018-07-25 DIAGNOSIS — Z7983 Long term (current) use of bisphosphonates: Secondary | ICD-10-CM | POA: Diagnosis not present

## 2018-07-25 DIAGNOSIS — I129 Hypertensive chronic kidney disease with stage 1 through stage 4 chronic kidney disease, or unspecified chronic kidney disease: Secondary | ICD-10-CM | POA: Diagnosis present

## 2018-07-25 DIAGNOSIS — Z888 Allergy status to other drugs, medicaments and biological substances status: Secondary | ICD-10-CM

## 2018-07-25 DIAGNOSIS — Z7901 Long term (current) use of anticoagulants: Secondary | ICD-10-CM

## 2018-07-25 DIAGNOSIS — Z8249 Family history of ischemic heart disease and other diseases of the circulatory system: Secondary | ICD-10-CM

## 2018-07-25 DIAGNOSIS — J302 Other seasonal allergic rhinitis: Secondary | ICD-10-CM | POA: Diagnosis present

## 2018-07-25 DIAGNOSIS — M810 Age-related osteoporosis without current pathological fracture: Secondary | ICD-10-CM | POA: Diagnosis present

## 2018-07-25 DIAGNOSIS — N189 Chronic kidney disease, unspecified: Secondary | ICD-10-CM | POA: Diagnosis present

## 2018-07-25 DIAGNOSIS — Y832 Surgical operation with anastomosis, bypass or graft as the cause of abnormal reaction of the patient, or of later complication, without mention of misadventure at the time of the procedure: Secondary | ICD-10-CM | POA: Diagnosis present

## 2018-07-25 HISTORY — PX: ABDOMINAL AORTOGRAM W/LOWER EXTREMITY: CATH118223

## 2018-07-25 LAB — CBC
HCT: 36.5 % (ref 36.0–46.0)
Hemoglobin: 11.2 g/dL — ABNORMAL LOW (ref 12.0–15.0)
MCH: 28.9 pg (ref 26.0–34.0)
MCHC: 30.7 g/dL (ref 30.0–36.0)
MCV: 94.3 fL (ref 78.0–100.0)
PLATELETS: 165 10*3/uL (ref 150–400)
RBC: 3.87 MIL/uL (ref 3.87–5.11)
RDW: 14.8 % (ref 11.5–15.5)
WBC: 6.3 10*3/uL (ref 4.0–10.5)

## 2018-07-25 LAB — POCT I-STAT, CHEM 8
BUN: 21 mg/dL (ref 8–23)
CALCIUM ION: 1.23 mmol/L (ref 1.15–1.40)
Chloride: 98 mmol/L (ref 98–111)
Creatinine, Ser: 0.9 mg/dL (ref 0.44–1.00)
Glucose, Bld: 82 mg/dL (ref 70–99)
HCT: 41 % (ref 36.0–46.0)
HEMOGLOBIN: 13.9 g/dL (ref 12.0–15.0)
POTASSIUM: 4.4 mmol/L (ref 3.5–5.1)
SODIUM: 140 mmol/L (ref 135–145)
TCO2: 34 mmol/L — ABNORMAL HIGH (ref 22–32)

## 2018-07-25 LAB — MRSA PCR SCREENING: MRSA by PCR: NEGATIVE

## 2018-07-25 LAB — HEPARIN LEVEL (UNFRACTIONATED): HEPARIN UNFRACTIONATED: 0.7 [IU]/mL (ref 0.30–0.70)

## 2018-07-25 LAB — PROTIME-INR
INR: 1
PROTHROMBIN TIME: 13.1 s (ref 11.4–15.2)

## 2018-07-25 LAB — FIBRINOGEN: FIBRINOGEN: 324 mg/dL (ref 210–475)

## 2018-07-25 SURGERY — ABDOMINAL AORTOGRAM W/LOWER EXTREMITY
Anesthesia: LOCAL

## 2018-07-25 MED ORDER — CALCIUM CITRATE-VITAMIN D 315-200 MG-UNIT PO TABS: 2.0000 | ORAL_TABLET | Freq: Two times a day (BID) | ORAL | Status: DC

## 2018-07-25 MED ORDER — LABETALOL HCL 5 MG/ML IV SOLN: 10.0000 mg | INTRAVENOUS | Status: DC | PRN

## 2018-07-25 MED ORDER — TRIAMTERENE-HCTZ 37.5-25 MG PO TABS
1.0000 | ORAL_TABLET | Freq: Every day | ORAL | Status: DC
  Administered 2018-07-27 – 2018-08-03 (×8): 1 via ORAL
  Filled 2018-07-25 (×9): qty 1

## 2018-07-25 MED ORDER — LISINOPRIL 10 MG PO TABS
20.0000 mg | ORAL_TABLET | Freq: Every day | ORAL | Status: DC
  Administered 2018-07-27 – 2018-08-03 (×8): 20 mg via ORAL
  Filled 2018-07-25 (×9): qty 2

## 2018-07-25 MED ORDER — HEPARIN SODIUM (PORCINE) 1000 UNIT/ML IJ SOLN
INTRAMUSCULAR | Status: AC
  Filled 2018-07-25: qty 1

## 2018-07-25 MED ORDER — POTASSIUM CHLORIDE ER 10 MEQ PO TBCR
10.0000 meq | EXTENDED_RELEASE_TABLET | Freq: Every day | ORAL | Status: DC
  Filled 2018-07-25: qty 1

## 2018-07-25 MED ORDER — LISINOPRIL 20 MG PO TABS: 20.0000 mg | ORAL_TABLET | Freq: Every day | ORAL | Status: DC

## 2018-07-25 MED ORDER — ALBUTEROL SULFATE (2.5 MG/3ML) 0.083% IN NEBU: 2.5000 mg | INHALATION_SOLUTION | Freq: Four times a day (QID) | RESPIRATORY_TRACT | Status: DC | PRN

## 2018-07-25 MED ORDER — SODIUM CHLORIDE 0.9% FLUSH: 3.0000 mL | INTRAVENOUS | Status: DC | PRN

## 2018-07-25 MED ORDER — MIRTAZAPINE 15 MG PO TABS
30.0000 mg | ORAL_TABLET | Freq: Every day | ORAL | Status: DC
  Administered 2018-07-25 – 2018-08-02 (×9): 30 mg via ORAL
  Filled 2018-07-25 (×8): qty 2
  Filled 2018-07-25: qty 1
  Filled 2018-07-25: qty 2

## 2018-07-25 MED ORDER — ONDANSETRON HCL 4 MG/2ML IJ SOLN: 4.0000 mg | Freq: Four times a day (QID) | INTRAMUSCULAR | Status: DC | PRN

## 2018-07-25 MED ORDER — MORPHINE SULFATE (PF) 4 MG/ML IV SOLN
4.0000 mg | INTRAVENOUS | Status: DC | PRN
  Administered 2018-07-25 – 2018-07-26 (×8): 4 mg via INTRAVENOUS
  Filled 2018-07-25 (×7): qty 1

## 2018-07-25 MED ORDER — VITAMIN B-12 2500 MCG SL SUBL: 2500.0000 ug | SUBLINGUAL_TABLET | Freq: Every day | SUBLINGUAL | Status: DC

## 2018-07-25 MED ORDER — POTASSIUM CHLORIDE CRYS ER 10 MEQ PO TBCR
10.0000 meq | EXTENDED_RELEASE_TABLET | Freq: Every day | ORAL | Status: DC
  Administered 2018-07-25 – 2018-08-03 (×9): 10 meq via ORAL
  Filled 2018-07-25 (×15): qty 1

## 2018-07-25 MED ORDER — SODIUM CHLORIDE 0.9 % IV SOLN
250.0000 mL | INTRAVENOUS | Status: DC | PRN
  Administered 2018-07-25: 250 mL via INTRAVENOUS

## 2018-07-25 MED ORDER — HEPARIN (PORCINE) IN NACL 100-0.45 UNIT/ML-% IJ SOLN
INTRAMUSCULAR | Status: AC | PRN
  Administered 2018-07-25: 400 [IU]/h via INTRAVENOUS

## 2018-07-25 MED ORDER — MIDAZOLAM HCL 2 MG/2ML IJ SOLN
INTRAMUSCULAR | Status: DC | PRN
  Administered 2018-07-25 (×2): 1 mg via INTRAVENOUS

## 2018-07-25 MED ORDER — LIDOCAINE HCL (PF) 1 % IJ SOLN
INTRAMUSCULAR | Status: AC
  Filled 2018-07-25: qty 30

## 2018-07-25 MED ORDER — VITAMIN B-12 1000 MCG PO TABS
2500.0000 ug | ORAL_TABLET | Freq: Every day | ORAL | Status: DC
  Administered 2018-07-27 – 2018-08-03 (×8): 2500 ug via ORAL
  Filled 2018-07-25 (×8): qty 3

## 2018-07-25 MED ORDER — HEPARIN SODIUM (PORCINE) 1000 UNIT/ML IJ SOLN
INTRAMUSCULAR | Status: DC | PRN
  Administered 2018-07-25: 4000 [IU] via INTRAVENOUS

## 2018-07-25 MED ORDER — SODIUM CHLORIDE 0.9% FLUSH: 3.0000 mL | Freq: Two times a day (BID) | INTRAVENOUS | Status: DC

## 2018-07-25 MED ORDER — PRAVASTATIN SODIUM 20 MG PO TABS: 20.0000 mg | ORAL_TABLET | Freq: Every day | ORAL | Status: DC

## 2018-07-25 MED ORDER — HEPARIN (PORCINE) IN NACL 1000-0.9 UT/500ML-% IV SOLN
INTRAVENOUS | Status: AC
  Filled 2018-07-25: qty 500

## 2018-07-25 MED ORDER — IODIXANOL 320 MG/ML IV SOLN
INTRAVENOUS | Status: DC | PRN
  Administered 2018-07-25: 130 mL via INTRA_ARTERIAL

## 2018-07-25 MED ORDER — HEPARIN (PORCINE) IN NACL 100-0.45 UNIT/ML-% IJ SOLN: 300.0000 [IU]/h | INTRAMUSCULAR | Status: DC

## 2018-07-25 MED ORDER — MORPHINE SULFATE (PF) 10 MG/ML IV SOLN: 5.0000 mg | INTRAVENOUS | Status: DC | PRN

## 2018-07-25 MED ORDER — FENTANYL CITRATE (PF) 100 MCG/2ML IJ SOLN
INTRAMUSCULAR | Status: DC | PRN
  Administered 2018-07-25 (×4): 25 ug via INTRAVENOUS

## 2018-07-25 MED ORDER — SODIUM CHLORIDE 0.9 % IV SOLN
INTRAVENOUS | Status: DC
  Administered 2018-07-25: 10:00:00 via INTRAVENOUS

## 2018-07-25 MED ORDER — MORPHINE SULFATE (PF) 4 MG/ML IV SOLN: 5.0000 mg | INTRAVENOUS | Status: DC | PRN

## 2018-07-25 MED ORDER — TRIAMTERENE-HCTZ 37.5-25 MG PO TABS: 1.0000 | ORAL_TABLET | Freq: Every day | ORAL | Status: DC

## 2018-07-25 MED ORDER — HEPARIN (PORCINE) IN NACL 100-0.45 UNIT/ML-% IJ SOLN
400.0000 [IU]/h | INTRAMUSCULAR | Status: DC
  Filled 2018-07-25: qty 250

## 2018-07-25 MED ORDER — PRAVASTATIN SODIUM 20 MG PO TABS
20.0000 mg | ORAL_TABLET | Freq: Every day | ORAL | Status: DC
  Administered 2018-07-25 – 2018-08-02 (×9): 20 mg via ORAL
  Filled 2018-07-25 (×9): qty 1

## 2018-07-25 MED ORDER — SODIUM CHLORIDE 0.9 % IV SOLN: 250.0000 mL | INTRAVENOUS | Status: DC | PRN

## 2018-07-25 MED ORDER — HYDRALAZINE HCL 20 MG/ML IJ SOLN: 5.0000 mg | INTRAMUSCULAR | Status: DC | PRN

## 2018-07-25 MED ORDER — FENTANYL CITRATE (PF) 100 MCG/2ML IJ SOLN
INTRAMUSCULAR | Status: AC
  Filled 2018-07-25: qty 2

## 2018-07-25 MED ORDER — MIDAZOLAM HCL 2 MG/2ML IJ SOLN: 1.0000 mg | INTRAMUSCULAR | Status: DC | PRN

## 2018-07-25 MED ORDER — LIDOCAINE HCL (PF) 1 % IJ SOLN
INTRAMUSCULAR | Status: DC | PRN
  Administered 2018-07-25: 13 mL

## 2018-07-25 MED ORDER — SODIUM CHLORIDE 0.9 % IV SOLN
0.5000 mg/h | INTRAVENOUS | Status: DC
  Filled 2018-07-25 (×2): qty 10

## 2018-07-25 MED ORDER — SODIUM CHLORIDE 0.9 % IV SOLN
INTRAVENOUS | Status: AC | PRN
  Administered 2018-07-25: 0.5 mg/h

## 2018-07-25 MED ORDER — CALCIUM CITRATE-VITAMIN D 500-500 MG-UNIT PO CHEW
1.0000 | CHEWABLE_TABLET | Freq: Two times a day (BID) | ORAL | Status: DC
  Filled 2018-07-25 (×2): qty 1

## 2018-07-25 MED ORDER — HEPARIN (PORCINE) IN NACL 1000-0.9 UT/500ML-% IV SOLN
INTRAVENOUS | Status: DC | PRN
  Administered 2018-07-25 (×2): 500 mL

## 2018-07-25 MED ORDER — MIRTAZAPINE 30 MG PO TABS
30.0000 mg | ORAL_TABLET | Freq: Every day | ORAL | Status: DC
  Filled 2018-07-25: qty 1

## 2018-07-25 MED ORDER — SODIUM CHLORIDE 0.9% FLUSH
3.0000 mL | Freq: Two times a day (BID) | INTRAVENOUS | Status: DC
  Administered 2018-08-01 (×2): 3 mL via INTRAVENOUS

## 2018-07-25 MED ORDER — MIDAZOLAM HCL 2 MG/2ML IJ SOLN
INTRAMUSCULAR | Status: AC
  Filled 2018-07-25: qty 2

## 2018-07-25 MED ORDER — ALENDRONATE SODIUM 70 MG PO TABS: 70.0000 mg | ORAL_TABLET | ORAL | Status: DC

## 2018-07-25 SURGICAL SUPPLY — 14 items
CATH INFUS 135CMX50CM (CATHETERS) ×2 IMPLANT
CATH OMNI FLUSH 5F 65CM (CATHETERS) ×2 IMPLANT
CATH QUICKCROSS SUPP .035X90CM (MICROCATHETER) ×2 IMPLANT
GUIDEWIRE ANGLED .035X150CM (WIRE) ×2 IMPLANT
KIT MICROPUNCTURE NIT STIFF (SHEATH) ×2 IMPLANT
KIT PV (KITS) ×2 IMPLANT
SHEATH PINNACLE 5F 10CM (SHEATH) ×2 IMPLANT
SHEATH PINNACLE MP 6F 45CM (SHEATH) ×2 IMPLANT
SHEATH PROBE COVER 6X72 (BAG) ×2 IMPLANT
SYR MEDRAD MARK V 150ML (SYRINGE) ×2 IMPLANT
TRANSDUCER W/STOPCOCK (MISCELLANEOUS) ×2 IMPLANT
TRAY PV CATH (CUSTOM PROCEDURE TRAY) ×2 IMPLANT
WIRE BENTSON .035X145CM (WIRE) ×2 IMPLANT
WIRE ROSEN-J .035X260CM (WIRE) ×2 IMPLANT
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
## 2018-07-25 NOTE — Op Note (Signed)
    Patient name: Kristin Knight MRN: [id number] DOB: August 01, 1946 Sex: female  07/25/2018 Pre-operative Diagnosis: occluded left fem pop bypass Post-operative diagnosis:  Same Surgeon:  Annamarie Major Procedure Performed:  1.  U/s guided access, right femoral artery  2.  Abdominal aortogram  3.  Bilateral lower extremity runoff  4.  Conscious sedation (40 Minutes)  5.  Placement of catheter in left popliteal artery     Indications: The patient has undergone multiple attempts at salvage of her left femoral-popliteal bypass graft.  This has reoccluded approximately 1 week ago.  She is here today for further evaluation and possible intervention.  Procedure:  The patient was identified in the holding area and taken to room 8.  The patient was then placed supine on the table and prepped and draped in the usual sterile fashion.  A time out was called.  Conscious sedation was a minister with the use of IV fentanyl and Versed under continuous physician and nurse monitoring.  Heart rate, blood pressure, and oxygen saturation were continuously monitored.  Ultrasound was used to evaluate the right common femoral artery.  It was patent .  A digital ultrasound image was acquired.  A micropuncture needle was used to access the right common femoral artery under ultrasound guidance.  An 018 wire was advanced without resistance and a micropuncture sheath was placed.  The 018 wire was removed and a benson wire was placed.  The micropuncture sheath was exchanged for a 5 french sheath.  An omniflush catheter was advanced over the wire to the level of L-1.  An abdominal angiogram was obtained.  Next, the catheter was pulled out the aortic bifurcation and bilateral runoff was obtained.  Findings:   Aortogram: No significant renal artery stenosis.  The infrarenal abdominal aorta is widely patent.  Bilateral common and external iliac arteries widely patent.  Right Lower Extremity: Right common femoral and profundofemoral  artery widely patent.  The superficial femoral artery is patent without significant stenosis.  There is luminal irregularity throughout the adductor canal.  There is three-vessel runoff.  Left Lower Extremity: The left common femoral and profundofemoral artery are widely patent.  There is a order to the occluded left femoral-popliteal bypass graft.  There is reconstitution of the above-knee popliteal artery.  The dominant runoff is the posterior tibial artery  Intervention: After the above images were acquired the decision was made to proceed with intervention.  The aortic bifurcation was crossed with a Omni Flush catheter and a Bentson wire.  A 6 French 45 cm sheath was advanced over the aortic bifurcation and placed into the left external iliac artery.  The patient was fully heparinized.  I then used a 035 Glidewire and quick cross catheter to select the stump of the occluded bypass graft.  The wire and catheter advanced easily through the graft.  A contrast injection was performed with the catheter in the above-knee popliteal artery after crossing the stenosis.  A 50 cm UniFuse catheter was then inserted for initiation of thrombolytic therapy.  Impression:  #1  Occlusion of left femoral-popliteal bypass graft  #2  Catheter placement across the occluded bypass graft for initiation of lytic therapy  #3  Patient will be brought back tomorrow for follow-up lysis study.   Theotis Burrow, M.D. [facility] Office: [phone] Pager:  [phone]
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
## 2018-07-25 NOTE — Plan of Care (Signed)
Patient arrived on unit around 1530. Vital signs stable. CHG bath completed. Lower extremity pulses doppler. Right femoral sheath level zero. Patient instructed that head cannot be above 30 degrees and to keep right leg straight. Pain medication given for chronic back pain. Will continue to monitor.  Problem: Clinical Measurements: Goal: Respiratory complications will improve Outcome: Progressing   Problem: Elimination: Goal: Will not experience complications related to urinary retention Outcome: Progressing   Problem: Pain Managment: Goal: General experience of comfort will improve Outcome: Progressing   Problem: Clinical Measurements: Goal: Signs and symptoms of graft occlusion will improve Outcome: Progressing

## 2018-07-25 NOTE — Progress Notes (Signed)
Paged Trula Slade, MD about lab sticks d/t alteplase gtt. Ordered to only get CBC, fibrinogen, and heparin levels at 2200 and 0400 in the am. Will adjust orders and continue to monitor patient.

## 2018-07-25 NOTE — Interval H&P Note (Signed)
History and Physical Interval Note:  07/25/2018 1:12 PM  Kristin Knight  has presented today for surgery, with the diagnosis of pvd  The various methods of treatment have been discussed with the patient and family. After consideration of risks, benefits and other options for treatment, the patient has consented to  Procedure(s): ABDOMINAL AORTOGRAM W/LOWER EXTREMITY (N/A) as a surgical intervention .  The patient's history has been reviewed, patient examined, no change in status, stable for surgery.  I have reviewed the patient's chart and labs.  Questions were answered to the patient's satisfaction.     Annamarie Major

## 2018-07-26 ENCOUNTER — Ambulatory Visit (HOSPITAL_COMMUNITY): Admission: RE | Admit: 2018-07-26 | Payer: Medicare HMO | Source: Ambulatory Visit | Admitting: Vascular Surgery

## 2018-07-26 ENCOUNTER — Encounter (HOSPITAL_COMMUNITY)
Admission: AD | Disposition: A | Discharge status: Home / Self Care | Payer: Self-pay | Source: Home / Self Care | Attending: Surgery

## 2018-07-26 ENCOUNTER — Encounter (HOSPITAL_COMMUNITY): Payer: Self-pay | Admitting: Surgery

## 2018-07-26 HISTORY — PX: PERIPHERAL VASCULAR BALLOON ANGIOPLASTY: CATH118281

## 2018-07-26 HISTORY — PX: LOWER EXTREMITY ANGIOGRAPHY: CATH118251

## 2018-07-26 LAB — CBC
HCT: 37.6 % (ref 36.0–46.0)
Hemoglobin: 11.5 g/dL — ABNORMAL LOW (ref 12.0–15.0)
MCH: 28.9 pg (ref 26.0–34.0)
MCHC: 30.6 g/dL (ref 30.0–36.0)
MCV: 94.5 fL (ref 78.0–100.0)
PLATELETS: 155 10*3/uL (ref 150–400)
RBC: 3.98 MIL/uL (ref 3.87–5.11)
RDW: 14.6 % (ref 11.5–15.5)
WBC: 5.2 10*3/uL (ref 4.0–10.5)

## 2018-07-26 LAB — BASIC METABOLIC PANEL
Anion gap: 8 (ref 5–15)
BUN: 10 mg/dL (ref 8–23)
CALCIUM: 9.3 mg/dL (ref 8.9–10.3)
CHLORIDE: 102 mmol/L (ref 98–111)
CO2: 30 mmol/L (ref 22–32)
CREATININE: 0.78 mg/dL (ref 0.44–1.00)
GFR calc Af Amer: >60 mL/min (ref >60)
GFR calc non Af Amer: >60 mL/min (ref >60)
GLUCOSE: 94 mg/dL (ref 70–99)
Potassium: 3.5 mmol/L (ref 3.5–5.1)
Sodium: 140 mmol/L (ref 135–145)

## 2018-07-26 LAB — HEPARIN LEVEL (UNFRACTIONATED)
Heparin Unfractionated: 0.22 IU/mL — ABNORMAL LOW (ref 0.30–0.70)
Heparin Unfractionated: 0.18 IU/mL — ABNORMAL LOW (ref 0.30–0.70)

## 2018-07-26 LAB — FIBRINOGEN: FIBRINOGEN: 347 mg/dL (ref 210–475)

## 2018-07-26 SURGERY — LOWER EXTREMITY ANGIOGRAPHY
Anesthesia: LOCAL | Laterality: Left

## 2018-07-26 MED ORDER — HEPARIN (PORCINE) IN NACL 100-0.45 UNIT/ML-% IJ SOLN
600.0000 [IU]/h | INTRAMUSCULAR | Status: DC
  Administered 2018-07-26: 350 [IU]/h via INTRAVENOUS
  Administered 2018-07-28: 600 [IU]/h via INTRAVENOUS
  Filled 2018-07-26 (×4): qty 250

## 2018-07-26 MED ORDER — ONDANSETRON HCL 4 MG/2ML IJ SOLN: 4.0000 mg | Freq: Four times a day (QID) | INTRAMUSCULAR | Status: DC | PRN

## 2018-07-26 MED ORDER — CLOPIDOGREL BISULFATE 75 MG PO TABS
75.0000 mg | ORAL_TABLET | Freq: Every day | ORAL | Status: DC
  Administered 2018-07-27 – 2018-08-03 (×8): 75 mg via ORAL
  Filled 2018-07-26 (×8): qty 1

## 2018-07-26 MED ORDER — HEPARIN SODIUM (PORCINE) 1000 UNIT/ML IJ SOLN
INTRAMUSCULAR | Status: DC | PRN
  Administered 2018-07-26: 4000 [IU] via INTRAVENOUS

## 2018-07-26 MED ORDER — ACETAMINOPHEN 325 MG PO TABS
650.0000 mg | ORAL_TABLET | ORAL | Status: DC | PRN
  Administered 2018-07-27 – 2018-07-31 (×2): 650 mg via ORAL
  Filled 2018-07-26 (×4): qty 2

## 2018-07-26 MED ORDER — SODIUM CHLORIDE 0.9 % IV SOLN: 250.0000 mL | INTRAVENOUS | Status: DC | PRN

## 2018-07-26 MED ORDER — LABETALOL HCL 5 MG/ML IV SOLN: 10.0000 mg | INTRAVENOUS | Status: DC | PRN

## 2018-07-26 MED ORDER — IODIXANOL 320 MG/ML IV SOLN
INTRAVENOUS | Status: DC | PRN
  Administered 2018-07-26: 35 mL via INTRAVENOUS

## 2018-07-26 MED ORDER — SODIUM CHLORIDE 0.9 % WEIGHT BASED INFUSION: 1.0000 mL/kg/h | INTRAVENOUS | Status: AC

## 2018-07-26 MED ORDER — HEPARIN (PORCINE) IN NACL 1000-0.9 UT/500ML-% IV SOLN
INTRAVENOUS | Status: DC | PRN
  Administered 2018-07-26: 500 mL

## 2018-07-26 MED ORDER — SODIUM CHLORIDE 0.9% FLUSH: 3.0000 mL | INTRAVENOUS | Status: DC | PRN

## 2018-07-26 MED ORDER — FENTANYL CITRATE (PF) 100 MCG/2ML IJ SOLN
INTRAMUSCULAR | Status: DC | PRN
  Administered 2018-07-26 (×3): 25 ug via INTRAVENOUS

## 2018-07-26 MED ORDER — HYDRALAZINE HCL 20 MG/ML IJ SOLN: 5.0000 mg | INTRAMUSCULAR | Status: DC | PRN

## 2018-07-26 MED ORDER — SODIUM CHLORIDE 0.9% FLUSH
3.0000 mL | Freq: Two times a day (BID) | INTRAVENOUS | Status: DC
  Administered 2018-07-29 – 2018-08-01 (×3): 3 mL via INTRAVENOUS

## 2018-07-26 MED ORDER — HEPARIN (PORCINE) IN NACL 100-0.45 UNIT/ML-% IJ SOLN: 350.0000 [IU]/h | INTRAMUSCULAR | Status: DC

## 2018-07-26 MED ORDER — MORPHINE SULFATE (PF) 4 MG/ML IV SOLN
INTRAVENOUS | Status: AC
  Filled 2018-07-26: qty 1

## 2018-07-26 MED ORDER — MIDAZOLAM HCL 2 MG/2ML IJ SOLN
INTRAMUSCULAR | Status: DC | PRN
  Administered 2018-07-26: 1 mg via INTRAVENOUS

## 2018-07-26 MED ORDER — SODIUM CHLORIDE 0.9 % IV SOLN
INTRAVENOUS | Status: DC
  Administered 2018-07-26 – 2018-08-03 (×10): via INTRAVENOUS

## 2018-07-26 SURGICAL SUPPLY — 13 items
BAG SNAP BAND KOVER 36X36 (MISCELLANEOUS) ×3 IMPLANT
BALLN MUSTANG 4X20X135 (BALLOONS) ×3
BALLOON MUSTANG 4X20X135 (BALLOONS) ×2 IMPLANT
CATH MUSTANG 3X20X135 (BALLOONS) ×6 IMPLANT
CATH MUSTANG 3X40X135 (BALLOONS) ×3 IMPLANT
COVER DOME SNAP 22 D (MISCELLANEOUS) ×3 IMPLANT
DEVICE CLOSURE MYNXGRIP 6/7F (Vascular Products) ×3 IMPLANT
KIT ENCORE 26 ADVANTAGE (KITS) ×3 IMPLANT
SHEATH PINNACLE 6F 10CM (SHEATH) ×3 IMPLANT
SYR 10CC CONTROL (SYRINGE) ×3 IMPLANT
TRANSDUCER W/STOPCOCK (MISCELLANEOUS) ×3 IMPLANT
TRAY PV CATH (CUSTOM PROCEDURE TRAY) ×3 IMPLANT
WIRE HI TORQ VERSACORE J 260CM (WIRE) ×3 IMPLANT
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
## 2018-07-26 NOTE — Plan of Care (Signed)
  Problem: Activity: Goal: Ability to return to baseline activity level will improve Outcome: Progressing   Problem: Cardiovascular: Goal: Ability to achieve and maintain adequate cardiovascular perfusion will improve Outcome: Progressing Goal: Vascular access site(s) Level 0-1 will be maintained Outcome: Progressing   

## 2018-07-26 NOTE — Progress Notes (Signed)
Patient received from Cath lab A&Ox4, VSS, right groin stable. Telemetry applied and CCMD notified.

## 2018-07-26 NOTE — Progress Notes (Signed)
ANTICOAGULATION CONSULT NOTE  Pharmacy Consult:  Heparin  Indication: Occluded bypass graft  Allergies  Allergen Reactions  . Penicillins Anaphylaxis and Other (See Comments)    Patient passed out Has patient had a PCN reaction causing immediate rash, facial/tongue/throat swelling, SOB or lightheadedness with hypotension: No Has patient had a PCN reaction causing severe rash involving mucus membranes or skin necrosis: No Has patient had a PCN reaction that required hospitalization: No Has patient had a PCN reaction occurring within the last 10 years: No If all of the above answers are "NO", then may proceed with Cephalosporin use.   . Meperidine Hcl Other (See Comments)    nevousness (Demerol)  . Chantix [Varenicline] Nausea And Vomiting    Patient Measurements: Height: 5\' 4"  (162.6 cm) Weight: 90 lb (40.8 kg) IBW/kg (Calculated) : 54.7 HEPARIN DW (KG): 40.8 kg  Vital Signs: Temp: 98.4 F (36.9 C) (09/11 1455) Temp Source: Oral (09/11 1455) BP: 112/61 (09/11 1430) Pulse Rate: 57 (09/11 1455)  Labs: Recent Labs    07/25/18 0939 07/25/18 1027 07/25/18 2222 07/26/18 0522 07/26/18 1914  HGB 13.9  --  11.2* 11.5*  --   HCT 41.0  --  36.5 37.6  --   PLT  --   --  165 155  --   LABPROT  --  13.1  --   --   --   INR  --  1.00  --   --   --   HEPARINUNFRC  --   --  0.70 0.22* 0.18*  CREATININE 0.90  --   --  0.78  --     Estimated Creatinine Clearance: 41.5 mL/min (by C-G formula based on SCr of 0.78 mg/dL).   Assessment: 20 YOF with occluded left femoral-popliteal bypass graft.  Coumadin from PTA was switched to IV heparin.  She was also receiving alteplase through the catheter, but now off post IR this AM.  Pharmacy consulted to resume IV heparin.  Heparin level is subtherapeutic at 0.18, on 350 units/hr. Reported slight bleeding from site earlier which has since resolved per nursing. Heparin infusion infusing in R hand - level drawn from opposite arm. Given high  bleeding risk, MD asks to keep heparin at low therapeutic range.  Goal of Therapy:  Heparin level 0.3-0.5 units/ml per MD Monitor platelets by anticoagulation protocol: Yes   Plan:  Increase heparin gtt to 400 units/hr Check 8 hr heparin level Daily heparin level and CBC Monitor closely for bleeding  Doylene Canard, PharmD Clinical Pharmacist  Pager: [phone] Phone: [phone] 07/26/2018, 8:09 PM

## 2018-07-26 NOTE — Progress Notes (Signed)
[facility]  Subjective  - Some improvement in left leg symptoms.  Lysis of LLE fem AK pop bypass last night.   Objective 103/71 (!) 50 98.7 F (37.1 C) (Oral) 13 98%  Intake/Output Summary (Last 24 hours) at 07/26/2018 0827 Last data filed at 07/26/2018 0600 Gross per 24 hour  Intake 423.53 ml  Output 1075 ml  Net -651.47 ml    General: NAD, pleasant, resting Sheath in right groin Left PT signal biphasic  Laboratory Lab Results: Recent Labs    07/25/18 2222 07/26/18 0522  WBC 6.3 5.2  HGB 11.2* 11.5*  HCT 36.5 37.6  PLT 165 155   BMET Recent Labs    07/25/18 0939 07/26/18 0522  NA 140 140  K 4.4 3.5  CL 98 102  CO2  --  30  GLUCOSE 82 94  BUN 21 10  CREATININE 0.90 0.78  CALCIUM  --  9.3    COAG Lab Results  Component Value Date   INR 1.00 07/25/2018   INR 1.2 (A) 07/19/2018   INR 1.17 07/17/2018   No results found for: PTT  Assessment/Planning:  Left lower extremity lytics check today.  Fibrinogen stable 340+.  Occluded left fem to AK pop prosthetic bypass.  Marty Heck 07/26/2018 8:27 AM --  Marty Heck

## 2018-07-26 NOTE — Progress Notes (Signed)
ANTICOAGULATION CONSULT NOTE  Pharmacy Consult:  Heparin  Indication: Occluded bypass graft  Allergies  Allergen Reactions  . Penicillins Anaphylaxis and Other (See Comments)    Patient passed out Has patient had a PCN reaction causing immediate rash, facial/tongue/throat swelling, SOB or lightheadedness with hypotension: No Has patient had a PCN reaction causing severe rash involving mucus membranes or skin necrosis: No Has patient had a PCN reaction that required hospitalization: No Has patient had a PCN reaction occurring within the last 10 years: No If all of the above answers are "NO", then may proceed with Cephalosporin use.   . Meperidine Hcl Other (See Comments)    nevousness (Demerol)  . Chantix [Varenicline] Nausea And Vomiting    Patient Measurements: Height: 5\' 4"  (162.6 cm) Weight: 90 lb (40.8 kg) IBW/kg (Calculated) : 54.7 HEPARIN DW (KG): 40.8 kg  Vital Signs: Temp: 98.7 F (37.1 C) (09/11 0400) Temp Source: Oral (09/11 0400) BP: 143/87 (09/11 1005) Pulse Rate: 55 (09/11 1005)  Labs: Recent Labs    07/25/18 0939 07/25/18 1027 07/25/18 2222 07/26/18 0522  HGB 13.9  --  11.2* 11.5*  HCT 41.0  --  36.5 37.6  PLT  --   --  165 155  LABPROT  --  13.1  --   --   INR  --  1.00  --   --   HEPARINUNFRC  --   --  0.70 0.22*  CREATININE 0.90  --   --  0.78    Estimated Creatinine Clearance: 41.5 mL/min (by C-G formula based on SCr of 0.78 mg/dL).   Assessment: 62 YOF with occluded left femoral-popliteal bypass graft.  Coumadin from PTA was switched to IV heparin.  She was also receiving alteplase through the catheter, but now off post IR this AM.  Pharmacy consulted to resume IV heparin.  Previously slightly sub-therapeutic on 300 units/hr of heparin.  She was also oozing from site.  Given high bleeding risk, MD asks to keep heparin at low therapeutic range.   Goal of Therapy:  Heparin level 0.3-0.5 units/ml per MD Monitor platelets by anticoagulation  protocol: Yes     Plan:  Resume heparin gtt at 350 units/hr Check 8 hr heparin level Daily heparin level and CBC Monitor closely for bleeding   Kristin Knight, PharmD, BCPS, [place] 07/26/2018, 10:35 AM

## 2018-07-26 NOTE — Op Note (Signed)
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  Thrombolytics catheter check of the left lower extremity 2.  Left above-knee popliteal anastomosis angioplasty (3.0 mm x 20 mm Mustang and 4.0 mm x 20 mm Mustang) 3.  Native left below-knee popliteal artery angioplasty (3 mm x 40 mm Mustang) 4.  Mynx closure of the right common femoral artery 5.  40 minutes of monitored conscious sedation time    Indications: Patient is a 72 year old female who was recently seen in the ED with new onset claudication symptoms of her left lower extremity.  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  She has undergone previous thrombectomy as well as multiple thrombolysis of this graft in the past.  She continues to smoke and most recently stopped her Coumadin.  She presents today for a thrombolytics check after undergoing a left lower extremity arteriogram yesterday and placing a UniFuse catheter in her bypass.  Findings:  The left common femoral to above-knee popliteal bypass is now patent after overnight thrombolysis.  Dominant runoff in the left lower extremity was preserved via the posterior tibial artery.  We performed angioplasty of the above-knee popliteal anastomosis given what appeared to be a greater than 50% stenosis that resolved with angioplasty.  We also treated the native below-knee popliteal artery with additional angioplasty given a greater than 80% stenosis that resolved with angioplasty.  At the completion of the case dominant runoff was preserved via the posterior tibial artery in the left leg and was brisk.  There was also a collateral that filled the anterior tibial artery in the lower calf with filling of the dorsalis pedis  distally and retrograde filling of the AT proximally.   Procedure:   The patient was taken to the [facility] lab room 8.  She was placed on operating table in supine position.  Her bilateral groins were prepped and draped in usual sterile fashion.  Timeout was performed to identify patient, procedure, and site.  We initially removed the wire from the UniFuse catheter in the right groin and then placed a versa core wire down to her below-knee popliteal artery.  We subsequently removed the UniFuse catheter as well.  Hand injected angiogram of the left lower extremity was then obtained via injection of the long sheath.  Ultimately this showed now a patent left common femoral to above-knee popliteal bypass.  There was flow throughout the graft and appeared to be a outflow stenosis at the above-knee popliteal anastomosis that was approximately 50%.  In addition she had a high-grade greater than 80% stenosis of her below-knee native popliteal artery.  Dominant runoff was preserved via the posterior tibial artery into the left foot.  We then gave 4000 units of IV heparin.  We initially angioplastied the above-knee popliteal anastomosis initially with a 3.0 mm x 20 mm Mustang and then a 4.0 mm x 20 mm Mustang.  We were not more aggressive here given that the popliteal artery only measured 3 mm.  We additionally made the decision to treat her below-knee native popliteal artery where she appeared to have a greater than 80% stenosis as noted above.  This was treated with a 3 mm x 40 mm Mustang.  On the initial completion arteriogram she appeared to have a small dissection although this did not appear  flow-limiting and contrast was brisk.  We put the balloon back down and reinflated the 3 mm x 40 mm Mustang to nominal presure for 2 minutes.  We then obtained a slightly different left anterior oblique image and shot an additional picture and there was no longer over evidence of a dissection and certainly there was no hang-up of  contrast with brisk flow into the tibials.  A final shot of the left foot showed preserved runoff to the posterior tibial into the foot with collateral fill in the AT and flow into the dorsalis pedis as well.  She had a great posterior tibial signal at the completion of the case.  We then exchanged for a short 6 French sheath in the right groin.  A Mynx closure device was deployed.  She was taken to holding stable.     Marty Heck, MD [facility] [place] Office: [phone] Pager:    Marty Heck

## 2018-07-27 ENCOUNTER — Telehealth: Payer: Self-pay | Admitting: Surgery

## 2018-07-27 ENCOUNTER — Encounter (HOSPITAL_COMMUNITY): Payer: Self-pay | Admitting: Vascular Surgery

## 2018-07-27 LAB — HEPARIN LEVEL (UNFRACTIONATED)
HEPARIN UNFRACTIONATED: 0.18 [IU]/mL — AB (ref 0.30–0.70)
Heparin Unfractionated: 0.19 IU/mL — ABNORMAL LOW (ref 0.30–0.70)

## 2018-07-27 LAB — CBC
HCT: 31.6 % — ABNORMAL LOW (ref 36.0–46.0)
Hemoglobin: 9.7 g/dL — ABNORMAL LOW (ref 12.0–15.0)
MCH: 29 pg (ref 26.0–34.0)
MCHC: 30.7 g/dL (ref 30.0–36.0)
MCV: 94.3 fL (ref 78.0–100.0)
Platelets: 124 10*3/uL — ABNORMAL LOW (ref 150–400)
RBC: 3.35 MIL/uL — ABNORMAL LOW (ref 3.87–5.11)
RDW: 14.4 % (ref 11.5–15.5)
WBC: 4.2 10*3/uL (ref 4.0–10.5)

## 2018-07-27 MED ORDER — WARFARIN - PHARMACIST DOSING INPATIENT
Freq: Every day | Status: DC
  Administered 2018-07-27: 18:00:00

## 2018-07-27 MED ORDER — WARFARIN SODIUM 4 MG PO TABS
4.0000 mg | ORAL_TABLET | Freq: Once | ORAL | Status: AC
  Administered 2018-07-27: 4 mg via ORAL
  Filled 2018-07-27: qty 1

## 2018-07-27 NOTE — Progress Notes (Addendum)
  Progress Note    07/27/2018 7:15 AM 1 Day Post-Op  Subjective:  No rest pain L foot.  Minimal soreness R groin   Vitals:   07/26/18 1430 07/26/18 1455  BP: 112/61   Pulse:  (!) 57  Resp:  13  Temp:  98.4 F (36.9 C)  SpO2:  97%   Physical Exam: Lungs:  Non labored Incisions:  R groin cath site sore to palpation but no hematoma or continued bleeding Extremities:  Brisk L PTA by doppler Abdomen:  Soft Neurologic: A&O  CBC    Component Value Date/Time   WBC 4.2 07/27/2018 0346   RBC 3.35 (L) 07/27/2018 0346   HGB 9.7 (L) 07/27/2018 0346   HCT 31.6 (L) 07/27/2018 0346   PLT 124 (L) 07/27/2018 0346   MCV 94.3 07/27/2018 0346   MCH 29.0 07/27/2018 0346   MCHC 30.7 07/27/2018 0346   RDW 14.4 07/27/2018 0346   LYMPHSABS 2.1 07/17/2018 1201   MONOABS 0.5 07/17/2018 1201   EOSABS 0.1 07/17/2018 1201   BASOSABS 0.0 07/17/2018 1201    BMET    Component Value Date/Time   NA 140 07/26/2018 0522   NA 140 02/17/2018 1129   K 3.5 07/26/2018 0522   CL 102 07/26/2018 0522   CO2 30 07/26/2018 0522   GLUCOSE 94 07/26/2018 0522   BUN 10 07/26/2018 0522   BUN 19 02/17/2018 1129   CREATININE 0.78 07/26/2018 0522   CREATININE 0.95 04/04/2015 1017   CALCIUM 9.3 07/26/2018 0522   GFRNONAA >60 07/26/2018 0522   GFRNONAA 62 04/04/2015 1017   GFRAA >60 07/26/2018 0522   GFRAA 71 04/04/2015 1017    INR    Component Value Date/Time   INR 1.00 07/25/2018 1027     Intake/Output Summary (Last 24 hours) at 07/27/2018 0715 Last data filed at 07/27/2018 0349 Gross per 24 hour  Intake 360 ml  Output 350 ml  Net 10 ml     Assessment/Plan:  72 y.o. female is s/p thrombolysis L fem-pop with angioplasty of distal anastomosis and native popliteal artery 1 Day Post-Op   Patent bypass with brisk L PTA signal by doppler into L foot Pharmacy to transition from IV heparin to coumadin Ok to ambulate D/c when INR therapeutic   Dagoberto Ligas, PA-C [facility] [phone] 07/27/2018 7:15 AM  Palpable left PT.  On heparin and coumadin.  Will ask care management to see how much a NOAC would cost.  D/c once anticoagulation theraputic.  Kristin Knight

## 2018-07-27 NOTE — Telephone Encounter (Signed)
sch appt spk to pt mld ltr 08/28/18 1230pm ABI 130pm LE Bypass graft + 215pm f/u NP

## 2018-07-27 NOTE — Progress Notes (Signed)
ANTICOAGULATION CONSULT NOTE  Pharmacy Consult:  Heparin / Warfarin Indication: Occluded bypass graft  Allergies  Allergen Reactions  . Penicillins Anaphylaxis and Other (See Comments)    Patient passed out Has patient had a PCN reaction causing immediate rash, facial/tongue/throat swelling, SOB or lightheadedness with hypotension: No Has patient had a PCN reaction causing severe rash involving mucus membranes or skin necrosis: No Has patient had a PCN reaction that required hospitalization: No Has patient had a PCN reaction occurring within the last 10 years: No If all of the above answers are "NO", then may proceed with Cephalosporin use.   . Meperidine Hcl Other (See Comments)    nevousness (Demerol)  . Chantix [Varenicline] Nausea And Vomiting    Labs: Recent Labs    07/25/18 0939 07/25/18 1027  07/25/18 2222 07/26/18 0522 07/26/18 1914 07/27/18 0346 07/27/18 0806  HGB 13.9  --   --  11.2* 11.5*  --  9.7*  --   HCT 41.0  --   --  36.5 37.6  --  31.6*  --   PLT  --   --   --  165 155  --  124*  --   LABPROT  --  13.1  --   --   --   --   --   --   INR  --  1.00  --   --   --   --   --   --   HEPARINUNFRC  --   --    < > 0.70 0.22* 0.18*  --  0.19*  CREATININE 0.90  --   --   --  0.78  --   --   --    < > = values in this interval not displayed.    Estimated Creatinine Clearance: 41.5 mL/min (by C-G formula based on SCr of 0.78 mg/dL).   Assessment: 42 YOF with occluded left femoral-popliteal bypass graft.  Coumadin from PTA was switched to IV heparin.  She was also receiving alteplase through the catheter, but now off post IR this AM.  Pharmacy consulted to resume IV heparin and  warfarin  Heparin level = 0.19 this AM  Goal of Therapy:  Heparin level 0.3-0.5 units/ml per MD Monitor platelets by anticoagulation protocol: Yes  INR = 2 to 3  Plan:  Increase heparin gtt to 500 units/hr Check 8 hr heparin level Warfarin 4 mg po x 1 tonight Daily heparin level,  CBC, INR Monitor closely for bleeding  Thank you Anette Guarneri, PharmD [phone] 07/27/2018, 9:05 AM

## 2018-07-27 NOTE — Care Management (Signed)
07-27-18  BENEFITS CHECK :  #  2.   S/W JALEESA  @ [facility] [employer] # [phone] OPT- 2  1. ELIQUIS  5 MG BID COVER- YES CO-PAY- $ 47.00 TIER- 3 DRUG PRIOR APPROVAL- NO  2. XARELTO 20 MG BID COVER- YES CO-PAY- $ 47.00 TIER- 3 DRUG PRIOR APPROVAL- NO  PREFERRED PHARMACY :  YES  [facility]

## 2018-07-27 NOTE — Progress Notes (Signed)
ANTICOAGULATION CONSULT NOTE  Pharmacy Consult:  Heparin Indication: Occluded bypass graft   Labs: Recent Labs    07/25/18 0939 07/25/18 1027  07/25/18 2222 07/26/18 0522 07/26/18 1914 07/27/18 0346 07/27/18 0806 07/27/18 1717  HGB 13.9  --   --  11.2* 11.5*  --  9.7*  --   --   HCT 41.0  --   --  36.5 37.6  --  31.6*  --   --   PLT  --   --   --  165 155  --  124*  --   --   LABPROT  --  13.1  --   --   --   --   --   --   --   INR  --  1.00  --   --   --   --   --   --   --   HEPARINUNFRC  --   --    < > 0.70 0.22* 0.18*  --  0.19* 0.18*  CREATININE 0.90  --   --   --  0.78  --   --   --   --    < > = values in this interval not displayed.    Estimated Creatinine Clearance: 41.5 mL/min (by C-G formula based on SCr of 0.78 mg/dL).   Assessment: 15 YOF with occluded left femoral-popliteal bypass graft.  Coumadin from PTA was switched to IV heparin.  She was also receiving alteplase through the catheter, but now off post IR this AM.  Pharmacy consulted to resume IV heparin and  warfarin  Heparin level this evening remains SUBtherapeutic despite a rate increase earlier today (HL 0.18 << 0.19, goal of 0.3-0.5). RN checked infusion site and no issues with the drip. No bleeding noted at this time.   Goal of Therapy:  Heparin level 0.3-0.5 units/ml per MD Monitor platelets by anticoagulation protocol: Yes  INR = 2 to 3  Plan:  - Increase Heparin drip to 600 units/hr (6 ml/hr) - Will continue to monitor for any signs/symptoms of bleeding and will follow up with heparin level in 8 hours   Thank you for allowing pharmacy to be a part of this patient's care.  Alycia Rossetti, PharmD, BCPS Clinical Pharmacist Pager: [phone] Please check AMION for all [place] numbers 07/27/2018 6:24 PM

## 2018-07-28 ENCOUNTER — Other Ambulatory Visit: Payer: Self-pay

## 2018-07-28 DIAGNOSIS — I779 Disorder of arteries and arterioles, unspecified: Secondary | ICD-10-CM

## 2018-07-28 LAB — CBC
HEMATOCRIT: 32.9 % — AB (ref 36.0–46.0)
HEMOGLOBIN: 10.3 g/dL — AB (ref 12.0–15.0)
MCH: 29.3 pg (ref 26.0–34.0)
MCHC: 31.3 g/dL (ref 30.0–36.0)
MCV: 93.5 fL (ref 78.0–100.0)
Platelets: 124 10*3/uL — ABNORMAL LOW (ref 150–400)
RBC: 3.52 MIL/uL — AB (ref 3.87–5.11)
RDW: 14 % (ref 11.5–15.5)
WBC: 5.1 10*3/uL (ref 4.0–10.5)

## 2018-07-28 LAB — BASIC METABOLIC PANEL
Anion gap: 7 (ref 5–15)
BUN: 8 mg/dL (ref 8–23)
CHLORIDE: 105 mmol/L (ref 98–111)
CO2: 27 mmol/L (ref 22–32)
CREATININE: 0.84 mg/dL (ref 0.44–1.00)
Calcium: 8.9 mg/dL (ref 8.9–10.3)
GFR calc non Af Amer: >60 mL/min (ref >60)
Glucose, Bld: 129 mg/dL — ABNORMAL HIGH (ref 70–99)
Potassium: 3.6 mmol/L (ref 3.5–5.1)
SODIUM: 139 mmol/L (ref 135–145)

## 2018-07-28 LAB — HEPARIN LEVEL (UNFRACTIONATED)
HEPARIN UNFRACTIONATED: 0.4 [IU]/mL (ref 0.30–0.70)
Heparin Unfractionated: 0.38 IU/mL (ref 0.30–0.70)

## 2018-07-28 LAB — PROTIME-INR
INR: 1.12
Prothrombin Time: 14.3 seconds (ref 11.4–15.2)

## 2018-07-28 MED ORDER — CLOPIDOGREL BISULFATE 75 MG PO TABS: 75.0000 mg | ORAL_TABLET | Freq: Every day | ORAL | 1 refills | Status: DC

## 2018-07-28 MED ORDER — WARFARIN SODIUM 5 MG PO TABS
5.0000 mg | ORAL_TABLET | Freq: Once | ORAL | Status: AC
  Administered 2018-07-28: 5 mg via ORAL
  Filled 2018-07-28: qty 1

## 2018-07-28 NOTE — Progress Notes (Addendum)
  Progress Note    07/28/2018 7:38 AM 2 Days Post-Op  Subjective:  No rest pain. Soreness R groin improved   Vitals:   07/27/18 2002 07/28/18 0458  BP: 121/78 118/70  Pulse:  (!) 51  Resp: (!) 21 20  Temp: 98 F (36.7 C) 98.2 F (36.8 C)  SpO2: 98% 98%   Physical Exam: Lungs:  Non labored  Incisions:  R groin cath site unremarkable Extremities:  L PTA signal by doppler Abdomen:  Soft Neurologic: A&O  CBC    Component Value Date/Time   WBC 5.1 07/28/2018 0304   RBC 3.52 (L) 07/28/2018 0304   HGB 10.3 (L) 07/28/2018 0304   HCT 32.9 (L) 07/28/2018 0304   PLT 124 (L) 07/28/2018 0304   MCV 93.5 07/28/2018 0304   MCH 29.3 07/28/2018 0304   MCHC 31.3 07/28/2018 0304   RDW 14.0 07/28/2018 0304   LYMPHSABS 2.1 07/17/2018 1201   MONOABS 0.5 07/17/2018 1201   EOSABS 0.1 07/17/2018 1201   BASOSABS 0.0 07/17/2018 1201    BMET    Component Value Date/Time   NA 140 07/26/2018 0522   NA 140 02/17/2018 1129   K 3.5 07/26/2018 0522   CL 102 07/26/2018 0522   CO2 30 07/26/2018 0522   GLUCOSE 94 07/26/2018 0522   BUN 10 07/26/2018 0522   BUN 19 02/17/2018 1129   CREATININE 0.78 07/26/2018 0522   CREATININE 0.95 04/04/2015 1017   CALCIUM 9.3 07/26/2018 0522   GFRNONAA >60 07/26/2018 0522   GFRNONAA 62 04/04/2015 1017   GFRAA >60 07/26/2018 0522   GFRAA 71 04/04/2015 1017    INR    Component Value Date/Time   INR 1.12 07/28/2018 0304     Intake/Output Summary (Last 24 hours) at 07/28/2018 0738 Last data filed at 07/27/2018 2202 Gross per 24 hour  Intake 1130 ml  Output 100 ml  Net 1030 ml     Assessment/Plan:  72 y.o. female is s/p thrombolysis L fem-pop with angioplasty of distal anastomosis and native popliteal artery  2 Days Post-Op   Patent bypass with strong L PTA signal by doppler Patient unwilling to pay copay for Eliquis/Xarelto; pharmacy to continue bridge from IV heparin to coumadin D/c when INR therapeutic   Dagoberto Ligas, PA-C [facility] [phone] 07/28/2018 7:38 AM  Left PT pulse remains palpable but not as prominent as yesterday.  Awaiting theraputic INR for discharge. Follow up I office already scheduled.  Annamarie Major

## 2018-07-28 NOTE — Progress Notes (Signed)
ANTICOAGULATION CONSULT NOTE  Pharmacy Consult:  Heparin Indication: Occluded bypass graft   Labs: Recent Labs    07/25/18 0939 07/25/18 1027  07/26/18 0522  07/27/18 0346  07/27/18 1717 07/28/18 0304 07/28/18 0822  HGB 13.9  --    < > 11.5*  --  9.7*  --   --  10.3*  --   HCT 41.0  --    < > 37.6  --  31.6*  --   --  32.9*  --   PLT  --   --    < > 155  --  124*  --   --  124*  --   LABPROT  --  13.1  --   --   --   --   --   --  14.3  --   INR  --  1.00  --   --   --   --   --   --  1.12  --   HEPARINUNFRC  --   --    < > 0.22*   < >  --    < > 0.18* 0.38 0.40  CREATININE 0.90  --   --  0.78  --   --   --   --   --  0.84   < > = values in this interval not displayed.    Estimated Creatinine Clearance: 39.6 mL/min (by C-G formula based on SCr of 0.84 mg/dL).   Assessment: 65 YOF with occluded left femoral-popliteal bypass graft.  Coumadin from PTA was switched to IV heparin.  She was also receiving alteplase through the catheter, but now off post IR this AM.  Pharmacy consulted to resume IV heparin and  warfarin  Confirmatory heparin level today came back at 0.4, on 600 units/hr. INR is 1.12 after resuming warfarin last night. Hgb stable around 10.3, plt 124. No s/sx of bleeding or infusion issues per nursing.    Home regimen: 4 mg daily   Goal of Therapy:  INR 2 to 3 Heparin level 0.3-0.5 units/ml per MD Monitor platelets by anticoagulation protocol: Yes   Plan:  -Continue Heparin drip 600 units/hr (6 ml/hr) -Order warfarin 5 mg tonight  - Will continue to monitor for any signs/symptoms of bleeding.   Thank you for allowing pharmacy to be a part of this patient's care.  Doylene Canard, PharmD Clinical Pharmacist  Pager: [phone] Phone: [phone] Please check AMION for all [place] phone numbers After 10:00 PM, call [place] [phone] P9/13/2019 9:36 AM

## 2018-07-28 NOTE — Care Management Important Message (Signed)
Important Message  Patient Details  Name: Kristin Knight MRN: [id number] Date of Birth: May 05, 1946   Medicare Important Message Given:  Yes    Barb Merino Lynette Topete 07/28/2018, 11:27 AM

## 2018-07-28 NOTE — Progress Notes (Signed)
ANTICOAGULATION CONSULT NOTE  Pharmacy Consult:  Heparin Indication: Occluded bypass graft   Labs: Recent Labs    07/25/18 0939 07/25/18 1027  07/26/18 0522  07/27/18 0346 07/27/18 0806 07/27/18 1717 07/28/18 0304  HGB 13.9  --    < > 11.5*  --  9.7*  --   --  10.3*  HCT 41.0  --    < > 37.6  --  31.6*  --   --  32.9*  PLT  --   --    < > 155  --  124*  --   --  124*  LABPROT  --  13.1  --   --   --   --   --   --  14.3  INR  --  1.00  --   --   --   --   --   --  1.12  HEPARINUNFRC  --   --    < > 0.22*   < >  --  0.19* 0.18* 0.38  CREATININE 0.90  --   --  0.78  --   --   --   --   --    < > = values in this interval not displayed.    Estimated Creatinine Clearance: 41.5 mL/min (by C-G formula based on SCr of 0.78 mg/dL).   Assessment: 61 YOF with occluded left femoral-popliteal bypass graft.  Coumadin from PTA was switched to IV heparin.  She was also receiving alteplase through the catheter, but now off post IR this AM.  Pharmacy consulted to resume IV heparin and  warfarin  Heparin level = 0.38,  8 hours after heparin rate was increased to 600 units/hr ins is now therapeutic. No bleeding reported.   H/H low/stable and pltc remained at 124k same as yesterday.   Goal of Therapy:  Heparin level 0.3-0.5 units/ml per MD Monitor platelets by anticoagulation protocol: Yes  INR = 2 to 3  Plan:  -Continue Heparin drip  600 units/hr (6 ml/hr) -Check heparin level in 6 hours to confirm remains therapeutic.  - Will continue to monitor for any signs/symptoms of bleeding.   Thank you for allowing pharmacy to be a part of this patient's care.  Nicole Cella, RPh Clinical Pharmacist Please check AMION for all [place] phone numbers After 10:00 PM, call [place] [phone] P9/13/2019 3:59 AM

## 2018-07-28 NOTE — Progress Notes (Signed)
Pt has ambulated hall twice independently without tele alarm or complaint.

## 2018-07-28 NOTE — Discharge Instructions (Signed)
° °  [facility] [place] ° °Discharge Instructions ° °Lower Extremity Angiogram; Angioplasty/Stenting ° °Please refer to the following instructions for your post-procedure care. Your surgeon or physician assistant will discuss any changes with you. ° °Activity ° °Avoid lifting more than 8 pounds (1 gallons of milk) for 72 hours (3 days) after your procedure. You may walk as much as you can tolerate. It's OK to drive after 72 hours. ° °Bathing/Showering ° °You may shower the day after your procedure. If you have a bandage, you may remove it at 24- 48 hours. Clean your incision site with mild soap and water. Pat the area dry with a clean towel. ° °Diet ° °Resume your pre-procedure diet. There are no special food restrictions following this procedure. All patients with peripheral vascular disease should follow a low fat/low cholesterol diet. In order to heal from your surgery, it is CRITICAL to get adequate nutrition. Your body requires vitamins, minerals, and protein. Vegetables are the best source of vitamins and minerals. Vegetables also provide the perfect balance of protein. Processed food has little nutritional value, so try to avoid this. ° °Medications ° °Resume taking all of your medications unless your doctor tells you not to. If your incision is causing pain, you may take over-the-counter pain relievers such as acetaminophen (Tylenol) ° °Follow Up ° °Follow up will be arranged at the time of your procedure. You may have an office visit scheduled or may be scheduled for surgery. Ask your surgeon if you have any questions. ° °Please call us immediately for any of the following conditions: °•Severe or worsening pain your legs or feet at rest or with walking. °•Increased pain, redness, drainage at your groin puncture site. °•Fever of 101 degrees or higher. °•If you have any mild or slow bleeding from your puncture site: lie down, apply firm constant pressure over the area with a piece of  gauze or a clean wash cloth for 30 minutes- no peeking!, call 911 right away if you are still bleeding after 30 minutes, or if the bleeding is heavy and unmanageable. ° °Reduce your risk factors of vascular disease: ° °Stop smoking. If you would like help call [facility] at 1-800-QUIT-NOW (1-800-784-8669) or [place] at 336-586-4000. °Manage your cholesterol °Maintain a desired weight °Control your diabetes °Keep your blood pressure down ° °If you have any questions, please call the office at [phone] ° °

## 2018-07-29 LAB — PROTIME-INR
INR: 1.14
Prothrombin Time: 14.6 seconds (ref 11.4–15.2)

## 2018-07-29 LAB — CBC
HCT: 32 % — ABNORMAL LOW (ref 36.0–46.0)
Hemoglobin: 9.9 g/dL — ABNORMAL LOW (ref 12.0–15.0)
MCH: 29.3 pg (ref 26.0–34.0)
MCHC: 30.9 g/dL (ref 30.0–36.0)
MCV: 94.7 fL (ref 78.0–100.0)
PLATELETS: 130 10*3/uL — AB (ref 150–400)
RBC: 3.38 MIL/uL — AB (ref 3.87–5.11)
RDW: 14.1 % (ref 11.5–15.5)
WBC: 4.5 10*3/uL (ref 4.0–10.5)

## 2018-07-29 LAB — HEPARIN LEVEL (UNFRACTIONATED): HEPARIN UNFRACTIONATED: 0.32 [IU]/mL (ref 0.30–0.70)

## 2018-07-29 MED ORDER — WARFARIN SODIUM 5 MG PO TABS
5.0000 mg | ORAL_TABLET | Freq: Once | ORAL | Status: AC
  Administered 2018-07-29: 5 mg via ORAL
  Filled 2018-07-29: qty 1

## 2018-07-29 NOTE — Progress Notes (Signed)
Pt has ambulated unit multiple times independently without tele alarm or complaint.  Will con't plan of care.

## 2018-07-29 NOTE — Progress Notes (Addendum)
[facility] [place]  Subjective  - Doing well, ambulating in the halls.   Objective 117/76 (!) 53 98 F (36.7 C) (Oral) 18 100%  Intake/Output Summary (Last 24 hours) at 07/29/2018 0741 Last data filed at 07/28/2018 2000 Gross per 24 hour  Intake 1763.46 ml  Output -  Net 1763.46 ml    Right groin soft without hematoma Left PT brisk doppler signal, foot warm to touch sensation intact Heart RRR Lungs non labored breathing  Assessment/Planning: POD # 3 72 y.o. female is s/p thrombolysis L fem-pop with angioplasty of distal anastomosis and native popliteal artery   Continue to increase activity as tolerates Pending therapeutic INR prior to discharge INR 1.4 today  Roxy Horseman 07/29/2018 7:41 AM --  Laboratory Lab Results: Recent Labs    07/28/18 0304 07/29/18 0416  WBC 5.1 4.5  HGB 10.3* 9.9*  HCT 32.9* 32.0*  PLT 124* 130*   BMET Recent Labs    07/28/18 0822  NA 139  K 3.6  CL 105  CO2 27  GLUCOSE 129*  BUN 8  CREATININE 0.84  CALCIUM 8.9    COAG Lab Results  Component Value Date   INR 1.14 07/29/2018   INR 1.12 07/28/2018   INR 1.00 07/25/2018   No results found for: PTT  I have examined the patient, reviewed and agree with above.  Right groin puncture without hematoma.  Palpable left posterior tibial pulse.  Continue to mobilize.  Discharge when INR therapeutic  Curt Jews, MD 07/29/2018 10:16 AM

## 2018-07-29 NOTE — Progress Notes (Signed)
ANTICOAGULATION CONSULT NOTE  Pharmacy Consult:  Heparin Indication: Occluded bypass graft   Labs: Recent Labs    07/27/18 0346  07/28/18 0304 07/28/18 0822 07/29/18 0416  HGB 9.7*  --  10.3*  --  9.9*  HCT 31.6*  --  32.9*  --  32.0*  PLT 124*  --  124*  --  130*  LABPROT  --   --  14.3  --  14.6  INR  --   --  1.12  --  1.14  HEPARINUNFRC  --    < > 0.38 0.40 0.32  CREATININE  --   --   --  0.84  --    < > = values in this interval not displayed.    Estimated Creatinine Clearance: 39.6 mL/min (by C-G formula based on SCr of 0.84 mg/dL).   Assessment: 59 YOF with occluded left femoral-popliteal bypass graft.  Coumadin from PTA was switched to IV heparin.  She was also receiving alteplase through the catheter, but now off post IR this AM.  Pharmacy consulted to resume IV heparin and  warfarin  Heparin level today came back therapeutic at 0.32, on 600 units/hr. INR is 1.14 after resuming warfarin 9/12. Hgb stable around 9.9, plt 130. No s/sx of bleeding or infusion issues per nursing.    Home regimen: 4 mg daily   Goal of Therapy:  INR 2 to 3 Heparin level 0.3-0.5 units/ml per MD Monitor platelets by anticoagulation protocol: Yes   Plan:  -Continue Heparin drip 600 units/hr (6 ml/hr) -Order warfarin 5 mg tonight  - Will continue to monitor for any signs/symptoms of bleeding.   Thank you for allowing pharmacy to be a part of this patient's care.  Doylene Canard, PharmD Clinical Pharmacist  Pager: [phone] Phone: [phone] Please check AMION for all [place] phone numbers After 10:00 PM, call [place] [phone] P9/14/2019 10:09 AM

## 2018-07-30 LAB — BASIC METABOLIC PANEL
Anion gap: 7 (ref 5–15)
BUN: 13 mg/dL (ref 8–23)
CO2: 30 mmol/L (ref 22–32)
Calcium: 9.3 mg/dL (ref 8.9–10.3)
Chloride: 103 mmol/L (ref 98–111)
Creatinine, Ser: 0.81 mg/dL (ref 0.44–1.00)
GFR calc Af Amer: >60 mL/min (ref >60)
GLUCOSE: 90 mg/dL (ref 70–99)
POTASSIUM: 4.4 mmol/L (ref 3.5–5.1)
Sodium: 140 mmol/L (ref 135–145)

## 2018-07-30 LAB — PROTIME-INR
INR: 1.2
Prothrombin Time: 15.1 seconds (ref 11.4–15.2)

## 2018-07-30 LAB — CBC
HCT: 31.6 % — ABNORMAL LOW (ref 36.0–46.0)
Hemoglobin: 9.7 g/dL — ABNORMAL LOW (ref 12.0–15.0)
MCH: 28.9 pg (ref 26.0–34.0)
MCHC: 30.7 g/dL (ref 30.0–36.0)
MCV: 94 fL (ref 78.0–100.0)
PLATELETS: 145 10*3/uL — AB (ref 150–400)
RBC: 3.36 MIL/uL — ABNORMAL LOW (ref 3.87–5.11)
RDW: 14.3 % (ref 11.5–15.5)
WBC: 4.6 10*3/uL (ref 4.0–10.5)

## 2018-07-30 LAB — HEPARIN LEVEL (UNFRACTIONATED)
HEPARIN UNFRACTIONATED: 0.26 [IU]/mL — AB (ref 0.30–0.70)
Heparin Unfractionated: 0.53 IU/mL (ref 0.30–0.70)

## 2018-07-30 MED ORDER — HEPARIN (PORCINE) IN NACL 100-0.45 UNIT/ML-% IJ SOLN
650.0000 [IU]/h | INTRAMUSCULAR | Status: DC
  Administered 2018-07-30 – 2018-07-31 (×2): 650 [IU]/h via INTRAVENOUS
  Filled 2018-07-30 (×3): qty 250

## 2018-07-30 MED ORDER — WARFARIN SODIUM 5 MG PO TABS
5.0000 mg | ORAL_TABLET | Freq: Once | ORAL | Status: AC
  Administered 2018-07-30: 5 mg via ORAL
  Filled 2018-07-30: qty 1

## 2018-07-30 NOTE — Progress Notes (Signed)
ANTICOAGULATION CONSULT NOTE  Pharmacy Consult:  Heparin Indication: Occluded bypass graft   Patient Measurements: Height: 5\' 4"  (162.6 cm) Weight: 90 lb (40.8 kg) IBW/kg (Calculated) : 54.7 Heparin Dosing Weight: 40.8 kg   Labs: Recent Labs    07/28/18 0304 07/28/18 0822 07/29/18 0416 07/30/18 0417 07/30/18 1200  HGB 10.3*  --  9.9* 9.7*  --   HCT 32.9*  --  32.0* 31.6*  --   PLT 124*  --  130* 145*  --   LABPROT 14.3  --  14.6 15.1  --   INR 1.12  --  1.14 1.20  --   HEPARINUNFRC 0.38 0.40 0.32 0.26* 0.53  CREATININE  --  0.84  --  0.81  --     Estimated Creatinine Clearance: 41 mL/min (by C-G formula based on SCr of 0.81 mg/dL).   Assessment: 37 YOF with occluded left femoral-popliteal bypass graft.  Coumadin from PTA was switched to IV heparin.  She was also receiving alteplase through the catheter, but now off post IR this AM.  Pharmacy consulted to resume IV heparin and  warfarin  Heparin level after rate increase came back slightly above goal range at 0.53, on 650 units/hr. INR is 1.2 after resuming warfarin 9/12. Hgb stable around 9.7, plt 145. No s/sx of bleeding or infusion issues per nursing.    Heparin level was subtherapeutic at 600 units/hr - will continue at same rate despite being slightly over goal since no s/sx of bleeding.   Home regimen: 4 mg daily   Goal of Therapy:  INR 2 to 3 Heparin level 0.3-0.5 units/ml per MD Monitor platelets by anticoagulation protocol: Yes   Plan:  -Continue heparin infusion 650 units/hr (6.5 ml/hr)  -Order warfarin 5 mg tonight  - Will continue to monitor for any signs/symptoms of bleeding.   Thank you for allowing pharmacy to be a part of this patient's care. Doylene Canard, PharmD Clinical Pharmacist  Pager: [phone] Phone: [phone] 07/30/2018,12:44 PM

## 2018-07-30 NOTE — Progress Notes (Signed)
ANTICOAGULATION CONSULT NOTE  Pharmacy Consult:  Heparin Indication: Occluded bypass graft   Patient Measurements: Height: 5\' 4"  (162.6 cm) Weight: 90 lb (40.8 kg) IBW/kg (Calculated) : 54.7 Heparin Dosing Weight: 40.8 kg   Labs: Recent Labs    07/28/18 0304 07/28/18 0822 07/29/18 0416 07/30/18 0417  HGB 10.3*  --  9.9* 9.7*  HCT 32.9*  --  32.0* 31.6*  PLT 124*  --  130* 145*  LABPROT 14.3  --  14.6 15.1  INR 1.12  --  1.14 1.20  HEPARINUNFRC 0.38 0.40 0.32 0.26*  CREATININE  --  0.84  --   --     Estimated Creatinine Clearance: 39.6 mL/min (by C-G formula based on SCr of 0.84 mg/dL).   Assessment: 10 YOF with occluded left femoral-popliteal bypass graft.  Coumadin from PTA was switched to IV heparin.  She was also receiving alteplase through the catheter, but now off post IR this AM.  Pharmacy consulted to resume IV heparin and  warfarin  Heparin level today = 0.26 on 600 units/hr. INR is 1.2 after resuming warfarin 9/12. Hgb stable around 9.7, plt 145. No s/sx of bleeding or infusion issues per nursing.    Home regimen: 4 mg daily   Goal of Therapy:  INR 2 to 3 Heparin level 0.3-0.5 units/ml per MD Monitor platelets by anticoagulation protocol: Yes   Plan:  -Increase Heparin drip 650 units/hr (6.5 ml/hr) -Order warfarin 5 mg tonight  - Will continue to monitor for any signs/symptoms of bleeding.   Thank you for allowing pharmacy to be a part of this patient's care. Nicole Cella, RPh Clinical Pharmacist Pager: [phone] Please check AMION for all [facility] phone numbers After 10:00 PM, call [place] [phone] 07/30/2018 5:40 AM   07/30/2018,5:41 AM

## 2018-07-30 NOTE — Progress Notes (Signed)
Subjective: Interval History: none.. Comfortable.  Has been up walking without difficulty  Objective: Vital signs in last 24 hours: Temp:  [97.9 F (36.6 C)-98.2 F (36.8 C)] 97.9 F (36.6 C) (09/15 0441) Pulse Rate:  [52-54] 54 (09/15 0441) Resp:  [16-20] 16 (09/15 0441) BP: (101-121)/(67-82) 115/67 (09/15 0441) SpO2:  [98 %-100 %] 100 % (09/15 0441)  Intake/Output from previous day: 09/14 0701 - 09/15 0700 In: 2940.6 [P.O.:840; I.V.:2100.6] Out: -  Intake/Output this shift: No intake/output data recorded.  Easily palpable popliteal pulse.  Foot well-perfused.  Lab Results: Recent Labs    07/29/18 0416 07/30/18 0417  WBC 4.5 4.6  HGB 9.9* 9.7*  HCT 32.0* 31.6*  PLT 130* 145*   BMET Recent Labs    07/28/18 0822 07/30/18 0417  NA 139 140  K 3.6 4.4  CL 105 103  CO2 27 30  GLUCOSE 129* 90  BUN 8 13  CREATININE 0.84 0.81  CALCIUM 8.9 9.3    Studies/Results: No results found. Anti-infectives: Anti-infectives (From admission, onward)   None      Assessment/Plan: s/p Procedure(s) with comments: LOWER EXTREMITY ANGIOGRAPHY - LYSIS RECHECK (Bilateral) PERIPHERAL VASCULAR BALLOON ANGIOPLASTY (Left) - Fem pop bypass Stable on heparin drip.  INR remains subtherapeutic.  And appropriately therapeutic on Coumadin   LOS: 5 days   Kristin Knight 07/30/2018, 8:08 AM

## 2018-07-31 LAB — CBC
HEMATOCRIT: 31.9 % — AB (ref 36.0–46.0)
Hemoglobin: 9.8 g/dL — ABNORMAL LOW (ref 12.0–15.0)
MCH: 28.8 pg (ref 26.0–34.0)
MCHC: 30.7 g/dL (ref 30.0–36.0)
MCV: 93.8 fL (ref 78.0–100.0)
PLATELETS: 160 10*3/uL (ref 150–400)
RBC: 3.4 MIL/uL — ABNORMAL LOW (ref 3.87–5.11)
RDW: 14.5 % (ref 11.5–15.5)
WBC: 4 10*3/uL (ref 4.0–10.5)

## 2018-07-31 LAB — PROTIME-INR
INR: 1.24
Prothrombin Time: 15.5 seconds — ABNORMAL HIGH (ref 11.4–15.2)

## 2018-07-31 LAB — HEPARIN LEVEL (UNFRACTIONATED)
HEPARIN UNFRACTIONATED: 0.65 [IU]/mL (ref 0.30–0.70)
HEPARIN UNFRACTIONATED: 0.45 [IU]/mL (ref 0.30–0.70)
Heparin Unfractionated: 0.48 IU/mL (ref 0.30–0.70)

## 2018-07-31 MED ORDER — WARFARIN SODIUM 3 MG PO TABS
6.0000 mg | ORAL_TABLET | Freq: Once | ORAL | Status: AC
  Administered 2018-07-31: 6 mg via ORAL
  Filled 2018-07-31: qty 2

## 2018-07-31 NOTE — Progress Notes (Addendum)
ANTICOAGULATION CONSULT NOTE  Pharmacy Consult:  Heparin Indication: Occluded bypass graft   Patient Measurements: Height: 5\' 4"  (162.6 cm) Weight: 90 lb (40.8 kg) IBW/kg (Calculated) : 54.7 Heparin Dosing Weight: 40.8 kg   Labs: Recent Labs    07/28/18 0822  07/29/18 0416 07/30/18 0417 07/30/18 1200 07/31/18 0452  HGB  --    < > 9.9* 9.7*  --  9.8*  HCT  --   --  32.0* 31.6*  --  31.9*  PLT  --   --  130* 145*  --  160  LABPROT  --   --  14.6 15.1  --  15.5*  INR  --   --  1.14 1.20  --  1.24  HEPARINUNFRC 0.40  --  0.32 0.26* 0.53 0.48  CREATININE 0.84  --   --  0.81  --   --    < > = values in this interval not displayed.    Estimated Creatinine Clearance: 41 mL/min (by C-G formula based on SCr of 0.81 mg/dL).   Assessment: Kristin Knight with occluded left femoral-popliteal bypass graft.  Coumadin from PTA was switched to IV heparin.  She was also receiving alteplase through the catheter, but now off post IR. Pharmacy consulted to resume IV heparin and  warfarin  Heparin level came back within goal range at 0.48, on 650 units/hr. INR is 1.24 after resuming warfarin 9/12. Will increase by 20% to 6mg  tonight. Hgb stable around 9.8, plt 160. No s/sx of bleeding or infusion issues per nursing.       Home regimen: 4 mg daily   Goal of Therapy:  INR 2 to 3 Heparin level 0.3-0.5 units/ml per MD Monitor platelets by anticoagulation protocol: Yes   Plan:  -Continue heparin infusion 650 units/hr (6.5 ml/hr)  -Order warfarin 6 mg tonight  - Will continue to monitor for any signs/symptoms of bleeding.   Shaneya Taketa A. Levada Dy, PharmD, [place] Pager: [phone] Please utilize Amion for appropriate phone number to reach the unit pharmacist ([place])  Addendum:  Confirmatory heparin level returned high at 0.65. Will decrease heparin rate to 600 units/hr. Heparin level in 8 hours.    Jaidyn Usery A. Levada Dy, PharmD, [place] Pager: [phone] Please utilize Amion for appropriate phone number to reach the unit pharmacist ([place])    07/31/2018,7:54 AM

## 2018-07-31 NOTE — Progress Notes (Signed)
Patient ambulated in hallway independently this AM. [place], Bettina Gavia RN

## 2018-07-31 NOTE — Progress Notes (Signed)
ANTICOAGULATION CONSULT NOTE  Pharmacy Consult:  Heparin Indication: Occluded bypass graft   Patient Measurements: Height: 5\' 4"  (162.6 cm) Weight: 90 lb (40.8 kg) IBW/kg (Calculated) : 54.7 Heparin Dosing Weight: 40.8 kg   Labs: Recent Labs    07/29/18 0416 07/30/18 0417  07/31/18 0452 07/31/18 1235 07/31/18 2146  HGB 9.9* 9.7*  --  9.8*  --   --   HCT 32.0* 31.6*  --  31.9*  --   --   PLT 130* 145*  --  160  --   --   LABPROT 14.6 15.1  --  15.5*  --   --   INR 1.14 1.20  --  1.24  --   --   HEPARINUNFRC 0.32 0.26*   < > 0.48 0.65 0.45  CREATININE  --  0.81  --   --   --   --    < > = values in this interval not displayed.    Estimated Creatinine Clearance: 41 mL/min (by C-G formula based on SCr of 0.81 mg/dL).   Assessment: 71 YOF with occluded left femoral-popliteal bypass graft.  Coumadin from PTA was switched to IV heparin.  She was also receiving alteplase through the catheter, but now off post IR. Pharmacy consulted to resume IV heparin and  warfarin  Heparin level 0.45 on heparin 600 units/hr  No s/sx of bleeding reported.     Home regimen: 4 mg daily   Goal of Therapy:  INR 2 to 3 Heparin level 0.3-0.5 units/ml per MD Monitor platelets by anticoagulation protocol: Yes   Plan:  -Continue heparin infusion 600 units/hr (6 ml/hr)  Daily HL, CBC - Will continue to monitor for any signs/symptoms of bleeding.   Nicole Cella, RPh Clinical Pharmacist Please utilize Amion for appropriate phone number to reach the unit pharmacist ([place]) 07/31/2018,10:34 PM

## 2018-07-31 NOTE — Progress Notes (Addendum)
  Progress Note    07/31/2018 7:47 AM 5 Days Post-Op  Subjective:  No new complaints   Vitals:   07/30/18 2133 07/31/18 0500  BP: 133/79 119/68  Pulse:    Resp:  16  Temp:  98 F (36.7 C)  SpO2:  100%   Physical Exam: Lungs:  Non labored Extremities:  Palpable L popliteal pulse; L foot warm to touch with good cap refill Abdomen:  Soft Neurologic: A&O  CBC    Component Value Date/Time   WBC 4.0 07/31/2018 0452   RBC 3.40 (L) 07/31/2018 0452   HGB 9.8 (L) 07/31/2018 0452   HCT 31.9 (L) 07/31/2018 0452   PLT 160 07/31/2018 0452   MCV 93.8 07/31/2018 0452   MCH 28.8 07/31/2018 0452   MCHC 30.7 07/31/2018 0452   RDW 14.5 07/31/2018 0452   LYMPHSABS 2.1 07/17/2018 1201   MONOABS 0.5 07/17/2018 1201   EOSABS 0.1 07/17/2018 1201   BASOSABS 0.0 07/17/2018 1201    BMET    Component Value Date/Time   NA 140 07/30/2018 0417   NA 140 02/17/2018 1129   K 4.4 07/30/2018 0417   CL 103 07/30/2018 0417   CO2 30 07/30/2018 0417   GLUCOSE 90 07/30/2018 0417   BUN 13 07/30/2018 0417   BUN 19 02/17/2018 1129   CREATININE 0.81 07/30/2018 0417   CREATININE 0.95 04/04/2015 1017   CALCIUM 9.3 07/30/2018 0417   GFRNONAA >60 07/30/2018 0417   GFRNONAA 62 04/04/2015 1017   GFRAA >60 07/30/2018 0417   GFRAA 71 04/04/2015 1017    INR    Component Value Date/Time   INR 1.24 07/31/2018 0452     Intake/Output Summary (Last 24 hours) at 07/31/2018 0747 Last data filed at 07/31/2018 0500 Gross per 24 hour  Intake 1575.11 ml  Output -  Net 1575.11 ml     Assessment/Plan:  72 y.o. female is s/p thrombolysis L fem-pop with angioplasty of distal anastomosis and native popliteal artery   5 Days Post-Op   Perfusing LLE well Pharmacy bridging from heparin to coumadin D/c when INR therapeutic   Dagoberto Ligas, PA-C [facility] [phone] 07/31/2018 7:47 AM  I agree with the above.  She is ambulating without difficulty.  She has a palpable left  popliteal pulse.  I could not palpate a posterior tibial pulse.  We are waiting for therapeutic Coumadin levels before discharge  Wells Brabham

## 2018-08-01 ENCOUNTER — Encounter: Payer: Self-pay | Admitting: Pharmacist

## 2018-08-01 LAB — CBC
HCT: 32.8 % — ABNORMAL LOW (ref 36.0–46.0)
Hemoglobin: 10.1 g/dL — ABNORMAL LOW (ref 12.0–15.0)
MCH: 29 pg (ref 26.0–34.0)
MCHC: 30.8 g/dL (ref 30.0–36.0)
MCV: 94.3 fL (ref 78.0–100.0)
PLATELETS: 189 10*3/uL (ref 150–400)
RBC: 3.48 MIL/uL — AB (ref 3.87–5.11)
RDW: 14.7 % (ref 11.5–15.5)
WBC: 4.2 10*3/uL (ref 4.0–10.5)

## 2018-08-01 LAB — PROTIME-INR
INR: 1.25
Prothrombin Time: 15.6 seconds — ABNORMAL HIGH (ref 11.4–15.2)

## 2018-08-01 LAB — HEPARIN LEVEL (UNFRACTIONATED): Heparin Unfractionated: 0.35 IU/mL (ref 0.30–0.70)

## 2018-08-01 MED ORDER — WARFARIN SODIUM 10 MG PO TABS
10.0000 mg | ORAL_TABLET | Freq: Once | ORAL | Status: AC
  Administered 2018-08-01: 10 mg via ORAL
  Filled 2018-08-01: qty 1

## 2018-08-01 NOTE — Progress Notes (Signed)
Worked with patient to apply for Medicare extra help in order to provide assistance with cost of brand medications such as DOACs.

## 2018-08-01 NOTE — Progress Notes (Addendum)
  Progress Note    08/01/2018 7:39 AM 6 Days Post-Op  Subjective:  No new complaints   Vitals:   07/31/18 1946 08/01/18 0519  BP: 106/65 108/66  Pulse: 60 (!) 53  Resp: 20 19  Temp: 98.1 F (36.7 C) 97.8 F (36.6 C)  SpO2: 100% 100%   Physical Exam: Lungs:  Non labored Extremities:  L foot warm to touch with good cap refill Abdomen:  Soft Neurologic: A&O  CBC    Component Value Date/Time   WBC 4.2 08/01/2018 0325   RBC 3.48 (L) 08/01/2018 0325   HGB 10.1 (L) 08/01/2018 0325   HCT 32.8 (L) 08/01/2018 0325   PLT 189 08/01/2018 0325   MCV 94.3 08/01/2018 0325   MCH 29.0 08/01/2018 0325   MCHC 30.8 08/01/2018 0325   RDW 14.7 08/01/2018 0325   LYMPHSABS 2.1 07/17/2018 1201   MONOABS 0.5 07/17/2018 1201   EOSABS 0.1 07/17/2018 1201   BASOSABS 0.0 07/17/2018 1201    BMET    Component Value Date/Time   NA 140 07/30/2018 0417   NA 140 02/17/2018 1129   K 4.4 07/30/2018 0417   CL 103 07/30/2018 0417   CO2 30 07/30/2018 0417   GLUCOSE 90 07/30/2018 0417   BUN 13 07/30/2018 0417   BUN 19 02/17/2018 1129   CREATININE 0.81 07/30/2018 0417   CREATININE 0.95 04/04/2015 1017   CALCIUM 9.3 07/30/2018 0417   GFRNONAA >60 07/30/2018 0417   GFRNONAA 62 04/04/2015 1017   GFRAA >60 07/30/2018 0417   GFRAA 71 04/04/2015 1017    INR    Component Value Date/Time   INR 1.25 08/01/2018 0325     Intake/Output Summary (Last 24 hours) at 08/01/2018 0740 Last data filed at 08/01/2018 3235 Gross per 24 hour  Intake 1239.87 ml  Output -  Net 1239.87 ml     Assessment/Plan:  72 y.o. female is s/p thrombolysis L fem-pop with angioplasty of distal anastomosis and native popliteal artery  6 Days Post-Op   Perfusing LLE INR still not therapeutic; will increase coumadin dose tonight D/c when INR therapeutic   Dagoberto Ligas, PA-C [facility] [phone] 08/01/2018 7:40 AM

## 2018-08-01 NOTE — Progress Notes (Signed)
ANTICOAGULATION CONSULT NOTE  Pharmacy Consult:  Heparin Indication: Occluded bypass graft   Patient Measurements: Height: 5\' 4"  (162.6 cm) Weight: 90 lb (40.8 kg) IBW/kg (Calculated) : 54.7 Heparin Dosing Weight: 40.8 kg   Labs: Recent Labs    07/30/18 0417  07/31/18 0452 07/31/18 1235 07/31/18 2146 08/01/18 0325  HGB 9.7*  --  9.8*  --   --  10.1*  HCT 31.6*  --  31.9*  --   --  32.8*  PLT 145*  --  160  --   --  189  LABPROT 15.1  --  15.5*  --   --  15.6*  INR 1.20  --  1.24  --   --  1.25  HEPARINUNFRC 0.26*   < > 0.48 0.65 0.45 0.35  CREATININE 0.81  --   --   --   --   --    < > = values in this interval not displayed.    Estimated Creatinine Clearance: 41 mL/min (by C-G formula based on SCr of 0.81 mg/dL).   Assessment: 74 YOF with occluded left femoral-popliteal bypass graft.  Coumadin from PTA was switched to IV heparin.  She was also receiving alteplase through the catheter, but now off post IR. Pharmacy consulted to resume IV heparin and  warfarin  Heparin level came back within goal range at 0.35, on 600 units/hr. INR is 1.25 after resuming warfarin 9/12. PA for primary team has increased warfarin to 10mg  for tonight.  Hgb stable around 10.1, plt 189. No s/sx of bleeding or infusion issues per nursing.       Home regimen: 4 mg daily   Goal of Therapy:  INR 2 to 3 Heparin level 0.3-0.5 units/ml per MD Monitor platelets by anticoagulation protocol: Yes   Plan:  -Continue heparin infusion 600 units/hr (6 ml/hr)  -Warfarin 10mg  PO x 1 tonight per primary team order - Will continue to monitor for any signs/symptoms of bleeding.   Harlon Kutner A. Levada Dy, PharmD, [place] Pager: [phone] Please utilize Amion for appropriate phone number to reach the unit pharmacist ([place])  08/01/2018,7:48 AM

## 2018-08-02 LAB — CBC
HCT: 33 % — ABNORMAL LOW (ref 36.0–46.0)
Hemoglobin: 10.2 g/dL — ABNORMAL LOW (ref 12.0–15.0)
MCH: 29 pg (ref 26.0–34.0)
MCHC: 30.9 g/dL (ref 30.0–36.0)
MCV: 93.8 fL (ref 78.0–100.0)
Platelets: 200 10*3/uL (ref 150–400)
RBC: 3.52 MIL/uL — AB (ref 3.87–5.11)
RDW: 15.2 % (ref 11.5–15.5)
WBC: 4.3 10*3/uL (ref 4.0–10.5)

## 2018-08-02 LAB — PROTIME-INR
INR: 1.51
PROTHROMBIN TIME: 18.1 s — AB (ref 11.4–15.2)

## 2018-08-02 LAB — HEPARIN LEVEL (UNFRACTIONATED)
HEPARIN UNFRACTIONATED: 0.29 [IU]/mL — AB (ref 0.30–0.70)
HEPARIN UNFRACTIONATED: 0.55 [IU]/mL (ref 0.30–0.70)

## 2018-08-02 MED ORDER — WARFARIN SODIUM 7.5 MG PO TABS
7.5000 mg | ORAL_TABLET | Freq: Once | ORAL | Status: AC
  Administered 2018-08-02: 7.5 mg via ORAL
  Filled 2018-08-02: qty 1

## 2018-08-02 NOTE — Progress Notes (Signed)
    Subjective  - POD #7  No complaints   Physical Exam:  Palpable popliteal pulse       Assessment/Plan:  POD #7  Awaiting theraputic INR.  Given 10 mg yesterday  Wells Trayven Lumadue 08/02/2018 8:26 AM --  Vitals:   08/02/18 0450 08/02/18 0804  BP: (!) 105/54 108/73  Pulse: (!) 56   Resp: 15 16  Temp: 97.9 F (36.6 C)   SpO2: 100%     Intake/Output Summary (Last 24 hours) at 08/02/2018 0826 Last data filed at 08/02/2018 0808 Gross per 24 hour  Intake 2416.58 ml  Output -  Net 2416.58 ml     Laboratory CBC    Component Value Date/Time   WBC 4.3 08/02/2018 0447   HGB 10.2 (L) 08/02/2018 0447   HCT 33.0 (L) 08/02/2018 0447   PLT 200 08/02/2018 0447    BMET    Component Value Date/Time   NA 140 07/30/2018 0417   NA 140 02/17/2018 1129   K 4.4 07/30/2018 0417   CL 103 07/30/2018 0417   CO2 30 07/30/2018 0417   GLUCOSE 90 07/30/2018 0417   BUN 13 07/30/2018 0417   BUN 19 02/17/2018 1129   CREATININE 0.81 07/30/2018 0417   CREATININE 0.95 04/04/2015 1017   CALCIUM 9.3 07/30/2018 0417   GFRNONAA >60 07/30/2018 0417   GFRNONAA 62 04/04/2015 1017   GFRAA >60 07/30/2018 0417   GFRAA 71 04/04/2015 1017    COAG Lab Results  Component Value Date   INR 1.51 08/02/2018   INR 1.25 08/01/2018   INR 1.24 07/31/2018   No results found for: PTT  Antibiotics Anti-infectives (From admission, onward)   None       V. Leia Alf, M.D. [facility] Office: [phone] Pager:  [phone]

## 2018-08-02 NOTE — Progress Notes (Signed)
ANTICOAGULATION CONSULT NOTE  Pharmacy Consult:  Heparin Indication: Occluded bypass graft  Patient Measurements: Height: 5\' 4"  (162.6 cm) Weight: 90 lb (40.8 kg) IBW/kg (Calculated) : 54.7 Heparin Dosing Weight: 40.8 kg   Labs: Recent Labs    07/31/18 0452  08/01/18 0325 08/02/18 0447 08/02/18 1618  HGB 9.8*  --  10.1* 10.2*  --   HCT 31.9*  --  32.8* 33.0*  --   PLT 160  --  189 200  --   LABPROT 15.5*  --  15.6* 18.1*  --   INR 1.24  --  1.25 1.51  --   HEPARINUNFRC 0.48   < > 0.35 0.29* 0.55   < > = values in this interval not displayed.    Estimated Creatinine Clearance: 45.4 mL/min (by C-G formula based on SCr of 0.81 mg/dL).   Assessment: 53 YOF with occluded left femoral-popliteal bypass graft.  Coumadin from PTA was switched to IV heparin.  She was also receiving alteplase through the catheter, but now off post IR. Pharmacy consulted to resume IV heparin and warfarin  Heparin level is slightly supra-therapeutic this afternoon with a slight increase in heparin rate.  Instead of adjusting down again by 50 units/hr (smallest increment for adjustment), will continue at current rate and monitor closely.  No bleeding reported.   Goal of Therapy:  Heparin level 0.3-0.5 units/ml per MD Monitor platelets by anticoagulation protocol: Yes    Plan:  Continue heparin gtt at 650 units/hr and monitor closely for s/sx of bleeding F/U AM labs   Ambrosia Wisnewski D. Mina Marble, PharmD, BCPS, [place] 08/02/2018, 5:25 PM

## 2018-08-02 NOTE — Progress Notes (Signed)
ANTICOAGULATION CONSULT NOTE  Pharmacy Consult:  Heparin Indication: Occluded bypass graft  Patient Measurements: Height: 5\' 4"  (162.6 cm) Weight: 90 lb (40.8 kg) IBW/kg (Calculated) : 54.7 Heparin Dosing Weight: 40.8 kg   Labs: Recent Labs    07/31/18 0452  07/31/18 2146 08/01/18 0325 08/02/18 0447  HGB 9.8*  --   --  10.1* 10.2*  HCT 31.9*  --   --  32.8* 33.0*  PLT 160  --   --  189 200  LABPROT 15.5*  --   --  15.6* 18.1*  INR 1.24  --   --  1.25 1.51  HEPARINUNFRC 0.48   < > 0.45 0.35 0.29*   < > = values in this interval not displayed.    Estimated Creatinine Clearance: 45.4 mL/min (by C-G formula based on SCr of 0.81 mg/dL).   Assessment: 89 YOF with occluded left femoral-popliteal bypass graft.  Coumadin from PTA was switched to IV heparin.  She was also receiving alteplase through the catheter, but now off post IR. Pharmacy consulted to resume IV heparin and warfarin  Heparin level this morning is slightly SUBtherapeutic (HL 0.29 << 0.35, goal of 0.3-0.5). INR today remains SUBtherapeutic though trending up (INR 1.51 << 1.25, goal of 2-3).   It is noted that the PA for [facility] increased the warfarin dose to 10 mg on 9/17. It is likely that the full effects of this high dose are still yet to be seen and would expect the INR to still be responding to this over the next 1-2 days. The patient's PTA dose is 4 mg daily, I would not recommend repeating a 10 mg dose this evening but will stay aggressive and dose at 7.5 mg in the hopes to get the patient to goal faster for discharge.   Goal of Therapy:  INR 2 to 3 Heparin level 0.3-0.5 units/ml per MD Monitor platelets by anticoagulation protocol: Yes   Plan:  - Increase Heparin to 650 units/hr  - Warfarin 7.5 mg x 1 dose at 1800 today - Will continue to monitor for any signs/symptoms of bleeding and will follow up with heparin level in 8 hours and PT/INR in the AM  Thank you for allowing pharmacy to be a part of this  patient's care.  Alycia Rossetti, PharmD, BCPS Clinical Pharmacist Pager: [phone] Clinical phone for 08/02/2018 from 7a-3:30p: [phone] If after 3:30p, please call main pharmacy at: [phone] Please check AMION for all [place] numbers 08/02/2018 8:40 AM

## 2018-08-02 NOTE — Progress Notes (Signed)
Patient ambulated in hallway independently. Phenix Grein Jessup RN  

## 2018-08-03 LAB — PROTIME-INR
INR: 2.03
Prothrombin Time: 22.8 seconds — ABNORMAL HIGH (ref 11.4–15.2)

## 2018-08-03 LAB — CBC
HEMATOCRIT: 34.7 % — AB (ref 36.0–46.0)
Hemoglobin: 10.7 g/dL — ABNORMAL LOW (ref 12.0–15.0)
MCH: 29.3 pg (ref 26.0–34.0)
MCHC: 30.8 g/dL (ref 30.0–36.0)
MCV: 95.1 fL (ref 78.0–100.0)
Platelets: 215 10*3/uL (ref 150–400)
RBC: 3.65 MIL/uL — ABNORMAL LOW (ref 3.87–5.11)
RDW: 15.1 % (ref 11.5–15.5)
WBC: 4.9 10*3/uL (ref 4.0–10.5)

## 2018-08-03 LAB — HEPARIN LEVEL (UNFRACTIONATED): Heparin Unfractionated: 0.45 IU/mL (ref 0.30–0.70)

## 2018-08-03 MED ORDER — CLOPIDOGREL BISULFATE 75 MG PO TABS: 75.0000 mg | ORAL_TABLET | Freq: Every day | ORAL | 1 refills | Status: DC

## 2018-08-03 NOTE — Progress Notes (Addendum)
  Progress Note    08/03/2018 7:21 AM 8 Days Post-Op  Subjective:  No new complaints   Vitals:   08/02/18 1937 08/03/18 0337  BP: 104/76 (!) 85/49  Pulse: 61 (!) 54  Resp: 18 19  Temp: 98 F (36.7 C) 97.9 F (36.6 C)  SpO2: 100% 100%   Physical Exam: Lungs:  Non labored Extremities:  L PT multiphasic by doppler Abdomen:  Soft Neurologic: A&O  CBC    Component Value Date/Time   WBC 4.9 08/03/2018 0336   RBC 3.65 (L) 08/03/2018 0336   HGB 10.7 (L) 08/03/2018 0336   HCT 34.7 (L) 08/03/2018 0336   PLT 215 08/03/2018 0336   MCV 95.1 08/03/2018 0336   MCH 29.3 08/03/2018 0336   MCHC 30.8 08/03/2018 0336   RDW 15.1 08/03/2018 0336   LYMPHSABS 2.1 07/17/2018 1201   MONOABS 0.5 07/17/2018 1201   EOSABS 0.1 07/17/2018 1201   BASOSABS 0.0 07/17/2018 1201    BMET    Component Value Date/Time   NA 140 07/30/2018 0417   NA 140 02/17/2018 1129   K 4.4 07/30/2018 0417   CL 103 07/30/2018 0417   CO2 30 07/30/2018 0417   GLUCOSE 90 07/30/2018 0417   BUN 13 07/30/2018 0417   BUN 19 02/17/2018 1129   CREATININE 0.81 07/30/2018 0417   CREATININE 0.95 04/04/2015 1017   CALCIUM 9.3 07/30/2018 0417   GFRNONAA >60 07/30/2018 0417   GFRNONAA 62 04/04/2015 1017   GFRAA >60 07/30/2018 0417   GFRAA 71 04/04/2015 1017    INR    Component Value Date/Time   INR 2.03 08/03/2018 0336     Intake/Output Summary (Last 24 hours) at 08/03/2018 0721 Last data filed at 08/03/2018 0600 Gross per 24 hour  Intake 2322.01 ml  Output -  Net 2322.01 ml     Assessment/Plan:  72 y.o. female is s/p thrombolysis L fem-pop with angioplasty of distal anastomosis and native popliteal artery 8 Days Post-Op   INR therapeutic Perfusing LLE well with L PTA by doppler Patient has follow up with coumadin clinic Our office will call to arrange follow up  Dagoberto Ligas, PA-C [facility] [phone] 08/03/2018 7:21 AM  D/c today  Annamarie Major

## 2018-08-03 NOTE — Care Management Note (Signed)
Case Management Note Marvetta Gibbons RN, BSN Unit 4E- RN Care Coordinator  [phone]   Patient Details  Name: Kristin Knight MRN: [id number] Date of Birth: Oct 11, 1946  Subjective/Objective:   Pt admitted with occlusion of femoropopliteal bypass graft, s/p thrombolysis L fempop with angioplasty.                  Action/Plan: PTA pt lived at home, coumadin bridge as pt did not want to pay cost of doac. No further CM needs noted for transition home.   Expected Discharge Date:  08/03/18               Expected Discharge Plan:  Home/Self Care  In-House Referral:  NA  Discharge planning Services  CM Consult  Post Acute Care Choice:  NA Choice offered to:  NA  DME Arranged:    DME Agency:     HH Arranged:    HH Agency:     Status of Service:  Completed, signed off  If discussed at [place] of Stay Meetings, dates discussed:    Discharge Disposition: home/self care   Additional Comments:  Dawayne Patricia, RN 08/03/2018, 10:44 AM

## 2018-08-03 NOTE — Progress Notes (Signed)
ANTICOAGULATION CONSULT NOTE  Pharmacy Consult:  Heparin Indication: Occluded bypass graft  Patient Measurements: Height: 5\' 4"  (162.6 cm) Weight: 90 lb (40.8 kg) IBW/kg (Calculated) : 54.7 Heparin Dosing Weight: 40.8 kg   Labs: Recent Labs    08/01/18 0325 08/02/18 0447 08/02/18 1618 08/03/18 0336  HGB 10.1* 10.2*  --  10.7*  HCT 32.8* 33.0*  --  34.7*  PLT 189 200  --  215  LABPROT 15.6* 18.1*  --  22.8*  INR 1.25 1.51  --  2.03  HEPARINUNFRC 0.35 0.29* 0.55 0.45    Estimated Creatinine Clearance: 45.4 mL/min (by C-G formula based on SCr of 0.81 mg/dL).   Assessment: 49 YOF with occluded left femoral-popliteal bypass graft.  Coumadin from PTA was switched to IV heparin.  She was also receiving alteplase through the catheter, but now off post IR. Pharmacy consulted to resume IV heparin and warfarin. Appears plans for discharge -heparin level= 0.45, INR= 2.03 (up from 1.51)  Home warfarin dose: 4mg /day   Goal of Therapy:  Heparin level 0.3-0.5 units/ml per MD Monitor platelets by anticoagulation protocol: Yes    Plan:  -Would consider discharged on home dose on 4mg /day (has been at goal on this dose in the last 2 clinic visits)  Hildred Laser, PharmD Clinical Pharmacist Please check Amion for pharmacy contact number

## 2018-08-05 NOTE — Discharge Summary (Signed)
Physician Discharge Summary   Patient ID: Kristin Knight [id number] 72 y.o. 1946/09/23  Admit date: 07/25/2018  Discharge date and time: 08/03/2018 10:48 AM   Admitting Physician: Serafina Mitchell, MD   Discharge Physician: same  Admission Diagnoses: Occlusion of femoropopliteal bypass graft [facility]) [T82.898A]  Discharge Diagnoses: PAD  Admission Condition: fair  Discharged Condition: good  Indication for Admission: occluded L femoral to popliteal bypass  Hospital Course: [name] is a 72 year old female who came in the emergency department and was found to have an occluded left femoral to popliteal bypass graft.  She was taken to the [facility] lab by Dr. Trula Slade and underwent left lower extremity arteriogram with initiation of thrombolysis on 07/25/2018.  Catheter was left in place for infusion of TPA and she was admitted to the ICU postoperatively.  The next day she was taken back to the [facility] lab by Dr. Carlis Abbott and underwent recheck lysis with angioplasty of distal anastomosis as well as the native popliteal artery.  Postoperatively she had a palpable left posterior tibial artery pulse.  POD #1 pharmacy was consulted to begin to bridge patient from IV heparin to therapeutic INR with Coumadin.  This unfortunately took about 5 to 6 days.  She maintained a faintly palpable left PT a pulse and a multiphasic Doppler signal throughout her hospital stay.  Eventually INR became therapeutic and IV heparin was discontinued.  Patient states she Artie has a follow-up appointment with her Coumadin clinic.  She also be noted that the patient was started on 75 mg of Plavix daily.  She will follow-up in office in about 4 weeks with graft surveillance duplex and ABIs.  Discharge instructions were reviewed with the patient and they voiced her understanding.  She was discharged in stable condition to home.  Consults: None  Treatments: surgery: LLE arteriogram with thrombolysis by Dr. Trula Slade on 07/25/18 with recheck lysis  and distal anastomosis and native popliteal angioplasty by Dr. Carlis Abbott 07/26/18  Discharge Exam: see progress note 08/03/18 Vitals:   08/03/18 0337 08/03/18 0809  BP: (!) 85/49 113/63  Pulse: (!) 54   Resp: 19   Temp: 97.9 F (36.6 C)   SpO2: 100%     Disposition: home  Patient Instructions:  Allergies as of 08/03/2018      Reactions   Penicillins Anaphylaxis, Other (See Comments)   Patient passed out Has patient had a PCN reaction causing immediate rash, facial/tongue/throat swelling, SOB or lightheadedness with hypotension: No Has patient had a PCN reaction causing severe rash involving mucus membranes or skin necrosis: No Has patient had a PCN reaction that required hospitalization: No Has patient had a PCN reaction occurring within the last 10 years: No If all of the above answers are "NO", then may proceed with Cephalosporin use.   Meperidine Hcl Other (See Comments)   nevousness (Demerol)   Chantix [varenicline] Nausea And Vomiting      Medication List    STOP taking these medications   enoxaparin 30 MG/0.3ML injection Commonly known as:  LOVENOX   enoxaparin 40 MG/0.4ML injection Commonly known as:  LOVENOX     TAKE these medications   albuterol 108 (90 Base) MCG/ACT inhaler Commonly known as:  PROVENTIL HFA;VENTOLIN HFA Inhale 2 puffs into the lungs every 6 (six) hours as needed for shortness of breath.   alendronate 70 MG tablet Commonly known as:  FOSAMAX Take 1 tablet (70 mg total) by mouth every 7 (seven) days. Take with a full glass of water on an empty  stomach.   calcium citrate-vitamin D 315-200 MG-UNIT tablet Commonly known as:  CITRACAL+D Take 2 tablets by mouth 2 (two) times daily.   clopidogrel 75 MG tablet Commonly known as:  PLAVIX Take 1 tablet (75 mg total) by mouth daily with breakfast.   lisinopril 20 MG tablet Commonly known as:  PRINIVIL,ZESTRIL Take 1 tablet (20 mg total) by mouth daily.   lovastatin 20 MG tablet Commonly known as:   MEVACOR Take 1 tablet (20 mg total) at bedtime by mouth.   mirtazapine 30 MG tablet Commonly known as:  REMERON Take 1 tablet (30 mg total) by mouth at bedtime.   oxyCODONE-acetaminophen 7.5-325 MG tablet Commonly known as:  PERCOCET Take 1 tablet by mouth every 8 (eight) hours as needed for severe pain.   potassium chloride 10 MEQ tablet Commonly known as:  K-DUR Take 1 tablet (10 mEq total) by mouth daily.   triamterene-hydrochlorothiazide 37.5-25 MG tablet Commonly known as:  MAXZIDE-25 Take 1 tablet by mouth daily.   Vitamin B-12 2500 MCG Subl Place 2,500 mcg under the tongue daily.   warfarin 4 MG tablet Commonly known as:  COUMADIN Take as directed. If you are unsure how to take this medication, talk to your nurse or doctor. Original instructions:  TAKE 1 TABLET BY MOUTH ONCE DAILY AT  6  PM. What changed:  See the new instructions.      Activity: activity as tolerated Diet: regular diet Wound Care: none needed  Follow-up with Dr. Trula Slade in 4 weeks.  SignedDagoberto Ligas 08/05/2018 8:50 AM

## 2018-08-15 ENCOUNTER — Other Ambulatory Visit: Payer: Self-pay

## 2018-08-15 DIAGNOSIS — M545 Low back pain: Principal | ICD-10-CM

## 2018-08-15 DIAGNOSIS — G8929 Other chronic pain: Secondary | ICD-10-CM

## 2018-08-15 MED ORDER — OXYCODONE-ACETAMINOPHEN 7.5-325 MG PO TABS: 1.0000 | ORAL_TABLET | Freq: Three times a day (TID) | ORAL | 0 refills | Status: DC | PRN

## 2018-08-15 NOTE — Telephone Encounter (Signed)
oxyCODONE-acetaminophen (PERCOCET) 7.5-325 MG tablet, REFILL REQUEST @  [place], [place] - [address] [phone] (Phone) [phone] (Fax)

## 2018-08-15 NOTE — Telephone Encounter (Signed)
Last rx written 9/2. Last OV 02/17/18. UDS 02/17/18. Next OV 09/15/18.

## 2018-08-28 ENCOUNTER — Other Ambulatory Visit: Payer: Self-pay

## 2018-08-28 ENCOUNTER — Ambulatory Visit (INDEPENDENT_AMBULATORY_CARE_PROVIDER_SITE_OTHER): Payer: Self-pay | Admitting: Family

## 2018-08-28 ENCOUNTER — Ambulatory Visit (INDEPENDENT_AMBULATORY_CARE_PROVIDER_SITE_OTHER)
Admit: 2018-08-28 | Discharge: 2018-08-28 | Disposition: A | Discharge status: Home / Self Care | Payer: Medicare HMO | Attending: Family | Admitting: Family

## 2018-08-28 ENCOUNTER — Encounter: Payer: Self-pay | Admitting: Family

## 2018-08-28 ENCOUNTER — Ambulatory Visit (HOSPITAL_COMMUNITY)
Admission: RE | Admit: 2018-08-28 | Discharge: 2018-08-28 | Disposition: A | Discharge status: Home / Self Care | Payer: Medicare HMO | Source: Ambulatory Visit | Attending: Family | Admitting: Family

## 2018-08-28 VITALS — BP 137/96 | HR 64 | Temp 97.9°F | Resp 14 | Ht 64.0 in | Wt 98.0 lb

## 2018-08-28 DIAGNOSIS — Z87891 Personal history of nicotine dependence: Secondary | ICD-10-CM

## 2018-08-28 DIAGNOSIS — I779 Disorder of arteries and arterioles, unspecified: Secondary | ICD-10-CM | POA: Diagnosis present

## 2018-08-28 NOTE — Patient Instructions (Addendum)
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
## 2018-09-08 ENCOUNTER — Other Ambulatory Visit: Payer: Self-pay

## 2018-09-08 DIAGNOSIS — I779 Disorder of arteries and arterioles, unspecified: Secondary | ICD-10-CM

## 2018-09-15 ENCOUNTER — Ambulatory Visit (INDEPENDENT_AMBULATORY_CARE_PROVIDER_SITE_OTHER): Payer: Medicare HMO | Admitting: Internal Medicine

## 2018-09-15 ENCOUNTER — Encounter: Payer: Self-pay | Admitting: Internal Medicine

## 2018-09-15 VITALS — BP 131/68 | HR 65 | Temp 98.1°F | Wt 102.4 lb

## 2018-09-15 DIAGNOSIS — Z23 Encounter for immunization: Secondary | ICD-10-CM

## 2018-09-15 DIAGNOSIS — F17201 Nicotine dependence, unspecified, in remission: Secondary | ICD-10-CM

## 2018-09-15 DIAGNOSIS — Z720 Tobacco use: Secondary | ICD-10-CM

## 2018-09-15 DIAGNOSIS — Z79891 Long term (current) use of opiate analgesic: Secondary | ICD-10-CM

## 2018-09-15 DIAGNOSIS — I779 Disorder of arteries and arterioles, unspecified: Secondary | ICD-10-CM | POA: Diagnosis not present

## 2018-09-15 DIAGNOSIS — M545 Low back pain: Secondary | ICD-10-CM | POA: Diagnosis not present

## 2018-09-15 DIAGNOSIS — J449 Chronic obstructive pulmonary disease, unspecified: Secondary | ICD-10-CM

## 2018-09-15 DIAGNOSIS — Z7902 Long term (current) use of antithrombotics/antiplatelets: Secondary | ICD-10-CM

## 2018-09-15 DIAGNOSIS — Z86718 Personal history of other venous thrombosis and embolism: Secondary | ICD-10-CM

## 2018-09-15 DIAGNOSIS — F331 Major depressive disorder, recurrent, moderate: Secondary | ICD-10-CM

## 2018-09-15 DIAGNOSIS — E785 Hyperlipidemia, unspecified: Secondary | ICD-10-CM

## 2018-09-15 DIAGNOSIS — Z79899 Other long term (current) drug therapy: Secondary | ICD-10-CM

## 2018-09-15 DIAGNOSIS — G8929 Other chronic pain: Secondary | ICD-10-CM

## 2018-09-15 DIAGNOSIS — Z7901 Long term (current) use of anticoagulants: Secondary | ICD-10-CM

## 2018-09-15 DIAGNOSIS — I10 Essential (primary) hypertension: Secondary | ICD-10-CM | POA: Diagnosis not present

## 2018-09-15 DIAGNOSIS — Z681 Body mass index (BMI) 19 or less, adult: Secondary | ICD-10-CM

## 2018-09-15 DIAGNOSIS — E441 Mild protein-calorie malnutrition: Secondary | ICD-10-CM

## 2018-09-15 DIAGNOSIS — T82868S Thrombosis of vascular prosthetic devices, implants and grafts, sequela: Secondary | ICD-10-CM

## 2018-09-15 MED ORDER — ALBUTEROL SULFATE HFA 108 (90 BASE) MCG/ACT IN AERS: 2.0000 | INHALATION_SPRAY | Freq: Four times a day (QID) | RESPIRATORY_TRACT | 3 refills | Status: DC | PRN

## 2018-09-15 MED ORDER — LOVASTATIN 20 MG PO TABS: 20.0000 mg | ORAL_TABLET | Freq: Every day | ORAL | 3 refills | Status: DC

## 2018-09-15 NOTE — Assessment & Plan Note (Signed)
She received a flu vaccination today.  At the next blood draw we will also obtain a hepatitis C antibody.  Once this is completed she will be up-to-date on her healthcare maintenance.

## 2018-09-15 NOTE — Assessment & Plan Note (Signed)
Assessment  Her back pain is stable but does limit her ability to work if she does not have the Percocet.  She states she has not had the Percocet since discharge from the hospital despite it being ordered.  This has limited her ability to work to just 2 days/week when previously she was working 4 days/week.  During that time she would take 1 Percocet prior to her shift and 1 Percocet at noon.  This would help her get through her shift.  Working is very important to her not only financially but from a mental health standpoint and she would like to return to 4 days a week.  Plan  [facility] was called and they assured [place] that they do have the prescription.  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  It is hoped that she will be able to regain the 4 days a week shifts that she had been previously on.  We will reassess the efficacy of this therapy at the follow-up visit but will continue the Percocet 325-7.5 mg 1 tablet every 8 hours as needed dispense #60.

## 2018-09-15 NOTE — Patient Instructions (Addendum)
It was great to see you again.  I am sorry about the mix up with the pain medicine.  1) I refilled your percocet so that you can work and keep active.  2) Keep taking the other medications as you are.  I also renewed the inhaler and the cholesterol medication.  3) I have sent a message to the surgeon to see how long you will need the plavix.  4) We gave you the flu shot today.  5) Congrats on quitting smoking!  That is the one best thing you can do for your health.  I am so proud of you!  I will see you back in 6 months, sooner if necessary.

## 2018-09-15 NOTE — Assessment & Plan Note (Signed)
Assessment  She has had minimal symptoms from her mild chronic obstructive pulmonary disease and this is responded well to the albuterol inhaler as needed.  She recently ran out of her albuterol inhaler and was requesting a new prescription.  Plan  The albuterol was represcribed and will be continued given the efficacy of managing her mild symptoms.  We will reassess the efficacy of this intervention at the follow-up visit.

## 2018-09-15 NOTE — Assessment & Plan Note (Signed)
Assessment  The best news of this visit was that she was able to quit smoking on July 24, 2018.  She states her cravings are minimal at this point.  Plan  She was praised for her tobacco cessation and was asked to contact the clinic if she felt she needed help with the craving.  We will reassess her success at maintaining tobacco cessation at the follow-up visit.

## 2018-09-15 NOTE — Assessment & Plan Note (Signed)
Assessment  Her mood has improved with the increase in the mirtazapine to 30 mg by mouth daily.  This is corroborated by the reduction in her PHQ 9 score to 2.  She is happy with the efficacy of this therapy at this time and wishes to remain at this dose.  Plan  We will continue the mirtazapine at 30 mg by mouth daily and reassess the efficacy of this therapy at the follow-up visit with a repeat PHQ 9.

## 2018-09-15 NOTE — Assessment & Plan Note (Signed)
Assessment  She denies any claudication with walking at this time.  We continue to aggressively address her cardiovascular risk factors including her blood pressure which is well controlled and her hyperlipidemia.  Unfortunately, she has not been on the statin therapy recently.  The biggest news, and biggest contributor to decreasing her risks given her underlying peripheral vascular occlusive disease, is the fact that she quit smoking on July 24, 2018.  Plan  She was praised profusely for quitting smoking.  She was asked to contact us if she were to develop cravings and wanted help with pharmacologic intervention.  Her blood pressure medications were continued given the control of her blood pressure was adequate.  She was restarted on her lovastatin for her hyperlipidemia.  We will reassess the efficacy of these interventions and make sure she has not restarted smoking at the follow-up visit.

## 2018-09-15 NOTE — Assessment & Plan Note (Signed)
Assessment  Since we increased the mirtazapine dose and her mood has improved, her weight has increased slightly.  Her appetite has also improved on the increased dose of the mirtazapine.  Plan  We will continue the mirtazapine at 30 mg by mouth daily given the efficacy of controlling her symptoms, both subjectively and via the PHQ 9.  We will continue to follow her weight on this therapy.

## 2018-09-15 NOTE — Progress Notes (Signed)
   Subjective:    Patient ID: Kristin Knight, female    DOB: 1946-08-10, 72 y.o.   MRN: [id number]  HPI  Nicky Milhouse is here for follow-up of peripheral arterial occlusive disease, vascular graft thrombosis, moderate recurrent major depressive disorder, essential hypertension, tobacco abuse, chronic low back pain, mild chronic obstructive pulmonary disease, and mild protein-calorie malnutrition. Please see the A&P for the status of the pt's chronic medical problems.  She is w/o any acute complaints at this time.  Review of Systems  Constitutional: Negative for activity change, appetite change and unexpected weight change.  Respiratory: Negative for cough, chest tightness, shortness of breath and wheezing.   Cardiovascular: Negative for chest pain, palpitations and leg swelling.  Gastrointestinal: Positive for diarrhea. Negative for abdominal pain, constipation, nausea and vomiting.       Diarrhea earlier this week for 2 days that has since resolved.  Genitourinary: Negative for difficulty urinating.  Musculoskeletal: Positive for back pain. Negative for joint swelling and myalgias.  Skin: Negative for rash and wound.  Psychiatric/Behavioral: Positive for dysphoric mood.      Objective:   Physical Exam  Constitutional: She is oriented to person, place, and time. She appears well-developed. No distress.  HENT:  Head: Normocephalic and atraumatic.  Eyes: Right eye exhibits no discharge. Left eye exhibits no discharge. No scleral icterus.  Cardiovascular: Normal rate, regular rhythm and normal heart sounds. Exam reveals no gallop and no friction rub.  No murmur heard. Pulmonary/Chest: Effort normal and breath sounds normal. No stridor. No respiratory distress. She has no wheezes. She has no rales.  Musculoskeletal: Normal range of motion. She exhibits no edema, tenderness or deformity.  Neurological: She is alert and oriented to person, place, and time. She exhibits normal muscle tone.    Skin: Skin is warm and dry. No rash noted. She is not diaphoretic. No erythema.  Psychiatric: She has a normal mood and affect. Her behavior is normal. Judgment and thought content normal.  Nursing note and vitals reviewed.     Assessment & Plan:   Please see problem oriented charting.

## 2018-09-15 NOTE — Assessment & Plan Note (Signed)
Assessment  In September she had a vascular graft thrombosis related to running out of her warfarin and not refilling it in time.  This was addressed by vascular surgery.  She has been compliant with her warfarin therapy since and has had no further difficulties with vascular graft thrombosis.  Plan  She will continue on her warfarin therapy and follow closely in the anticoagulation clinic.  She is also on Plavix and I contacted the vascular surgery PA who told me she is to remain on this until her follow-up appointment in mid November.  Continuing the Plavix at that time will be determined during that visit.  We will reassess her compliance with the warfarin and the length of Plavix therapy recommended by the vascular surgery service when she is seen at the follow-up visit.

## 2018-09-15 NOTE — Assessment & Plan Note (Signed)
Assessment  Her blood pressure today was 131/68 which is at target.  This is on lisinopril 20 mg by mouth daily and triamterene-hydrochlorothiazide 37.5-25 mg 1 tablet by mouth daily.  She is tolerating this regimen well.  Plan  We will continue the lisinopril 20 mg by mouth daily and the triamterene-hydrochlorothiazide 87.5-25 mg 1 tablet by mouth daily.  We will reassess the efficacy of this therapy at the follow-up visit.

## 2018-10-03 ENCOUNTER — Other Ambulatory Visit: Payer: Self-pay | Admitting: Internal Medicine

## 2018-10-03 DIAGNOSIS — T82868S Thrombosis of vascular prosthetic devices, implants and grafts, sequela: Secondary | ICD-10-CM

## 2018-10-16 ENCOUNTER — Ambulatory Visit (INDEPENDENT_AMBULATORY_CARE_PROVIDER_SITE_OTHER): Payer: Medicare HMO | Admitting: Pharmacist

## 2018-10-16 DIAGNOSIS — Z86718 Personal history of other venous thrombosis and embolism: Secondary | ICD-10-CM

## 2018-10-16 DIAGNOSIS — Z5181 Encounter for therapeutic drug level monitoring: Secondary | ICD-10-CM

## 2018-10-16 DIAGNOSIS — T82868S Thrombosis of vascular prosthetic devices, implants and grafts, sequela: Secondary | ICD-10-CM

## 2018-10-16 DIAGNOSIS — Z7901 Long term (current) use of anticoagulants: Secondary | ICD-10-CM

## 2018-10-16 DIAGNOSIS — I779 Disorder of arteries and arterioles, unspecified: Secondary | ICD-10-CM | POA: Diagnosis not present

## 2018-10-16 LAB — POCT INR: INR: 2.1 (ref 2.0–3.0)

## 2018-10-16 MED ORDER — WARFARIN SODIUM 4 MG PO TABS: ORAL_TABLET | ORAL | 2 refills | Status: DC
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
## 2018-10-16 NOTE — Patient Instructions (Signed)
Patient instructed to take medications as defined in the Anti-coagulation Track section of this encounter.  Patient instructed to take today's dose.  Patient instructed to take  one and one-half (1 & 1/2) of your 4mg  blue colored warfarin tablet on Monday's and Thursday's. All other days, one only one (1) of your 4mg  blue colored warfarin tablets, once-daily at [facility].  Patient verbalized understanding of these instructions.

## 2018-10-18 NOTE — Progress Notes (Signed)
INTERNAL MEDICINE TEACHING ATTENDING ADDENDUM - Eliel Dudding M.D  Duration- indefinite, Indication- recurrent fem pop graft thrombosis, INR- therapeutic. Agree with pharmacy recommendations as outlined in their note.

## 2018-11-13 ENCOUNTER — Ambulatory Visit: Payer: Self-pay

## 2018-11-17 ENCOUNTER — Other Ambulatory Visit: Payer: Self-pay

## 2018-11-17 ENCOUNTER — Ambulatory Visit (INDEPENDENT_AMBULATORY_CARE_PROVIDER_SITE_OTHER): Payer: Medicare HMO | Admitting: Family

## 2018-11-17 ENCOUNTER — Ambulatory Visit (INDEPENDENT_AMBULATORY_CARE_PROVIDER_SITE_OTHER)
Admission: RE | Admit: 2018-11-17 | Discharge: 2018-11-17 | Disposition: A | Discharge status: Home / Self Care | Payer: Medicare HMO | Source: Ambulatory Visit | Attending: Family | Admitting: Family

## 2018-11-17 ENCOUNTER — Ambulatory Visit (HOSPITAL_COMMUNITY)
Admission: RE | Admit: 2018-11-17 | Discharge: 2018-11-17 | Disposition: A | Discharge status: Home / Self Care | Payer: Medicare HMO | Source: Ambulatory Visit | Attending: Family | Admitting: Family

## 2018-11-17 ENCOUNTER — Encounter: Payer: Self-pay | Admitting: Family

## 2018-11-17 VITALS — BP 116/73 | HR 62 | Temp 98.1°F | Resp 18 | Ht 64.0 in | Wt 102.0 lb

## 2018-11-17 DIAGNOSIS — Z95828 Presence of other vascular implants and grafts: Secondary | ICD-10-CM

## 2018-11-17 DIAGNOSIS — G8929 Other chronic pain: Secondary | ICD-10-CM

## 2018-11-17 DIAGNOSIS — I779 Disorder of arteries and arterioles, unspecified: Secondary | ICD-10-CM

## 2018-11-17 DIAGNOSIS — M545 Low back pain: Principal | ICD-10-CM

## 2018-11-17 DIAGNOSIS — Z87891 Personal history of nicotine dependence: Secondary | ICD-10-CM | POA: Diagnosis not present

## 2018-11-17 MED ORDER — OXYCODONE-ACETAMINOPHEN 7.5-325 MG PO TABS: 1.0000 | ORAL_TABLET | Freq: Three times a day (TID) | ORAL | 0 refills | Status: DC | PRN

## 2018-11-17 NOTE — Telephone Encounter (Signed)
oxyCODONE-acetaminophen (PERCOCET) 7.5-325 MG tablet, REFILL REQUEST @ [facility] ON [address].
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
## 2018-11-17 NOTE — Patient Instructions (Addendum)
To measure for knee high compression hose: Measure the length of calf (from the crease of the knee to the bottom of the heel), largest circumference of calf, and ankle circumference first thing in the morning before your legs have a chance to swell.  Take these 3 measurements with you to obtain 20-30, or 15-20, or 5-15 mm mercury graduated knee high compression hose.  Put the stockings on in the morning, remove at bedtime.      Peripheral Vascular Disease  Peripheral vascular disease (PVD) is a disease of the blood vessels that are not part of your heart and brain. A simple term for PVD is poor circulation. In most cases, PVD narrows the blood vessels that carry blood from your heart to the rest of your body. This can reduce the supply of blood to your arms, legs, and internal organs, like your stomach or kidneys. However, PVD most often affects a person's lower legs and feet. Without treatment, PVD tends to get worse. PVD can also lead to acute ischemic limb. This is when an arm or leg suddenly cannot get enough blood. This is a medical emergency. Follow these instructions at home: Lifestyle  Do not use any products that contain nicotine or tobacco, such as cigarettes and e-cigarettes. If you need help quitting, ask your doctor.  Lose weight if you are overweight. Or, stay at a healthy weight as told by your doctor.  Eat a diet that is low in fat and cholesterol. If you need help, ask your doctor.  Exercise regularly. Ask your doctor for activities that are right for you. General instructions  Take over-the-counter and prescription medicines only as told by your doctor.  Take good care of your feet: ? Wear comfortable shoes that fit well. ? Check your feet often for any cuts or sores.  Keep all follow-up visits as told by your doctor This is important. Contact a doctor if:  You have cramps in your legs when you walk.  You have leg pain when you are at rest.  You have coldness  in a leg or foot.  Your skin changes.  You are unable to get or have an erection (erectile dysfunction).  You have cuts or sores on your feet that do not heal. Get help right away if:  Your arm or leg turns cold, numb, and blue.  Your arms or legs become red, warm, swollen, painful, or numb.  You have chest pain.  You have trouble breathing.  You suddenly have weakness in your face, arm, or leg.  You become very confused or you cannot speak.  You suddenly have a very bad headache.  You suddenly cannot see. Summary  Peripheral vascular disease (PVD) is a disease of the blood vessels.  A simple term for PVD is poor circulation. Without treatment, PVD tends to get worse.  Treatment may include exercise, low fat and low cholesterol diet, and quitting smoking. This information is not intended to replace advice given to you by your health care provider. Make sure you discuss any questions you have with your health care provider. Document Released: 01/26/2010 Document Revised: 12/09/2016 Document Reviewed: 12/09/2016 Elsevier Interactive Patient Education  2019 [employer].

## 2018-12-18 ENCOUNTER — Encounter: Payer: Self-pay | Admitting: Pharmacist

## 2018-12-18 ENCOUNTER — Ambulatory Visit (INDEPENDENT_AMBULATORY_CARE_PROVIDER_SITE_OTHER): Payer: Medicare HMO | Admitting: Pharmacist

## 2018-12-18 DIAGNOSIS — Z7901 Long term (current) use of anticoagulants: Secondary | ICD-10-CM

## 2018-12-18 DIAGNOSIS — I779 Disorder of arteries and arterioles, unspecified: Secondary | ICD-10-CM | POA: Diagnosis not present

## 2018-12-18 DIAGNOSIS — T82868S Thrombosis of vascular prosthetic devices, implants and grafts, sequela: Secondary | ICD-10-CM

## 2018-12-18 DIAGNOSIS — Z5181 Encounter for therapeutic drug level monitoring: Secondary | ICD-10-CM

## 2018-12-18 LAB — POCT INR: INR: 2 (ref 2.0–3.0)

## 2018-12-18 NOTE — Patient Instructions (Signed)
Patient instructed to take medications as defined in the Anti-coagulation Track section of this encounter.  Patient instructed to take today's dose.  Patient instructed to take one and one-half (1 & 1/2) of your 4mg  blue colored warfarin tablet on Sundays, Tuesdays, Thursdays and Saturdays.  All other days, one only one (1) of your 4mg  blue colored warfarin tablets, once-daily at [facility].  Patient verbalized understanding of these instructions.
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
## 2018-12-25 ENCOUNTER — Ambulatory Visit: Payer: Self-pay

## 2019-01-15 ENCOUNTER — Encounter: Payer: Self-pay | Admitting: Pharmacist

## 2019-01-15 ENCOUNTER — Ambulatory Visit (INDEPENDENT_AMBULATORY_CARE_PROVIDER_SITE_OTHER): Payer: Medicare HMO | Admitting: Pharmacist

## 2019-01-15 DIAGNOSIS — Z7901 Long term (current) use of anticoagulants: Secondary | ICD-10-CM

## 2019-01-15 DIAGNOSIS — Z5181 Encounter for therapeutic drug level monitoring: Secondary | ICD-10-CM | POA: Diagnosis not present

## 2019-01-15 DIAGNOSIS — I779 Disorder of arteries and arterioles, unspecified: Secondary | ICD-10-CM

## 2019-01-15 DIAGNOSIS — T82868S Thrombosis of vascular prosthetic devices, implants and grafts, sequela: Secondary | ICD-10-CM | POA: Diagnosis not present

## 2019-01-15 LAB — POCT INR: INR: 2.9 (ref 2.0–3.0)

## 2019-01-15 MED ORDER — WARFARIN SODIUM 4 MG PO TABS: ORAL_TABLET | ORAL | 2 refills | Status: DC

## 2019-01-15 NOTE — Progress Notes (Signed)
INTERNAL MEDICINE TEACHING ATTENDING ADDENDUM - Kalib Bhagat M.D  Duration- indefinite, Indication- recurrent fem pop graft thrombosis, INR- therapeutic. Agree with pharmacy recommendations as outlined in their note.

## 2019-01-15 NOTE — Patient Instructions (Signed)
Patient instructed to take medications as defined in the Anti-coagulation Track section of this encounter.  Patient instructed to take today's dose.  Patient instructed to take one and one-half (1 & 1/2) of your 4mg blue colored warfarin tablet on Sundays, Tuesdays, and Thursdays.  All other days, one only one (1) of your 4mg blue colored warfarin tablets, once-daily at 6PM.  Patient verbalized understanding of these instructions.    
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
## 2019-01-31 ENCOUNTER — Encounter: Payer: Self-pay | Admitting: Pharmacist

## 2019-01-31 ENCOUNTER — Other Ambulatory Visit: Payer: Self-pay | Admitting: Pharmacist

## 2019-01-31 DIAGNOSIS — Z7901 Long term (current) use of anticoagulants: Secondary | ICD-10-CM

## 2019-01-31 DIAGNOSIS — I779 Disorder of arteries and arterioles, unspecified: Secondary | ICD-10-CM

## 2019-01-31 DIAGNOSIS — T82868S Thrombosis of vascular prosthetic devices, implants and grafts, sequela: Secondary | ICD-10-CM

## 2019-02-02 ENCOUNTER — Other Ambulatory Visit: Payer: Self-pay | Admitting: *Deleted

## 2019-02-02 DIAGNOSIS — T82868S Thrombosis of vascular prosthetic devices, implants and grafts, sequela: Secondary | ICD-10-CM

## 2019-02-02 DIAGNOSIS — Z7901 Long term (current) use of anticoagulants: Secondary | ICD-10-CM

## 2019-02-02 DIAGNOSIS — I779 Disorder of arteries and arterioles, unspecified: Secondary | ICD-10-CM

## 2019-02-05 ENCOUNTER — Other Ambulatory Visit: Payer: Self-pay | Admitting: Internal Medicine

## 2019-02-05 DIAGNOSIS — I10 Essential (primary) hypertension: Secondary | ICD-10-CM

## 2019-02-12 ENCOUNTER — Encounter: Payer: Self-pay | Admitting: *Deleted

## 2019-02-12 ENCOUNTER — Ambulatory Visit: Payer: Self-pay

## 2019-02-14 ENCOUNTER — Other Ambulatory Visit: Payer: Self-pay

## 2019-02-14 ENCOUNTER — Other Ambulatory Visit: Payer: Self-pay | Admitting: Internal Medicine

## 2019-02-14 DIAGNOSIS — M545 Low back pain, unspecified: Secondary | ICD-10-CM

## 2019-02-14 DIAGNOSIS — G8929 Other chronic pain: Secondary | ICD-10-CM

## 2019-02-14 DIAGNOSIS — I779 Disorder of arteries and arterioles, unspecified: Secondary | ICD-10-CM

## 2019-02-14 NOTE — Telephone Encounter (Signed)
Needs refill on oxyCODONE-acetaminophen (PERCOCET) 7.5-325 MG tablet  [place], [place] - [address] ;pt contact [phone]

## 2019-02-15 NOTE — Telephone Encounter (Signed)
Last refilled 01/16/19. UDS 02/17/18.

## 2019-02-16 ENCOUNTER — Encounter (HOSPITAL_COMMUNITY): Payer: Self-pay

## 2019-02-16 ENCOUNTER — Other Ambulatory Visit (HOSPITAL_COMMUNITY): Payer: Self-pay

## 2019-02-16 ENCOUNTER — Ambulatory Visit: Payer: Self-pay | Admitting: Family

## 2019-02-16 MED ORDER — OXYCODONE-ACETAMINOPHEN 7.5-325 MG PO TABS: 1.0000 | ORAL_TABLET | Freq: Three times a day (TID) | ORAL | 0 refills | Status: DC | PRN

## 2019-03-19 ENCOUNTER — Other Ambulatory Visit: Payer: Self-pay

## 2019-03-19 DIAGNOSIS — M545 Low back pain: Principal | ICD-10-CM

## 2019-03-19 DIAGNOSIS — G8929 Other chronic pain: Secondary | ICD-10-CM

## 2019-03-19 MED ORDER — OXYCODONE-ACETAMINOPHEN 7.5-325 MG PO TABS: 1.0000 | ORAL_TABLET | Freq: Three times a day (TID) | ORAL | 0 refills | Status: DC | PRN

## 2019-03-19 NOTE — Telephone Encounter (Signed)
oxyCODONE-acetaminophen (PERCOCET) 7.5-325 MG tablet   Refill request @  [place], [place] - [address] [phone] (Phone) [phone] (Fax)

## 2019-03-21 ENCOUNTER — Ambulatory Visit (INDEPENDENT_AMBULATORY_CARE_PROVIDER_SITE_OTHER): Payer: Medicare HMO | Admitting: Internal Medicine

## 2019-03-21 ENCOUNTER — Other Ambulatory Visit: Payer: Self-pay

## 2019-03-21 DIAGNOSIS — I779 Disorder of arteries and arterioles, unspecified: Secondary | ICD-10-CM

## 2019-03-21 DIAGNOSIS — I10 Essential (primary) hypertension: Secondary | ICD-10-CM | POA: Diagnosis not present

## 2019-03-21 DIAGNOSIS — G8929 Other chronic pain: Secondary | ICD-10-CM

## 2019-03-21 DIAGNOSIS — M545 Low back pain, unspecified: Secondary | ICD-10-CM

## 2019-03-21 DIAGNOSIS — T82868S Thrombosis of vascular prosthetic devices, implants and grafts, sequela: Secondary | ICD-10-CM | POA: Diagnosis not present
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
## 2019-03-21 NOTE — Assessment & Plan Note (Signed)
She reports she has no issues with claudication.  She is taking plavix and coumadin.  Last INR was 2.9.  No bleeding.  She continues to follow with vascular surgery.

## 2019-03-21 NOTE — Patient Instructions (Signed)
Instructions given over the phone.  

## 2019-03-21 NOTE — Assessment & Plan Note (Signed)
She reports that her BP is doing well.  She is not checking it at home, but does have a cuff.  We discussed checking it once a week or if she were to develop any symptoms and she expressed understanding.  She has no issues with her current medications.   Plan Continue lisinopril-hctz BMET as needed

## 2019-03-21 NOTE — Assessment & Plan Note (Signed)
She is doing well, stable. She continues to have increased back pain with standing for a long while, which she does at work.  She is on a stable dose of percocet.  She has no red flags.  Her PDMP was reviewed and appropriate.  She had a 1 month refill on 03/19/19.  She is able to continue working with the assistance of the pain medication.

## 2019-03-21 NOTE — Assessment & Plan Note (Signed)
She is on warfarin.  Last INR was 2.9.  She has not been able to come in since that time.  I will contact Dr. Elie Confer in the anticoagulation clinic to see if she needs to come in for an INR check.  She is on lifelong anticoagulation.

## 2019-03-22 ENCOUNTER — Telehealth: Payer: Self-pay | Admitting: Pharmacist

## 2019-03-22 NOTE — Telephone Encounter (Signed)
Thank you Dr. Groce 

## 2019-03-22 NOTE — Telephone Encounter (Signed)
Called patient and advised her to go to [facility] on Monday 26-Mar-2019 for venous drawn INR and that based upon these results, I would call the following day when results are made available to me to adjust dose of warfarin if necessary.

## 2019-03-27 ENCOUNTER — Telehealth: Payer: Self-pay | Admitting: Pharmacist

## 2019-03-27 ENCOUNTER — Other Ambulatory Visit: Payer: Self-pay | Admitting: *Deleted

## 2019-03-27 DIAGNOSIS — I779 Disorder of arteries and arterioles, unspecified: Secondary | ICD-10-CM

## 2019-03-27 DIAGNOSIS — T82868S Thrombosis of vascular prosthetic devices, implants and grafts, sequela: Secondary | ICD-10-CM

## 2019-03-27 DIAGNOSIS — Z7901 Long term (current) use of anticoagulants: Secondary | ICD-10-CM

## 2019-03-27 LAB — PROTIME-INR
INR: 2.7 — ABNORMAL HIGH (ref 0.8–1.2)
Prothrombin Time: 27.4 s — ABNORMAL HIGH (ref 9.1–12.0)

## 2019-03-27 NOTE — Telephone Encounter (Signed)
Agree with plan.  Thank you Dr. Elie Confer.

## 2019-03-27 NOTE — Telephone Encounter (Signed)
Provided patient her INR value of 2.7 (goal 2.0 - 3.0) collected 26-Mar-2019 and provided to me 27-Mar-2019 on 34mg  warfarin/wk. Advised to continue same regimen. Repeat INR 23-Apr-2019. She elects to come to the [facility].  She denies any signs/symptoms of bleeding or embolic events.

## 2019-04-18 ENCOUNTER — Other Ambulatory Visit: Payer: Self-pay | Admitting: Pharmacist

## 2019-04-18 ENCOUNTER — Other Ambulatory Visit: Payer: Self-pay | Admitting: Internal Medicine

## 2019-04-18 DIAGNOSIS — T82868S Thrombosis of vascular prosthetic devices, implants and grafts, sequela: Secondary | ICD-10-CM

## 2019-04-18 DIAGNOSIS — M545 Low back pain, unspecified: Secondary | ICD-10-CM

## 2019-04-18 DIAGNOSIS — G8929 Other chronic pain: Secondary | ICD-10-CM

## 2019-04-18 NOTE — Telephone Encounter (Signed)
Refill Request   oxyCODONE-acetaminophen (PERCOCET) 7.5-325 MG tablet   [facility] [place], [place] - [address]

## 2019-04-20 MED ORDER — OXYCODONE-ACETAMINOPHEN 7.5-325 MG PO TABS: 1.0000 | ORAL_TABLET | Freq: Three times a day (TID) | ORAL | 0 refills | Status: DC | PRN

## 2019-04-23 ENCOUNTER — Other Ambulatory Visit: Payer: Self-pay

## 2019-04-23 ENCOUNTER — Ambulatory Visit (INDEPENDENT_AMBULATORY_CARE_PROVIDER_SITE_OTHER): Payer: Medicare HMO | Admitting: Pharmacist

## 2019-04-23 DIAGNOSIS — Z7901 Long term (current) use of anticoagulants: Secondary | ICD-10-CM | POA: Diagnosis not present

## 2019-04-23 DIAGNOSIS — T82868S Thrombosis of vascular prosthetic devices, implants and grafts, sequela: Secondary | ICD-10-CM

## 2019-04-23 DIAGNOSIS — I779 Disorder of arteries and arterioles, unspecified: Secondary | ICD-10-CM | POA: Diagnosis not present

## 2019-04-23 DIAGNOSIS — Z5181 Encounter for therapeutic drug level monitoring: Secondary | ICD-10-CM

## 2019-04-23 LAB — POCT INR: INR: 2.5 (ref 2.0–3.0)
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
## 2019-04-23 NOTE — Patient Instructions (Signed)
Patient instructed to take medications as defined in the Anti-coagulation Track section of this encounter.  Patient instructed to take today's dose.  Patient instructed to take one and one-half (1 & 1/2) of your 4mg  blue colored warfarin tablet on Sundays, Tuesdays, and Thursdays.  All other days, one only one (1) of your 4mg  blue colored warfarin tablets, once-daily at [facility].  Patient verbalized understanding of these instructions.

## 2019-05-10 ENCOUNTER — Other Ambulatory Visit: Payer: Self-pay

## 2019-05-10 DIAGNOSIS — I779 Disorder of arteries and arterioles, unspecified: Secondary | ICD-10-CM

## 2019-05-11 ENCOUNTER — Telehealth (HOSPITAL_COMMUNITY): Payer: Self-pay | Admitting: *Deleted

## 2019-05-11 NOTE — Telephone Encounter (Signed)
The above patient or their representative was contacted and gave the following answers to these questions:         Do you have any of the following symptoms?n  Fever                    Cough                   Shortness of breath  Do  you have any of the following other symptoms? n   muscle pain         vomiting,        diarrhea        rash         weakness        red eye        abdominal pain         bruising          bruising or bleeding              joint pain           severe headache    Have you been in contact with someone who was or has been sick in the past 2 weeks?n  Yes                 Unsure                         Unable to assess   Does the person that you were in contact with have any of the following symptoms?   Cough         shortness of breath           muscle pain         vomiting,            diarrhea            rash            weakness           fever            red eye           abdominal pain           bruising  or  bleeding                joint pain                severe headache               Have you  or someone you have been in contact with traveled internationally in th last month?   n      If yes, which countries?   Have you  or someone you have been in contact with traveled outside [place] in th last month?   n      If yes, which state and city?   COMMENTS OR ACTION PLAN FOR THIS PATIENT:         

## 2019-05-14 ENCOUNTER — Other Ambulatory Visit: Payer: Self-pay | Admitting: *Deleted

## 2019-05-14 ENCOUNTER — Ambulatory Visit (HOSPITAL_COMMUNITY)
Admission: RE | Admit: 2019-05-14 | Discharge: 2019-05-14 | Disposition: A | Discharge status: Home / Self Care | Payer: Medicare HMO | Source: Ambulatory Visit | Attending: Vascular Surgery | Admitting: Vascular Surgery

## 2019-05-14 ENCOUNTER — Other Ambulatory Visit: Payer: Self-pay

## 2019-05-14 ENCOUNTER — Ambulatory Visit (INDEPENDENT_AMBULATORY_CARE_PROVIDER_SITE_OTHER)
Admission: RE | Admit: 2019-05-14 | Discharge: 2019-05-14 | Disposition: A | Discharge status: Home / Self Care | Payer: Medicare HMO | Source: Ambulatory Visit | Attending: Vascular Surgery | Admitting: Vascular Surgery

## 2019-05-14 ENCOUNTER — Encounter: Payer: Self-pay | Admitting: Family

## 2019-05-14 ENCOUNTER — Ambulatory Visit: Payer: Medicare HMO | Admitting: Family

## 2019-05-14 VITALS — BP 136/87 | HR 64 | Temp 97.8°F | Resp 12 | Ht 64.0 in | Wt 118.0 lb

## 2019-05-14 DIAGNOSIS — I779 Disorder of arteries and arterioles, unspecified: Secondary | ICD-10-CM | POA: Diagnosis not present

## 2019-05-14 DIAGNOSIS — Z87891 Personal history of nicotine dependence: Secondary | ICD-10-CM

## 2019-05-14 DIAGNOSIS — Z95828 Presence of other vascular implants and grafts: Secondary | ICD-10-CM

## 2019-05-14 DIAGNOSIS — F331 Major depressive disorder, recurrent, moderate: Secondary | ICD-10-CM

## 2019-05-14 NOTE — Patient Instructions (Signed)
Peripheral Vascular Disease  Peripheral vascular disease (PVD) is a disease of the blood vessels that are not part of your heart and brain. A simple term for PVD is poor circulation. In most cases, PVD narrows the blood vessels that carry blood from your heart to the rest of your body. This can reduce the supply of blood to your arms, legs, and internal organs, like your stomach or kidneys. However, PVD most often affects a person's lower legs and feet. Without treatment, PVD tends to get worse. PVD can also lead to acute ischemic limb. This is when an arm or leg suddenly cannot get enough blood. This is a medical emergency. Follow these instructions at home: Lifestyle  Do not use any products that contain nicotine or tobacco, such as cigarettes and e-cigarettes. If you need help quitting, ask your doctor.  Lose weight if you are overweight. Or, stay at a healthy weight as told by your doctor.  Eat a diet that is low in fat and cholesterol. If you need help, ask your doctor.  Exercise regularly. Ask your doctor for activities that are right for you. General instructions  Take over-the-counter and prescription medicines only as told by your doctor.  Take good care of your feet: ? Wear comfortable shoes that fit well. ? Check your feet often for any cuts or sores.  Keep all follow-up visits as told by your doctor This is important. Contact a doctor if:  You have cramps in your legs when you walk.  You have leg pain when you are at rest.  You have coldness in a leg or foot.  Your skin changes.  You are unable to get or have an erection (erectile dysfunction).  You have cuts or sores on your feet that do not heal. Get help right away if:  Your arm or leg turns cold, numb, and blue.  Your arms or legs become red, warm, swollen, painful, or numb.  You have chest pain.  You have trouble breathing.  You suddenly have weakness in your face, arm, or leg.  You become very  confused or you cannot speak.  You suddenly have a very bad headache.  You suddenly cannot see. Summary  Peripheral vascular disease (PVD) is a disease of the blood vessels.  A simple term for PVD is poor circulation. Without treatment, PVD tends to get worse.  Treatment may include exercise, low fat and low cholesterol diet, and quitting smoking. This information is not intended to replace advice given to you by your health care provider. Make sure you discuss any questions you have with your health care provider. Document Released: 01/26/2010 Document Revised: 10/14/2017 Document Reviewed: 12/09/2016 Elsevier Patient Education  2020 Elsevier Inc.  
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
## 2019-05-15 MED ORDER — MIRTAZAPINE 30 MG PO TABS: 30.0000 mg | ORAL_TABLET | Freq: Every day | ORAL | 3 refills | Status: DC

## 2019-05-21 ENCOUNTER — Other Ambulatory Visit: Payer: Self-pay | Admitting: Internal Medicine

## 2019-05-21 ENCOUNTER — Ambulatory Visit (INDEPENDENT_AMBULATORY_CARE_PROVIDER_SITE_OTHER): Payer: Medicare HMO | Admitting: Pharmacist

## 2019-05-21 ENCOUNTER — Other Ambulatory Visit: Payer: Self-pay

## 2019-05-21 DIAGNOSIS — T82868S Thrombosis of vascular prosthetic devices, implants and grafts, sequela: Secondary | ICD-10-CM

## 2019-05-21 DIAGNOSIS — G8929 Other chronic pain: Secondary | ICD-10-CM

## 2019-05-21 DIAGNOSIS — Z5181 Encounter for therapeutic drug level monitoring: Secondary | ICD-10-CM

## 2019-05-21 DIAGNOSIS — I779 Disorder of arteries and arterioles, unspecified: Secondary | ICD-10-CM | POA: Diagnosis not present

## 2019-05-21 DIAGNOSIS — Z7901 Long term (current) use of anticoagulants: Secondary | ICD-10-CM | POA: Diagnosis not present

## 2019-05-21 LAB — POCT INR: INR: 3.4 — AB (ref 2.0–3.0)

## 2019-05-21 MED ORDER — WARFARIN SODIUM 4 MG PO TABS: ORAL_TABLET | ORAL | 1 refills | Status: DC

## 2019-05-21 NOTE — Patient Instructions (Signed)
Patient instructed to take medications as defined in the Anti-coagulation Track section of this encounter.  Patient instructed to take today's dose.  Patient instructed to take  one and one-half (1 & 1/2) of your 4mg  blue colored warfarin tablet on Wednedays. All other days, one only one (1) of your 4mg  blue colored warfarin tablets, once-daily at [facility].  Patient verbalized understanding of these instructions.
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
## 2019-05-21 NOTE — Telephone Encounter (Signed)
REFILL REQUEST  oxyCODONE-acetaminophen (PERCOCET) 7.5-325 MG tablet    [place] ([place]), [place] - [address] [phone] (Phone) [phone] (Fax)

## 2019-05-21 NOTE — Progress Notes (Signed)
INTERNAL MEDICINE TEACHING ATTENDING ADDENDUM - Aldine Contes M.D  Duration- indefinite, Indication- recurrent graft thrombosis, INR- supratherapeutic. Agree with pharmacy recommendations as outlined in their note.

## 2019-05-22 MED ORDER — OXYCODONE-ACETAMINOPHEN 7.5-325 MG PO TABS: 1.0000 | ORAL_TABLET | Freq: Three times a day (TID) | ORAL | 0 refills | Status: DC | PRN

## 2019-05-22 NOTE — Telephone Encounter (Signed)
This refill is appropriate, PDMP reviewed.  She is on chronic opiate therapy.  Unfortunately I cannot fill this from home.  Dr. Heber Tatamy - would you mind sending in a one month supply for me?  Thank you!

## 2019-06-25 ENCOUNTER — Other Ambulatory Visit: Payer: Self-pay

## 2019-06-25 ENCOUNTER — Other Ambulatory Visit: Payer: Self-pay | Admitting: Internal Medicine

## 2019-06-25 ENCOUNTER — Ambulatory Visit (INDEPENDENT_AMBULATORY_CARE_PROVIDER_SITE_OTHER): Payer: Medicare HMO | Admitting: Pharmacist

## 2019-06-25 DIAGNOSIS — Z7901 Long term (current) use of anticoagulants: Secondary | ICD-10-CM

## 2019-06-25 DIAGNOSIS — G8929 Other chronic pain: Secondary | ICD-10-CM

## 2019-06-25 DIAGNOSIS — M545 Low back pain, unspecified: Secondary | ICD-10-CM

## 2019-06-25 DIAGNOSIS — T82868S Thrombosis of vascular prosthetic devices, implants and grafts, sequela: Secondary | ICD-10-CM

## 2019-06-25 DIAGNOSIS — Z5181 Encounter for therapeutic drug level monitoring: Secondary | ICD-10-CM

## 2019-06-25 DIAGNOSIS — I779 Disorder of arteries and arterioles, unspecified: Secondary | ICD-10-CM | POA: Diagnosis not present

## 2019-06-25 LAB — POCT INR: INR: 2.3 (ref 2.0–3.0)

## 2019-06-25 MED ORDER — OXYCODONE-ACETAMINOPHEN 7.5-325 MG PO TABS: 1.0000 | ORAL_TABLET | Freq: Three times a day (TID) | ORAL | 0 refills | Status: DC | PRN

## 2019-06-25 NOTE — Telephone Encounter (Signed)
Last rx written  05/22/19. Last OV  03/21/19. Next OV  07/06/19. UDS 02/17/18.

## 2019-06-25 NOTE — Telephone Encounter (Signed)
REFILL REQUEST  oxyCODONE-acetaminophen (PERCOCET) 7.5-325 MG tablet  [place] ([place]), [place] - [address] [phone] (Phone) [phone] (Fax)
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
## 2019-06-25 NOTE — Patient Instructions (Signed)
Patient instructed to take medications as defined in the Anti-coagulation Track section of this encounter.  Patient instructed to take today's dose.  Patient instructed to take one and one-half (1 & 1/2) of your 4mg  blue colored warfarin tablet on Mondays and  Wednedays. All other days, one only one (1) of your 4mg  blue colored warfarin tablets, once-daily at [facility].  Patient verbalized understanding of these instructions.

## 2019-06-28 ENCOUNTER — Other Ambulatory Visit: Payer: Self-pay | Admitting: *Deleted

## 2019-06-28 DIAGNOSIS — I10 Essential (primary) hypertension: Secondary | ICD-10-CM

## 2019-06-28 MED ORDER — TRIAMTERENE-HCTZ 37.5-25 MG PO TABS: 1.0000 | ORAL_TABLET | Freq: Every day | ORAL | 3 refills | Status: DC

## 2019-07-06 ENCOUNTER — Ambulatory Visit (INDEPENDENT_AMBULATORY_CARE_PROVIDER_SITE_OTHER): Payer: Medicare HMO | Admitting: Internal Medicine

## 2019-07-06 ENCOUNTER — Ambulatory Visit (HOSPITAL_COMMUNITY)
Admission: RE | Admit: 2019-07-06 | Discharge: 2019-07-06 | Disposition: A | Discharge status: Home / Self Care | Payer: Medicare HMO | Source: Ambulatory Visit | Attending: Internal Medicine | Admitting: Internal Medicine

## 2019-07-06 ENCOUNTER — Other Ambulatory Visit: Payer: Self-pay

## 2019-07-06 ENCOUNTER — Encounter: Payer: Self-pay | Admitting: Internal Medicine

## 2019-07-06 VITALS — BP 132/75 | HR 66 | Temp 98.2°F | Ht 64.0 in | Wt 120.2 lb

## 2019-07-06 DIAGNOSIS — Z0000 Encounter for general adult medical examination without abnormal findings: Secondary | ICD-10-CM

## 2019-07-06 DIAGNOSIS — F331 Major depressive disorder, recurrent, moderate: Secondary | ICD-10-CM

## 2019-07-06 DIAGNOSIS — I743 Embolism and thrombosis of arteries of the lower extremities: Secondary | ICD-10-CM | POA: Insufficient documentation

## 2019-07-06 DIAGNOSIS — Z7901 Long term (current) use of anticoagulants: Secondary | ICD-10-CM

## 2019-07-06 DIAGNOSIS — Z7983 Long term (current) use of bisphosphonates: Secondary | ICD-10-CM

## 2019-07-06 DIAGNOSIS — J449 Chronic obstructive pulmonary disease, unspecified: Secondary | ICD-10-CM

## 2019-07-06 DIAGNOSIS — I739 Peripheral vascular disease, unspecified: Secondary | ICD-10-CM | POA: Diagnosis not present

## 2019-07-06 DIAGNOSIS — E441 Mild protein-calorie malnutrition: Secondary | ICD-10-CM

## 2019-07-06 DIAGNOSIS — Z6820 Body mass index (BMI) 20.0-20.9, adult: Secondary | ICD-10-CM

## 2019-07-06 DIAGNOSIS — Z1239 Encounter for other screening for malignant neoplasm of breast: Secondary | ICD-10-CM

## 2019-07-06 DIAGNOSIS — I872 Venous insufficiency (chronic) (peripheral): Secondary | ICD-10-CM

## 2019-07-06 DIAGNOSIS — Z7902 Long term (current) use of antithrombotics/antiplatelets: Secondary | ICD-10-CM

## 2019-07-06 DIAGNOSIS — R002 Palpitations: Secondary | ICD-10-CM

## 2019-07-06 DIAGNOSIS — M545 Low back pain: Secondary | ICD-10-CM

## 2019-07-06 DIAGNOSIS — Z8719 Personal history of other diseases of the digestive system: Secondary | ICD-10-CM

## 2019-07-06 DIAGNOSIS — M810 Age-related osteoporosis without current pathological fracture: Secondary | ICD-10-CM

## 2019-07-06 DIAGNOSIS — Z79899 Other long term (current) drug therapy: Secondary | ICD-10-CM

## 2019-07-06 DIAGNOSIS — Z87891 Personal history of nicotine dependence: Secondary | ICD-10-CM

## 2019-07-06 DIAGNOSIS — I10 Essential (primary) hypertension: Secondary | ICD-10-CM | POA: Diagnosis not present

## 2019-07-06 DIAGNOSIS — Z79891 Long term (current) use of opiate analgesic: Secondary | ICD-10-CM

## 2019-07-06 DIAGNOSIS — G8929 Other chronic pain: Secondary | ICD-10-CM | POA: Diagnosis not present

## 2019-07-06 DIAGNOSIS — K5790 Diverticulosis of intestine, part unspecified, without perforation or abscess without bleeding: Secondary | ICD-10-CM

## 2019-07-06 DIAGNOSIS — K648 Other hemorrhoids: Secondary | ICD-10-CM

## 2019-07-06 MED ORDER — ALBUTEROL SULFATE HFA 108 (90 BASE) MCG/ACT IN AERS: 2.0000 | INHALATION_SPRAY | Freq: Four times a day (QID) | RESPIRATORY_TRACT | 1 refills | Status: DC | PRN

## 2019-07-06 NOTE — Assessment & Plan Note (Signed)
She notes no bleeding with bowel movements, no pain, no need for medications.  Continue to monitor.

## 2019-07-06 NOTE — Assessment & Plan Note (Signed)
She is doing well in this area.  She did not complete PHQ-9, but per her report she feels well.  She notes that mirtazapine is working well for her mood.   Plan Continue mirtazapine.

## 2019-07-06 NOTE — Assessment & Plan Note (Signed)
BP today was well controlled on current three drug regimen.  We will check her renal function and potassium today.   Plan Continue lisinopril, triamterene - hctz Check CMET

## 2019-07-06 NOTE — Assessment & Plan Note (Signed)
She has now been abstinent for almost one year.   We reviewed her smoking history.  She started in 2013, smoked 1 PPD.  This gives her 6 pack years.  She would not qualify for lung cancer screening.

## 2019-07-06 NOTE — Assessment & Plan Note (Signed)
She notes this is well controlled, no current complaints.  She successfully quit smoking tobacco products almost one year ago.  She uses PRN albuterol and needs a refill.   Plan Refill albuterol

## 2019-07-06 NOTE — Assessment & Plan Note (Signed)
She is doing well, she has no swelling or pain in her legs.  She has no evidence of decompensation at this time.  Continue to monitor.

## 2019-07-06 NOTE — Assessment & Plan Note (Signed)
Last DEXA scan from 2015 showed osteoporosis of spine and bilateral hips.  She is on alendronate and calcium/vitamin D.  It would be reasonable to check for improvement given she has been on medication for 5 years.   Plan Repeat DEXA Continue alendronate and supplements.

## 2019-07-06 NOTE — Patient Instructions (Signed)
Kristin Knight - -  Thank you for coming in today!  Your EKG looked good, just showed a mildly low heart rate.  If you have any chest pain or palpitations that do not resolve, please call the clinic immediately or go to the ED.  Please come back to see me in 3-4 months!  Congratulations on stopping smoking!!

## 2019-07-06 NOTE — Assessment & Plan Note (Signed)
She is on a chronic dose of oxycodone-apap.  This allows her to continue working and making an income, which is important to her.   Plan Continue therapy at current dose PDMP reviewed and appropriate Consider UDS in future.

## 2019-07-06 NOTE — Assessment & Plan Note (Signed)
She notes that she has gained a significant amount of weight since stopping smoking and this has encouraged her.  Continue to monitor.

## 2019-07-06 NOTE — Progress Notes (Signed)
   Subjective:    Patient ID: Kristin Knight, female    DOB: [birth year], 73 y.o.   MRN: [id number]  CC: 3 month follow up for HTN  HPI  [name] is a 73 year old woman with PMH of HTN, MDD, PAD, Chronic pain, mild COPD who presented for follow up.  She reports that she is doing very well.  No complaints today.  She is taking chronic opiate therapy and this medication continues to help her be active and work.  She is working at [employer].  She is staying socially distanced and wearing a mask at work.    Her BP today was well controlled.  She is taking lisinopril and triamterine-hctz without issue.  She has mild MDD which is controlled with mirtazapine.  She has PAD and is on chronic plavix, statin and coumadin.  Her last INR was 2.3.  She has not had any bleeding, no rectal bleeding or pain with bowel movements.  She has occasional need for albuterol inhaler, and she requests a prescription.    When she had her surgery last year, the physician who saw her felt an irregular heart beat.  She has had no chest pain, but notes that she occasionally feel palpitations. Last EKG was around the time of her surgery and showed sinus bradycardia.  We will do an EKG today.    Review of Systems  Constitutional: Negative for activity change, diaphoresis, fatigue and fever.  Respiratory: Negative for cough, shortness of breath and wheezing.   Cardiovascular: Positive for palpitations. Negative for chest pain and leg swelling.  Gastrointestinal: Negative for anal bleeding, blood in stool and constipation.  Musculoskeletal: Positive for arthralgias. Negative for back pain.  Neurological: Negative for dizziness, light-headedness and headaches.  Psychiatric/Behavioral: Negative for decreased concentration and dysphoric mood.       Objective:   Physical Exam Constitutional:      General: She is not in acute distress.    Appearance: Normal appearance.  HENT:     Head: Normocephalic and atraumatic.   Cardiovascular:     Rate and Rhythm: Normal rate and regular rhythm.     Pulses: Normal pulses.     Heart sounds: No murmur.  Pulmonary:     Effort: Pulmonary effort is normal. No respiratory distress.     Breath sounds: Normal breath sounds. No wheezing.  Musculoskeletal:        General: No swelling, tenderness or signs of injury.     Right lower leg: No edema.     Left lower leg: No edema.  Skin:    General: Skin is warm and dry.     Coloration: Skin is not jaundiced.  Neurological:     Mental Status: She is alert and oriented to person, place, and time. Mental status is at baseline.  Psychiatric:        Mood and Affect: Mood normal.        Thought Content: Thought content normal.    HCV, CMET, CBC today  EKG showed sinus bradycardia, J point elevation, no PVCs or PACs       Assessment & Plan:  Return in 3-4 months, sooner if needed.

## 2019-07-06 NOTE — Assessment & Plan Note (Signed)
She reports no bleeding or issues with her bowels.  Continue to monitor.

## 2019-07-06 NOTE — Assessment & Plan Note (Signed)
Mammogram - 2018 - negative, due Colonoscopy 2014 - hyperplastic polyp, due in 2024 DEXA - 2015 - osteoporosis, repeat today Lung - does not meet criteria Pap - assess at next visit.

## 2019-07-07 LAB — CMP14 + ANION GAP
ALT: 10 IU/L (ref 0–32)
AST: 23 IU/L (ref 0–40)
Albumin/Globulin Ratio: 1.7 (ref 1.2–2.2)
Albumin: 4.6 g/dL (ref 3.7–4.7)
Alkaline Phosphatase: 63 IU/L (ref 39–117)
Anion Gap: 18 mmol/L (ref 10.0–18.0)
BUN/Creatinine Ratio: 19 (ref 12–28)
BUN: 21 mg/dL (ref 8–27)
Bilirubin Total: <0.2 mg/dL (ref 0.0–1.2)
CO2: 25 mmol/L (ref 20–29)
Calcium: 9.7 mg/dL (ref 8.7–10.3)
Chloride: 100 mmol/L (ref 96–106)
Creatinine, Ser: 1.09 mg/dL — ABNORMAL HIGH (ref 0.57–1.00)
GFR calc Af Amer: 59 mL/min/{1.73_m2} — ABNORMAL LOW (ref >59)
GFR calc non Af Amer: 51 mL/min/{1.73_m2} — ABNORMAL LOW (ref >59)
Globulin, Total: 2.7 g/dL (ref 1.5–4.5)
Glucose: 98 mg/dL (ref 65–99)
Potassium: 4.1 mmol/L (ref 3.5–5.2)
Sodium: 143 mmol/L (ref 134–144)
Total Protein: 7.3 g/dL (ref 6.0–8.5)

## 2019-07-07 LAB — CBC
Hematocrit: 37.4 % (ref 34.0–46.6)
Hemoglobin: 12 g/dL (ref 11.1–15.9)
MCH: 28.1 pg (ref 26.6–33.0)
MCHC: 32.1 g/dL (ref 31.5–35.7)
MCV: 88 fL (ref 79–97)
Platelets: 230 10*3/uL (ref 150–450)
RBC: 4.27 x10E6/uL (ref 3.77–5.28)
RDW: 13.1 % (ref 11.7–15.4)
WBC: 4.6 10*3/uL (ref 3.4–10.8)

## 2019-07-07 LAB — HEPATITIS C ANTIBODY: Hep C Virus Ab: 0.2 s/co ratio (ref 0.0–0.9)

## 2019-07-09 ENCOUNTER — Telehealth: Payer: Self-pay | Admitting: Internal Medicine

## 2019-07-09 NOTE — Telephone Encounter (Signed)
Called Ms. Vandevoort to give her lab results.  Confirmed identity with name and DOB.   Lab work looked good.  Anemia is resolved.  She had a  Mild increase in her Cr, but this seems like it has happened before some years ago.  Will check at next visit to evaluate for a trend.   HCV Ab was negative.   She will follow up as planned.   Gilles Chiquito, MD

## 2019-07-16 ENCOUNTER — Other Ambulatory Visit: Payer: Self-pay

## 2019-07-16 DIAGNOSIS — T82868S Thrombosis of vascular prosthetic devices, implants and grafts, sequela: Secondary | ICD-10-CM

## 2019-07-16 MED ORDER — WARFARIN SODIUM 4 MG PO TABS: ORAL_TABLET | ORAL | 1 refills | Status: DC

## 2019-07-16 NOTE — Telephone Encounter (Signed)
warfarin (COUMADIN) 4 MG tablet, REFILL REQUEST @  [place] ([place]), [place] - [address] [phone] (Phone) [phone] (Fax)

## 2019-07-20 ENCOUNTER — Other Ambulatory Visit: Payer: Self-pay

## 2019-07-20 DIAGNOSIS — G8929 Other chronic pain: Secondary | ICD-10-CM

## 2019-07-20 MED ORDER — OXYCODONE-ACETAMINOPHEN 7.5-325 MG PO TABS: 1.0000 | ORAL_TABLET | Freq: Three times a day (TID) | ORAL | 0 refills | Status: DC | PRN

## 2019-07-20 NOTE — Telephone Encounter (Signed)
oxyCODONE-acetaminophen (PERCOCET) 7.5-325 MG tablet refill request @  [place] ([place]), [place] - [address] [phone] (Phone) [phone] (Fax)

## 2019-07-20 NOTE — Telephone Encounter (Signed)
Last rx written 06/25/19. Last OV 07/06/19. Next OV 10/05/19. UDS 02/17/18.

## 2019-07-30 ENCOUNTER — Ambulatory Visit (INDEPENDENT_AMBULATORY_CARE_PROVIDER_SITE_OTHER): Payer: Medicare HMO | Admitting: Pharmacist

## 2019-07-30 ENCOUNTER — Other Ambulatory Visit: Payer: Self-pay

## 2019-07-30 DIAGNOSIS — Z5181 Encounter for therapeutic drug level monitoring: Secondary | ICD-10-CM | POA: Diagnosis not present

## 2019-07-30 DIAGNOSIS — Z7901 Long term (current) use of anticoagulants: Secondary | ICD-10-CM

## 2019-07-30 DIAGNOSIS — T82868S Thrombosis of vascular prosthetic devices, implants and grafts, sequela: Secondary | ICD-10-CM | POA: Diagnosis not present

## 2019-07-30 DIAGNOSIS — I779 Disorder of arteries and arterioles, unspecified: Secondary | ICD-10-CM | POA: Diagnosis not present

## 2019-07-30 LAB — POCT INR: INR: 3.2 — AB (ref 2.0–3.0)

## 2019-07-30 NOTE — Patient Instructions (Signed)
Patient instructed to take medications as defined in the Anti-coagulation Track section of this encounter.  Patient instructed to take today's dose.  Patient instructed to take  one and one-half (1 & 1/2) of your 4mg blue colored warfarin tablet on Wednedays. All other days, one only one (1) of your 4mg blue colored warfarin tablets, once-daily at 6PM.  Patient verbalized understanding of these instructions.    
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
## 2019-07-31 NOTE — Progress Notes (Signed)
INTERNAL MEDICINE TEACHING ATTENDING ADDENDUM - Ellana Kawa M.D  Duration- indefinite, Indication- recurrent graft thrombosis, INR- supratherapeutic. Agree with pharmacy recommendations as outlined in their note.      

## 2019-08-13 ENCOUNTER — Other Ambulatory Visit: Payer: Self-pay

## 2019-08-13 ENCOUNTER — Other Ambulatory Visit: Payer: Self-pay | Admitting: Internal Medicine

## 2019-08-13 DIAGNOSIS — G8929 Other chronic pain: Secondary | ICD-10-CM

## 2019-08-13 DIAGNOSIS — I779 Disorder of arteries and arterioles, unspecified: Secondary | ICD-10-CM

## 2019-08-13 MED ORDER — OXYCODONE-ACETAMINOPHEN 7.5-325 MG PO TABS: 1.0000 | ORAL_TABLET | Freq: Three times a day (TID) | ORAL | 0 refills | Status: DC | PRN

## 2019-08-13 NOTE — Telephone Encounter (Signed)
Refill Request  oxyCODONE-acetaminophen (PERCOCET) 7.5-325 MG tablet  [facility] - [place] ([place] - [address] 

## 2019-08-14 ENCOUNTER — Telehealth (HOSPITAL_COMMUNITY): Payer: Self-pay | Admitting: *Deleted

## 2019-08-14 NOTE — Telephone Encounter (Signed)
The above patient or their representative was contacted and gave the following answers to these questions:         Do you have any of the following symptoms?n  Fever                    Cough                   Shortness of breath  Do  you have any of the following other symptoms? n   muscle pain         vomiting,        diarrhea        rash         weakness        red eye        abdominal pain         bruising          bruising or bleeding              joint pain           severe headache    Have you been in contact with someone who was or has been sick in the past 2 weeks?n  Yes                 Unsure                         Unable to assess   Does the person that you were in contact with have any of the following symptoms?   Cough         shortness of breath           muscle pain         vomiting,            diarrhea            rash            weakness           fever            red eye           abdominal pain           bruising  or  bleeding                joint pain                severe headache               Have you  or someone you have been in contact with traveled internationally in th last month?         If yes, which countries?   Have you  or someone you have been in contact with traveled outside [place] in th last month?         If yes, which state and city?   COMMENTS OR ACTION PLAN FOR THIS PATIENT:         [facility]  

## 2019-08-15 ENCOUNTER — Ambulatory Visit: Payer: Medicare HMO | Admitting: Family

## 2019-08-15 ENCOUNTER — Encounter: Payer: Self-pay | Admitting: Family

## 2019-08-15 ENCOUNTER — Other Ambulatory Visit: Payer: Self-pay

## 2019-08-15 ENCOUNTER — Ambulatory Visit (INDEPENDENT_AMBULATORY_CARE_PROVIDER_SITE_OTHER)
Admission: RE | Admit: 2019-08-15 | Discharge: 2019-08-15 | Disposition: A | Discharge status: Home / Self Care | Payer: Medicare HMO | Source: Ambulatory Visit | Attending: Family | Admitting: Family

## 2019-08-15 ENCOUNTER — Ambulatory Visit (HOSPITAL_COMMUNITY)
Admission: RE | Admit: 2019-08-15 | Discharge: 2019-08-15 | Disposition: A | Discharge status: Home / Self Care | Payer: Medicare HMO | Source: Ambulatory Visit | Attending: Family | Admitting: Family

## 2019-08-15 VITALS — BP 128/64 | HR 60 | Temp 97.8°F | Resp 12 | Ht 64.0 in | Wt 120.2 lb

## 2019-08-15 DIAGNOSIS — Z87891 Personal history of nicotine dependence: Secondary | ICD-10-CM | POA: Diagnosis not present

## 2019-08-15 DIAGNOSIS — I779 Disorder of arteries and arterioles, unspecified: Secondary | ICD-10-CM | POA: Diagnosis not present

## 2019-08-15 DIAGNOSIS — Z95828 Presence of other vascular implants and grafts: Secondary | ICD-10-CM

## 2019-08-15 NOTE — Patient Instructions (Signed)
Peripheral Vascular Disease  Peripheral vascular disease (PVD) is a disease of the blood vessels that are not part of your heart and brain. A simple term for PVD is poor circulation. In most cases, PVD narrows the blood vessels that carry blood from your heart to the rest of your body. This can reduce the supply of blood to your arms, legs, and internal organs, like your stomach or kidneys. However, PVD most often affects a person's lower legs and feet. Without treatment, PVD tends to get worse. PVD can also lead to acute ischemic limb. This is when an arm or leg suddenly cannot get enough blood. This is a medical emergency. Follow these instructions at home: Lifestyle  Do not use any products that contain nicotine or tobacco, such as cigarettes and e-cigarettes. If you need help quitting, ask your doctor.  Lose weight if you are overweight. Or, stay at a healthy weight as told by your doctor.  Eat a diet that is low in fat and cholesterol. If you need help, ask your doctor.  Exercise regularly. Ask your doctor for activities that are right for you. General instructions  Take over-the-counter and prescription medicines only as told by your doctor.  Take good care of your feet: ? Wear comfortable shoes that fit well. ? Check your feet often for any cuts or sores.  Keep all follow-up visits as told by your doctor This is important. Contact a doctor if:  You have cramps in your legs when you walk.  You have leg pain when you are at rest.  You have coldness in a leg or foot.  Your skin changes.  You are unable to get or have an erection (erectile dysfunction).  You have cuts or sores on your feet that do not heal. Get help right away if:  Your arm or leg turns cold, numb, and blue.  Your arms or legs become red, warm, swollen, painful, or numb.  You have chest pain.  You have trouble breathing.  You suddenly have weakness in your face, arm, or leg.  You become very  confused or you cannot speak.  You suddenly have a very bad headache.  You suddenly cannot see. Summary  Peripheral vascular disease (PVD) is a disease of the blood vessels.  A simple term for PVD is poor circulation. Without treatment, PVD tends to get worse.  Treatment may include exercise, low fat and low cholesterol diet, and quitting smoking. This information is not intended to replace advice given to you by your health care provider. Make sure you discuss any questions you have with your health care provider. Document Released: 01/26/2010 Document Revised: 10/14/2017 Document Reviewed: 12/09/2016 Elsevier Patient Education  2020 Elsevier Inc.  
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
## 2019-08-27 ENCOUNTER — Ambulatory Visit: Payer: Medicare HMO

## 2019-09-03 ENCOUNTER — Other Ambulatory Visit: Payer: Self-pay

## 2019-09-03 ENCOUNTER — Ambulatory Visit (INDEPENDENT_AMBULATORY_CARE_PROVIDER_SITE_OTHER): Payer: Medicare HMO | Admitting: Pharmacist

## 2019-09-03 ENCOUNTER — Other Ambulatory Visit: Payer: Self-pay | Admitting: Internal Medicine

## 2019-09-03 DIAGNOSIS — Z5181 Encounter for therapeutic drug level monitoring: Secondary | ICD-10-CM

## 2019-09-03 DIAGNOSIS — I779 Disorder of arteries and arterioles, unspecified: Secondary | ICD-10-CM | POA: Diagnosis not present

## 2019-09-03 DIAGNOSIS — Z7901 Long term (current) use of anticoagulants: Secondary | ICD-10-CM

## 2019-09-03 DIAGNOSIS — T82868S Thrombosis of vascular prosthetic devices, implants and grafts, sequela: Secondary | ICD-10-CM

## 2019-09-03 DIAGNOSIS — G8929 Other chronic pain: Secondary | ICD-10-CM

## 2019-09-03 DIAGNOSIS — M545 Low back pain: Secondary | ICD-10-CM

## 2019-09-03 LAB — POCT INR: INR: 2.9 (ref 2.0–3.0)

## 2019-09-03 MED ORDER — WARFARIN SODIUM 4 MG PO TABS: 4.0000 mg | ORAL_TABLET | Freq: Every day | ORAL | 2 refills | Status: DC

## 2019-09-03 MED ORDER — OXYCODONE-ACETAMINOPHEN 7.5-325 MG PO TABS: 1.0000 | ORAL_TABLET | Freq: Three times a day (TID) | ORAL | 0 refills | Status: DC | PRN

## 2019-09-03 NOTE — Patient Instructions (Signed)
Patient instructed to take medications as defined in the Anti-coagulation Track section of this encounter.  Patient instructed to take today's dose.  Patient instructed to take one (1) of your 4mg blue colored warfarin tablets, once-daily at 6PM. Patient verbalized understanding of these instructions.   

## 2019-09-03 NOTE — Telephone Encounter (Signed)
REFILL REQUEST ° °oxyCODONE-acetaminophen (PERCOCET) 7.5-325 MG tablet ° °[facility] - [place] ([place]), [place] - [address] [phone] (Phone) °[phone] (Fax)  ° ° °
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
## 2019-10-01 ENCOUNTER — Other Ambulatory Visit: Payer: Self-pay | Admitting: Internal Medicine

## 2019-10-01 ENCOUNTER — Ambulatory Visit (INDEPENDENT_AMBULATORY_CARE_PROVIDER_SITE_OTHER): Payer: Medicare HMO | Admitting: Pharmacist

## 2019-10-01 ENCOUNTER — Other Ambulatory Visit: Payer: Self-pay

## 2019-10-01 DIAGNOSIS — Z5181 Encounter for therapeutic drug level monitoring: Secondary | ICD-10-CM | POA: Diagnosis not present

## 2019-10-01 DIAGNOSIS — T82868S Thrombosis of vascular prosthetic devices, implants and grafts, sequela: Secondary | ICD-10-CM

## 2019-10-01 DIAGNOSIS — I779 Disorder of arteries and arterioles, unspecified: Secondary | ICD-10-CM

## 2019-10-01 DIAGNOSIS — Z7901 Long term (current) use of anticoagulants: Secondary | ICD-10-CM

## 2019-10-01 DIAGNOSIS — G8929 Other chronic pain: Secondary | ICD-10-CM

## 2019-10-01 LAB — POCT INR: INR: 2.4 (ref 2.0–3.0)

## 2019-10-01 NOTE — Patient Instructions (Signed)
Patient instructed to take medications as defined in the Anti-coagulation Track section of this encounter.  Patient instructed to take today's dose.  Patient instructed to take one (1) of your 4mg blue colored warfarin tablets, once-daily at 6PM. Patient verbalized understanding of these instructions.   
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
## 2019-10-01 NOTE — Telephone Encounter (Signed)
Needs refill on oxyCODONE-acetaminophen (PERCOCET) 7.5-325 MG tablet  ;pt contact [facility] ([place]), [place] - [address]

## 2019-10-02 MED ORDER — OXYCODONE-ACETAMINOPHEN 7.5-325 MG PO TABS: 1.0000 | ORAL_TABLET | Freq: Three times a day (TID) | ORAL | 0 refills | Status: DC | PRN

## 2019-10-03 ENCOUNTER — Other Ambulatory Visit: Payer: Medicare HMO

## 2019-10-03 ENCOUNTER — Ambulatory Visit: Payer: Medicare HMO

## 2019-10-03 NOTE — Progress Notes (Signed)
INTERNAL MEDICINE TEACHING ATTENDING ADDENDUM - Tanaysha Alkins M.D  Duration- indefinite, Indication- recurrent fem pop graft thrombosis, INR- therapeutic. Agree with pharmacy recommendations as outlined in their note.

## 2019-10-05 ENCOUNTER — Ambulatory Visit (INDEPENDENT_AMBULATORY_CARE_PROVIDER_SITE_OTHER): Payer: Medicare HMO | Admitting: Internal Medicine

## 2019-10-05 ENCOUNTER — Encounter: Payer: Self-pay | Admitting: Internal Medicine

## 2019-10-05 ENCOUNTER — Other Ambulatory Visit: Payer: Self-pay

## 2019-10-05 VITALS — BP 147/90 | HR 66 | Temp 99.4°F | Ht 64.0 in | Wt 125.2 lb

## 2019-10-05 DIAGNOSIS — I739 Peripheral vascular disease, unspecified: Secondary | ICD-10-CM

## 2019-10-05 DIAGNOSIS — Z7901 Long term (current) use of anticoagulants: Secondary | ICD-10-CM

## 2019-10-05 DIAGNOSIS — M549 Dorsalgia, unspecified: Secondary | ICD-10-CM

## 2019-10-05 DIAGNOSIS — J449 Chronic obstructive pulmonary disease, unspecified: Secondary | ICD-10-CM

## 2019-10-05 DIAGNOSIS — Z86718 Personal history of other venous thrombosis and embolism: Secondary | ICD-10-CM

## 2019-10-05 DIAGNOSIS — M25612 Stiffness of left shoulder, not elsewhere classified: Secondary | ICD-10-CM

## 2019-10-05 DIAGNOSIS — I10 Essential (primary) hypertension: Secondary | ICD-10-CM

## 2019-10-05 DIAGNOSIS — M1990 Unspecified osteoarthritis, unspecified site: Secondary | ICD-10-CM

## 2019-10-05 DIAGNOSIS — Z79899 Other long term (current) drug therapy: Secondary | ICD-10-CM

## 2019-10-05 DIAGNOSIS — Z0000 Encounter for general adult medical examination without abnormal findings: Secondary | ICD-10-CM

## 2019-10-05 DIAGNOSIS — M25512 Pain in left shoulder: Secondary | ICD-10-CM | POA: Diagnosis not present

## 2019-10-05 DIAGNOSIS — F329 Major depressive disorder, single episode, unspecified: Secondary | ICD-10-CM

## 2019-10-05 MED ORDER — ALBUTEROL SULFATE HFA 108 (90 BASE) MCG/ACT IN AERS: 2.0000 | INHALATION_SPRAY | Freq: Four times a day (QID) | RESPIRATORY_TRACT | 1 refills | Status: DC | PRN

## 2019-10-05 NOTE — Assessment & Plan Note (Signed)
She has mammogram and DEXA scheduled in the new year.

## 2019-10-05 NOTE — Patient Instructions (Signed)
Ms. Hrabe - -  Thank you for coming in today.   For your blood pressure, please decrease salt in your diet.  (DASH diet)  For the stiffness in your arm, please see range of motion exercises.    Come back to see me in 3 months.   Thank you!!   DASH Eating Plan DASH stands for "Dietary Approaches to Stop Hypertension." The DASH eating plan is a healthy eating plan that has been shown to reduce high blood pressure (hypertension). It may also reduce your risk for type 2 diabetes, heart disease, and stroke. The DASH eating plan may also help with weight loss. What are tips for following this plan?  General guidelines  Avoid eating more than 2,300 mg (milligrams) of salt (sodium) a day. If you have hypertension, you may need to reduce your sodium intake to 1,500 mg a day.  Limit alcohol intake to no more than 1 drink a day for nonpregnant women and 2 drinks a day for men. One drink equals 12 oz of beer, 5 oz of wine, or 1 oz of hard liquor.  Work with your health care provider to maintain a healthy body weight or to lose weight. Ask what an ideal weight is for you.  Get at least 30 minutes of exercise that causes your heart to beat faster (aerobic exercise) most days of the week. Activities may include walking, swimming, or biking.  Work with your health care provider or diet and nutrition specialist (dietitian) to adjust your eating plan to your individual calorie needs. Reading food labels   Check food labels for the amount of sodium per serving. Choose foods with less than 5 percent of the Daily Value of sodium. Generally, foods with less than 300 mg of sodium per serving fit into this eating plan.  To find whole grains, look for the word "whole" as the first word in the ingredient list. Shopping  Buy products labeled as "low-sodium" or "no salt added."  Buy fresh foods. Avoid canned foods and premade or frozen meals. Cooking  Avoid adding salt when cooking. Use salt-free  seasonings or herbs instead of table salt or sea salt. Check with your health care provider or pharmacist before using salt substitutes.  Do not fry foods. Cook foods using healthy methods such as baking, boiling, grilling, and broiling instead.  Cook with heart-healthy oils, such as olive, canola, soybean, or sunflower oil. Meal planning  Eat a balanced diet that includes: ? 5 or more servings of fruits and vegetables each day. At each meal, try to fill half of your plate with fruits and vegetables. ? Up to 6-8 servings of whole grains each day. ? Less than 6 oz of lean meat, poultry, or fish each day. A 3-oz serving of meat is about the same size as a deck of cards. One egg equals 1 oz. ? 2 servings of low-fat dairy each day. ? A serving of nuts, seeds, or beans 5 times each week. ? Heart-healthy fats. Healthy fats called Omega-3 fatty acids are found in foods such as flaxseeds and coldwater fish, like sardines, salmon, and mackerel.  Limit how much you eat of the following: ? Canned or prepackaged foods. ? Food that is high in trans fat, such as fried foods. ? Food that is high in saturated fat, such as fatty meat. ? Sweets, desserts, sugary drinks, and other foods with added sugar. ? Full-fat dairy products.  Do not salt foods before eating.  Try to eat at  least 2 vegetarian meals each week.  Eat more home-cooked food and less restaurant, buffet, and fast food.  When eating at a restaurant, ask that your food be prepared with less salt or no salt, if possible. What foods are recommended? The items listed may not be a complete list. Talk with your dietitian about what dietary choices are best for you. Grains Whole-grain or whole-wheat bread. Whole-grain or whole-wheat pasta. Brown rice. [place]. Bulgur. Whole-grain and low-sodium cereals. Pita bread. Low-fat, low-sodium crackers. Whole-wheat flour tortillas. Vegetables Fresh or frozen vegetables (raw, steamed, roasted, or  grilled). Low-sodium or reduced-sodium tomato and vegetable juice. Low-sodium or reduced-sodium tomato sauce and tomato paste. Low-sodium or reduced-sodium canned vegetables. Fruits All fresh, dried, or frozen fruit. Canned fruit in natural juice (without added sugar). Meat and other protein foods Skinless chicken or [place]. Ground chicken or [place]. Pork with fat trimmed off. Fish and seafood. Egg whites. Dried beans, peas, or lentils. Unsalted nuts, nut butters, and seeds. Unsalted canned beans. Lean cuts of beef with fat trimmed off. Low-sodium, lean deli meat. Dairy Low-fat (1%) or fat-free (skim) milk. Fat-free, low-fat, or reduced-fat cheeses. Nonfat, low-sodium ricotta or cottage cheese. Low-fat or nonfat yogurt. Low-fat, low-sodium cheese. Fats and oils Soft margarine without trans fats. Vegetable oil. Low-fat, reduced-fat, or light mayonnaise and salad dressings (reduced-sodium). Canola, safflower, olive, soybean, and sunflower oils. Avocado. Seasoning and other foods Herbs. Spices. Seasoning mixes without salt. Unsalted popcorn and pretzels. Fat-free sweets. What foods are not recommended? The items listed may not be a complete list. Talk with your dietitian about what dietary choices are best for you. Grains Baked goods made with fat, such as croissants, muffins, or some breads. Dry pasta or rice meal packs. Vegetables Creamed or fried vegetables. Vegetables in a cheese sauce. Regular canned vegetables (not low-sodium or reduced-sodium). Regular canned tomato sauce and paste (not low-sodium or reduced-sodium). Regular tomato and vegetable juice (not low-sodium or reduced-sodium). Angie Fava. Olives. Fruits Canned fruit in a light or heavy syrup. Fried fruit. Fruit in cream or butter sauce. Meat and other protein foods Fatty cuts of meat. Ribs. Fried meat. Berniece Salines. Sausage. Bologna and other processed lunch meats. Salami. Fatback. Hotdogs. Bratwurst. Salted nuts and seeds. Canned beans with  added salt. Canned or smoked fish. Whole eggs or egg yolks. Chicken or [place] with skin. Dairy Whole or 2% milk, cream, and half-and-half. Whole or full-fat cream cheese. Whole-fat or sweetened yogurt. Full-fat cheese. Nondairy creamers. Whipped toppings. Processed cheese and cheese spreads. Fats and oils Butter. Stick margarine. Lard. Shortening. Ghee. Bacon fat. Tropical oils, such as coconut, palm kernel, or palm oil. Seasoning and other foods Salted popcorn and pretzels. Onion salt, garlic salt, seasoned salt, table salt, and sea salt. Worcestershire sauce. Tartar sauce. Barbecue sauce. Teriyaki sauce. Soy sauce, including reduced-sodium. Steak sauce. Canned and packaged gravies. Fish sauce. Oyster sauce. Cocktail sauce. Horseradish that you find on the shelf. Ketchup. Mustard. Meat flavorings and tenderizers. Bouillon cubes. Hot sauce and Tabasco sauce. Premade or packaged marinades. Premade or packaged taco seasonings. Relishes. Regular salad dressings. Where to find more information:  National Heart, Lung, and [place]: https://wilson-eaton.com/  American Heart Association: www.heart.org Summary  The DASH eating plan is a healthy eating plan that has been shown to reduce high blood pressure (hypertension). It may also reduce your risk for type 2 diabetes, heart disease, and stroke.  With the DASH eating plan, you should limit salt (sodium) intake to 2,300 mg a day. If you have hypertension, you  may need to reduce your sodium intake to 1,500 mg a day.  When on the DASH eating plan, aim to eat more fresh fruits and vegetables, whole grains, lean proteins, low-fat dairy, and heart-healthy fats.  Work with your health care provider or diet and nutrition specialist (dietitian) to adjust your eating plan to your individual calorie needs. This information is not intended to replace advice given to you by your health care provider. Make sure you discuss any questions you have with your health care  provider. Document Released: 10/21/2011 Document Revised: 10/14/2017 Document Reviewed: 10/25/2016 Elsevier Patient Education  2020 [employer].

## 2019-10-05 NOTE — Assessment & Plan Note (Signed)
BP was elevated today.  She notes increased ingestion of salt.  We discussed the DASH diet and she knows that she is supposed to eat out less and cook for herself without salt.  She would like to try these dietary changes before making changes to her medications.   Plan DASH diet supplied to Kristin Knight Continue triple therapy with triamterine, hctz, lisinopril Check BMET

## 2019-10-05 NOTE — Assessment & Plan Note (Signed)
She notes no injury, no change in her normal behavior, no tearing motion.  Her strength is normal and well balanced from left to right.  She only has pain and decreased range of motion with internal rotation and attempting to touch her back.  We discussed that in this circumstance, PT would be the best option, however, she would prefer to try exercises at home.  We discussed the risk for frozen shoulder and she was understanding.  I provided her with 4 exercises to do at home.   Plan Attempt exercises at home Reassess at next visit.

## 2019-10-05 NOTE — Progress Notes (Signed)
   Subjective:    Patient ID: Kristin Knight, female    DOB: 1946/02/27, 73 y.o.   MRN: [id number]  CC: 3 month follow up for blood pressure  HPI  Kristin Knight is a pleasant 73 year old woman with PMH of PAD, graft thrombosis on coumadin, COPD, OA, MDD who presents for follow up of HTN.  Her BP is elevated today and she notes that she has been eating a lot of salt. She is committed to changing her diet and does not want a change in medications today.  She brings with her her combination pill of triamterine-hctz and lisinopril.   She also notes shoulder pain, mainly with internal rotation and attempt to reach her back.  She has no signs of AC joint inflammation, no point tenderness.  Pain is only illicited on attempt to rotate shoulder and touch her back.  She notes that this is most problematic when trying to bathe.   She would prefer to try exercises at home, which we discussed.    Review of Systems  Constitutional: Negative for activity change, appetite change and fatigue.  Respiratory: Negative for cough and shortness of breath.   Cardiovascular: Negative for chest pain and leg swelling.  Gastrointestinal: Negative for abdominal distention and abdominal pain.  Musculoskeletal: Positive for arthralgias and back pain. Negative for joint swelling and myalgias.  Neurological: Negative for dizziness and weakness.  Psychiatric/Behavioral: Negative for decreased concentration and dysphoric mood.       Objective:   Physical Exam Vitals signs and nursing note reviewed.  Constitutional:      General: She is not in acute distress.    Appearance: Normal appearance. She is not toxic-appearing.  HENT:     Head: Normocephalic and atraumatic.  Cardiovascular:     Rate and Rhythm: Normal rate and regular rhythm.     Heart sounds: No murmur.  Pulmonary:     Effort: Pulmonary effort is normal. No respiratory distress.  Musculoskeletal:        General: No swelling.     Left shoulder: She exhibits  decreased range of motion and pain. She exhibits no tenderness, no bony tenderness, no swelling, no effusion, no crepitus, no deformity, no laceration, no spasm, normal pulse and normal strength.     Right lower leg: No edema.     Left lower leg: No edema.  Skin:    General: Skin is warm and dry.  Neurological:     General: No focal deficit present.     Mental Status: She is alert and oriented to person, place, and time.     Motor: No weakness.     Gait: Gait normal.  Psychiatric:        Mood and Affect: Mood normal.        Behavior: Behavior normal.     BMET today for renal function.       Assessment & Plan:  Return in 3 months for BP evaluation

## 2019-10-06 LAB — BMP8+ANION GAP
Anion Gap: 14 mmol/L (ref 10.0–18.0)
BUN/Creatinine Ratio: 16 (ref 12–28)
BUN: 15 mg/dL (ref 8–27)
CO2: 27 mmol/L (ref 20–29)
Calcium: 9.4 mg/dL (ref 8.7–10.3)
Chloride: 100 mmol/L (ref 96–106)
Creatinine, Ser: 0.96 mg/dL (ref 0.57–1.00)
GFR calc Af Amer: 68 mL/min/{1.73_m2} (ref >59)
GFR calc non Af Amer: 59 mL/min/{1.73_m2} — ABNORMAL LOW (ref >59)
Glucose: 101 mg/dL — ABNORMAL HIGH (ref 65–99)
Potassium: 3.9 mmol/L (ref 3.5–5.2)
Sodium: 141 mmol/L (ref 134–144)

## 2019-10-09 ENCOUNTER — Encounter: Payer: Self-pay | Admitting: Internal Medicine

## 2019-10-22 ENCOUNTER — Telehealth: Payer: Self-pay | Admitting: *Deleted

## 2019-10-22 NOTE — Telephone Encounter (Signed)
Thank you!  I didn't think so!  Paperwork can go in gray box.

## 2019-10-22 NOTE — Telephone Encounter (Signed)
The office has received paperwork requesting information b/c pt has requested a back /knee/ankle brace or equipment. I called pt - stated she has not requested nor does she need any type of brace. Also stated this has been the second time we had called her about a brace.

## 2019-10-29 ENCOUNTER — Other Ambulatory Visit: Payer: Self-pay | Admitting: Internal Medicine

## 2019-10-29 ENCOUNTER — Other Ambulatory Visit: Payer: Self-pay

## 2019-10-29 ENCOUNTER — Ambulatory Visit (INDEPENDENT_AMBULATORY_CARE_PROVIDER_SITE_OTHER): Payer: Medicare HMO | Admitting: Pharmacist

## 2019-10-29 DIAGNOSIS — Z5181 Encounter for therapeutic drug level monitoring: Secondary | ICD-10-CM

## 2019-10-29 DIAGNOSIS — Z7901 Long term (current) use of anticoagulants: Secondary | ICD-10-CM

## 2019-10-29 DIAGNOSIS — T82868S Thrombosis of vascular prosthetic devices, implants and grafts, sequela: Secondary | ICD-10-CM

## 2019-10-29 DIAGNOSIS — G8929 Other chronic pain: Secondary | ICD-10-CM

## 2019-10-29 DIAGNOSIS — Z86718 Personal history of other venous thrombosis and embolism: Secondary | ICD-10-CM | POA: Diagnosis not present

## 2019-10-29 DIAGNOSIS — I779 Disorder of arteries and arterioles, unspecified: Secondary | ICD-10-CM | POA: Diagnosis not present

## 2019-10-29 LAB — POCT INR: INR: 2.4 (ref 2.0–3.0)

## 2019-10-29 MED ORDER — OXYCODONE-ACETAMINOPHEN 7.5-325 MG PO TABS: 1.0000 | ORAL_TABLET | Freq: Three times a day (TID) | ORAL | 0 refills | Status: DC | PRN

## 2019-10-29 NOTE — Telephone Encounter (Signed)
REFILL REQUEST  oxyCODONE-acetaminophen (PERCOCET) 7.5-325 MG tablet  [facility] ([place]), [place] - [address] [phone] (Phone) [phone] (Fax
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
## 2019-10-29 NOTE — Patient Instructions (Signed)
Patient instructed to take medications as defined in the Anti-coagulation Track section of this encounter.  Patient instructed to take today's dose.  Patient instructed to take one (1) of your 4mg blue colored warfarin tablets, once-daily at 6PM. Patient verbalized understanding of these instructions.   

## 2019-11-26 ENCOUNTER — Ambulatory Visit
Admission: RE | Admit: 2019-11-26 | Discharge: 2019-11-26 | Disposition: A | Discharge status: Home / Self Care | Payer: Medicare HMO | Source: Ambulatory Visit | Attending: Internal Medicine | Admitting: Internal Medicine

## 2019-11-26 ENCOUNTER — Ambulatory Visit (INDEPENDENT_AMBULATORY_CARE_PROVIDER_SITE_OTHER): Payer: Medicare HMO | Admitting: Pharmacist

## 2019-11-26 ENCOUNTER — Other Ambulatory Visit: Payer: Self-pay | Admitting: Internal Medicine

## 2019-11-26 ENCOUNTER — Other Ambulatory Visit: Payer: Self-pay

## 2019-11-26 DIAGNOSIS — Z1239 Encounter for other screening for malignant neoplasm of breast: Secondary | ICD-10-CM

## 2019-11-26 DIAGNOSIS — Z5181 Encounter for therapeutic drug level monitoring: Secondary | ICD-10-CM | POA: Diagnosis not present

## 2019-11-26 DIAGNOSIS — I779 Disorder of arteries and arterioles, unspecified: Secondary | ICD-10-CM

## 2019-11-26 DIAGNOSIS — T82868S Thrombosis of vascular prosthetic devices, implants and grafts, sequela: Secondary | ICD-10-CM | POA: Diagnosis not present

## 2019-11-26 DIAGNOSIS — Z7901 Long term (current) use of anticoagulants: Secondary | ICD-10-CM | POA: Diagnosis not present

## 2019-11-26 DIAGNOSIS — M545 Low back pain, unspecified: Secondary | ICD-10-CM

## 2019-11-26 DIAGNOSIS — G8929 Other chronic pain: Secondary | ICD-10-CM

## 2019-11-26 LAB — POCT INR: INR: 2.9 (ref 2.0–3.0)
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
## 2019-11-26 NOTE — Patient Instructions (Signed)
Patient instructed to take medications as defined in the Anti-coagulation Track section of this encounter.  Patient instructed to take today's dose.  Patient instructed to take 1 of your 4mg  strength-blue warfarin tablets by mouth daily at 6PM--EXCEPT on Mondays, take ONLY 1/2 of your 4mg  strength-blue warfarin tablets by mouth at [facility] on Mondays.  Patient verbalized understanding of these instructions.

## 2019-11-26 NOTE — Telephone Encounter (Signed)
REFILL REQUEST  oxyCODONE-acetaminophen (PERCOCET) 7.5-325 MG tablet  [place] ([place]), [place] - [address] [phone] (Phone) [phone] (Fax)

## 2019-11-28 MED ORDER — OXYCODONE-ACETAMINOPHEN 7.5-325 MG PO TABS: 1.0000 | ORAL_TABLET | Freq: Three times a day (TID) | ORAL | 0 refills | Status: DC | PRN

## 2019-12-20 ENCOUNTER — Other Ambulatory Visit: Payer: Medicare HMO

## 2019-12-26 ENCOUNTER — Other Ambulatory Visit: Payer: Self-pay | Admitting: Pharmacist

## 2019-12-26 DIAGNOSIS — T82868S Thrombosis of vascular prosthetic devices, implants and grafts, sequela: Secondary | ICD-10-CM

## 2019-12-27 ENCOUNTER — Other Ambulatory Visit: Payer: Self-pay | Admitting: Internal Medicine

## 2019-12-27 DIAGNOSIS — G8929 Other chronic pain: Secondary | ICD-10-CM

## 2019-12-27 MED ORDER — OXYCODONE-ACETAMINOPHEN 7.5-325 MG PO TABS: 1.0000 | ORAL_TABLET | Freq: Three times a day (TID) | ORAL | 0 refills | Status: DC | PRN

## 2019-12-27 NOTE — Telephone Encounter (Signed)
Need refill on oxyCODONE-acetaminophen (PERCOCET) 7.5-325 MG tablet ;pt contact [phone]  [id number] - [place] ([place]), [place] - [address] 

## 2020-01-07 ENCOUNTER — Ambulatory Visit (INDEPENDENT_AMBULATORY_CARE_PROVIDER_SITE_OTHER): Payer: Medicare HMO | Admitting: Pharmacist

## 2020-01-07 ENCOUNTER — Other Ambulatory Visit: Payer: Self-pay

## 2020-01-07 DIAGNOSIS — I779 Disorder of arteries and arterioles, unspecified: Secondary | ICD-10-CM

## 2020-01-07 DIAGNOSIS — Z5181 Encounter for therapeutic drug level monitoring: Secondary | ICD-10-CM | POA: Diagnosis not present

## 2020-01-07 DIAGNOSIS — T82868S Thrombosis of vascular prosthetic devices, implants and grafts, sequela: Secondary | ICD-10-CM

## 2020-01-07 DIAGNOSIS — Z7901 Long term (current) use of anticoagulants: Secondary | ICD-10-CM

## 2020-01-07 LAB — POCT INR: INR: 2 (ref 2.0–3.0)
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
## 2020-01-07 NOTE — Patient Instructions (Signed)
Patient instructed to take medications as defined in the Anti-coagulation Track section of this encounter.  Patient instructed to take today's dose.  Patient instructed to take one (1) of your 4mg blue colored warfarin tablets, once-daily at 6PM. Patient verbalized understanding of these instructions.   

## 2020-01-31 ENCOUNTER — Other Ambulatory Visit: Payer: Self-pay

## 2020-01-31 DIAGNOSIS — M545 Low back pain, unspecified: Secondary | ICD-10-CM

## 2020-01-31 DIAGNOSIS — G8929 Other chronic pain: Secondary | ICD-10-CM

## 2020-01-31 MED ORDER — OXYCODONE-ACETAMINOPHEN 7.5-325 MG PO TABS: 1.0000 | ORAL_TABLET | Freq: Three times a day (TID) | ORAL | 0 refills | Status: DC | PRN

## 2020-01-31 NOTE — Telephone Encounter (Signed)
oxyCODONE-acetaminophen (PERCOCET) 7.5-325 MG tablet, refill request @  [place] ([place]), [place] - [address] [phone] (Phone) [phone] (Fax)

## 2020-01-31 NOTE — Telephone Encounter (Signed)
Last rx written  12/27/19. Last OV 10/05/19. Next OV  Has not been scheduled. UDS 02/17/18.

## 2020-02-04 ENCOUNTER — Ambulatory Visit: Payer: Medicare HMO

## 2020-02-11 ENCOUNTER — Ambulatory Visit (INDEPENDENT_AMBULATORY_CARE_PROVIDER_SITE_OTHER): Payer: Medicare HMO | Admitting: Pharmacist

## 2020-02-11 DIAGNOSIS — Z5181 Encounter for therapeutic drug level monitoring: Secondary | ICD-10-CM

## 2020-02-11 DIAGNOSIS — I779 Disorder of arteries and arterioles, unspecified: Secondary | ICD-10-CM

## 2020-02-11 DIAGNOSIS — Z86718 Personal history of other venous thrombosis and embolism: Secondary | ICD-10-CM

## 2020-02-11 DIAGNOSIS — Z7901 Long term (current) use of anticoagulants: Secondary | ICD-10-CM

## 2020-02-11 DIAGNOSIS — T82868S Thrombosis of vascular prosthetic devices, implants and grafts, sequela: Secondary | ICD-10-CM

## 2020-02-11 LAB — POCT INR: INR: 2.1 (ref 2.0–3.0)
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
## 2020-02-11 NOTE — Patient Instructions (Signed)
Patient instructed to take medications as defined in the Anti-coagulation Track section of this encounter.  Patient instructed to take today's dose.  Patient instructed to take one (1) of your 4mg  blue colored warfarin tablets, once-daily at 6PM--EXCEPT on MONDAYS and THURSDAYS, take one and one-half (1 and 1/2) tablets of your 4mg  blue colored warfarin tablets on these days.   Patient verbalized understanding of these instructions.

## 2020-03-10 ENCOUNTER — Other Ambulatory Visit: Payer: Self-pay | Admitting: Internal Medicine

## 2020-03-10 ENCOUNTER — Ambulatory Visit (INDEPENDENT_AMBULATORY_CARE_PROVIDER_SITE_OTHER): Payer: Medicare HMO | Admitting: Pharmacist

## 2020-03-10 DIAGNOSIS — I779 Disorder of arteries and arterioles, unspecified: Secondary | ICD-10-CM | POA: Diagnosis not present

## 2020-03-10 DIAGNOSIS — G8929 Other chronic pain: Secondary | ICD-10-CM

## 2020-03-10 DIAGNOSIS — Z7901 Long term (current) use of anticoagulants: Secondary | ICD-10-CM

## 2020-03-10 DIAGNOSIS — Z5181 Encounter for therapeutic drug level monitoring: Secondary | ICD-10-CM | POA: Diagnosis not present

## 2020-03-10 DIAGNOSIS — T82868S Thrombosis of vascular prosthetic devices, implants and grafts, sequela: Secondary | ICD-10-CM | POA: Diagnosis not present

## 2020-03-10 LAB — POCT INR: INR: 2.9 (ref 2.0–3.0)

## 2020-03-10 MED ORDER — WARFARIN SODIUM 4 MG PO TABS: ORAL_TABLET | ORAL | 4 refills | Status: DC

## 2020-03-10 MED ORDER — OXYCODONE-ACETAMINOPHEN 7.5-325 MG PO TABS: 1.0000 | ORAL_TABLET | Freq: Three times a day (TID) | ORAL | 0 refills | Status: DC | PRN

## 2020-03-10 NOTE — Patient Instructions (Signed)
Patient instructed to take medications as defined in the Anti-coagulation Track section of this encounter.  Patient instructed to take today's dose.  Patient instructed to take one (1) of your 4mg  blue colored warfarin tablets, once-daily at 6PM--EXCEPT on [facility], take one and one-half (1 and 1/2) tablets of your 4mg  blue colored warfarin tablets on Wednesdays.  Patient verbalized understanding of these instructions.
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
## 2020-03-10 NOTE — Telephone Encounter (Signed)
Last rx written  01/31/20. Last OV  10/05/19. Next OV  04/11/20. UDS  02/17/18.

## 2020-03-10 NOTE — Telephone Encounter (Signed)
REFILL REQUEST  oxyCODONE-acetaminophen (PERCOCET) 7.5-325 MG tablet  [place] ([place]), [place] - [address] [phone] (Phone) [phone] (Fax)

## 2020-04-07 ENCOUNTER — Other Ambulatory Visit: Payer: Self-pay | Admitting: Internal Medicine

## 2020-04-07 ENCOUNTER — Ambulatory Visit (INDEPENDENT_AMBULATORY_CARE_PROVIDER_SITE_OTHER): Payer: Medicare HMO | Admitting: Pharmacist

## 2020-04-07 DIAGNOSIS — G8929 Other chronic pain: Secondary | ICD-10-CM

## 2020-04-07 DIAGNOSIS — T82868S Thrombosis of vascular prosthetic devices, implants and grafts, sequela: Secondary | ICD-10-CM

## 2020-04-07 DIAGNOSIS — Z7901 Long term (current) use of anticoagulants: Secondary | ICD-10-CM | POA: Diagnosis not present

## 2020-04-07 DIAGNOSIS — I779 Disorder of arteries and arterioles, unspecified: Secondary | ICD-10-CM

## 2020-04-07 LAB — POCT INR: INR: 2.1 (ref 2.0–3.0)
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
## 2020-04-07 NOTE — Telephone Encounter (Signed)
REFILL REQUEST  oxyCODONE-acetaminophen (PERCOCET) 7.5-325 MG tablet  [place] ([place]), [place] - [address] [phone] (Phone) [phone] (Fax)

## 2020-04-07 NOTE — Patient Instructions (Signed)
Patient instructed to take medications as defined in the Anti-coagulation Track section of this encounter.  Patient instructed to take today's dose.  Patient instructed to take one (1) of your 4mg  blue colored warfarin tablets, once-daily at 6PM--EXCEPT on MONDAYS, WEDNESDAYS and FRIDAYS, take one and one-half (1 and 1/2) tablets of your 4mg  blue colored warfarin tablets on these days.  Patient verbalized understanding of these instructions.

## 2020-04-08 MED ORDER — OXYCODONE-ACETAMINOPHEN 7.5-325 MG PO TABS: 1.0000 | ORAL_TABLET | Freq: Three times a day (TID) | ORAL | 0 refills | Status: DC | PRN

## 2020-04-10 NOTE — Progress Notes (Signed)
INTERNAL MEDICINE TEACHING ATTENDING ADDENDUM - Tysheka Fanguy M.D  °Duration- indefinite, Indication- recurrent vascular graft thrombosis, INR- therapeutic. Agree with pharmacy recommendations as outlined in their note.  ° ° ° °

## 2020-04-11 ENCOUNTER — Ambulatory Visit (INDEPENDENT_AMBULATORY_CARE_PROVIDER_SITE_OTHER): Payer: Medicare HMO | Admitting: Internal Medicine

## 2020-04-11 ENCOUNTER — Encounter: Payer: Self-pay | Admitting: Internal Medicine

## 2020-04-11 DIAGNOSIS — I10 Essential (primary) hypertension: Secondary | ICD-10-CM

## 2020-04-11 DIAGNOSIS — I743 Embolism and thrombosis of arteries of the lower extremities: Secondary | ICD-10-CM

## 2020-04-11 DIAGNOSIS — M810 Age-related osteoporosis without current pathological fracture: Secondary | ICD-10-CM

## 2020-04-11 DIAGNOSIS — I779 Disorder of arteries and arterioles, unspecified: Secondary | ICD-10-CM

## 2020-04-11 DIAGNOSIS — F331 Major depressive disorder, recurrent, moderate: Secondary | ICD-10-CM | POA: Diagnosis not present

## 2020-04-11 DIAGNOSIS — T82868S Thrombosis of vascular prosthetic devices, implants and grafts, sequela: Secondary | ICD-10-CM

## 2020-04-11 DIAGNOSIS — K648 Other hemorrhoids: Secondary | ICD-10-CM

## 2020-04-11 DIAGNOSIS — J449 Chronic obstructive pulmonary disease, unspecified: Secondary | ICD-10-CM

## 2020-04-11 DIAGNOSIS — J439 Emphysema, unspecified: Secondary | ICD-10-CM | POA: Diagnosis not present

## 2020-04-11 DIAGNOSIS — T82898S Other specified complication of vascular prosthetic devices, implants and grafts, sequela: Secondary | ICD-10-CM

## 2020-04-11 DIAGNOSIS — F1190 Opioid use, unspecified, uncomplicated: Secondary | ICD-10-CM

## 2020-04-11 NOTE — Progress Notes (Signed)
   Subjective:    Patient ID: Kristin Knight, female    DOB: January 16, 1946, 74 y.o.   MRN: [id number]  CC: follow up for HTN and PVD  HPI   Kristin Knight is a 74 year old woman with PMH of MDD (no meds), HTN, PAD, OP, COPD who presents for follow up.   She reports that she is doing well. She is taking all of her medications as prescribed and she has no complaints.  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  She has received her COVID vaccines.      Review of Systems  Constitutional: Negative for activity change, appetite change and fatigue.  Respiratory: Negative for cough, choking and shortness of breath.   Cardiovascular: Negative for chest pain and leg swelling.  Gastrointestinal: Negative for abdominal distention, constipation and diarrhea.  Genitourinary: Negative for difficulty urinating and dysuria.  Musculoskeletal: Negative for arthralgias, back pain and joint swelling.  Neurological: Negative for dizziness and weakness.  Psychiatric/Behavioral: Negative for decreased concentration and dysphoric mood.       Objective:   Physical Exam Vitals and nursing note reviewed.  Constitutional:      General: She is not in acute distress.    Appearance: She is not toxic-appearing.  HENT:     Head: Normocephalic and atraumatic.  Cardiovascular:     Rate and Rhythm: Normal rate.     Pulses: Normal pulses.     Heart sounds: No murmur.  Pulmonary:     Effort: Pulmonary effort is normal. No respiratory distress.     Breath sounds: No wheezing.  Abdominal:     General: Bowel sounds are normal.     Palpations: Abdomen is soft.  Musculoskeletal:        General: No swelling or tenderness.  Skin:    General: Skin is warm and dry.  Neurological:     Mental Status: She is alert and oriented to person, place, and time. Mental status is at baseline.  Psychiatric:        Mood and Affect: Mood normal.        Behavior: Behavior normal.     No labs today.         Assessment & Plan:  Return in 4-6 months

## 2020-04-11 NOTE — Patient Instructions (Signed)
Kristin Knight - -  I am glad to hear that you are doing so well!  Please continue all of your medications as you have been taking them.   Please come back to see me in 3-6 months, sooner if you have any issues or problems to discuss.   Thank you!

## 2020-04-15 NOTE — Assessment & Plan Note (Signed)
She is on plavix and warfarin without issue.  She has no complaints at this time.    Plan Continue plavix.

## 2020-04-15 NOTE — Assessment & Plan Note (Signed)
She is doing well on current therapy which is intermittent albuterol.  No recent exacerbation.   Continue PRN albuterol.

## 2020-04-15 NOTE — Assessment & Plan Note (Signed)
She is taking alendronate and calcium with vitamin D.  She has had no recent falls, no recent fractures.  Last DEXA was 2015.  Consider repeat DEXA, discuss at next visit.

## 2020-04-15 NOTE — Assessment & Plan Note (Signed)
BP was 145/85. She has been controlled on this regimen previously.  If remains elevated at next check will discuss increasing medications.    Plan Continue lisinopril, triamterine-hctz combination

## 2020-04-15 NOTE — Assessment & Plan Note (Signed)
She has no complaints of bleeding at this time.  She is doing well, no weakness or fainting.  Last CBC did not show anemia.

## 2020-04-15 NOTE — Assessment & Plan Note (Signed)
She is on warfarin without issues.  Her last INRs have been at goal.   Plan Continue warfarin and coumadin clinic follow up.

## 2020-04-15 NOTE — Assessment & Plan Note (Signed)
She is doing well on current therapy.  We reviewed her dosing.  She is able to use the medication to remain employed.  She will need an updated medication contract and UDS at next visit.

## 2020-04-15 NOTE — Assessment & Plan Note (Signed)
She is doing well.  No issues at this time.  No worsening of mood.  She declined PHQ-9 today.  She will continue her current dose of mirtazapine.

## 2020-04-18 ENCOUNTER — Other Ambulatory Visit: Payer: Self-pay | Admitting: Pharmacist

## 2020-04-18 DIAGNOSIS — T82868S Thrombosis of vascular prosthetic devices, implants and grafts, sequela: Secondary | ICD-10-CM

## 2020-04-21 ENCOUNTER — Other Ambulatory Visit: Payer: Self-pay | Admitting: Pharmacist

## 2020-04-21 ENCOUNTER — Telehealth: Payer: Self-pay

## 2020-04-21 DIAGNOSIS — T82868S Thrombosis of vascular prosthetic devices, implants and grafts, sequela: Secondary | ICD-10-CM

## 2020-04-21 NOTE — Telephone Encounter (Signed)
Pt was transferred to [facility] by mistake.  Pt states she has repeatedly been calling [phone] and no one answers except a recording to request a refill on her Warfarin prescription.  Pt states Dr Jorene Guest usually manages and montiors her Warfarin advised pt I would forward a message to him with her request to refill her Warfarin rx to the [facility] in [facility].

## 2020-04-21 NOTE — Telephone Encounter (Signed)
Refill Request  warfarin (COUMADIN) 4 MG tablet  [facility] - [place] ([place]), [place] - [address]

## 2020-04-29 ENCOUNTER — Other Ambulatory Visit: Payer: Self-pay | Admitting: Internal Medicine

## 2020-04-29 ENCOUNTER — Other Ambulatory Visit: Payer: Self-pay | Admitting: *Deleted

## 2020-04-29 DIAGNOSIS — G8929 Other chronic pain: Secondary | ICD-10-CM

## 2020-04-29 DIAGNOSIS — I779 Disorder of arteries and arterioles, unspecified: Secondary | ICD-10-CM

## 2020-04-29 MED ORDER — OXYCODONE-ACETAMINOPHEN 7.5-325 MG PO TABS: 1.0000 | ORAL_TABLET | Freq: Three times a day (TID) | ORAL | 0 refills | Status: DC | PRN

## 2020-04-29 NOTE — Telephone Encounter (Signed)
Refill Request  oxyCODONE-acetaminophen (PERCOCET) 7.5-325 MG tablet  [facility] - [place] ([place]), [place] - [address] 

## 2020-05-05 ENCOUNTER — Other Ambulatory Visit: Payer: Self-pay

## 2020-05-05 ENCOUNTER — Ambulatory Visit (HOSPITAL_COMMUNITY)
Admission: RE | Admit: 2020-05-05 | Discharge: 2020-05-05 | Disposition: A | Discharge status: Home / Self Care | Payer: Medicare HMO | Source: Ambulatory Visit | Attending: Surgery | Admitting: Surgery

## 2020-05-05 ENCOUNTER — Ambulatory Visit (INDEPENDENT_AMBULATORY_CARE_PROVIDER_SITE_OTHER): Payer: Medicare HMO | Admitting: Physician Assistant

## 2020-05-05 ENCOUNTER — Ambulatory Visit (INDEPENDENT_AMBULATORY_CARE_PROVIDER_SITE_OTHER)
Admission: RE | Admit: 2020-05-05 | Discharge: 2020-05-05 | Disposition: A | Discharge status: Home / Self Care | Payer: Medicare HMO | Source: Ambulatory Visit | Attending: Surgery | Admitting: Surgery

## 2020-05-05 ENCOUNTER — Ambulatory Visit: Payer: Medicare HMO | Admitting: Pharmacist

## 2020-05-05 VITALS — BP 138/82 | HR 58 | Temp 98.2°F | Resp 20 | Ht 64.0 in | Wt 122.2 lb

## 2020-05-05 DIAGNOSIS — Z7901 Long term (current) use of anticoagulants: Secondary | ICD-10-CM

## 2020-05-05 DIAGNOSIS — I779 Disorder of arteries and arterioles, unspecified: Secondary | ICD-10-CM

## 2020-05-05 DIAGNOSIS — T82868S Thrombosis of vascular prosthetic devices, implants and grafts, sequela: Secondary | ICD-10-CM

## 2020-05-05 LAB — POCT INR: INR: 3.8 — AB (ref 2.0–3.0)

## 2020-05-05 MED ORDER — CLOPIDOGREL BISULFATE 75 MG PO TABS: 75.0000 mg | ORAL_TABLET | Freq: Every day | ORAL | 6 refills | Status: DC

## 2020-05-05 NOTE — Progress Notes (Signed)
INTERNAL MEDICINE TEACHING ATTENDING ADDENDUM ° °I agree with pharmacy recommendations as outlined in their note.  ° °-Annamaria Salah MD ° °

## 2020-05-05 NOTE — Addendum Note (Signed)
Addended by: Barbie Banner on: 05/05/2020 12:46 PM   Modules accepted: Orders
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
## 2020-05-05 NOTE — Patient Instructions (Signed)
Patient instructed to take medications as defined in the Anti-coagulation Track section of this encounter.  °Patient instructed to take today's dose.  °Patient instructed to take one (1) of your 4mg blue colored warfarin tablets, once-daily at 6PM--EXCEPT on WEDNESDAYS take one and one-half (1 and 1/2) tablets of your 4mg blue colored warfarin tablets on Wednesdays.   °Patient verbalized understanding of these instructions.  ° °

## 2020-05-07 ENCOUNTER — Other Ambulatory Visit: Payer: Self-pay | Admitting: *Deleted

## 2020-05-07 DIAGNOSIS — I779 Disorder of arteries and arterioles, unspecified: Secondary | ICD-10-CM

## 2020-05-07 DIAGNOSIS — Z95828 Presence of other vascular implants and grafts: Secondary | ICD-10-CM

## 2020-05-12 ENCOUNTER — Other Ambulatory Visit: Payer: Self-pay | Admitting: *Deleted

## 2020-05-12 DIAGNOSIS — I10 Essential (primary) hypertension: Secondary | ICD-10-CM

## 2020-05-14 MED ORDER — LISINOPRIL 20 MG PO TABS: 20.0000 mg | ORAL_TABLET | Freq: Every day | ORAL | 3 refills | Status: DC

## 2020-05-29 ENCOUNTER — Other Ambulatory Visit: Payer: Self-pay

## 2020-05-29 DIAGNOSIS — G8929 Other chronic pain: Secondary | ICD-10-CM

## 2020-05-29 NOTE — Telephone Encounter (Signed)
oxyCODONE-acetaminophen (PERCOCET) 7.5-325 MG tablet, refill request @  [id number] - [place] ([place] - [address] Phone:  [phone]  Fax:  [phone]      

## 2020-05-30 MED ORDER — OXYCODONE-ACETAMINOPHEN 7.5-325 MG PO TABS: 1.0000 | ORAL_TABLET | Freq: Three times a day (TID) | ORAL | 0 refills | Status: DC | PRN

## 2020-06-02 ENCOUNTER — Ambulatory Visit (INDEPENDENT_AMBULATORY_CARE_PROVIDER_SITE_OTHER): Payer: Medicare HMO | Admitting: Pharmacist

## 2020-06-02 DIAGNOSIS — T82868S Thrombosis of vascular prosthetic devices, implants and grafts, sequela: Secondary | ICD-10-CM

## 2020-06-02 DIAGNOSIS — T82868D Thrombosis of vascular prosthetic devices, implants and grafts, subsequent encounter: Secondary | ICD-10-CM

## 2020-06-02 DIAGNOSIS — Z7901 Long term (current) use of anticoagulants: Secondary | ICD-10-CM

## 2020-06-02 DIAGNOSIS — I779 Disorder of arteries and arterioles, unspecified: Secondary | ICD-10-CM

## 2020-06-02 LAB — POCT INR: INR: 2.2 (ref 2.0–3.0)

## 2020-06-02 MED ORDER — WARFARIN SODIUM 4 MG PO TABS: ORAL_TABLET | ORAL | 2 refills | Status: DC

## 2020-06-02 NOTE — Patient Instructions (Signed)
Patient instructed to take medications as defined in the Anti-coagulation Track section of this encounter.  °Patient instructed to take today's dose.  °Patient instructed to take one (1) of your 4mg blue colored warfarin tablets, once-daily at 6PM--EXCEPT on WEDNESDAYS take one and one-half (1 and 1/2) tablets of your 4mg blue colored warfarin tablets on Wednesdays.   °Patient verbalized understanding of these instructions.  ° °
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
## 2020-06-03 NOTE — Progress Notes (Signed)
Agree with Dr. Gladstone Pih plan as documented.

## 2020-06-30 ENCOUNTER — Ambulatory Visit (INDEPENDENT_AMBULATORY_CARE_PROVIDER_SITE_OTHER): Payer: Medicare HMO | Admitting: Pharmacist

## 2020-06-30 DIAGNOSIS — Z7901 Long term (current) use of anticoagulants: Secondary | ICD-10-CM | POA: Diagnosis not present

## 2020-06-30 DIAGNOSIS — I779 Disorder of arteries and arterioles, unspecified: Secondary | ICD-10-CM

## 2020-06-30 DIAGNOSIS — T82868S Thrombosis of vascular prosthetic devices, implants and grafts, sequela: Secondary | ICD-10-CM | POA: Diagnosis not present

## 2020-06-30 LAB — POCT INR: INR: 2.3 (ref 2.0–3.0)
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
## 2020-06-30 NOTE — Patient Instructions (Signed)
Patient instructed to take medications as defined in the Anti-coagulation Track section of this encounter.  Patient instructed to take today's dose.  Patient instructed to take one (1) of your 4mg  blue colored warfarin tablets, once-daily at 6PM--EXCEPT on [facility] take one and one-half (1 and 1/2) tablets of your 4mg  blue colored warfarin tablets on Wednesdays.   Patient verbalized understanding of these instructions.

## 2020-07-08 ENCOUNTER — Telehealth: Payer: Self-pay | Admitting: Internal Medicine

## 2020-07-08 DIAGNOSIS — M545 Low back pain, unspecified: Secondary | ICD-10-CM

## 2020-07-08 NOTE — Telephone Encounter (Signed)
Need refill on oxyCODONE-acetaminophen (PERCOCET) 7.5-325 MG tablet ;pt contact [phone]  [id number] - [place] ([place]), [place] - [address] 

## 2020-07-09 MED ORDER — OXYCODONE-ACETAMINOPHEN 7.5-325 MG PO TABS: 1.0000 | ORAL_TABLET | Freq: Three times a day (TID) | ORAL | 0 refills | Status: DC | PRN

## 2020-07-09 NOTE — Telephone Encounter (Signed)
Rx sent 

## 2020-07-28 ENCOUNTER — Ambulatory Visit: Payer: Medicare HMO

## 2020-08-04 ENCOUNTER — Other Ambulatory Visit: Payer: Self-pay

## 2020-08-04 ENCOUNTER — Other Ambulatory Visit: Payer: Self-pay | Admitting: Internal Medicine

## 2020-08-04 ENCOUNTER — Ambulatory Visit: Payer: Medicare HMO

## 2020-08-04 ENCOUNTER — Ambulatory Visit (INDEPENDENT_AMBULATORY_CARE_PROVIDER_SITE_OTHER): Payer: Medicare HMO | Admitting: Pharmacist

## 2020-08-04 DIAGNOSIS — T82868D Thrombosis of vascular prosthetic devices, implants and grafts, subsequent encounter: Secondary | ICD-10-CM | POA: Diagnosis not present

## 2020-08-04 DIAGNOSIS — Z7901 Long term (current) use of anticoagulants: Secondary | ICD-10-CM

## 2020-08-04 DIAGNOSIS — Z23 Encounter for immunization: Secondary | ICD-10-CM

## 2020-08-04 DIAGNOSIS — I779 Disorder of arteries and arterioles, unspecified: Secondary | ICD-10-CM

## 2020-08-04 DIAGNOSIS — G8929 Other chronic pain: Secondary | ICD-10-CM

## 2020-08-04 LAB — POCT INR: INR: 3.3 — AB (ref 2.0–3.0)
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
## 2020-08-04 NOTE — Telephone Encounter (Signed)
Need refill on oxyCODONE-acetaminophen (PERCOCET) 7.5-325 MG tablet ;pt contact [phone]  [id number] - [place] ([place]), [place] - [address] 

## 2020-08-04 NOTE — Patient Instructions (Signed)
Patient instructed to take medications as defined in the Anti-coagulation Track section of this encounter.  Patient instructed to take today's dose.  Patient instructed to take one (1) of your 4mg  blue colored warfarin tablets, once-daily at [facility]. Patient verbalized understanding of these instructions.

## 2020-08-05 MED ORDER — OXYCODONE-ACETAMINOPHEN 7.5-325 MG PO TABS: 1.0000 | ORAL_TABLET | Freq: Three times a day (TID) | ORAL | 0 refills | Status: DC | PRN

## 2020-08-06 NOTE — Progress Notes (Signed)
INTERNAL MEDICINE TEACHING ATTENDING ADDENDUM - Howell Groesbeck M.D  Duration- indefinite, Indication- recurrent thrombosis of graft, INR- supratherapeutic. Agree with pharmacy recommendations as outlined in their note.

## 2020-09-01 ENCOUNTER — Ambulatory Visit (INDEPENDENT_AMBULATORY_CARE_PROVIDER_SITE_OTHER): Payer: Medicare HMO | Admitting: Pharmacist

## 2020-09-01 ENCOUNTER — Other Ambulatory Visit: Payer: Self-pay

## 2020-09-01 DIAGNOSIS — T82868S Thrombosis of vascular prosthetic devices, implants and grafts, sequela: Secondary | ICD-10-CM | POA: Diagnosis not present

## 2020-09-01 DIAGNOSIS — Z7901 Long term (current) use of anticoagulants: Secondary | ICD-10-CM | POA: Diagnosis not present

## 2020-09-01 DIAGNOSIS — I779 Disorder of arteries and arterioles, unspecified: Secondary | ICD-10-CM | POA: Diagnosis not present

## 2020-09-01 LAB — POCT INR: INR: 3.6 — AB (ref 2.0–3.0)

## 2020-09-01 NOTE — Patient Instructions (Signed)
Patient instructed to take medications as defined in the Anti-coagulation Track section of this encounter.  Patient instructed to take today's dose.  Patient instructed to take one (1) of your 4mg  blue colored warfarin tablets, once-daily at 6PM--EXCEPT on MONDAYS and THURSDAYS, take ONLY one-half (1/2) of your 4mg  blue colored warfarin tablets. Patient verbalized understanding of these instructions.
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
## 2020-09-03 NOTE — Progress Notes (Signed)
INTERNAL MEDICINE TEACHING ATTENDING ADDENDUM - Kristin Knight M.D  Duration- indefinite, Indication- recurrent vascular draft thrombosis, INR- supratherapeutic. Agree with pharmacy recommendations as outlined in their note.

## 2020-09-04 ENCOUNTER — Other Ambulatory Visit: Payer: Self-pay

## 2020-09-04 DIAGNOSIS — G8929 Other chronic pain: Secondary | ICD-10-CM

## 2020-09-04 MED ORDER — OXYCODONE-ACETAMINOPHEN 7.5-325 MG PO TABS: 1.0000 | ORAL_TABLET | Freq: Three times a day (TID) | ORAL | 0 refills | Status: DC | PRN

## 2020-09-04 NOTE — Telephone Encounter (Signed)
oxyCODONE-acetaminophen (PERCOCET) 7.5-325 MG tablet, refill request @  [place] ([place]), [place] - [address] Phone:  [phone]  Fax:  [phone]

## 2020-09-25 ENCOUNTER — Other Ambulatory Visit: Payer: Self-pay | Admitting: Internal Medicine

## 2020-09-25 ENCOUNTER — Other Ambulatory Visit: Payer: Self-pay | Admitting: Pharmacist

## 2020-09-25 DIAGNOSIS — I10 Essential (primary) hypertension: Secondary | ICD-10-CM

## 2020-09-25 DIAGNOSIS — T82868S Thrombosis of vascular prosthetic devices, implants and grafts, sequela: Secondary | ICD-10-CM

## 2020-09-29 ENCOUNTER — Ambulatory Visit: Payer: Medicare HMO

## 2020-10-06 ENCOUNTER — Other Ambulatory Visit: Payer: Self-pay | Admitting: Internal Medicine

## 2020-10-06 ENCOUNTER — Ambulatory Visit (INDEPENDENT_AMBULATORY_CARE_PROVIDER_SITE_OTHER): Payer: Medicare HMO | Admitting: Pharmacist

## 2020-10-06 DIAGNOSIS — T82868S Thrombosis of vascular prosthetic devices, implants and grafts, sequela: Secondary | ICD-10-CM

## 2020-10-06 DIAGNOSIS — Z7901 Long term (current) use of anticoagulants: Secondary | ICD-10-CM | POA: Diagnosis not present

## 2020-10-06 DIAGNOSIS — I779 Disorder of arteries and arterioles, unspecified: Secondary | ICD-10-CM

## 2020-10-06 DIAGNOSIS — G8929 Other chronic pain: Secondary | ICD-10-CM

## 2020-10-06 LAB — POCT INR: INR: 2.5 (ref 2.0–3.0)

## 2020-10-06 NOTE — Patient Instructions (Signed)
Patient instructed to take medications as defined in the Anti-coagulation Track section of this encounter.  Patient instructed to take today's dose.  Patient instructed to take one (1) of your 4mg  blue colored warfarin tablets, once-daily at 6PM--EXCEPT on MONDAYS and THURSDAYS, take ONLY one-half (1/2) of your 4mg  blue colored warfarin tablets. Patient verbalized understanding of these instructions.
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
## 2020-10-06 NOTE — Telephone Encounter (Signed)
REFILL REQUEST  oxyCODONE-acetaminophen (PERCOCET) 7.5-325 MG tablet  [place] ([place]), [place] - [address] Phone:  [phone]  Fax:  [phone]

## 2020-10-07 NOTE — Progress Notes (Signed)
INTERNAL MEDICINE TEACHING ATTENDING ADDENDUM ° °I agree with pharmacy recommendations as outlined in their note.  ° °-Anina Schnake MD ° °

## 2020-10-08 MED ORDER — OXYCODONE-ACETAMINOPHEN 7.5-325 MG PO TABS: 1.0000 | ORAL_TABLET | Freq: Three times a day (TID) | ORAL | 0 refills | Status: DC | PRN

## 2020-10-08 NOTE — Telephone Encounter (Signed)
Dr. Dareen Piano - would you mind filling for me?  Appropriate refill history on PDMP.  Thank you!

## 2020-10-23 ENCOUNTER — Other Ambulatory Visit: Payer: Self-pay | Admitting: Internal Medicine

## 2020-10-23 DIAGNOSIS — Z1231 Encounter for screening mammogram for malignant neoplasm of breast: Secondary | ICD-10-CM

## 2020-11-03 ENCOUNTER — Ambulatory Visit (INDEPENDENT_AMBULATORY_CARE_PROVIDER_SITE_OTHER): Payer: Medicare HMO | Admitting: Pharmacist

## 2020-11-03 DIAGNOSIS — T82868S Thrombosis of vascular prosthetic devices, implants and grafts, sequela: Secondary | ICD-10-CM | POA: Diagnosis not present

## 2020-11-03 DIAGNOSIS — Z7901 Long term (current) use of anticoagulants: Secondary | ICD-10-CM | POA: Diagnosis not present

## 2020-11-03 DIAGNOSIS — I779 Disorder of arteries and arterioles, unspecified: Secondary | ICD-10-CM | POA: Diagnosis not present

## 2020-11-03 LAB — POCT INR: INR: 1.4 — AB (ref 2.0–3.0)
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
## 2020-11-03 NOTE — Patient Instructions (Signed)
Patient instructed to take medications as defined in the Anti-coagulation Track section of this encounter.  Patient instructed to take today's dose.  Patient instructed to take one (1) of your 4mg  strength-blue warfarin tablets, by mouth--ONCE daily at [facility].  Patient verbalized understanding of these instructions.

## 2020-11-10 ENCOUNTER — Other Ambulatory Visit: Payer: Self-pay

## 2020-11-10 DIAGNOSIS — M545 Low back pain, unspecified: Secondary | ICD-10-CM

## 2020-11-10 MED ORDER — OXYCODONE-ACETAMINOPHEN 7.5-325 MG PO TABS: 1.0000 | ORAL_TABLET | Freq: Three times a day (TID) | ORAL | 0 refills | Status: DC | PRN

## 2020-11-10 NOTE — Telephone Encounter (Signed)
oxyCODONE-acetaminophen (PERCOCET) 7.5-325 MG tablet, refill request @  [id number] - [place] ([place]), [place] - [address] Phone:  [phone]  Fax:  [phone]      

## 2020-12-04 ENCOUNTER — Ambulatory Visit: Payer: Medicare HMO

## 2020-12-08 ENCOUNTER — Other Ambulatory Visit: Payer: Self-pay | Admitting: Internal Medicine

## 2020-12-08 ENCOUNTER — Ambulatory Visit (INDEPENDENT_AMBULATORY_CARE_PROVIDER_SITE_OTHER): Payer: Medicare HMO | Admitting: Pharmacist

## 2020-12-08 DIAGNOSIS — I779 Disorder of arteries and arterioles, unspecified: Secondary | ICD-10-CM

## 2020-12-08 DIAGNOSIS — T82868S Thrombosis of vascular prosthetic devices, implants and grafts, sequela: Secondary | ICD-10-CM

## 2020-12-08 DIAGNOSIS — Z7901 Long term (current) use of anticoagulants: Secondary | ICD-10-CM | POA: Diagnosis not present

## 2020-12-08 DIAGNOSIS — G8929 Other chronic pain: Secondary | ICD-10-CM

## 2020-12-08 LAB — POCT INR: INR: 3.8 — AB (ref 2.0–3.0)

## 2020-12-08 NOTE — Telephone Encounter (Signed)
REFILL REQUEST  oxyCODONE-acetaminophen (PERCOCET) 7.5-325 MG tablet   [place] ([place]), [place] - [address] Phone:  [phone]  Fax:  [phone]     Per patient has 8 pills left.  Scheduled future appointment with Dr. Daryll Drown on 12/26/2020 at 10:45 am.

## 2020-12-08 NOTE — Telephone Encounter (Signed)
Last rx written 11/10/20. Last OV 04/11/20. Next OV 12/26/20. UDS 02/17/18.
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
## 2020-12-08 NOTE — Patient Instructions (Signed)
Patient instructed to take medications as defined in the Anti-coagulation Track section of this encounter.  Patient instructed to OMIT today's dose.  Patient instructed to take warfarin 4mg  strength blue tablets, once-daily by mouth--EXCEPT on Tuesdays and Fridays, take only one-half (1/2) tablet on these days.  Patient verbalized understanding of these instructions.

## 2020-12-09 MED ORDER — OXYCODONE-ACETAMINOPHEN 7.5-325 MG PO TABS: 1.0000 | ORAL_TABLET | Freq: Three times a day (TID) | ORAL | 0 refills | Status: DC | PRN

## 2020-12-11 ENCOUNTER — Ambulatory Visit: Payer: Medicare HMO

## 2020-12-17 NOTE — Progress Notes (Signed)
I reviewed Dr. Gladstone Pih note.  INR elevated.  Dose decreased.

## 2020-12-22 ENCOUNTER — Ambulatory Visit: Payer: Medicare HMO

## 2020-12-26 ENCOUNTER — Other Ambulatory Visit: Payer: Self-pay

## 2020-12-26 ENCOUNTER — Ambulatory Visit (INDEPENDENT_AMBULATORY_CARE_PROVIDER_SITE_OTHER): Payer: Medicare HMO | Admitting: Internal Medicine

## 2020-12-26 ENCOUNTER — Encounter: Payer: Self-pay | Admitting: Internal Medicine

## 2020-12-26 VITALS — BP 143/87 | HR 62 | Temp 98.3°F | Ht 64.0 in | Wt 111.4 lb

## 2020-12-26 DIAGNOSIS — T82868S Thrombosis of vascular prosthetic devices, implants and grafts, sequela: Secondary | ICD-10-CM | POA: Diagnosis not present

## 2020-12-26 DIAGNOSIS — J449 Chronic obstructive pulmonary disease, unspecified: Secondary | ICD-10-CM

## 2020-12-26 DIAGNOSIS — Z7901 Long term (current) use of anticoagulants: Secondary | ICD-10-CM | POA: Diagnosis not present

## 2020-12-26 DIAGNOSIS — M810 Age-related osteoporosis without current pathological fracture: Secondary | ICD-10-CM | POA: Diagnosis not present

## 2020-12-26 DIAGNOSIS — F331 Major depressive disorder, recurrent, moderate: Secondary | ICD-10-CM

## 2020-12-26 DIAGNOSIS — I743 Embolism and thrombosis of arteries of the lower extremities: Secondary | ICD-10-CM

## 2020-12-26 DIAGNOSIS — I10 Essential (primary) hypertension: Secondary | ICD-10-CM | POA: Diagnosis not present

## 2020-12-26 DIAGNOSIS — I779 Disorder of arteries and arterioles, unspecified: Secondary | ICD-10-CM

## 2020-12-26 DIAGNOSIS — T82898S Other specified complication of vascular prosthetic devices, implants and grafts, sequela: Secondary | ICD-10-CM

## 2020-12-26 NOTE — Progress Notes (Signed)
   Subjective:    Patient ID: Kristin Knight, female    DOB: 12/02/1945, 75 y.o.   MRN: [id number]  CC: 6 month follow up for HTN  HPI  Ms. 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   Ms. Loudon reports that she is doing well.  She is taking all of her medications as prescribed.  She had a recent elevated INR, but she has not noticed any bleeding or weakness.  She has been out of her lisinopril for 3 days and has only been taking her triamterine-hctz.  She has no chest pain, breathing issues or other changes.  She is due for her mammogram and will schedule a DEXA scan at the same time.    She notes having a low appetite, only eating one meal per day.  She is down about 11 pounds in the last year. She has previously been much thinner than this.  She has no new symptoms today, just feels that this is normal for her.  She is due for MMG and DEXA.  She is otherwise up to date on cancer screening.  She has had no return of bleeding or abdominal swelling.     Review of Systems  Constitutional: Positive for unexpected weight change (due to low appetite). Negative for activity change and appetite change (chronically low).  Eyes: Negative for photophobia and visual disturbance.  Respiratory: Negative for cough and shortness of breath.   Cardiovascular: Negative for chest pain and palpitations.  Gastrointestinal: Negative for abdominal distention, abdominal pain, blood in stool and diarrhea.  Musculoskeletal: Positive for arthralgias. Negative for back pain and gait problem.  Neurological: Negative for dizziness, weakness and headaches.  Psychiatric/Behavioral: Negative for decreased concentration and dysphoric mood.       Objective:   Physical Exam Vitals and nursing note reviewed.  Constitutional:      General: She is not in acute distress.    Appearance:  Normal appearance. She is not toxic-appearing.  HENT:     Head: Normocephalic and atraumatic.  Cardiovascular:     Rate and Rhythm: Normal rate and regular rhythm.     Heart sounds: No murmur heard.   Pulmonary:     Effort: Pulmonary effort is normal. No respiratory distress.  Abdominal:     General: Abdomen is flat. Bowel sounds are normal. There is no distension.     Palpations: Abdomen is soft.  Musculoskeletal:        General: No swelling or tenderness.     Right lower leg: No edema.     Left lower leg: No edema.  Skin:    General: Skin is warm and dry.  Neurological:     General: No focal deficit present.     Mental Status: She is alert. Mental status is at baseline.  Psychiatric:        Mood and Affect: Mood normal.        Behavior: Behavior normal.      CMET and CBC today     Assessment & Plan:  Return in 6 months, return to coumadin clinic as scheduled.

## 2020-12-26 NOTE — Patient Instructions (Addendum)
Kristin Knight - -  Thank you for coming in today.  Your blood pressure is a little high, we will make sure your lisinopril is at the pharmacy.    We will check some blood work today.  Given your decreased appetite, if anything is abnormal, I will call you.   Thank you!    Please come back in 6 months, sooner if needed

## 2020-12-27 LAB — CMP14 + ANION GAP
ALT: 7 IU/L (ref 0–32)
AST: 15 IU/L (ref 0–40)
Albumin/Globulin Ratio: 1.8 (ref 1.2–2.2)
Albumin: 4.5 g/dL (ref 3.7–4.7)
Alkaline Phosphatase: 53 IU/L (ref 44–121)
Anion Gap: 15 mmol/L (ref 10.0–18.0)
BUN/Creatinine Ratio: 20 (ref 12–28)
BUN: 20 mg/dL (ref 8–27)
Bilirubin Total: <0.2 mg/dL (ref 0.0–1.2)
CO2: 27 mmol/L (ref 20–29)
Calcium: 9.6 mg/dL (ref 8.7–10.3)
Chloride: 100 mmol/L (ref 96–106)
Creatinine, Ser: 1.02 mg/dL — ABNORMAL HIGH (ref 0.57–1.00)
GFR calc Af Amer: 63 mL/min/{1.73_m2} (ref >59)
GFR calc non Af Amer: 55 mL/min/{1.73_m2} — ABNORMAL LOW (ref >59)
Globulin, Total: 2.5 g/dL (ref 1.5–4.5)
Glucose: 112 mg/dL — ABNORMAL HIGH (ref 65–99)
Potassium: 3.7 mmol/L (ref 3.5–5.2)
Sodium: 142 mmol/L (ref 134–144)
Total Protein: 7 g/dL (ref 6.0–8.5)

## 2020-12-27 LAB — CBC
Hematocrit: 39.2 % (ref 34.0–46.6)
Hemoglobin: 12.4 g/dL (ref 11.1–15.9)
MCH: 28 pg (ref 26.6–33.0)
MCHC: 31.6 g/dL (ref 31.5–35.7)
MCV: 89 fL (ref 79–97)
Platelets: 196 10*3/uL (ref 150–450)
RBC: 4.43 x10E6/uL (ref 3.77–5.28)
RDW: 12.6 % (ref 11.7–15.4)
WBC: 3.9 10*3/uL (ref 3.4–10.8)

## 2020-12-29 ENCOUNTER — Encounter: Payer: Self-pay | Admitting: Internal Medicine

## 2020-12-29 NOTE — Assessment & Plan Note (Addendum)
BP was a little high today.  Unfortunately, it was not rechecked prior to the patient leaving the clinic . She is on a 3 drug regimen and has been controlled on this regimen in the past.  Will plan to continue with this regimen for now and reassess at next visit.  She did report being out of her lisinopril for 3 days, and we will call the pharmacy and confirm that she has refills.   Plan Continue lisinopril, triamterine-hctz Check CMET today

## 2020-12-29 NOTE — Assessment & Plan Note (Signed)
She continues on coumadin therapy, no acute issues today.

## 2020-12-29 NOTE — Assessment & Plan Note (Signed)
She is taking alendronate, calcium and vitamin D.  She is due for a repeat DEXA scan to check for stabilization of bone findings.  She had to miss her last MMG, so we will plan to order the DEXA to be done at the same time as the MMG.    Order for DEXA placed today Continue alendronate and Ca-Vitamin D

## 2020-12-29 NOTE — Assessment & Plan Note (Signed)
She is doing well on albuterol only.  She has no complaints today and no wheezing on exam.   Plan Continue albuterol

## 2020-12-29 NOTE — Assessment & Plan Note (Signed)
She is well controlled and reports normal mood on mirtazapine therapy.   Plan Continue mirtazapine.

## 2020-12-29 NOTE — Assessment & Plan Note (Signed)
She denies pain with walking, issues with nocturnal pain or other symptoms today.  Legs are thin and without edema, pulses are palpable.  She is on plavix, warfarin and lovastatin without issue.  She continues to take all of these medications.   Plan Continue plavix, coumadin and lovastatin

## 2020-12-29 NOTE — Assessment & Plan Note (Signed)
She has been on coumadin for many years.  She has no concerning findings of pain or decreased pulses in the legs today.    Plan Continue coumadin INR follow up as per coumadin clinic follow up.

## 2021-01-20 ENCOUNTER — Other Ambulatory Visit: Payer: Self-pay

## 2021-01-20 DIAGNOSIS — G8929 Other chronic pain: Secondary | ICD-10-CM

## 2021-01-20 DIAGNOSIS — F331 Major depressive disorder, recurrent, moderate: Secondary | ICD-10-CM

## 2021-01-20 DIAGNOSIS — M545 Low back pain, unspecified: Secondary | ICD-10-CM

## 2021-01-20 MED ORDER — OXYCODONE-ACETAMINOPHEN 7.5-325 MG PO TABS: 1.0000 | ORAL_TABLET | Freq: Three times a day (TID) | ORAL | 0 refills | Status: DC | PRN

## 2021-01-20 MED ORDER — MIRTAZAPINE 30 MG PO TABS: 30.0000 mg | ORAL_TABLET | Freq: Every day | ORAL | 3 refills | Status: DC

## 2021-01-20 NOTE — Telephone Encounter (Signed)
Oxycodone Last rx written 12/09/20. Last OV 12/26/20. Next OV has not been scheduled. UDS 02/17/18.

## 2021-01-20 NOTE — Telephone Encounter (Signed)
  mirtazapine (REMERON) 30 MG tablet   oxyCODONE-acetaminophen (PERCOCET) 7.5-325 MG tablet, REFILL REQUEST @  [facility] ([place]), [place] - [address] Phone:  [phone]  Fax:  [phone]

## 2021-02-02 ENCOUNTER — Ambulatory Visit (INDEPENDENT_AMBULATORY_CARE_PROVIDER_SITE_OTHER): Payer: Medicare HMO | Admitting: Pharmacist

## 2021-02-02 DIAGNOSIS — I779 Disorder of arteries and arterioles, unspecified: Secondary | ICD-10-CM

## 2021-02-02 DIAGNOSIS — T82868S Thrombosis of vascular prosthetic devices, implants and grafts, sequela: Secondary | ICD-10-CM

## 2021-02-02 DIAGNOSIS — Z7901 Long term (current) use of anticoagulants: Secondary | ICD-10-CM

## 2021-02-02 LAB — POCT INR: INR: 2.1 (ref 2.0–3.0)
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
## 2021-02-02 NOTE — Patient Instructions (Signed)
Patient instructed to take medications as defined in the Anti-coagulation Track section of this encounter.  Patient instructed to take today's dose.  Patient instructed to take one (1) of your 4mg  blue colored warfarin tablets, once-daily at 6PM--EXCEPT on Fridays--take only one-half (1/2) of your blue colored warfarin tablets on Fridays. Patient verbalized understanding of these instructions.

## 2021-02-03 NOTE — Progress Notes (Signed)
INTERNAL MEDICINE TEACHING ATTENDING ADDENDUM ° °I agree with pharmacy recommendations as outlined in their note.  ° °-Duncan Vincent MD ° °

## 2021-02-17 ENCOUNTER — Other Ambulatory Visit: Payer: Self-pay | Admitting: Internal Medicine

## 2021-02-17 ENCOUNTER — Encounter: Payer: Self-pay | Admitting: *Deleted

## 2021-02-17 DIAGNOSIS — T82868S Thrombosis of vascular prosthetic devices, implants and grafts, sequela: Secondary | ICD-10-CM

## 2021-02-17 NOTE — Progress Notes (Signed)

## 2021-02-24 ENCOUNTER — Other Ambulatory Visit: Payer: Self-pay

## 2021-02-24 DIAGNOSIS — G8929 Other chronic pain: Secondary | ICD-10-CM

## 2021-02-24 DIAGNOSIS — M545 Low back pain, unspecified: Secondary | ICD-10-CM

## 2021-02-24 MED ORDER — OXYCODONE-ACETAMINOPHEN 7.5-325 MG PO TABS: 1.0000 | ORAL_TABLET | Freq: Three times a day (TID) | ORAL | 0 refills | Status: DC | PRN

## 2021-02-24 NOTE — Telephone Encounter (Signed)
Need refill on oxyCODONE-acetaminophen (PERCOCET) 7.5-325 MG tablet ;pt contact [facility] ([place]), [place] - [address]

## 2021-03-02 ENCOUNTER — Ambulatory Visit: Payer: Medicare HMO

## 2021-03-13 ENCOUNTER — Other Ambulatory Visit: Payer: Self-pay | Admitting: Internal Medicine

## 2021-03-13 DIAGNOSIS — T82868S Thrombosis of vascular prosthetic devices, implants and grafts, sequela: Secondary | ICD-10-CM

## 2021-03-17 ENCOUNTER — Other Ambulatory Visit: Payer: Self-pay | Admitting: Internal Medicine

## 2021-03-17 DIAGNOSIS — T82868S Thrombosis of vascular prosthetic devices, implants and grafts, sequela: Secondary | ICD-10-CM

## 2021-03-19 ENCOUNTER — Other Ambulatory Visit: Payer: Self-pay | Admitting: Internal Medicine

## 2021-03-19 DIAGNOSIS — T82868S Thrombosis of vascular prosthetic devices, implants and grafts, sequela: Secondary | ICD-10-CM

## 2021-03-19 NOTE — Telephone Encounter (Signed)
  warfarin (COUMADIN) 4 MG tablet,  REFILL REQUEST @  [place] ([place]), [place] - [address] Phone:  [phone]  Fax:  [phone]

## 2021-03-25 ENCOUNTER — Other Ambulatory Visit: Payer: Self-pay

## 2021-03-25 DIAGNOSIS — M545 Low back pain, unspecified: Secondary | ICD-10-CM

## 2021-03-25 DIAGNOSIS — G8929 Other chronic pain: Secondary | ICD-10-CM

## 2021-03-25 NOTE — Telephone Encounter (Signed)
  oxyCODONE-acetaminophen (PERCOCET) 7.5-325 MG tablet, refill request @  [id number] - [place] ([place]), [place] - [address] Phone:  [phone]  Fax:  [phone]      

## 2021-03-27 MED ORDER — OXYCODONE-ACETAMINOPHEN 7.5-325 MG PO TABS: 1.0000 | ORAL_TABLET | Freq: Three times a day (TID) | ORAL | 0 refills | Status: DC | PRN

## 2021-04-02 ENCOUNTER — Other Ambulatory Visit: Payer: Self-pay | Admitting: *Deleted

## 2021-04-02 DIAGNOSIS — I779 Disorder of arteries and arterioles, unspecified: Secondary | ICD-10-CM

## 2021-04-02 DIAGNOSIS — I10 Essential (primary) hypertension: Secondary | ICD-10-CM

## 2021-04-02 NOTE — Telephone Encounter (Signed)
100 Day Prescription Request  Your patient below is a member of an [employer].  They are identified as a patient taking a medication for which the Center for Medicare and Medicaid Services (CMS) tracks the number of individuals that maintain an adherence rate of 80% or higher for Star Rating during the calendar year.    Maintaining good adherence is so important to achieve the desired clinical goal.  By authorizing 100 day refills for the member, they will not need to refill their prescription as often which makes it easier for them to remain compliant.   What to do next: Please e-prescribe 100 day refill to the individual's pharmacy for this medication: Lisinopril 20 mg

## 2021-04-03 MED ORDER — LISINOPRIL 20 MG PO TABS: 20.0000 mg | ORAL_TABLET | Freq: Every day | ORAL | 1 refills | Status: DC

## 2021-04-16 ENCOUNTER — Other Ambulatory Visit: Payer: Self-pay | Admitting: Internal Medicine

## 2021-04-16 DIAGNOSIS — T82868S Thrombosis of vascular prosthetic devices, implants and grafts, sequela: Secondary | ICD-10-CM

## 2021-04-16 MED ORDER — WARFARIN SODIUM 4 MG PO TABS: ORAL_TABLET | ORAL | 0 refills | Status: DC

## 2021-04-16 NOTE — Telephone Encounter (Signed)
Refill Request-  Pt states she has already call her pharmacy.  Pharmacy has instructed the pt to call her PCP for the following medication refill:    warfarin (COUMADIN) 4 MG tablet   [place] ([place]), [place] - [address] (Ph: [phone])

## 2021-04-22 ENCOUNTER — Other Ambulatory Visit: Payer: Self-pay | Admitting: Internal Medicine

## 2021-04-22 DIAGNOSIS — G8929 Other chronic pain: Secondary | ICD-10-CM

## 2021-04-22 MED ORDER — OXYCODONE-ACETAMINOPHEN 7.5-325 MG PO TABS: 1.0000 | ORAL_TABLET | Freq: Three times a day (TID) | ORAL | 0 refills | Status: DC | PRN

## 2021-04-22 NOTE — Telephone Encounter (Signed)
Refill Request   oxyCODONE-acetaminophen (PERCOCET) 7.5-325 MG tablet   [place] ([place]),  - [address] (Ph: [phone])

## 2021-04-23 ENCOUNTER — Other Ambulatory Visit: Payer: Self-pay

## 2021-04-23 ENCOUNTER — Ambulatory Visit
Admission: RE | Admit: 2021-04-23 | Discharge: 2021-04-23 | Disposition: A | Discharge status: Home / Self Care | Payer: Medicare HMO | Source: Ambulatory Visit | Attending: Internal Medicine | Admitting: Internal Medicine

## 2021-04-23 DIAGNOSIS — Z1231 Encounter for screening mammogram for malignant neoplasm of breast: Secondary | ICD-10-CM

## 2021-04-27 ENCOUNTER — Ambulatory Visit (INDEPENDENT_AMBULATORY_CARE_PROVIDER_SITE_OTHER): Payer: Medicare HMO | Admitting: Pharmacist

## 2021-04-27 DIAGNOSIS — Z7901 Long term (current) use of anticoagulants: Secondary | ICD-10-CM

## 2021-04-27 DIAGNOSIS — I779 Disorder of arteries and arterioles, unspecified: Secondary | ICD-10-CM | POA: Diagnosis not present

## 2021-04-27 DIAGNOSIS — T82868S Thrombosis of vascular prosthetic devices, implants and grafts, sequela: Secondary | ICD-10-CM

## 2021-04-27 LAB — POCT INR: INR: 2 (ref 2.0–3.0)

## 2021-04-27 MED ORDER — WARFARIN SODIUM 4 MG PO TABS: ORAL_TABLET | ORAL | 2 refills | Status: DC
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
## 2021-04-27 NOTE — Addendum Note (Signed)
Addended by: Jorene Guest B on: 04/27/2021 03:17 PM   Modules accepted: Orders

## 2021-04-27 NOTE — Patient Instructions (Signed)
Patient instructed to take medications as defined in the Anti-coagulation Track section of this encounter.  Patient instructed to take today's dose.  Patient instructed to take one of your 4mg  blue warfarin tablets all days of the week--EXCEPT on Mondays and Thursdays, take one-and-one-half (1&1/2) tablets on Mondays and Thursdays. Patient verbalized understanding of these instructions.

## 2021-04-28 NOTE — Progress Notes (Signed)
Assessment and plan reviewed and appropriate.

## 2021-05-19 ENCOUNTER — Encounter: Payer: Self-pay | Admitting: *Deleted

## 2021-05-21 ENCOUNTER — Other Ambulatory Visit: Payer: Self-pay

## 2021-05-21 DIAGNOSIS — G8929 Other chronic pain: Secondary | ICD-10-CM

## 2021-05-21 MED ORDER — OXYCODONE-ACETAMINOPHEN 7.5-325 MG PO TABS: 1.0000 | ORAL_TABLET | Freq: Three times a day (TID) | ORAL | 0 refills | Status: DC | PRN

## 2021-05-21 NOTE — Telephone Encounter (Signed)
oxyCODONE-acetaminophen (PERCOCET) 7.5-325 MG tablet, refil request @  [facility] ([place]), [place] - [address] Phone:  [phone]  Fax:  [phone]

## 2021-05-25 ENCOUNTER — Ambulatory Visit (INDEPENDENT_AMBULATORY_CARE_PROVIDER_SITE_OTHER): Payer: Medicare HMO | Admitting: Pharmacist

## 2021-05-25 DIAGNOSIS — T82868S Thrombosis of vascular prosthetic devices, implants and grafts, sequela: Secondary | ICD-10-CM

## 2021-05-25 DIAGNOSIS — Z7901 Long term (current) use of anticoagulants: Secondary | ICD-10-CM

## 2021-05-25 DIAGNOSIS — I779 Disorder of arteries and arterioles, unspecified: Secondary | ICD-10-CM

## 2021-05-25 LAB — POCT INR: INR: 2.9 (ref 2.0–3.0)

## 2021-05-25 NOTE — Patient Instructions (Signed)
Patient instructed to take medications as defined in the Anti-coagulation Track section of this encounter.  Patient instructed to OMIT today's dose.  Patient instructed to take one (1) of your 4mg  blue colored warfarin tablets, once-daily at 6PM.Commence on Tuesday May 26, 2021.   Patient verbalized understanding of these instructions.
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
## 2021-06-01 NOTE — Progress Notes (Signed)
INTERNAL MEDICINE TEACHING ATTENDING ADDENDUM   I agree with pharmacy recommendations as outlined in their note.   Gaby Harney, MD  

## 2021-06-04 ENCOUNTER — Other Ambulatory Visit: Payer: Medicare HMO

## 2021-06-08 ENCOUNTER — Ambulatory Visit (INDEPENDENT_AMBULATORY_CARE_PROVIDER_SITE_OTHER): Payer: Medicare HMO | Admitting: Pharmacist

## 2021-06-08 DIAGNOSIS — T82868S Thrombosis of vascular prosthetic devices, implants and grafts, sequela: Secondary | ICD-10-CM | POA: Diagnosis not present

## 2021-06-08 DIAGNOSIS — Z7901 Long term (current) use of anticoagulants: Secondary | ICD-10-CM

## 2021-06-08 DIAGNOSIS — I779 Disorder of arteries and arterioles, unspecified: Secondary | ICD-10-CM | POA: Diagnosis not present

## 2021-06-08 LAB — POCT INR: INR: 2 (ref 2.0–3.0)

## 2021-06-08 NOTE — Patient Instructions (Signed)
Patient instructed to take medications as defined in the Anti-coagulation Track section of this encounter.  Patient instructed to take today's dose.  Patient instructed to take  one (1) of your '4mg'$  blue colored warfarin tablets, once-daily at 6PM--EXCEPT on MONDAYS and THURSDAYS, take 1&1/2 tablets on Mondays and Thursdays.  Patient verbalized understanding of these instructions.
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
## 2021-06-10 NOTE — Progress Notes (Signed)
INTERNAL MEDICINE TEACHING ATTENDING ADDENDUM - Aldine Contes M.D  Duration- indefinite, Indication- vascular graft thrombosis, INR- therapeutic. Agree with pharmacy recommendations as outlined in their note.

## 2021-06-12 ENCOUNTER — Ambulatory Visit
Admission: RE | Admit: 2021-06-12 | Discharge: 2021-06-12 | Disposition: A | Discharge status: Home / Self Care | Payer: Medicare HMO | Source: Ambulatory Visit | Attending: Internal Medicine | Admitting: Internal Medicine

## 2021-06-12 ENCOUNTER — Other Ambulatory Visit: Payer: Self-pay

## 2021-06-12 DIAGNOSIS — M810 Age-related osteoporosis without current pathological fracture: Secondary | ICD-10-CM

## 2021-06-25 NOTE — Progress Notes (Signed)
Things That May Be Affecting Your Health:  Alcohol  Hearing loss X Pain    Depression  Home Safety  Sexual Health   Diabetes  Lack of physical activity  Stress   Difficulty with daily activities  Loneliness  Tiredness   Drug use  Medicines  Tobacco use   Falls  Motor Vehicle Safety  Weight   Food choices  Oral Health X Other  ([place])    YOUR PERSONALIZED HEALTH PLAN : 1. Schedule your next subsequent Medicare Wellness visit in one year 2. Attend all of your regular appointments to address your medical issues 3. Complete the preventative screenings and services   Annual Wellness Visit   Medicare Covered Preventative Screenings and [place] Men and Women Who How Often Need? Date of Last Service Action  Abdominal Aortic Aneurysm Adults with AAA risk factors Once      Alcohol Misuse and Counseling All Adults Screening once a year if no alcohol misuse. Counseling up to 4 face to face sessions. N    Bone Density Measurement  Adults at risk for osteoporosis Once every 2 yrs N 2022   Lipid Panel Z13.6 All adults without CV disease Once every 5 yrs Y 2014    Colorectal Cancer  Stool sample or Colonoscopy All adults 98 and older  Once every year Every 10 years N 2014, due 2024    Depression All Adults Once a year  Today   Diabetes Screening Blood glucose, post glucose load, or GTT Z13.1 All adults at risk Pre-diabetics Once per year Twice per year Y 2013, needs A1C    Diabetes  Self-Management Training All adults Diabetics 10 hrs first year; 2 hours subsequent years. Requires Copay     Glaucoma Diabetics Family history of glaucoma African Americans 49 yrs + Hispanic Americans 71 yrs + Annually - requires coppay Y If she is interested    Hepatitis C Z72.89 or F19.20 High Risk for HCV Born between 1945 and 1965 Annually Once N 2020    HIV Z11.4 All adults based on risk Annually btw ages 57 & 27 regardless of risk Annually > 24 yrs if at increased risk N      Lung Cancer Screening Asymptomatic adults aged 13-77 with 30 pack yr history and current smoker OR quit within the last 15 yrs Annually Must have counseling and shared decision making documentation before first screen N Does not meet criteria    Medical Nutrition Therapy Adults with  Diabetes Renal disease Kidney transplant within past 3 yrs 3 hours first year; 2 hours subsequent years     Obesity and Counseling All adults Screening once a year Counseling if BMI 30 or higher  Today   Tobacco Use Counseling Adults who use tobacco  Up to 8 visits in one year     Vaccines Z23 Hepatitis B Influenza  Pneumonia  Adults  Once Once every flu season Two different vaccines separated by one year     Next Annual Wellness Visit People with Medicare Every year  Today     Services & Screenings Women Who How Often Need  Date of Last Service Action  Mammogram  Z12.31 Women over 46 One baseline ages 52-39. Annually ager 9 yrs+ N 2022    Pap tests All women Annually if high risk. Every 2 yrs for normal risk women N Unknown  Likely graduated, assess when her last pap was.   Screening for cervical cancer with  Pap (Z01.419 nl or Z01.411abnl) &  HPV Z11.51 Women aged 2 to 1 Once every 5 yrs     Screening pelvic and breast exams All women Annually if high risk. Every 2 yrs for normal risk women     Sexually Transmitted Diseases Chlamydia Gonorrhea Syphilis All at risk adults Annually for non pregnant females at increased risk         [place] Men Who How Ofter Need  Date of Last Service Action  Prostate Cancer - DRE & PSA Men over 50 Annually.  DRE might require a copay.        Sexually Transmitted Diseases Syphilis All at risk adults Annually for men at increased risk      Health Maintenance List Health Maintenance  Topic Date Due   Zoster Vaccines- Shingrix (1 of 2) Never done   COVID-19 Vaccine (3 - Booster for Pfizer series) 07/17/2020   INFLUENZA VACCINE   06/15/2021   TETANUS/TDAP  05/08/2022   COLONOSCOPY (Pts 45-62yr Insurance coverage will need to be confirmed)  01/27/2023   MAMMOGRAM  04/24/2023   DEXA SCAN  Completed   Hepatitis C Screening  Completed   PNA vac Low Risk Adult  Completed   HPV VACCINES  Aged Out

## 2021-06-29 ENCOUNTER — Ambulatory Visit (INDEPENDENT_AMBULATORY_CARE_PROVIDER_SITE_OTHER): Payer: Medicare HMO | Admitting: Pharmacist

## 2021-06-29 DIAGNOSIS — T82868S Thrombosis of vascular prosthetic devices, implants and grafts, sequela: Secondary | ICD-10-CM

## 2021-06-29 DIAGNOSIS — Z7901 Long term (current) use of anticoagulants: Secondary | ICD-10-CM

## 2021-06-29 DIAGNOSIS — I779 Disorder of arteries and arterioles, unspecified: Secondary | ICD-10-CM

## 2021-06-29 DIAGNOSIS — Z86718 Personal history of other venous thrombosis and embolism: Secondary | ICD-10-CM | POA: Diagnosis not present

## 2021-06-29 DIAGNOSIS — I70219 Atherosclerosis of native arteries of extremities with intermittent claudication, unspecified extremity: Secondary | ICD-10-CM

## 2021-06-29 DIAGNOSIS — Z5181 Encounter for therapeutic drug level monitoring: Secondary | ICD-10-CM

## 2021-06-29 LAB — POCT INR: INR: 2.5 (ref 2.0–3.0)

## 2021-06-29 NOTE — Patient Instructions (Signed)
Patient instructed to take medications as defined in the Anti-coagulation Track section of this encounter.  Patient instructed to take today's dose.  Patient instructed to take one (1) of your 4mg  blue colored warfarin tablets, once-daily at 6PM--EXCEPT on MONDAYS and THURSDAYS, take 1&1/2 tablets on Mondays and Thursdays.  Patient verbalized understanding of these instructions.
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
## 2021-07-01 ENCOUNTER — Other Ambulatory Visit: Payer: Self-pay | Admitting: Internal Medicine

## 2021-07-01 DIAGNOSIS — M545 Low back pain, unspecified: Secondary | ICD-10-CM

## 2021-07-01 NOTE — Telephone Encounter (Signed)
Refill Request ° ° °oxyCODONE-acetaminophen (PERCOCET) 7.5-325 MG tablet ° ° ° °

## 2021-07-01 NOTE — Progress Notes (Signed)
INTERNAL MEDICINE TEACHING ATTENDING ADDENDUM - Aldine Contes M.D  Duration- indefinite, Indication- recurrent graft thrombosis, INR- therapeutic. Agree with pharmacy recommendations as outlined in their note.

## 2021-07-01 NOTE — Telephone Encounter (Signed)
  Refill Request     oxyCODONE-acetaminophen (PERCOCET) 7.5-325 MG tablet  [place] ([place]), [place] - [address] (Ph: [phone])  Order Details

## 2021-07-01 NOTE — Telephone Encounter (Signed)
Last rx written - 05/21/21. Last OV 12/29/20. Next OV - has not been scheduled. UDS 02/17/18.

## 2021-07-03 MED ORDER — OXYCODONE-ACETAMINOPHEN 7.5-325 MG PO TABS: 1.0000 | ORAL_TABLET | Freq: Three times a day (TID) | ORAL | 0 refills | Status: DC | PRN

## 2021-07-27 ENCOUNTER — Ambulatory Visit (INDEPENDENT_AMBULATORY_CARE_PROVIDER_SITE_OTHER): Payer: Medicare HMO | Admitting: Pharmacist

## 2021-07-27 DIAGNOSIS — T82868S Thrombosis of vascular prosthetic devices, implants and grafts, sequela: Secondary | ICD-10-CM | POA: Diagnosis not present

## 2021-07-27 DIAGNOSIS — Z23 Encounter for immunization: Secondary | ICD-10-CM | POA: Diagnosis not present

## 2021-07-27 DIAGNOSIS — Z7901 Long term (current) use of anticoagulants: Secondary | ICD-10-CM

## 2021-07-27 DIAGNOSIS — I779 Disorder of arteries and arterioles, unspecified: Secondary | ICD-10-CM

## 2021-07-27 LAB — POCT INR: INR: 2.6 (ref 2.0–3.0)

## 2021-07-27 NOTE — Patient Instructions (Signed)
Patient instructed to take medications as defined in the Anti-coagulation Track section of this encounter.  Patient instructed to take today's dose.  Patient instructed to take one (1) of your 4mg  blue colored warfarin tablets, once-daily at 6PM--EXCEPT on MONDAYS and THURSDAYS, take 1&1/2 tablets on Mondays and Thursdays.  Patient verbalized understanding of these instructions.
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
## 2021-08-03 ENCOUNTER — Other Ambulatory Visit: Payer: Self-pay

## 2021-08-03 DIAGNOSIS — G8929 Other chronic pain: Secondary | ICD-10-CM

## 2021-08-03 MED ORDER — OXYCODONE-ACETAMINOPHEN 7.5-325 MG PO TABS: 1.0000 | ORAL_TABLET | Freq: Three times a day (TID) | ORAL | 0 refills | Status: DC | PRN

## 2021-08-03 NOTE — Progress Notes (Signed)
I agree with Dr. Gladstone Pih assessment and plan as documented.

## 2021-08-03 NOTE — Telephone Encounter (Signed)
oxyCODONE-acetaminophen (PERCOCET) 7.5-325 MG tablet, REFILL REQUEST @ [place] ([place]), [place] - [address]

## 2021-08-03 NOTE — Telephone Encounter (Signed)
Oxycodone Last rx written 07/03/21. Last OV 12/26/20. Next OV 09/18/21 UDS 02/17/18.

## 2021-08-12 ENCOUNTER — Other Ambulatory Visit: Payer: Self-pay | Admitting: Pharmacist

## 2021-08-12 DIAGNOSIS — T82868S Thrombosis of vascular prosthetic devices, implants and grafts, sequela: Secondary | ICD-10-CM

## 2021-08-13 NOTE — Telephone Encounter (Signed)
warfarin (COUMADIN) 4 MG tablet,  REFILL REQUEST @ [facility] ([place]), [place] - [address]

## 2021-08-13 NOTE — Telephone Encounter (Signed)
Next appt scheduled  09/18/21 with PCP. 

## 2021-08-14 NOTE — Telephone Encounter (Signed)
Call from pt again, stated she's completely out of Warfarin. Thanks

## 2021-08-14 NOTE — Telephone Encounter (Signed)
Called pt inform her of refill but no answer; left message.

## 2021-08-24 ENCOUNTER — Ambulatory Visit (INDEPENDENT_AMBULATORY_CARE_PROVIDER_SITE_OTHER): Payer: Medicare HMO | Admitting: Pharmacist

## 2021-08-24 DIAGNOSIS — T82868S Thrombosis of vascular prosthetic devices, implants and grafts, sequela: Secondary | ICD-10-CM | POA: Diagnosis not present

## 2021-08-24 DIAGNOSIS — I779 Disorder of arteries and arterioles, unspecified: Secondary | ICD-10-CM

## 2021-08-24 DIAGNOSIS — Z7901 Long term (current) use of anticoagulants: Secondary | ICD-10-CM

## 2021-08-24 LAB — POCT INR: INR: 2.5 (ref 2.0–3.0)
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
## 2021-08-24 NOTE — Patient Instructions (Signed)
Patient instructed to take medications as defined in the Anti-coagulation Track section of this encounter.  Patient instructed to take today's dose.  Patient instructed to take one (1) of your 4mg  blue colored warfarin tablets, once-daily at 6PM--EXCEPT on MONDAYS and THURSDAYS, take 1&1/2 tablets on Mondays and Thursdays.  Patient verbalized understanding of these instructions.

## 2021-08-26 NOTE — Progress Notes (Signed)
INTERNAL MEDICINE TEACHING ATTENDING ADDENDUM - Keeyon Privitera M.D  Duration- indefinite, Indication- fem pop bypass graft occlusion, INR- therapeutic. Agree with pharmacy recommendations as outlined in their note.

## 2021-08-31 ENCOUNTER — Other Ambulatory Visit: Payer: Self-pay

## 2021-08-31 DIAGNOSIS — G8929 Other chronic pain: Secondary | ICD-10-CM

## 2021-08-31 DIAGNOSIS — M545 Low back pain, unspecified: Secondary | ICD-10-CM

## 2021-08-31 NOTE — Telephone Encounter (Signed)
oxyCODONE-acetaminophen (PERCOCET) 7.5-325 MG tablet, refill request.

## 2021-09-02 MED ORDER — OXYCODONE-ACETAMINOPHEN 7.5-325 MG PO TABS: 1.0000 | ORAL_TABLET | Freq: Three times a day (TID) | ORAL | 0 refills | Status: DC | PRN

## 2021-09-10 ENCOUNTER — Other Ambulatory Visit: Payer: Self-pay

## 2021-09-10 DIAGNOSIS — T82868S Thrombosis of vascular prosthetic devices, implants and grafts, sequela: Secondary | ICD-10-CM

## 2021-09-10 MED ORDER — WARFARIN SODIUM 4 MG PO TABS: ORAL_TABLET | ORAL | 0 refills | Status: DC

## 2021-09-10 NOTE — Telephone Encounter (Signed)
warfarin (COUMADIN) 4 MG tablet, REFILL REQUEST @ [place] ([place]),  - [address]

## 2021-09-10 NOTE — Telephone Encounter (Signed)
Next coumadin appt 09/21/21.

## 2021-09-11 ENCOUNTER — Encounter (HOSPITAL_COMMUNITY): Payer: Self-pay | Admitting: *Deleted

## 2021-09-11 ENCOUNTER — Emergency Department (HOSPITAL_COMMUNITY): Payer: Medicare HMO

## 2021-09-11 ENCOUNTER — Inpatient Hospital Stay (HOSPITAL_COMMUNITY)
Admission: EM | Admit: 2021-09-11 | Discharge: 2021-09-14 | DRG: 291 | Disposition: A | Discharge status: Home / Self Care | Payer: Medicare HMO | Attending: Internal Medicine | Admitting: Internal Medicine

## 2021-09-11 ENCOUNTER — Other Ambulatory Visit: Payer: Self-pay

## 2021-09-11 DIAGNOSIS — Z79899 Other long term (current) drug therapy: Secondary | ICD-10-CM

## 2021-09-11 DIAGNOSIS — E876 Hypokalemia: Secondary | ICD-10-CM | POA: Diagnosis present

## 2021-09-11 DIAGNOSIS — Z7901 Long term (current) use of anticoagulants: Secondary | ICD-10-CM

## 2021-09-11 DIAGNOSIS — Z20822 Contact with and (suspected) exposure to covid-19: Secondary | ICD-10-CM | POA: Diagnosis present

## 2021-09-11 DIAGNOSIS — I959 Hypotension, unspecified: Secondary | ICD-10-CM | POA: Diagnosis not present

## 2021-09-11 DIAGNOSIS — I739 Peripheral vascular disease, unspecified: Secondary | ICD-10-CM | POA: Diagnosis present

## 2021-09-11 DIAGNOSIS — I5022 Chronic systolic (congestive) heart failure: Secondary | ICD-10-CM

## 2021-09-11 DIAGNOSIS — I110 Hypertensive heart disease with heart failure: Principal | ICD-10-CM | POA: Diagnosis present

## 2021-09-11 DIAGNOSIS — I509 Heart failure, unspecified: Secondary | ICD-10-CM

## 2021-09-11 DIAGNOSIS — T502X6A Underdosing of carbonic-anhydrase inhibitors, benzothiadiazides and other diuretics, initial encounter: Secondary | ICD-10-CM | POA: Diagnosis present

## 2021-09-11 DIAGNOSIS — Z87891 Personal history of nicotine dependence: Secondary | ICD-10-CM

## 2021-09-11 DIAGNOSIS — R739 Hyperglycemia, unspecified: Secondary | ICD-10-CM | POA: Diagnosis present

## 2021-09-11 DIAGNOSIS — R0602 Shortness of breath: Secondary | ICD-10-CM | POA: Diagnosis not present

## 2021-09-11 DIAGNOSIS — Z8249 Family history of ischemic heart disease and other diseases of the circulatory system: Secondary | ICD-10-CM

## 2021-09-11 DIAGNOSIS — Z800 Family history of malignant neoplasm of digestive organs: Secondary | ICD-10-CM

## 2021-09-11 DIAGNOSIS — I5041 Acute combined systolic (congestive) and diastolic (congestive) heart failure: Secondary | ICD-10-CM | POA: Diagnosis present

## 2021-09-11 DIAGNOSIS — J449 Chronic obstructive pulmonary disease, unspecified: Secondary | ICD-10-CM | POA: Diagnosis present

## 2021-09-11 DIAGNOSIS — M810 Age-related osteoporosis without current pathological fracture: Secondary | ICD-10-CM | POA: Diagnosis present

## 2021-09-11 DIAGNOSIS — R791 Abnormal coagulation profile: Secondary | ICD-10-CM | POA: Diagnosis present

## 2021-09-11 DIAGNOSIS — Z803 Family history of malignant neoplasm of breast: Secondary | ICD-10-CM

## 2021-09-11 DIAGNOSIS — G894 Chronic pain syndrome: Secondary | ICD-10-CM | POA: Diagnosis present

## 2021-09-11 DIAGNOSIS — Z91128 Patient's intentional underdosing of medication regimen for other reason: Secondary | ICD-10-CM

## 2021-09-11 LAB — COMPREHENSIVE METABOLIC PANEL
ALT: 15 U/L (ref 0–44)
AST: 34 U/L (ref 15–41)
Albumin: 3.4 g/dL — ABNORMAL LOW (ref 3.5–5.0)
Alkaline Phosphatase: 65 U/L (ref 38–126)
Anion gap: 10 (ref 5–15)
BUN: 7 mg/dL — ABNORMAL LOW (ref 8–23)
CO2: 25 mmol/L (ref 22–32)
Calcium: 8.8 mg/dL — ABNORMAL LOW (ref 8.9–10.3)
Chloride: 105 mmol/L (ref 98–111)
Creatinine, Ser: 0.95 mg/dL (ref 0.44–1.00)
GFR, Estimated: >60 mL/min (ref >60)
Glucose, Bld: 146 mg/dL — ABNORMAL HIGH (ref 70–99)
Potassium: 3.1 mmol/L — ABNORMAL LOW (ref 3.5–5.1)
Sodium: 140 mmol/L (ref 135–145)
Total Bilirubin: 0.4 mg/dL (ref 0.3–1.2)
Total Protein: 6.7 g/dL (ref 6.5–8.1)

## 2021-09-11 LAB — CBC WITH DIFFERENTIAL/PLATELET
Abs Immature Granulocytes: 0.01 10*3/uL (ref 0.00–0.07)
Basophils Absolute: 0 10*3/uL (ref 0.0–0.1)
Basophils Relative: 1 %
Eosinophils Absolute: 0.1 10*3/uL (ref 0.0–0.5)
Eosinophils Relative: 1 %
HCT: 39.5 % (ref 36.0–46.0)
Hemoglobin: 12.1 g/dL (ref 12.0–15.0)
Immature Granulocytes: 0 %
Lymphocytes Relative: 19 %
Lymphs Abs: 0.9 10*3/uL (ref 0.7–4.0)
MCH: 28.7 pg (ref 26.0–34.0)
MCHC: 30.6 g/dL (ref 30.0–36.0)
MCV: 93.8 fL (ref 80.0–100.0)
Monocytes Absolute: 0.3 10*3/uL (ref 0.1–1.0)
Monocytes Relative: 7 %
Neutro Abs: 3.5 10*3/uL (ref 1.7–7.7)
Neutrophils Relative %: 72 %
Platelets: 216 10*3/uL (ref 150–400)
RBC: 4.21 MIL/uL (ref 3.87–5.11)
RDW: 14.6 % (ref 11.5–15.5)
WBC: 4.8 10*3/uL (ref 4.0–10.5)
nRBC: 0 % (ref 0.0–0.2)

## 2021-09-11 LAB — TSH: TSH: 0.3 u[IU]/mL — ABNORMAL LOW (ref 0.350–4.500)

## 2021-09-11 LAB — TROPONIN I (HIGH SENSITIVITY): Troponin I (High Sensitivity): 5 ng/L (ref <18)

## 2021-09-11 LAB — HEMOGLOBIN A1C
Hgb A1c MFr Bld: 5.8 % — ABNORMAL HIGH (ref 4.8–5.6)
Mean Plasma Glucose: 119.76 mg/dL

## 2021-09-11 LAB — LIPID PANEL
Cholesterol: 200 mg/dL (ref 0–200)
HDL: 77 mg/dL (ref >40)
LDL Cholesterol: 107 mg/dL — ABNORMAL HIGH (ref 0–99)
Total CHOL/HDL Ratio: 2.6 RATIO
Triglycerides: 80 mg/dL (ref <150)
VLDL: 16 mg/dL (ref 0–40)

## 2021-09-11 LAB — RESP PANEL BY RT-PCR (FLU A&B, COVID) ARPGX2
Influenza A by PCR: NEGATIVE
Influenza B by PCR: NEGATIVE
SARS Coronavirus 2 by RT PCR: NEGATIVE

## 2021-09-11 LAB — PROTIME-INR
INR: 2.8 — ABNORMAL HIGH (ref 0.8–1.2)
Prothrombin Time: 29.3 seconds — ABNORMAL HIGH (ref 11.4–15.2)

## 2021-09-11 LAB — MAGNESIUM: Magnesium: 2 mg/dL (ref 1.7–2.4)

## 2021-09-11 LAB — BRAIN NATRIURETIC PEPTIDE: B Natriuretic Peptide: 968 pg/mL — ABNORMAL HIGH (ref 0.0–100.0)

## 2021-09-11 MED ORDER — ALBUTEROL SULFATE (2.5 MG/3ML) 0.083% IN NEBU: 3.0000 mL | INHALATION_SOLUTION | Freq: Four times a day (QID) | RESPIRATORY_TRACT | Status: DC | PRN

## 2021-09-11 MED ORDER — LISINOPRIL 20 MG PO TABS
20.0000 mg | ORAL_TABLET | Freq: Once | ORAL | Status: AC
  Administered 2021-09-11: 20 mg via ORAL
  Filled 2021-09-11: qty 1

## 2021-09-11 MED ORDER — POTASSIUM CHLORIDE CRYS ER 20 MEQ PO TBCR
40.0000 meq | EXTENDED_RELEASE_TABLET | Freq: Once | ORAL | Status: AC
  Administered 2021-09-11: 40 meq via ORAL
  Filled 2021-09-11: qty 2

## 2021-09-11 MED ORDER — MIRTAZAPINE 15 MG PO TABS
30.0000 mg | ORAL_TABLET | Freq: Every day | ORAL | Status: DC
  Administered 2021-09-11 – 2021-09-13 (×3): 30 mg via ORAL
  Filled 2021-09-11: qty 2
  Filled 2021-09-11: qty 1
  Filled 2021-09-11 (×2): qty 2

## 2021-09-11 MED ORDER — OXYCODONE-ACETAMINOPHEN 7.5-325 MG PO TABS: 1.0000 | ORAL_TABLET | Freq: Three times a day (TID) | ORAL | Status: DC | PRN

## 2021-09-11 MED ORDER — FUROSEMIDE 10 MG/ML IJ SOLN
40.0000 mg | Freq: Once | INTRAMUSCULAR | Status: AC
  Administered 2021-09-11: 40 mg via INTRAVENOUS
  Filled 2021-09-11: qty 4

## 2021-09-11 MED ORDER — TRIAMTERENE-HCTZ 37.5-25 MG PO TABS
1.0000 | ORAL_TABLET | Freq: Once | ORAL | Status: AC
  Administered 2021-09-11: 1 via ORAL
  Filled 2021-09-11: qty 1

## 2021-09-11 MED ORDER — CLOPIDOGREL BISULFATE 75 MG PO TABS
75.0000 mg | ORAL_TABLET | Freq: Every day | ORAL | Status: DC
  Administered 2021-09-11 – 2021-09-14 (×4): 75 mg via ORAL
  Filled 2021-09-11 (×4): qty 1

## 2021-09-11 MED ORDER — ATORVASTATIN CALCIUM 10 MG PO TABS
20.0000 mg | ORAL_TABLET | Freq: Every day | ORAL | Status: DC
  Administered 2021-09-11: 20 mg via ORAL
  Filled 2021-09-11: qty 2

## 2021-09-11 MED ORDER — FUROSEMIDE 10 MG/ML IJ SOLN
40.0000 mg | Freq: Two times a day (BID) | INTRAMUSCULAR | Status: DC
  Administered 2021-09-11 – 2021-09-13 (×4): 40 mg via INTRAVENOUS
  Filled 2021-09-11 (×4): qty 4

## 2021-09-11 MED ORDER — OXYCODONE HCL 5 MG PO TABS
2.5000 mg | ORAL_TABLET | Freq: Three times a day (TID) | ORAL | Status: DC | PRN
Start: 2021-09-11 — End: 2021-09-14
  Administered 2021-09-11: 2.5 mg via ORAL
  Filled 2021-09-11: qty 1

## 2021-09-11 MED ORDER — WARFARIN - PHARMACIST DOSING INPATIENT: Freq: Every day | Status: DC

## 2021-09-11 MED ORDER — POLYETHYLENE GLYCOL 3350 17 G PO PACK: 17.0000 g | PACK | Freq: Every day | ORAL | Status: DC | PRN

## 2021-09-11 MED ORDER — ACETAMINOPHEN 650 MG RE SUPP: 650.0000 mg | Freq: Four times a day (QID) | RECTAL | Status: DC | PRN

## 2021-09-11 MED ORDER — LISINOPRIL 20 MG PO TABS
20.0000 mg | ORAL_TABLET | Freq: Every day | ORAL | Status: DC
  Administered 2021-09-12: 20 mg via ORAL
  Filled 2021-09-11: qty 1

## 2021-09-11 MED ORDER — OXYCODONE-ACETAMINOPHEN 5-325 MG PO TABS
1.0000 | ORAL_TABLET | Freq: Three times a day (TID) | ORAL | Status: DC | PRN
  Administered 2021-09-11: 1 via ORAL
  Filled 2021-09-11: qty 1

## 2021-09-11 MED ORDER — WARFARIN SODIUM 2 MG PO TABS
4.0000 mg | ORAL_TABLET | Freq: Every day | ORAL | Status: DC
  Administered 2021-09-11: 4 mg via ORAL
  Filled 2021-09-11: qty 1

## 2021-09-11 MED ORDER — ADULT MULTIVITAMIN W/MINERALS CH
1.0000 | ORAL_TABLET | Freq: Every day | ORAL | Status: DC
  Administered 2021-09-11 – 2021-09-14 (×4): 1 via ORAL
  Filled 2021-09-11 (×4): qty 1

## 2021-09-11 MED ORDER — SODIUM CHLORIDE 0.9% FLUSH
3.0000 mL | Freq: Two times a day (BID) | INTRAVENOUS | Status: DC
  Administered 2021-09-11 – 2021-09-14 (×6): 3 mL via INTRAVENOUS

## 2021-09-11 MED ORDER — ACETAMINOPHEN 325 MG PO TABS: 650.0000 mg | ORAL_TABLET | Freq: Four times a day (QID) | ORAL | Status: DC | PRN

## 2021-09-11 NOTE — ED Notes (Signed)
Family at [facility]. Not able to use purewick. BSC at [facility]. Up to [facility], no urine output at this time.

## 2021-09-11 NOTE — ED Notes (Signed)
Took pt walking up and down the hallway, pt walked back to bathroom when returned to room pt sounded a little winded O2 98 room air.
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
## 2021-09-11 NOTE — ED Notes (Signed)
Pt walked to bathroom and back to room, pt was short of breath when she returned. O2 went down to 90.

## 2021-09-11 NOTE — ED Notes (Signed)
Back from xray. Family at [facility]. No changes. Alert, NAD, calm, interactive. 96% RA.
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
## 2021-09-11 NOTE — ED Notes (Signed)
EDP at BS 
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
## 2021-09-11 NOTE — ED Triage Notes (Signed)
BIB EMS

## 2021-09-12 ENCOUNTER — Observation Stay (HOSPITAL_COMMUNITY): Payer: Medicare HMO

## 2021-09-12 ENCOUNTER — Observation Stay (HOSPITAL_BASED_OUTPATIENT_CLINIC_OR_DEPARTMENT_OTHER): Payer: Medicare HMO

## 2021-09-12 DIAGNOSIS — I10 Essential (primary) hypertension: Secondary | ICD-10-CM | POA: Diagnosis not present

## 2021-09-12 DIAGNOSIS — I509 Heart failure, unspecified: Secondary | ICD-10-CM

## 2021-09-12 DIAGNOSIS — E876 Hypokalemia: Secondary | ICD-10-CM | POA: Diagnosis present

## 2021-09-12 DIAGNOSIS — Z87891 Personal history of nicotine dependence: Secondary | ICD-10-CM | POA: Diagnosis not present

## 2021-09-12 DIAGNOSIS — I739 Peripheral vascular disease, unspecified: Secondary | ICD-10-CM | POA: Diagnosis present

## 2021-09-12 DIAGNOSIS — R791 Abnormal coagulation profile: Secondary | ICD-10-CM | POA: Diagnosis present

## 2021-09-12 DIAGNOSIS — Z803 Family history of malignant neoplasm of breast: Secondary | ICD-10-CM | POA: Diagnosis not present

## 2021-09-12 DIAGNOSIS — R0602 Shortness of breath: Secondary | ICD-10-CM | POA: Diagnosis present

## 2021-09-12 DIAGNOSIS — Z800 Family history of malignant neoplasm of digestive organs: Secondary | ICD-10-CM | POA: Diagnosis not present

## 2021-09-12 DIAGNOSIS — R0609 Other forms of dyspnea: Secondary | ICD-10-CM | POA: Diagnosis not present

## 2021-09-12 DIAGNOSIS — I5041 Acute combined systolic (congestive) and diastolic (congestive) heart failure: Secondary | ICD-10-CM

## 2021-09-12 DIAGNOSIS — I779 Disorder of arteries and arterioles, unspecified: Secondary | ICD-10-CM

## 2021-09-12 DIAGNOSIS — Z20822 Contact with and (suspected) exposure to covid-19: Secondary | ICD-10-CM | POA: Diagnosis present

## 2021-09-12 DIAGNOSIS — I110 Hypertensive heart disease with heart failure: Secondary | ICD-10-CM | POA: Diagnosis present

## 2021-09-12 DIAGNOSIS — E7800 Pure hypercholesterolemia, unspecified: Secondary | ICD-10-CM | POA: Diagnosis not present

## 2021-09-12 DIAGNOSIS — Z8249 Family history of ischemic heart disease and other diseases of the circulatory system: Secondary | ICD-10-CM | POA: Diagnosis not present

## 2021-09-12 DIAGNOSIS — R739 Hyperglycemia, unspecified: Secondary | ICD-10-CM | POA: Diagnosis present

## 2021-09-12 DIAGNOSIS — J449 Chronic obstructive pulmonary disease, unspecified: Secondary | ICD-10-CM | POA: Diagnosis present

## 2021-09-12 DIAGNOSIS — I429 Cardiomyopathy, unspecified: Secondary | ICD-10-CM | POA: Diagnosis not present

## 2021-09-12 DIAGNOSIS — M810 Age-related osteoporosis without current pathological fracture: Secondary | ICD-10-CM | POA: Diagnosis present

## 2021-09-12 DIAGNOSIS — G4762 Sleep related leg cramps: Secondary | ICD-10-CM | POA: Diagnosis not present

## 2021-09-12 DIAGNOSIS — G894 Chronic pain syndrome: Secondary | ICD-10-CM | POA: Diagnosis present

## 2021-09-12 DIAGNOSIS — T502X6A Underdosing of carbonic-anhydrase inhibitors, benzothiadiazides and other diuretics, initial encounter: Secondary | ICD-10-CM | POA: Diagnosis present

## 2021-09-12 DIAGNOSIS — Z79899 Other long term (current) drug therapy: Secondary | ICD-10-CM | POA: Diagnosis not present

## 2021-09-12 DIAGNOSIS — Z91128 Patient's intentional underdosing of medication regimen for other reason: Secondary | ICD-10-CM | POA: Diagnosis not present

## 2021-09-12 DIAGNOSIS — I959 Hypotension, unspecified: Secondary | ICD-10-CM | POA: Diagnosis not present

## 2021-09-12 DIAGNOSIS — Z7901 Long term (current) use of anticoagulants: Secondary | ICD-10-CM | POA: Diagnosis not present

## 2021-09-12 LAB — BASIC METABOLIC PANEL
Anion gap: 10 (ref 5–15)
BUN: 6 mg/dL — ABNORMAL LOW (ref 8–23)
CO2: 31 mmol/L (ref 22–32)
Calcium: 10.1 mg/dL (ref 8.9–10.3)
Chloride: 100 mmol/L (ref 98–111)
Creatinine, Ser: 0.95 mg/dL (ref 0.44–1.00)
GFR, Estimated: >60 mL/min (ref >60)
Glucose, Bld: 112 mg/dL — ABNORMAL HIGH (ref 70–99)
Potassium: 3.7 mmol/L (ref 3.5–5.1)
Sodium: 141 mmol/L (ref 135–145)

## 2021-09-12 LAB — CBC
HCT: 42.8 % (ref 36.0–46.0)
HCT: 45.3 % (ref 36.0–46.0)
Hemoglobin: 13.4 g/dL (ref 12.0–15.0)
Hemoglobin: 14.1 g/dL (ref 12.0–15.0)
MCH: 28.5 pg (ref 26.0–34.0)
MCH: 28.4 pg (ref 26.0–34.0)
MCHC: 31.3 g/dL (ref 30.0–36.0)
MCHC: 31.1 g/dL (ref 30.0–36.0)
MCV: 90.9 fL (ref 80.0–100.0)
MCV: 91.3 fL (ref 80.0–100.0)
Platelets: 235 10*3/uL (ref 150–400)
Platelets: 259 10*3/uL (ref 150–400)
RBC: 4.71 MIL/uL (ref 3.87–5.11)
RBC: 4.96 MIL/uL (ref 3.87–5.11)
RDW: 14.4 % (ref 11.5–15.5)
RDW: 14.5 % (ref 11.5–15.5)
WBC: 3.7 10*3/uL — ABNORMAL LOW (ref 4.0–10.5)
WBC: 3.9 10*3/uL — ABNORMAL LOW (ref 4.0–10.5)
nRBC: 0 % (ref 0.0–0.2)
nRBC: 0 % (ref 0.0–0.2)

## 2021-09-12 LAB — ECHOCARDIOGRAM COMPLETE
AR max vel: 1.91 cm2
AV Area VTI: 1.72 cm2
AV Area mean vel: 1.63 cm2
AV Mean grad: 2 mmHg
AV Peak grad: 4.2 mmHg
Ao pk vel: 1.03 m/s
Area-P 1/2: 4.12 cm2
Calc EF: 51.5 %
Height: 64 in
MV VTI: 3.48 cm2
S' Lateral: 3 cm
Single Plane A2C EF: 53.6 %
Single Plane A4C EF: 46.9 %
Weight: 1720 oz

## 2021-09-12 LAB — T4, FREE: Free T4: 1.13 ng/dL — ABNORMAL HIGH (ref 0.61–1.12)

## 2021-09-12 LAB — PROTIME-INR
INR: 7.3 (ref 0.8–1.2)
Prothrombin Time: 62.1 seconds — ABNORMAL HIGH (ref 11.4–15.2)

## 2021-09-12 LAB — MAGNESIUM: Magnesium: 1.9 mg/dL (ref 1.7–2.4)

## 2021-09-12 MED ORDER — SACUBITRIL-VALSARTAN 24-26 MG PO TABS: 1.0000 | ORAL_TABLET | Freq: Two times a day (BID) | ORAL | Status: DC

## 2021-09-12 MED ORDER — ATORVASTATIN CALCIUM 40 MG PO TABS
40.0000 mg | ORAL_TABLET | Freq: Every day | ORAL | Status: DC
  Administered 2021-09-12 – 2021-09-14 (×3): 40 mg via ORAL
  Filled 2021-09-12: qty 4
  Filled 2021-09-12 (×2): qty 1

## 2021-09-12 MED ORDER — MAGNESIUM SULFATE 2 GM/50ML IV SOLN
2.0000 g | Freq: Once | INTRAVENOUS | Status: AC
  Administered 2021-09-12: 2 g via INTRAVENOUS
  Filled 2021-09-12: qty 50

## 2021-09-12 MED ORDER — METOPROLOL SUCCINATE ER 25 MG PO TB24
25.0000 mg | ORAL_TABLET | Freq: Every day | ORAL | Status: DC
  Administered 2021-09-12 – 2021-09-14 (×3): 25 mg via ORAL
  Filled 2021-09-12 (×3): qty 1

## 2021-09-12 MED ORDER — POTASSIUM CHLORIDE 20 MEQ PO PACK
20.0000 meq | PACK | Freq: Two times a day (BID) | ORAL | Status: AC
  Administered 2021-09-12 (×2): 20 meq via ORAL
  Filled 2021-09-12 (×2): qty 1

## 2021-09-12 NOTE — Progress Notes (Signed)
VASCULAR LAB    ABIs have been performed.  See CV proc for preliminary results.   [name], RVT 09/12/2021, 9:40 AM

## 2021-09-12 NOTE — Progress Notes (Signed)
  Date: 09/12/2021  Patient name: Kristin Knight  Medical record number: [id number]  Date of birth: 01-24-1946   I have seen and evaluated Kristin Knight and discussed their care with the Residency Team. Briefly, [name] is a 75 year old woman with PMH of COPD, chronic pain who presented with acute volume overload, pulmonary edema, concerning for new onset heart failure.  She has been out of her triamterine-hctz for about a month.  BP was elevated on arrival and is improved this morning.  She has improved with IV diuresis. TTE is pending.   Vitals:   09/12/21 0421 09/12/21 0814  BP: (!) 137/107 (!) 137/112  Pulse: 69 88  Resp: 18   Temp: 98.4 F (36.9 C)   SpO2: 96% 99%   Gen: Thin, elderly woman, lying in bed, no distress Eyes: Anicteric sclerae HENT: Neck is supple, JVD noted to the chin.  She has 1+ pitting edema to mid shin on the left, 1+ to the ankle on the right.   CV: RR, NR, no murmur Pulm: Crackles in bases, improved from reported yesterday, no wheezing Abd: Soft, +BS MSK: Thin extremities, normal tone. Normal bulk for age Psych: Pleasant, normal mood.   Assessment and Plan: I have seen and evaluated the patient as outlined above. I agree with the formulated Assessment and Plan as detailed in the residents' note, with the following changes:   Acute volume overload, pulmonary edema - DDx is concerning for hypertensive urgency with flash pulmonary edema vs. New onset heart failure.  EKG with PACs and some new TW changes.  She did not note chest pain or any other changes - IV lasix X 1 again today - Continue telemetry - TTE pending - Encourage OOB as able - For cool extremities, would do ABIs - Restart home lisinopril - If CHF found on imaging, would opt for a different blood pressure regimen going forward for GDMT.   Other issues per Dr. Howie Ill note.   Sid Falcon, MD 10/29/20229:05 AM

## 2021-09-12 NOTE — Progress Notes (Signed)
Received call from doctor to hold Warfarin for now.

## 2021-09-12 NOTE — Progress Notes (Signed)
Received critical lab result that pt's INR was 7.3. Doctor paged to be made aware.

## 2021-09-12 NOTE — Progress Notes (Signed)
*  PRELIMINARY RESULTS* Echocardiogram 2D Echocardiogram has been performed.  Kristin Knight 09/12/2021, 9:29 AM
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
## 2021-09-12 NOTE — Progress Notes (Signed)
HD#0 SUBJECTIVE:  Patient Summary: Kristin Knight is a 75 y.o. with a pertinent PMH of pretension, PAD on warfarin, chronic pain syndrome and COPD, who presented with extremity edema and dyspnea on exertion and admitted for acute on chronic heart failure on hospital day 2.   Overnight Events: No overnight events  Interim History: Patient assessed at bedside this AM.  She states that her shortness of breath has improved from yesterday and she is also noted improvement in her lower extremity edema.    OBJECTIVE:  Vital Signs: Vitals:   09/11/21 1948 09/11/21 2300 09/12/21 0020 09/12/21 0421  BP: (!) 161/109  (!) 123/96 (!) 137/107  Pulse: 77  84 69  Resp: 16  20 18   Temp: 98 F (36.7 C)  98.4 F (36.9 C) 98.4 F (36.9 C)  TempSrc: Oral   Oral  SpO2: 99%  94% 96%  Weight:    48.8 kg  Height:  5\' 4"  (1.626 m)     Supplemental O2: Room Air SpO2: 96 %  Filed Weights   09/11/21 0831 09/12/21 0421  Weight: 50.3 kg 48.8 kg     Intake/Output Summary (Last 24 hours) at 09/12/2021 8182 Last data filed at 09/11/2021 2300 Gross per 24 hour  Intake --  Output 300 ml  Net -300 ml   Net IO Since Admission: -300 mL [09/12/21 0614]  Physical Exam: General: Well-developed, well-nourished elderly female HENT: NCAT Eyes: No scleral icterus, conjunctiva clear CV: mild JVD present to mid chin, normal rate, no murmurs  Pulm: Diffuse rales present bilaterally, normal pulmonary effort GI: soft, non distended, no tenderness MSK: 1+ pitting edema to mid shin on left leg, trace edema to right leg, normal bulk and tone Skin: warm and dry Psych: normal mood and affect  Patient Lines/Drains/Airways Status     Active Line/Drains/Airways     Name Placement date Placement time Site Days   Peripheral IV 09/11/21 20 G Anterior;Proximal;Right Forearm 09/11/21  2356  Forearm  1            Pertinent Labs: CBC Latest Ref Rng & Units 09/12/2021 09/11/2021 12/26/2020  WBC 4.0 - 10.5 K/uL  3.7(L) 4.8 3.9  Hemoglobin 12.0 - 15.0 g/dL 13.4 12.1 12.4  Hematocrit 36.0 - 46.0 % 42.8 39.5 39.2  Platelets 150 - 400 K/uL 235 216 196    CMP Latest Ref Rng & Units 09/12/2021 09/11/2021 12/26/2020  Glucose 70 - 99 mg/dL 112(H) 146(H) 112(H)  BUN 8 - 23 mg/dL 6(L) 7(L) 20  Creatinine 0.44 - 1.00 mg/dL 0.95 0.95 1.02(H)  Sodium 135 - 145 mmol/L 141 140 142  Potassium 3.5 - 5.1 mmol/L 3.7 3.1(L) 3.7  Chloride 98 - 111 mmol/L 100 105 100  CO2 22 - 32 mmol/L 31 25 27   Calcium 8.9 - 10.3 mg/dL 10.1 8.8(L) 9.6  Total Protein 6.5 - 8.1 g/dL - 6.7 7.0  Total Bilirubin 0.3 - 1.2 mg/dL - 0.4 <0.2  Alkaline Phos 38 - 126 U/L - 65 53  AST 15 - 41 U/L - 34 15  ALT 0 - 44 U/L - 15 7    No results for input(s): GLUCAP in the last 72 hours.   Pertinent Imaging: DG Chest 2 View  Result Date: 09/11/2021 CLINICAL DATA:  Cough and dyspnea. History of chronic obstructive pulmonary disease. EXAM: CHEST - 2 VIEW COMPARISON:  Radiographs 06/23/2015 and 04/01/2015.  CT 06/22/2008. FINDINGS: There is a lesser degree of inspiration. Allowing for this, the heart size and  mediastinal contours are stable. There is poor definition of the pulmonary vasculature with probable edema, patchy bibasilar airspace opacities and small pleural effusions. There is a slightly confluent component within the lingula, best seen on the lateral view. No pneumothorax. The bones appear unremarkable. Telemetry leads overlie the chest. IMPRESSION: Probable pulmonary edema with small pleural effusions and patchy bibasilar atelectasis. Early pneumonia difficult to exclude, especially within the lingula. Recommend radiographic follow-up. Electronically Signed   By: Richardean Sale M.D.   On: 09/11/2021 09:01    ASSESSMENT/PLAN:  Assessment: Active Problems:   Acute heart failure [facility])  Kristin Knight is a 75 y.o. with a pertinent PMH of pretension, PAD on warfarin, chronic pain syndrome and COPD, who presented with extremity edema  and dyspnea on exertion and admitted for acute on chronic heart failure on hospital day 2.   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  BNP elevated at 968.  No previous diagnosis of heart failure.  Troponin negative and EKG does not show any signs concerning for acute ischemia.   Echo showed EF of 25 to 30%.  The left ventricle was severely decreased function with global hypokinesis and mild left ventricular hypertrophy.  Cardiology consulted.  Patient was evaluated and due to nonanginal symptoms, negative troponins and EKG with no ST changes, cardiology with like to do further ischemic work-up as outpatient.  Plans to optimize management of heart failure while in hospital.  -Started metoprolol succinate 25 mg p.o. daily -Holding Entresto with plan to start ARNI PM of 10/30 per cardiology -Continue Lipitor -Strict I's and O's -Check daily weights -Consider starting SGLT2 inhibitor as outpatient -May consider starting Aldactone based on follow-up labs per cardiology -Replace electrolytes as needed to keep potassium greater than 4 and magnesium greater than 2  #Severe asymptomatic hypertension Patient was on lisinopril and triamterene-HCTZ at home.  With new diagnosis of HFrEF, will transition her medications to medically optimize.  -Holding Entresto with plans to start ARNI 10/30 PM  #PAOD History of left femoral to [place] popliteal bypass with PTFE in 2012 and subsequent occlusion of bypass in 2013 and 2019.  Per pharmacy, she has not been taking her Plavix.  We will plan to restart at this time and emphasized its importance.  LDL of 107, not at goal for primary prevention on lipid panel.    ABIs repeated showing normal range for ABI in right and left lower extremity.  -Atorvastatin 40 mg, will follow-up on adherence to medication to  determine if additional medication needed to bring LDL to goal for primary prevention. -Continue Plavix and warfarin  #COPD PFT completed in 2009 with FEV1/FVC 0.72, FEV1 1.92 Gold stage 1.  No recent documentation of COPD exacerbation.  -Albuterol inhaler  Best Practice: Diet: Cardiac diet IVF: Fluids: none VTE: On warfarin for PAOD Code: Full AB: none Therapy Recs: None, DME: none Family Contact: husband updated at bedside  DISPO: Anticipated discharge tomorrow to Home pending Medical stability.  Signature: Christiana Fuchs, D.O. Internal Medicine Resident, PGY-1 [facility]  Pager: [phone] 6:14 AM, 09/12/2021   Please contact the on call pager after 5 pm and on weekends at [phone].
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
## 2021-09-13 DIAGNOSIS — I5041 Acute combined systolic (congestive) and diastolic (congestive) heart failure: Secondary | ICD-10-CM | POA: Diagnosis not present

## 2021-09-13 LAB — CBC
HCT: 42.8 % (ref 36.0–46.0)
Hemoglobin: 13.4 g/dL (ref 12.0–15.0)
MCH: 28.7 pg (ref 26.0–34.0)
MCHC: 31.3 g/dL (ref 30.0–36.0)
MCV: 91.6 fL (ref 80.0–100.0)
Platelets: 234 10*3/uL (ref 150–400)
RBC: 4.67 MIL/uL (ref 3.87–5.11)
RDW: 14.4 % (ref 11.5–15.5)
WBC: 6.1 10*3/uL (ref 4.0–10.5)
nRBC: 0 % (ref 0.0–0.2)

## 2021-09-13 LAB — BASIC METABOLIC PANEL
Anion gap: 7 (ref 5–15)
BUN: 12 mg/dL (ref 8–23)
CO2: 32 mmol/L (ref 22–32)
Calcium: 9.5 mg/dL (ref 8.9–10.3)
Chloride: 99 mmol/L (ref 98–111)
Creatinine, Ser: 1.05 mg/dL — ABNORMAL HIGH (ref 0.44–1.00)
GFR, Estimated: 56 mL/min — ABNORMAL LOW (ref >60)
Glucose, Bld: 107 mg/dL — ABNORMAL HIGH (ref 70–99)
Potassium: 3.9 mmol/L (ref 3.5–5.1)
Sodium: 138 mmol/L (ref 135–145)

## 2021-09-13 LAB — PROTIME-INR
INR: 5.8 (ref 0.8–1.2)
Prothrombin Time: 52.2 seconds — ABNORMAL HIGH (ref 11.4–15.2)

## 2021-09-13 LAB — MAGNESIUM: Magnesium: 2.1 mg/dL (ref 1.7–2.4)

## 2021-09-13 MED ORDER — POTASSIUM CHLORIDE 20 MEQ PO PACK
20.0000 meq | PACK | Freq: Once | ORAL | Status: AC
  Administered 2021-09-13: 20 meq via ORAL
  Filled 2021-09-13: qty 1

## 2021-09-13 MED ORDER — FUROSEMIDE 10 MG/ML IJ SOLN
80.0000 mg | Freq: Two times a day (BID) | INTRAMUSCULAR | Status: DC
  Administered 2021-09-13: 80 mg via INTRAVENOUS
  Filled 2021-09-13 (×2): qty 8

## 2021-09-13 NOTE — Progress Notes (Signed)
HD#1 Subjective:  Overnight Events: None  Kristin Knight is doing well this morning.  She sitting upright at the edge of the bed eating breakfast.  She denies chest pain, shortness of breath.  She feels as though her lower extremity swelling has improved significantly.  She felt fatigued when walking to and from the bathroom.  She states she did have 1 episode abdominal pain that was relieved by passing gas and having a bowel movement.  Discussed with her that we are making medication changes and that she will follow-up with cardiology in outpatient basis.  She had no other acute questions or concerns at this time.  Objective:  Vital signs in last 24 hours: Vitals:   09/12/21 1419 09/12/21 1603 09/12/21 1959 09/13/21 0450  BP: 97/70 95/64 101/76 122/75  Pulse: 66 70 65 69  Resp: 18  18 20   Temp:   98.1 F (36.7 C) 98.6 F (37 C)  TempSrc:   Oral Oral  SpO2: 99% 100% 95% 93%  Weight:    48.6 kg  Height:       Supplemental O2: Room Air SpO2: 93 %   Physical Exam:  Constitutional: Well-appearing, elderly patient.  No acute distress HENT: normocephalic atraumatic Eyes: conjunctiva non-erythematous Neck: supple Cardiovascular: regular rate and rhythm, no m/r/g.  No JVD.  No lower extremity edema Pulmonary/Chest: normal work of breathing on room air, lungs clear to auscultation bilaterally Abdominal: soft, non-distended MSK: normal bulk and tone Neurological: alert & oriented x 3 Skin: warm and dry Psych: Normal mood and thought process  Filed Weights   09/11/21 0831 09/12/21 0421 09/13/21 0450  Weight: 50.3 kg 48.8 kg 48.6 kg     Intake/Output Summary (Last 24 hours) at 09/13/2021 0855 Last data filed at 09/13/2021 0453 Gross per 24 hour  Intake 32.62 ml  Output 1100 ml  Net -1067.38 ml   Net IO Since Admission: -1,367.38 mL [09/13/21 0855]  Pertinent Labs: CBC Latest Ref Rng & Units 09/13/2021 09/12/2021 09/12/2021  WBC 4.0 - 10.5 K/uL 6.1 3.9(L) 3.7(L)   Hemoglobin 12.0 - 15.0 g/dL 13.4 14.1 13.4  Hematocrit 36.0 - 46.0 % 42.8 45.3 42.8  Platelets 150 - 400 K/uL 234 259 235    CMP Latest Ref Rng & Units 09/13/2021 09/12/2021 09/11/2021  Glucose 70 - 99 mg/dL 107(H) 112(H) 146(H)  BUN 8 - 23 mg/dL 12 6(L) 7(L)  Creatinine 0.44 - 1.00 mg/dL 1.05(H) 0.95 0.95  Sodium 135 - 145 mmol/L 138 141 140  Potassium 3.5 - 5.1 mmol/L 3.9 3.7 3.1(L)  Chloride 98 - 111 mmol/L 99 100 105  CO2 22 - 32 mmol/L 32 31 25  Calcium 8.9 - 10.3 mg/dL 9.5 10.1 8.8(L)  Total Protein 6.5 - 8.1 g/dL - - 6.7  Total Bilirubin 0.3 - 1.2 mg/dL - - 0.4  Alkaline Phos 38 - 126 U/L - - 65  AST 15 - 41 U/L - - 34  ALT 0 - 44 U/L - - 15    Imaging: VAS [place] ABI WITH/WO TBI  Result Date: 09/12/2021  LOWER EXTREMITY DOPPLER STUDY Patient Name:  Kristin Knight  Date of Exam:   09/12/2021 Medical Rec #: [id number]        Accession #:    [id number] Date of Birth: [birth year]        Patient Gender: F Patient Age:   89 years Exam Location:  [facility] Procedure:      VAS [place] ABI WITH/WO TBI Referring Phys:  EMILY MULLEN --------------------------------------------------------------------------------  Indications: Peripheral artery disease, and Patient endorses cramping of legs,              especially at night. L>R. High Risk Factors: Hypertension, past history of smoking.  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 Comparison Study: Prior ABI done 05/05/20 Performing Technologist: Sharion Dove RVS  Examination Guidelines: A complete evaluation includes at minimum, Doppler waveform signals and systolic blood pressure reading at the level of bilateral brachial, anterior tibial, and posterior tibial arteries, when vessel segments are accessible. Bilateral testing is considered an integral part of a complete examination. Photoelectric  Plethysmograph (PPG) waveforms and toe systolic pressure readings are included as required and additional duplex testing as needed. Limited examinations for reoccurring indications may be performed as noted.  ABI Findings: +---------+------------------+-----+-----------+--------+ Right    Rt Pressure (mmHg)IndexWaveform   Comment  +---------+------------------+-----+-----------+--------+ Brachial 137                    multiphasic         +---------+------------------+-----+-----------+--------+ PTA      147               0.99 multiphasic         +---------+------------------+-----+-----------+--------+ DP       137               0.93 multiphasic         +---------+------------------+-----+-----------+--------+ Great Toe88                0.59                     +---------+------------------+-----+-----------+--------+ +---------+------------------+-----+-----------+-------+ Left     Lt Pressure (mmHg)IndexWaveform   Comment +---------+------------------+-----+-----------+-------+ Brachial 148                    multiphasic        +---------+------------------+-----+-----------+-------+ PTA      163               1.10 multiphasic        +---------+------------------+-----+-----------+-------+ DP       167               1.13 multiphasic        +---------+------------------+-----+-----------+-------+ Great Toe98                0.66                    +---------+------------------+-----+-----------+-------+ +-------+-----------+-----------+------------+------------+ ABI/TBIToday's ABIToday's TBIPrevious ABIPrevious TBI +-------+-----------+-----------+------------+------------+ Right  0.99       0.59       1.02        0.50         +-------+-----------+-----------+------------+------------+ Left   1.13       0.66       1.12        0.52         +-------+-----------+-----------+------------+------------+ Bilateral ABIs appear essentially  unchanged compared to prior study on 05/05/20. Left TBIs appear increased compared to prior study on 05/05/20.  Summary: Right: Resting right ankle-brachial index is within normal range. No evidence of significant  right lower extremity arterial disease. The right toe-brachial index is abnormal. Left: Resting left ankle-brachial index is within normal range. No evidence of significant left lower extremity arterial disease. The left toe-brachial index is abnormal.  *See table(s) above for measurements and observations.  Electronically signed by Jamelle Haring on 09/12/2021 at 10:30:14 AM.    Final    ECHOCARDIOGRAM COMPLETE  Result Date: 09/12/2021    ECHOCARDIOGRAM REPORT   Patient Name:   Kristin Knight Date of Exam: 09/12/2021 Medical Rec #:  [id number]       Height:       64.0 in Accession #:    [id number]      Weight:       107.5 lb Date of Birth:  10/26/1946       BSA:          1.503 m Patient Age:    41 years        BP:           137/112 mmHg Patient Gender: F               HR:           88 bpm. Exam Location:  Inpatient Procedure: 2D Echo, Cardiac Doppler and Color Doppler Indications:    Dyspnea  History:        Patient has no prior history of Echocardiogram examinations.                 CHF, COPD; Risk Factors:Hypertension and Current Smoker.  Sonographer:    Wenda Low Referring Phys: [place]  1. Left ventricular ejection fraction, by estimation, is 25 to 30%. The left ventricle has severely decreased function. The left ventricle demonstrates global hypokinesis. There is mild left ventricular hypertrophy. Left ventricular diastolic parameters  are consistent with Grade I diastolic dysfunction (impaired relaxation).  2. Right ventricular systolic function is normal. The right ventricular size is normal. Tricuspid regurgitation signal is inadequate for assessing PA pressure.  3. The mitral valve is normal in structure. Trivial mitral valve regurgitation. No evidence of mitral  stenosis.  4. The aortic valve is tricuspid. Aortic valve regurgitation is not visualized. Mild aortic valve sclerosis is present, with no evidence of aortic valve stenosis.  5. The inferior vena cava is normal in size with greater than 50% respiratory variability, suggesting right atrial pressure of 3 mmHg. Comparison(s): No prior Echocardiogram. FINDINGS  Left Ventricle: Left ventricular ejection fraction, by estimation, is 25 to 30%. The left ventricle has severely decreased function. The left ventricle demonstrates global hypokinesis. The left ventricular internal cavity size was normal in size. There is mild left ventricular hypertrophy. Left ventricular diastolic parameters are consistent with Grade I diastolic dysfunction (impaired relaxation). Right Ventricle: The right ventricular size is normal. Right ventricular systolic function is normal. Tricuspid regurgitation signal is inadequate for assessing PA pressure. The tricuspid regurgitant velocity is 2.36 m/s, and with an assumed right atrial  pressure of 3 mmHg, the estimated right ventricular systolic pressure is 93.8 mmHg. Left Atrium: Left atrial size was normal in size. Right Atrium: Right atrial size was normal in size. Pericardium: There is no evidence of pericardial effusion. Mitral Valve: The mitral valve is normal in structure. Trivial mitral valve regurgitation. No evidence of mitral valve stenosis. MV peak gradient, 1.0 mmHg. The mean mitral valve gradient is 0.0 mmHg. Tricuspid Valve: The tricuspid valve is normal in structure. Tricuspid valve regurgitation is mild . No evidence of tricuspid stenosis. Aortic  Valve: The aortic valve is tricuspid. Aortic valve regurgitation is not visualized. Mild aortic valve sclerosis is present, with no evidence of aortic valve stenosis. Aortic valve mean gradient measures 2.0 mmHg. Aortic valve peak gradient measures 4.2 mmHg. Aortic valve area, by VTI measures 1.72 cm. Pulmonic Valve: The pulmonic valve was  normal in structure. Pulmonic valve regurgitation is not visualized. No evidence of pulmonic stenosis. Aorta: The aortic root is normal in size and structure. Venous: The inferior vena cava is normal in size with greater than 50% respiratory variability, suggesting right atrial pressure of 3 mmHg. IAS/Shunts: No atrial level shunt detected by color flow Doppler.  LEFT VENTRICLE PLAX 2D LVIDd:         4.10 cm     Diastology LVIDs:         3.00 cm     LV e' medial:    3.78 cm/s LV PW:         1.20 cm     LV E/e' medial:  8.0 LV IVS:        1.10 cm     LV e' lateral:   3.85 cm/s LVOT diam:     1.90 cm     LV E/e' lateral: 7.9 LV SV:         33 LV SV Index:   22 LVOT Area:     2.84 cm  LV Volumes (MOD) LV vol d, MOD A2C: 45.7 ml LV vol d, MOD A4C: 47.5 ml LV vol s, MOD A2C: 21.2 ml LV vol s, MOD A4C: 25.2 ml LV SV MOD A2C:     24.5 ml LV SV MOD A4C:     47.5 ml LV SV MOD BP:      24.8 ml RIGHT VENTRICLE RV Basal diam:  2.90 cm RV Mid diam:    2.30 cm RV S prime:     10.30 cm/s TAPSE (M-mode): 2.6 cm LEFT ATRIUM             Index        RIGHT ATRIUM           Index LA diam:        2.90 cm 1.93 cm/m   RA Area:     13.30 cm LA Vol (A2C):   46.1 ml 30.68 ml/m  RA Volume:   31.80 ml  21.16 ml/m LA Vol (A4C):   21.5 ml 14.31 ml/m LA Biplane Vol: 34.2 ml 22.76 ml/m  AORTIC VALVE                    PULMONIC VALVE AV Area (Vmax):    1.91 cm     PV Vmax:       0.67 m/s AV Area (Vmean):   1.63 cm     PV Peak grad:  1.8 mmHg AV Area (VTI):     1.72 cm AV Vmax:           103.00 cm/s AV Vmean:          70.800 cm/s AV VTI:            0.193 m AV Peak Grad:      4.2 mmHg AV Mean Grad:      2.0 mmHg LVOT Vmax:         69.50 cm/s LVOT Vmean:        40.800 cm/s LVOT VTI:          0.117 m LVOT/AV VTI ratio: 0.61  AORTA Ao  Root diam: 2.70 cm MITRAL VALVE               TRICUSPID VALVE MV Area (PHT): 4.12 cm    TR Peak grad:   22.3 mmHg MV Area VTI:   3.48 cm    TR Vmax:        236.00 cm/s MV Peak grad:  1.0 mmHg MV Mean grad:  0.0  mmHg    SHUNTS MV Vmax:       0.49 m/s    Systemic VTI:  0.12 m MV Vmean:      24.6 cm/s   Systemic Diam: 1.90 cm MV Decel Time: 184 msec MV E velocity: 30.40 cm/s MV A velocity: 45.50 cm/s MV E/A ratio:  0.67 Kirk Ruths MD Electronically signed by Kirk Ruths MD Signature Date/Time: 09/12/2021/11:32:25 AM    Final     Assessment/Plan:   Active Problems:   Acute congestive heart failure [facility])   Patient Summary: Kristin Knight is a 75 y.o. with a pertinent PMH of pretension, PAD on warfarin, chronic pain syndrome and COPD, who presented with extremity edema and dyspnea on exertion and admitted for acute on chronic heart failure on hospital day 3.   New Dx of Heart Failure with reduced ejection fraction (25-30%) NYHA Class III Stg C Echo showed EF of 25 to 30%.  The left ventricle was severely decreased function with global hypokinesis and mild left ventricular hypertrophy. Diuresing well on IV lasix. I/O -1L overnight. Weight down 2 kg since admission. Euvolemic on exam. Cardiology assisting with medication optimization. Will perform ischemic evaluation on outpatient basis. Believe she needs one more day of IV diuretics and can plan to transition to PO tomorrow.  -Appreciate cardiology assistance in her care -IV Lasix 80 mg BID -Continue metoprolol succinate 25 mg p.o. daily -Start ARNI once BP's improve, consider aldactone -Continue Lipitor -Strict I's and O's -Check daily weights -Consider starting SGLT2 inhibitor as outpatient -K above 4 and mag above 2  #PAOD On plavix and warfarin at home. Supratherputic warfarin levels since admission. Holding warfarin for now, repeat INR this am of 5.8.  -continue plavix, hold warfarin -repeat INR tomorrow   #Severe asymptomatic hypertension Patient was on lisinopril and triamterene-HCTZ at home.  With new diagnosis of HFrEF, will transition her medications to medically optimize. BP of 111/76. Denies lightheadedness or dizziness, but does  have some fatigue with ambulation.  -Holding ARNI 2/2 normotensive this am.    #COPD PFT completed in 2009 with FEV1/FVC 0.72, FEV1 1.92 Gold stage 1.  No recent documentation of COPD exacerbation. -Albuterol inhaler  Diet: Heart Healthy IVF: None,None VTE: Holding warfarin Code: Full  Dispo: Anticipated discharge to [facility] Resident PGY-2 Pager [phone] Please contact the on call pager after 5 pm and on weekends at [phone].
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
## 2021-09-13 NOTE — Progress Notes (Signed)
Received call from lab with critical INR level. Level was 5.8. Doctor made aware and stated to continue to hold Warfarin.

## 2021-09-13 NOTE — Progress Notes (Signed)
Progress Note  Patient Name: Kristin Knight Date of Encounter: 09/13/2021  Primary Cardiologist: New to [facility]  Subjective   INR's elevated in interim and warfarin was held.  Patient feels better but not back to baseline.  Still SOB  Inpatient Medications    Scheduled Meds:  atorvastatin  40 mg Oral Daily   clopidogrel  75 mg Oral Daily   furosemide  40 mg Intravenous BID   metoprolol succinate  25 mg Oral Daily   mirtazapine  30 mg Oral QHS   multivitamin with minerals  1 tablet Oral Daily   sodium chloride flush  3 mL Intravenous Q12H   Warfarin - Pharmacist Dosing Inpatient   Does not apply q1600   Continuous Infusions:  PRN Meds: acetaminophen **OR** acetaminophen, albuterol, oxyCODONE-acetaminophen **AND** oxyCODONE, polyethylene glycol   Vital Signs    Vitals:   09/12/21 1419 09/12/21 1603 09/12/21 1959 09/13/21 0450  BP: 97/70 95/64 101/76 122/75  Pulse: 66 70 65 69  Resp: 18  18 20   Temp:   98.1 F (36.7 C) 98.6 F (37 C)  TempSrc:   Oral Oral  SpO2: 99% 100% 95% 93%  Weight:    48.6 kg  Height:        Intake/Output Summary (Last 24 hours) at 09/13/2021 0854 Last data filed at 09/13/2021 0453 Gross per 24 hour  Intake 32.62 ml  Output 1100 ml  Net -1067.38 ml   Filed Weights   09/11/21 0831 09/12/21 0421 09/13/21 0450  Weight: 50.3 kg 48.8 kg 48.6 kg    Telemetry    SR - Personally Reviewed  ECG    No new - Personally Reviewed  Physical Exam   GEN: No acute distress.   Neck: elevated JVD Cardiac: RRR, no murmurs, rubs, or gallops.  Respiratory: [place] bilaterally GI: Soft, nontender, non-distended  MS: No edema; No deformity. Neuro:  Nonfocal  Psych: Normal affect   Labs    Chemistry Recent Labs  Lab 09/11/21 0833 09/12/21 0435 09/13/21 0619  NA 140 141 138  K 3.1* 3.7 3.9  CL 105 100 99  CO2 25 31 32  GLUCOSE 146* 112* 107*  BUN 7* 6* 12  CREATININE 0.95 0.95 1.05*  CALCIUM 8.8* 10.1 9.5  PROT 6.7  --    --   ALBUMIN 3.4*  --   --   AST 34  --   --   ALT 15  --   --   ALKPHOS 65  --   --   BILITOT 0.4  --   --   GFRNONAA >60 >60 56*  ANIONGAP 10 10 7      Hematology Recent Labs  Lab 09/12/21 0435 09/12/21 1151 09/13/21 0619  WBC 3.7* 3.9* 6.1  RBC 4.71 4.96 4.67  HGB 13.4 14.1 13.4  HCT 42.8 45.3 42.8  MCV 90.9 91.3 91.6  MCH 28.5 28.4 28.7  MCHC 31.3 31.1 31.3  RDW 14.4 14.5 14.4  PLT 235 259 234    Cardiac EnzymesNo results for input(s): TROPONINI in the last 168 hours. No results for input(s): TROPIPOC in the last 168 hours.   BNP Recent Labs  Lab 09/11/21 0833  BNP 968.0*     DDimer No results for input(s): DDIMER in the last 168 hours.   Radiology    DG Chest 2 View  Result Date: 09/11/2021 CLINICAL DATA:  Cough and dyspnea. History of chronic obstructive pulmonary disease. EXAM: CHEST - 2 VIEW COMPARISON:  Radiographs 06/23/2015 and 04/01/2015.  CT 06/22/2008. FINDINGS: There is a lesser degree of inspiration. Allowing for this, the heart size and mediastinal contours are stable. There is poor definition of the pulmonary vasculature with probable edema, patchy bibasilar airspace opacities and small pleural effusions. There is a slightly confluent component within the lingula, best seen on the lateral view. No pneumothorax. The bones appear unremarkable. Telemetry leads overlie the chest. IMPRESSION: Probable pulmonary edema with small pleural effusions and patchy bibasilar atelectasis. Early pneumonia difficult to exclude, especially within the lingula. Recommend radiographic follow-up. Electronically Signed   By: Richardean Sale M.D.   On: 09/11/2021 09:01   VAS [place] ABI WITH/WO TBI  Result Date: 09/12/2021  LOWER EXTREMITY DOPPLER STUDY Patient Name:  CALIOPE RUPPERT  Date of Exam:   09/12/2021 Medical Rec #: [id number]        Accession #:    [id number] Date of Birth: [birth year]        Patient Gender: F Patient Age:   65 years [facility]  Procedure:      VAS [place] ABI WITH/WO TBI Referring Phys: EMILY MULLEN --------------------------------------------------------------------------------  Indications: Peripheral artery disease, and Patient endorses cramping of legs,              especially at night. L>R. High Risk Factors: Hypertension, past history of smoking.  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 Comparison Study: Prior ABI done 05/05/20 Performing Technologist: Sharion Dove RVS  Examination Guidelines: A complete evaluation includes at minimum, Doppler waveform signals and systolic blood pressure reading at the level of bilateral brachial, anterior tibial, and posterior tibial arteries, when vessel segments are accessible. Bilateral testing is considered an integral part of a complete examination. Photoelectric Plethysmograph (PPG) waveforms and toe systolic pressure readings are included as required and additional duplex testing as needed. Limited examinations for reoccurring indications may be performed as noted.  ABI Findings: +---------+------------------+-----+-----------+--------+ Right    Rt Pressure (mmHg)IndexWaveform   Comment  +---------+------------------+-----+-----------+--------+ Brachial 137                    multiphasic         +---------+------------------+-----+-----------+--------+ PTA      147               0.99 multiphasic         +---------+------------------+-----+-----------+--------+ DP       137               0.93 multiphasic         +---------+------------------+-----+-----------+--------+ Great Toe88                0.59                     +---------+------------------+-----+-----------+--------+ +---------+------------------+-----+-----------+-------+ Left     Lt Pressure (mmHg)IndexWaveform   Comment  +---------+------------------+-----+-----------+-------+ Brachial 148                    multiphasic        +---------+------------------+-----+-----------+-------+ PTA      163               1.10 multiphasic        +---------+------------------+-----+-----------+-------+  DP       167               1.13 multiphasic        +---------+------------------+-----+-----------+-------+ Great Toe98                0.66                    +---------+------------------+-----+-----------+-------+ +-------+-----------+-----------+------------+------------+ ABI/TBIToday's ABIToday's TBIPrevious ABIPrevious TBI +-------+-----------+-----------+------------+------------+ Right  0.99       0.59       1.02        0.50         +-------+-----------+-----------+------------+------------+ Left   1.13       0.66       1.12        0.52         +-------+-----------+-----------+------------+------------+ Bilateral ABIs appear essentially unchanged compared to prior study on 05/05/20. Left TBIs appear increased compared to prior study on 05/05/20.  Summary: Right: Resting right ankle-brachial index is within normal range. No evidence of significant right lower extremity arterial disease. The right toe-brachial index is abnormal. Left: Resting left ankle-brachial index is within normal range. No evidence of significant left lower extremity arterial disease. The left toe-brachial index is abnormal.  *See table(s) above for measurements and observations.  Electronically signed by Jamelle Haring on 09/12/2021 at 10:30:14 AM.    Final    ECHOCARDIOGRAM COMPLETE  Result Date: 09/12/2021    ECHOCARDIOGRAM REPORT   Patient Name:   PERNELL DIKES Date of Exam: 09/12/2021 Medical Rec #:  [id number]       Height:       64.0 in Accession #:    [id number]      Weight:       107.5 lb Date of Birth:  [birth year]       BSA:          1.503 m Patient Age:    14 years        BP:           137/112 mmHg Patient Gender:  F               HR:           88 bpm. Exam Location:  Inpatient Procedure: 2D Echo, Cardiac Doppler and Color Doppler Indications:    Dyspnea  History:        Patient has no prior history of Echocardiogram examinations.                 CHF, COPD; Risk Factors:Hypertension and Current Smoker.  Sonographer:    Wenda Low Referring Phys: [place]  1. Left ventricular ejection fraction, by estimation, is 25 to 30%. The left ventricle has severely decreased function. The left ventricle demonstrates global hypokinesis. There is mild left ventricular hypertrophy. Left ventricular diastolic parameters  are consistent with Grade I diastolic dysfunction (impaired relaxation).  2. Right ventricular systolic function is normal. The right ventricular size is normal. Tricuspid regurgitation signal is inadequate for assessing PA pressure.  3. The mitral valve is normal in structure. Trivial mitral valve regurgitation. No evidence of mitral stenosis.  4. The aortic valve is tricuspid. Aortic valve regurgitation is not visualized. Mild aortic valve sclerosis is present, with no evidence of aortic valve stenosis.  5. The inferior vena cava is normal in size with greater than 50% respiratory variability, suggesting right atrial pressure of 3 mmHg. Comparison(s): No prior Echocardiogram. FINDINGS  Left  Ventricle: Left ventricular ejection fraction, by estimation, is 25 to 30%. The left ventricle has severely decreased function. The left ventricle demonstrates global hypokinesis. The left ventricular internal cavity size was normal in size. There is mild left ventricular hypertrophy. Left ventricular diastolic parameters are consistent with Grade I diastolic dysfunction (impaired relaxation). Right Ventricle: The right ventricular size is normal. Right ventricular systolic function is normal. Tricuspid regurgitation signal is inadequate for assessing PA pressure. The tricuspid regurgitant velocity is 2.36  m/s, and with an assumed right atrial  pressure of 3 mmHg, the estimated right ventricular systolic pressure is 96.2 mmHg. Left Atrium: Left atrial size was normal in size. Right Atrium: Right atrial size was normal in size. Pericardium: There is no evidence of pericardial effusion. Mitral Valve: The mitral valve is normal in structure. Trivial mitral valve regurgitation. No evidence of mitral valve stenosis. MV peak gradient, 1.0 mmHg. The mean mitral valve gradient is 0.0 mmHg. Tricuspid Valve: The tricuspid valve is normal in structure. Tricuspid valve regurgitation is mild . No evidence of tricuspid stenosis. Aortic Valve: The aortic valve is tricuspid. Aortic valve regurgitation is not visualized. Mild aortic valve sclerosis is present, with no evidence of aortic valve stenosis. Aortic valve mean gradient measures 2.0 mmHg. Aortic valve peak gradient measures 4.2 mmHg. Aortic valve area, by VTI measures 1.72 cm. Pulmonic Valve: The pulmonic valve was normal in structure. Pulmonic valve regurgitation is not visualized. No evidence of pulmonic stenosis. Aorta: The aortic root is normal in size and structure. Venous: The inferior vena cava is normal in size with greater than 50% respiratory variability, suggesting right atrial pressure of 3 mmHg. IAS/Shunts: No atrial level shunt detected by color flow Doppler.  LEFT VENTRICLE PLAX 2D LVIDd:         4.10 cm     Diastology LVIDs:         3.00 cm     LV e' medial:    3.78 cm/s LV PW:         1.20 cm     LV E/e' medial:  8.0 LV IVS:        1.10 cm     LV e' lateral:   3.85 cm/s LVOT diam:     1.90 cm     LV E/e' lateral: 7.9 LV SV:         33 LV SV Index:   22 LVOT Area:     2.84 cm  LV Volumes (MOD) LV vol d, MOD A2C: 45.7 ml LV vol d, MOD A4C: 47.5 ml LV vol s, MOD A2C: 21.2 ml LV vol s, MOD A4C: 25.2 ml LV SV MOD A2C:     24.5 ml LV SV MOD A4C:     47.5 ml LV SV MOD BP:      24.8 ml RIGHT VENTRICLE RV Basal diam:  2.90 cm RV Mid diam:    2.30 cm RV S prime:      10.30 cm/s TAPSE (M-mode): 2.6 cm LEFT ATRIUM             Index        RIGHT ATRIUM           Index LA diam:        2.90 cm 1.93 cm/m   RA Area:     13.30 cm LA Vol (A2C):   46.1 ml 30.68 ml/m  RA Volume:   31.80 ml  21.16 ml/m LA Vol (A4C):   21.5 ml 14.31 ml/m LA  Biplane Vol: 34.2 ml 22.76 ml/m  AORTIC VALVE                    PULMONIC VALVE AV Area (Vmax):    1.91 cm     PV Vmax:       0.67 m/s AV Area (Vmean):   1.63 cm     PV Peak grad:  1.8 mmHg AV Area (VTI):     1.72 cm AV Vmax:           103.00 cm/s AV Vmean:          70.800 cm/s AV VTI:            0.193 m AV Peak Grad:      4.2 mmHg AV Mean Grad:      2.0 mmHg LVOT Vmax:         69.50 cm/s LVOT Vmean:        40.800 cm/s LVOT VTI:          0.117 m LVOT/AV VTI ratio: 0.61  AORTA Ao Root diam: 2.70 cm MITRAL VALVE               TRICUSPID VALVE MV Area (PHT): 4.12 cm    TR Peak grad:   22.3 mmHg MV Area VTI:   3.48 cm    TR Vmax:        236.00 cm/s MV Peak grad:  1.0 mmHg MV Mean grad:  0.0 mmHg    SHUNTS MV Vmax:       0.49 m/s    Systemic VTI:  0.12 m MV Vmean:      24.6 cm/s   Systemic Diam: 1.90 cm MV Decel Time: 184 msec MV E velocity: 30.40 cm/s MV A velocity: 45.50 cm/s MV E/A ratio:  0.67 Kirk Ruths MD Electronically signed by Kirk Ruths MD Signature Date/Time: 09/12/2021/11:32:25 AM    Final       Patient Profile     75 y.o. female PAD and new, decompensated HF  Assessment & Plan   Heart Failure Reduced Ejection Fraction  In the setting of PAD on warfain and plavix, COPD NOS, and HTN - NYHA class III, Stage C, hypervolemic, etiology from unclear; ischemia is on the differential - No plans for inpatient ischemic eval at this time (see 09/12/21 note) - Diuretic regimen: Increase to 80 IV lasix VID - continue succinate 25 mg PO daily - ARNI hold because of relatively low BP - aldactone may be next addition to regimen - SGLT2i as outpatient - agree with lipitor - warfarin and plavix are home meds for PAD per vasc  surgery  For questions or updates, please contact [facility] Please consult [email or web address] for contact info under Cardiology/STEMI.      Signed, Werner Lean, MD  09/13/2021, 8:54 AM

## 2021-09-14 ENCOUNTER — Encounter (HOSPITAL_COMMUNITY): Payer: Self-pay | Admitting: Internal Medicine

## 2021-09-14 DIAGNOSIS — I5041 Acute combined systolic (congestive) and diastolic (congestive) heart failure: Secondary | ICD-10-CM | POA: Diagnosis not present

## 2021-09-14 DIAGNOSIS — I429 Cardiomyopathy, unspecified: Secondary | ICD-10-CM | POA: Diagnosis not present

## 2021-09-14 DIAGNOSIS — I739 Peripheral vascular disease, unspecified: Secondary | ICD-10-CM | POA: Diagnosis not present

## 2021-09-14 DIAGNOSIS — E7800 Pure hypercholesterolemia, unspecified: Secondary | ICD-10-CM

## 2021-09-14 DIAGNOSIS — J449 Chronic obstructive pulmonary disease, unspecified: Secondary | ICD-10-CM

## 2021-09-14 DIAGNOSIS — E876 Hypokalemia: Secondary | ICD-10-CM | POA: Diagnosis not present

## 2021-09-14 LAB — BASIC METABOLIC PANEL
Anion gap: 7 (ref 5–15)
BUN: 20 mg/dL (ref 8–23)
CO2: 32 mmol/L (ref 22–32)
Calcium: 9.4 mg/dL (ref 8.9–10.3)
Chloride: 97 mmol/L — ABNORMAL LOW (ref 98–111)
Creatinine, Ser: 1.21 mg/dL — ABNORMAL HIGH (ref 0.44–1.00)
GFR, Estimated: 47 mL/min — ABNORMAL LOW (ref >60)
Glucose, Bld: 90 mg/dL (ref 70–99)
Potassium: 3.6 mmol/L (ref 3.5–5.1)
Sodium: 136 mmol/L (ref 135–145)

## 2021-09-14 LAB — PROTIME-INR
INR: 3.1 — ABNORMAL HIGH (ref 0.8–1.2)
Prothrombin Time: 31.6 seconds — ABNORMAL HIGH (ref 11.4–15.2)

## 2021-09-14 LAB — T3: T3, Total: 163 ng/dL (ref 71–180)

## 2021-09-14 LAB — MAGNESIUM: Magnesium: 1.9 mg/dL (ref 1.7–2.4)

## 2021-09-14 MED ORDER — ATORVASTATIN CALCIUM 40 MG PO TABS: 40.0000 mg | ORAL_TABLET | Freq: Every day | ORAL | 1 refills | Status: DC

## 2021-09-14 MED ORDER — METOPROLOL SUCCINATE ER 25 MG PO TB24: 25.0000 mg | ORAL_TABLET | Freq: Every day | ORAL | 2 refills | Status: DC

## 2021-09-14 MED ORDER — WARFARIN SODIUM 3 MG PO TABS: 3.0000 mg | ORAL_TABLET | Freq: Every day | ORAL | 2 refills | Status: DC

## 2021-09-14 MED ORDER — POTASSIUM CHLORIDE 20 MEQ PO PACK
20.0000 meq | PACK | Freq: Two times a day (BID) | ORAL | Status: DC
  Administered 2021-09-14: 20 meq via ORAL
  Filled 2021-09-14: qty 1

## 2021-09-14 MED ORDER — FUROSEMIDE 40 MG PO TABS: 40.0000 mg | ORAL_TABLET | Freq: Every day | ORAL | Status: DC

## 2021-09-14 MED ORDER — FUROSEMIDE 40 MG PO TABS
40.0000 mg | ORAL_TABLET | Freq: Every day | ORAL | 1 refills | Status: DC
Start: 2021-09-15 — End: 2021-11-30

## 2021-09-14 MED ORDER — WARFARIN SODIUM 3 MG PO TABS
3.0000 mg | ORAL_TABLET | Freq: Every day | ORAL | Status: DC
  Administered 2021-09-14: 3 mg via ORAL
  Filled 2021-09-14: qty 1

## 2021-09-14 MED ORDER — MAGNESIUM SULFATE 2 GM/50ML IV SOLN
2.0000 g | Freq: Once | INTRAVENOUS | Status: AC
  Administered 2021-09-14: 2 g via INTRAVENOUS
  Filled 2021-09-14: qty 50

## 2021-09-14 NOTE — Progress Notes (Signed)
ANTICOAGULATION CONSULT NOTE - Follow Up Consult  Pharmacy Consult for Coumadin Indication:  PAD  Allergies  Allergen Reactions   Penicillins Anaphylaxis and Other (See Comments)    Patient passed out Has patient had a PCN reaction causing immediate rash, facial/tongue/throat swelling, SOB or lightheadedness with hypotension: No Has patient had a PCN reaction causing severe rash involving mucus membranes or skin necrosis: No Has patient had a PCN reaction that required hospitalization: No Has patient had a PCN reaction occurring within the last 10 years: No If all of the above answers are "NO", then may proceed with Cephalosporin use.    Meperidine Hcl Other (See Comments)    nevousness (Demerol)   Chantix [Varenicline] Nausea And Vomiting    Patient Measurements: Height: 5\' 4"  (162.6 cm) Weight: 48.6 kg (107 lb 1.6 oz) IBW/kg (Calculated) : 54.7  Vital Signs: Temp: 98.4 F (36.9 C) (10/31 0413) Temp Source: Oral (10/31 0413) BP: 108/71 (10/31 0413) Pulse Rate: 60 (10/31 0413)  Labs: Recent Labs    09/11/21 0833 09/12/21 0435 09/12/21 1151 09/13/21 0619 09/14/21 0321  HGB 12.1 13.4 14.1 13.4  --   HCT 39.5 42.8 45.3 42.8  --   PLT 216 235 259 234  --   LABPROT 29.3*  --  62.1* 52.2* 31.6*  INR 2.8*  --  7.3* 5.8* 3.1*  CREATININE 0.95 0.95  --  1.05* 1.21*  TROPONINIHS 5  --   --   --   --     Estimated Creatinine Clearance: 31.3 mL/min (A) (by C-G formula based on SCr of 1.21 mg/dL (H)).    Assessment: Anticoag: warfarin for vascular graft thrombosis - INR 7.3>5.8>3.1 today. CBC WNL. - PTA Warfarin 4mg  daily  Goal of Therapy:  INR 2-3 Monitor platelets by anticoagulation protocol: Yes   Plan:  Coumadin 3mg  po qPM starting today. Daily INR.   Chelisa Hennen S. Alford Highland, PharmD, BCPS Clinical Staff Pharmacist [email or web address]  Alford Highland, [employer] 09/14/2021,7:38 AM

## 2021-09-14 NOTE — Progress Notes (Signed)
Progress Note  Patient Name: Kristin Knight Date of Encounter: 09/14/2021  [facility] Cardiologist: Werner Lean, MD   Subjective   No acute events overnight. She has been ambulating up and down the hall, on room air. Feels shortness of breath is improved. No LE edema. Asking about timing of cath. No chest pain.  Inpatient Medications    Scheduled Meds:  atorvastatin  40 mg Oral Daily   clopidogrel  75 mg Oral Daily   [START ON 09/15/2021] furosemide  40 mg Oral Daily   metoprolol succinate  25 mg Oral Daily   mirtazapine  30 mg Oral QHS   multivitamin with minerals  1 tablet Oral Daily   potassium chloride  20 mEq Oral BID   sodium chloride flush  3 mL Intravenous Q12H   warfarin  3 mg Oral q1600   Warfarin - Pharmacist Dosing Inpatient   Does not apply q1600   Continuous Infusions:  magnesium sulfate bolus IVPB     PRN Meds: acetaminophen **OR** acetaminophen, albuterol, oxyCODONE-acetaminophen **AND** oxyCODONE, polyethylene glycol   Vital Signs    Vitals:   09/13/21 0450 09/13/21 0902 09/13/21 2039 09/14/21 0413  BP: 122/75 111/76 109/66 108/71  Pulse: 69 70 (!) 57 60  Resp: 20  14 14   Temp: 98.6 F (37 C) 98.6 F (37 C) 98.4 F (36.9 C) 98.4 F (36.9 C)  TempSrc: Oral Oral Oral Oral  SpO2: 93% 94% 96% 93%  Weight: 48.6 kg   48.6 kg  Height:        Intake/Output Summary (Last 24 hours) at 09/14/2021 1031 Last data filed at 09/14/2021 0824 Gross per 24 hour  Intake 960 ml  Output 500 ml  Net 460 ml   Last 3 Weights 09/14/2021 09/13/2021 09/12/2021  Weight (lbs) 107 lb 1.6 oz 107 lb 3.2 oz 107 lb 8 oz  Weight (kg) 48.58 kg 48.626 kg 48.762 kg      Telemetry    SR - Personally Reviewed  ECG    No new since 10/28 - Personally Reviewed  Physical Exam   GEN: No acute distress.   Neck: No JVD Cardiac: RRR, no murmurs, rubs, or gallops.  Respiratory: Clear to auscultation bilaterally. GI: Soft, nontender, non-distended  MS: No  edema; No deformity. Neuro:  Nonfocal  Psych: Normal affect   Labs    High Sensitivity Troponin:   Recent Labs  Lab 09/11/21 0833  TROPONINIHS 5     Chemistry Recent Labs  Lab 09/11/21 0833 09/11/21 1918 09/12/21 0435 09/13/21 0619 09/13/21 1254 09/14/21 0321  NA 140  --  141 138  --  136  K 3.1*  --  3.7 3.9  --  3.6  CL 105  --  100 99  --  97*  CO2 25  --  31 32  --  32  GLUCOSE 146*  --  112* 107*  --  90  BUN 7*  --  6* 12  --  20  CREATININE 0.95  --  0.95 1.05*  --  1.21*  CALCIUM 8.8*  --  10.1 9.5  --  9.4  MG  --    < > 1.9  --  2.1 1.9  PROT 6.7  --   --   --   --   --   ALBUMIN 3.4*  --   --   --   --   --   AST 34  --   --   --   --   --  ALT 15  --   --   --   --   --   ALKPHOS 65  --   --   --   --   --   BILITOT 0.4  --   --   --   --   --   GFRNONAA >60  --  >60 56*  --  47*  ANIONGAP 10  --  10 7  --  7   < > = values in this interval not displayed.    Lipids  Recent Labs  Lab 09/11/21 1918  CHOL 200  TRIG 80  HDL 77  LDLCALC 107*  CHOLHDL 2.6    Hematology Recent Labs  Lab 09/12/21 0435 09/12/21 1151 09/13/21 0619  WBC 3.7* 3.9* 6.1  RBC 4.71 4.96 4.67  HGB 13.4 14.1 13.4  HCT 42.8 45.3 42.8  MCV 90.9 91.3 91.6  MCH 28.5 28.4 28.7  MCHC 31.3 31.1 31.3  RDW 14.4 14.5 14.4  PLT 235 259 234   Thyroid  Recent Labs  Lab 09/11/21 1918 09/12/21 0435  TSH 0.300*  --   FREET4  --  1.13*    BNP Recent Labs  Lab 09/11/21 0833  BNP 968.0*    DDimer No results for input(s): DDIMER in the last 168 hours.   Radiology    No results found.  Cardiac Studies   Echo 09/12/21 1. Left ventricular ejection fraction, by estimation, is 25 to 30%. The  left ventricle has severely decreased function. The left ventricle  demonstrates global hypokinesis. There is mild left ventricular  hypertrophy. Left ventricular diastolic parameters   are consistent with Grade I diastolic dysfunction (impaired relaxation).   2. Right ventricular  systolic function is normal. The right ventricular  size is normal. Tricuspid regurgitation signal is inadequate for assessing  PA pressure.   3. The mitral valve is normal in structure. Trivial mitral valve  regurgitation. No evidence of mitral stenosis.   4. The aortic valve is tricuspid. Aortic valve regurgitation is not  visualized. Mild aortic valve sclerosis is present, with no evidence of  aortic valve stenosis.   5. The inferior vena cava is normal in size with greater than 50%  respiratory variability, suggesting right atrial pressure of 3 mmHg.   Comparison(s): No prior Echocardiogram.   Patient Profile     75 y.o. female with PMH PAD, Hypertension presenting with new decompensated systolic heart failure.  Assessment & Plan    Systolic and diastolic heart failure, acute -presented as NYHA class 3, stage C -see 09/12/21 note re: discussion of ischemic workup by Dr. Gasper Sells. Plan is to pursue ischemic evaluation as an outpatient once optimized -currently on IV furosemide 80 mg BID. She appears euvolemic today. Will change to oral lasix daily. -admission weight 50.3 kg, current weight 48.6 kg. Charted as net negative 1 L but not well documented. -tolerating metoprolol succinate 25 mg daily -Cr up slightly to 1.21 today, 0.95 on presentation. Suspect she is dry. Changing from 80 mg IV BID lasix as above. -if she remains euvolemic at outpatient follow up, would consider SLGT2i and then determine if she needs daily or PRN lasix.  PAD: -on clopidogrel and coumadin at home -appreciate pharmacy assistance with coumadin; supratherapeutic INR on presentation. INR 3.1 today -continue atorvastatin, would prefer this over home lovastatin if she is tolerating  Hypertension -has been more hypotensive, gradual titration of medication as above -was on home lisinopril and triamterene-HCTZ. Would discontinue triamterene-HCTZ and replace with furosemide  at discharge. Do not think she  will have the blood pressure room to start entresto. May be able to start low dose ARB if renal function and BP allow.  For questions or updates, please contact [place] Please consult [email or web address] for contact info under        Signed, Buford Dresser, MD  09/14/2021, 10:31 AM

## 2021-09-14 NOTE — Discharge Instructions (Addendum)
Ms. Dietzman, It was a pleasure taking care of you at [facility] were admitted for exacerbation of your heart failure.  We have taken off a lot of fluids so we are discharging you home now that you are doing better. Please follow the following instructions.  1) Follow-up with Dr. Daryll Drown on Friday for hospital follow-up and lab 2) Start taking Lasix 40 mg daily 3) We advise you to decrease amount of salt in your diet  Take care,  Dr. Linwood Dibbles, MD, MPH

## 2021-09-14 NOTE — Plan of Care (Signed)
  Problem: Education: Goal: Ability to demonstrate management of disease process will improve Outcome: Progressing Goal: Ability to verbalize understanding of medication therapies will improve Outcome: Progressing   

## 2021-09-14 NOTE — Discharge Summary (Signed)
Name: Kristin Knight MRN: [id number] DOB: 11-20-1945 75 y.o. PCP: Sid Falcon, MD  Date of Admission: 09/11/2021  8:22 AM Date of Discharge: 09/14/2021 Attending Physician: No att. 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 Severe Asymptomatic hypertension COPD   Discharge Medications: Allergies as of 09/14/2021       Reactions   Penicillins Anaphylaxis, Other (See Comments)   Patient passed out Has patient had a PCN reaction causing immediate rash, facial/tongue/throat swelling, SOB or lightheadedness with hypotension: No Has patient had a PCN reaction causing severe rash involving mucus membranes or skin necrosis: No Has patient had a PCN reaction that required hospitalization: No Has patient had a PCN reaction occurring within the last 10 years: No If all of the above answers are "NO", then may proceed with Cephalosporin use.   Meperidine Hcl Other (See Comments)   nevousness (Demerol)   Chantix [varenicline] Nausea And Vomiting        Medication List     STOP taking these medications    alendronate 70 MG tablet Commonly known as: Fosamax   calcium citrate-vitamin D 315-200 MG-UNIT tablet Commonly known as: CITRACAL+D   lisinopril 20 MG tablet Commonly known as: ZESTRIL   lovastatin 20 MG tablet Commonly known as: MEVACOR   triamterene-hydrochlorothiazide 37.5-25 MG tablet Commonly known as: MAXZIDE-25       TAKE these medications    albuterol 108 (90 Base) MCG/ACT inhaler Commonly known as: VENTOLIN HFA Inhale 2 puffs into the lungs every 6 (six) hours as needed for wheezing or shortness of breath.   atorvastatin 40 MG tablet Commonly known as: LIPITOR Take 1 tablet (40 mg total) by mouth daily. Start taking on: September 15, 2021   clopidogrel 75 MG tablet Commonly known as: PLAVIX Take 1 tablet (75 mg total) by mouth daily.    furosemide 40 MG tablet Commonly known as: LASIX Take 1 tablet (40 mg total) by mouth daily. Start taking on: September 15, 2021   metoprolol succinate 25 MG 24 hr tablet Commonly known as: TOPROL-XL Take 1 tablet (25 mg total) by mouth daily. Start taking on: September 15, 2021   mirtazapine 30 MG tablet Commonly known as: REMERON Take 1 tablet (30 mg total) by mouth at bedtime.   oxyCODONE-acetaminophen 7.5-325 MG tablet Commonly known as: PERCOCET Take 1 tablet by mouth every 8 (eight) hours as needed for severe pain.   warfarin 3 MG tablet Commonly known as: COUMADIN Take as directed. If you are unsure how to take this medication, talk to your nurse or doctor. Original instructions: Take 1 tablet (3 mg total) by mouth daily at 4 PM. What changed:  medication strength how much to take how to take this when to take this additional instructions        Disposition and follow-up:   Kristin Knight was discharged from [facility] in Stable condition.  At the hospital follow up visit please address:  New Dx of Heart failure with reduced EF (25-30%) NYHA Class III stag C Patient initiated on metoprolol succinate 25 mg qd and Furosemide 40 mg qd.  If blood pressure is able to tolerate considering adding Entresto. If she remains euvolemic and outpatient follow-up, consider adding SGLT2 inhibitor and then determine if she needs daily or as needed Lasix.   Hypokalemia Potassium of 3.1 during hospitalization following diuresis with furosemide.  This was repleted  daily and on day of discharge potassium at 3.6.    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 INR was supratherapeutic at 7.1 during hospitalization.   Severe Asymptomatic hypertension Medications included lisinopril and triamterene-HCTZ.  Following new diagnosis of HF with reduced EF, medications transitioned to medically optimized for heart failure.     1.  Labs / imaging needed at time of follow-up: BMP, INR  2.  Pending labs/ test needing follow-up: none  Follow-up Appointments:  Follow-up Information     Werner Lean, MD Follow up.   Specialty: Cardiology Why: Hospital follow-up with Cardiology scheduled for 10/19/2021 at 10:20am. Please arrive 15 minutes early for check-in. If this date/time does not work for you, please call our office to reschedule. Contact information: [address] [place] [address] [phone]         [place]. Go to.   Specialty: Cardiology Why: Thursday, November 10 @ 11AM for HV [facility] within [place].  Bring all your medications with you. FREE valet parking at [employer], off [address]. Contact information: [address] [id number] [place] Hospital Course by problem list:  New Dx of Heart failure with reduced EF (25-30%) NYHA Class III stag C Patient presented due to increasing lower extremity edema worse on left greater than right.  She stated that it extends beyond her knees but resolved at night while she was sleeping.  Her last 7 days she is also noticed progressively worsening dyspnea on exertion and orthopnea.  On arrival to the ED, patient was noted to be hypertensive at 131/90 with heart rate of 56.  She is breathing on 100% on room air with respiratory rate of 15.  She was afebrile at 97.5 Fahrenheit.  Initial lab work-up was remarkable for hypokalemia 3.1 with elevated glucose of 146.  CBC was unremarkable BNP was elevated at 968.  Troponin negative at 5.  INR elevated 2.8, patient is on chronic warfarin.  Chest x-ray was obtained that showed pulmonary edema with small pleural effusion.  Echo showed left ventricular ejection fraction of 25 to 30% with global hypokinesis.  Cardiology consulted following echo.  Because she did  not have anginal symptoms, decision was made to pursue ischemic work-up as outpatient and to optimize heart failure medical management as able while inpatient.  Patient was given IV Lasix for diuresis.  On day of discharge patient appeared to be hypovolemic with poor skin turgor after receiving IV Lasix.  She was able to ambulate without dizziness.  Patient was discharged on metoprolol succinate 25 mg and Furosemide 40 mg once daily.  Due to normotensive blood pressures, additional medical management therapy is not added while inpatient.  Admission weight 50.3 kg and discharge weight of 48.6 kg.    Hypokalemia Potassium of 3.1 during hospitalization following diuresis with furosemide.  This was repleted daily and on day of discharge potassium at 3.6.    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 INR was supratherapeutic at 7.1 during hospitalization.  Warfarin was appropriately held and on day of discharge INR was at 3.1.  Severe Asymptomatic hypertension Medications included lisinopril and triamterene-HCTZ.  Following new diagnosis of HF with reduced EF, medications transitioned to medically optimized for heart failure.  COPD PFT completed in 2009 with FEV1/FVC 0.72, FEV1 1.92 Gold stage 1.  No recent documentation of COPD exacerbation on albuterol at home.   Discharge Exam:   BP (!) 130/91 (BP Location: Left Arm)   Pulse 69   Temp 98 F (36.7 C) (Oral)   Resp 18   Ht 5\' 4"  (1.626 m)   Wt 48.6 kg   LMP 01/13/1974   SpO2 99%   BMI 18.38 kg/m  Discharge exam:  General: well-developed, well-nourished, in no scute distress HENT: NCAT, mucous membranes moist Eyes:No scleral icterus, conjunctiva clear CV: normal rate and rhythm Pulm: CTAB, normal pulmonary effort GI: Bowel sounds normal, no tenderness, no distension MSK: No edema to lower extremities bilaterally, normal bulk and tone Skin: poor skin turgor, warm and dry Psych: normal  mood and affect  Pertinent Labs, Studies, and Procedures:  CBC Latest Ref Rng & Units 09/13/2021 09/12/2021 09/12/2021  WBC 4.0 - 10.5 K/uL 6.1 3.9(L) 3.7(L)  Hemoglobin 12.0 - 15.0 g/dL 13.4 14.1 13.4  Hematocrit 36.0 - 46.0 % 42.8 45.3 42.8  Platelets 150 - 400 K/uL 234 259 235    BMP Latest Ref Rng & Units 09/14/2021 09/13/2021 09/12/2021  Glucose 70 - 99 mg/dL 90 107(H) 112(H)  BUN 8 - 23 mg/dL 20 12 6(L)  Creatinine 0.44 - 1.00 mg/dL 1.21(H) 1.05(H) 0.95  BUN/Creat Ratio 12 - 28 - - -  Sodium 135 - 145 mmol/L 136 138 141  Potassium 3.5 - 5.1 mmol/L 3.6 3.9 3.7  Chloride 98 - 111 mmol/L 97(L) 99 100  CO2 22 - 32 mmol/L 32 32 31  Calcium 8.9 - 10.3 mg/dL 9.4 9.5 10.1    CXR 10/28: IMPRESSION: Probable pulmonary edema with small pleural effusions and patchy bibasilar atelectasis. Early pneumonia difficult to exclude, especially within the lingula. Recommend radiographic follow-up.   Discharge Instructions: Discharge Instructions     Diet - low sodium heart healthy   Complete by: As directed    Increase activity slowly   Complete by: As directed       Kristin Knight, It was a pleasure taking care of you at [place] were admitted for exacerbation of your heart failure.  We have taken off a lot of fluids so we are discharging you home now that you are doing better. Please follow the following instructions.  1) Follow-up with Dr. Daryll Drown on Friday for hospital follow-up and lab 2) Start taking Lasix 40 mg daily 3) We advise you to decrease amount of salt in your diet  Take care,  Dr. Linwood Dibbles, MD, MPH  Signed: Christiana Fuchs, DO 09/14/2021, 6:32 PM   Pager: [phone]
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
## 2021-09-14 NOTE — Progress Notes (Signed)
   09/14/21 1029  Vitals  Temp 98 F (36.7 C)  Temp Source Oral  BP (!) 130/91  MAP (mmHg) 104  BP Location Left Arm  BP Method Automatic  Patient Position (if appropriate) Orthostatic Vitals  Pulse Rate 69  Pulse Rate Source Dinamap  Resp 18  MEWS COLOR  MEWS Score Color Green  Orthostatic Lying   BP- Lying (!) 130/91  Pulse- Lying 69  Orthostatic Sitting  BP- Sitting (!) 120/92  Pulse- Sitting 70  Orthostatic Standing at 0 minutes  BP- Standing at 0 minutes 118/86  Pulse- Standing at 0 minutes 79  Orthostatic Standing at 3 minutes  BP- Standing at 3 minutes 141/83  Pulse- Standing at 3 minutes 80  Oxygen Therapy  SpO2 99 %  O2 Device Room Air  MEWS Score  MEWS Temp 0  MEWS Systolic 0  MEWS Pulse 0  MEWS RR 0  MEWS LOC 0  MEWS Score 0

## 2021-09-14 NOTE — Progress Notes (Signed)
Mobility Specialist Progress Note:   09/14/21 1100  Mobility  Activity Ambulated in hall  Level of Assistance Standby assist, set-up cues, supervision of patient - no hands on  Assistive Device None  Distance Ambulated (ft) 380 ft  Mobility Ambulated with assistance in hallway  Mobility Response Tolerated well  Mobility performed by Mobility specialist  $Mobility charge 1 Mobility   Pt received in bed willing to participate in mobility. No complaints of pain, pt stated her legs felt weak but she says it d/t her laying in bed the past couple days. Pt returned to bed with call bell in reach and all needs met.   [facility] Health and safety inspector Phone [phone]

## 2021-09-16 ENCOUNTER — Telehealth: Payer: Self-pay

## 2021-09-16 NOTE — Telephone Encounter (Signed)
Transition Care Management Follow-up Telephone Call Date of discharge and from where: [facility] 09/11/21-09/14/21 How have you been since you were released from the hospital? Patient noted she feels good, a bit tired though. Any questions or concerns? No  Items Reviewed: Did the pt receive and understand the discharge instructions provided? Yes  Medications obtained and verified? Yes  Other? No  Any new allergies since your discharge? No  Dietary orders reviewed? Yes Do you have support at home? Yes   Home Care and Equipment/Supplies: Were home health services ordered? no If so, what is the name of the agency? N/a  Has the agency set up a time to come to the patient's home? not applicable Were any new equipment or medical supplies ordered?  No What is the name of the medical supply agency? N/a Were you able to get the supplies/equipment? not applicable Do you have any questions related to the use of the equipment or supplies? No  Functional Questionnaire: (I = Independent and D = Dependent) ADLs: I  Bathing/Dressing- I  Meal Prep- I  Eating- I  Maintaining continence- I  Transferring/Ambulation- I  Managing Meds- I  Follow up appointments reviewed:  PCP Hospital f/u appt confirmed? Yes  Scheduled to see Dr. Daryll Drown on 09/18/21 @ 0945. [place] [facility] f/u appt confirmed? Yes  Scheduled to see vascular on 09/24/21 @ 11am. Are transportation arrangements needed? No  If their condition worsens, is the pt aware to call PCP or go to the Emergency Dept.? Yes Was the patient provided with contact information for the PCP's office or ED? Yes Was to pt encouraged to call back with questions or concerns? Yes   Johnney Killian, RN, BSN, CCM Care Management Coordinator [facility] Internal Medicine Phone: [phone]: [phone]

## 2021-09-18 ENCOUNTER — Ambulatory Visit (INDEPENDENT_AMBULATORY_CARE_PROVIDER_SITE_OTHER): Payer: Medicare HMO | Admitting: Internal Medicine

## 2021-09-18 ENCOUNTER — Encounter: Payer: Self-pay | Admitting: Internal Medicine

## 2021-09-18 ENCOUNTER — Other Ambulatory Visit: Payer: Self-pay

## 2021-09-18 VITALS — BP 109/79 | HR 68 | Temp 98.1°F | Ht 64.0 in | Wt 105.6 lb

## 2021-09-18 DIAGNOSIS — M810 Age-related osteoporosis without current pathological fracture: Secondary | ICD-10-CM

## 2021-09-18 DIAGNOSIS — I5022 Chronic systolic (congestive) heart failure: Secondary | ICD-10-CM | POA: Diagnosis not present

## 2021-09-18 DIAGNOSIS — I10 Essential (primary) hypertension: Secondary | ICD-10-CM

## 2021-09-18 DIAGNOSIS — T82868S Thrombosis of vascular prosthetic devices, implants and grafts, sequela: Secondary | ICD-10-CM

## 2021-09-18 DIAGNOSIS — Z7901 Long term (current) use of anticoagulants: Secondary | ICD-10-CM | POA: Diagnosis not present

## 2021-09-18 DIAGNOSIS — I779 Disorder of arteries and arterioles, unspecified: Secondary | ICD-10-CM | POA: Diagnosis not present

## 2021-09-18 DIAGNOSIS — Z0000 Encounter for general adult medical examination without abnormal findings: Secondary | ICD-10-CM

## 2021-09-18 DIAGNOSIS — M545 Low back pain, unspecified: Secondary | ICD-10-CM

## 2021-09-18 DIAGNOSIS — I743 Embolism and thrombosis of arteries of the lower extremities: Secondary | ICD-10-CM

## 2021-09-18 DIAGNOSIS — T82898S Other specified complication of vascular prosthetic devices, implants and grafts, sequela: Secondary | ICD-10-CM

## 2021-09-18 DIAGNOSIS — R7989 Other specified abnormal findings of blood chemistry: Secondary | ICD-10-CM

## 2021-09-18 DIAGNOSIS — I872 Venous insufficiency (chronic) (peripheral): Secondary | ICD-10-CM

## 2021-09-18 DIAGNOSIS — F331 Major depressive disorder, recurrent, moderate: Secondary | ICD-10-CM

## 2021-09-18 DIAGNOSIS — G8929 Other chronic pain: Secondary | ICD-10-CM

## 2021-09-18 LAB — POCT INR: INR: 2.8 (ref 2.0–3.0)

## 2021-09-18 MED ORDER — ENTRESTO 49-51 MG PO TABS: 1.0000 | ORAL_TABLET | Freq: Two times a day (BID) | ORAL | 3 refills | Status: DC

## 2021-09-18 NOTE — Assessment & Plan Note (Signed)
She has been taking her coumadin as directed at 4pm.  Her INR today is 2.8 which is within goal for her.  She will need to be scheduled for INR follow up with Dr. Elie Confer.

## 2021-09-18 NOTE — Assessment & Plan Note (Signed)
She is doing okay in this area, notes that she has no appetite.  Mirtazapine does not seem to be helping in that arena, but mood is okay.  Given changes already made to medications, will hold on increasing today.  Next dose would be 45mg  daily.   Plan Increase at next visit if tolerating other medications.

## 2021-09-18 NOTE — Assessment & Plan Note (Signed)
She is doing well on current therapy of percocet.  She has no increased grogginess or other complications such as constipation.  She will continue on her current stable therapy.  PDMP reviewed and appropriate.   Continue percocet at current dsoe.

## 2021-09-18 NOTE — Assessment & Plan Note (Signed)
She notes that she has not had PAP smears since her hysterectomy in 1974.  She has had no return of bleeding or symptoms at this time.

## 2021-09-18 NOTE — Assessment & Plan Note (Signed)
She is doing well.  No SOB, DOE or swelling.  She is taking lasix and metoprolol.  She had a mix up with her discharge medications which we resolved.  We will start Entresto today and have her follow up with her cardiologist in 1 month.  Follow up TTE based on Cardiology recommendations.

## 2021-09-18 NOTE — Progress Notes (Signed)
Subjective:    Patient ID: Kristin Knight, female    DOB: Apr 30, 1946, 75 y.o.   MRN: [id number]  CC: Hospital follow up, new HFrEF  HPI  Kristin Knight is a 75 year old woman with PMH Of HTH, PAOD, chronic low back pain, MDD, osteoporosis who presents for follow up after hospitalization.  She was noted to have new onset HFrEF during that hospitalization.  She was seen by Cardiology and plan was noted to be for an ischemic work up outpatient.  She has a cardiology appointment on 12/5.    Today, Kristin Knight reports that she is doing well.  She has no complaints at this time.  She reports breathing well, no SOB or DOE.  She has no chest pain, palpitations or fatigue.  She has no swelling in the legs.  Back pain is well controlled on her current therapy of percocet.  She has been taking her coumadin for her PAOD and occlusion historically.  She reports going off of her plavix, however, discharge summary suggests that she should be on it.    She reports that she has continued to take her triamterine-hctz along with lasix and metoprolol.  Her BP is controlled today.     Review of Systems  Constitutional:  Negative for activity change, appetite change, chills and fatigue.  Respiratory:  Negative for cough, chest tightness, shortness of breath and wheezing.   Cardiovascular:  Positive for palpitations (occasional). Negative for chest pain and leg swelling.  Musculoskeletal:  Negative for arthralgias, back pain and gait problem.  Skin:  Negative for pallor and rash.  Neurological:  Negative for dizziness, weakness and headaches.  Psychiatric/Behavioral:  Negative for confusion and decreased concentration.       Objective:   Physical Exam Vitals and nursing note reviewed.  Constitutional:      General: She is not in acute distress.    Appearance: Normal appearance. She is not toxic-appearing.     Comments: Thin woman  HENT:     Head: Normocephalic and atraumatic.  Cardiovascular:     Rate and  Rhythm: Normal rate and regular rhythm.     Pulses: Normal pulses.     Heart sounds: No murmur heard. Pulmonary:     Effort: Pulmonary effort is normal. No respiratory distress.     Breath sounds: Normal breath sounds. No wheezing or rales.  Abdominal:     General: Bowel sounds are normal.     Palpations: Abdomen is soft.  Musculoskeletal:        General: No swelling, tenderness, deformity or signs of injury.     Cervical back: Neck supple. No rigidity.     Right lower leg: No edema.     Left lower leg: No edema.  Lymphadenopathy:     Cervical: No cervical adenopathy.  Skin:    General: Skin is warm and dry.     Coloration: Skin is not pale.     Findings: No rash.  Neurological:     General: No focal deficit present.     Mental Status: She is alert and oriented to person, place, and time.  Psychiatric:        Mood and Affect: Mood normal.        Behavior: Behavior normal.    BMET INR Will repeat TFTs as they were abnormal in the hospital.       Assessment & Plan:  Follow up with Cardiology in 1 month Follow up with me in 3-4 months.

## 2021-09-18 NOTE — Patient Instructions (Signed)
Kristin Knight - -  Very good to see you today!  Please STOP taking the triamterine-hctz  Please START taking Entresto twice a day for blood pressure and heart failure.  Please call the clinic if you have any of the side effects listed below.   Your discharging doctors wanted you stay on the Plavix (clopidogrel) so please restart taking this medication.   Come back to see me in 3-4 months to see how you are doing.   Gilles Chiquito, MD  Sacubitril; Valsartan Oral Tablets Delene Loll)  What is this medication? SACUBITRIL; VALSARTAN (sak UE bi tril; val SAR tan) is a combination of a neprilysin inhibitor and a an angiotensin II receptor blocker. It treats heart failure. This medicine may be used for other purposes; ask your health care provider or pharmacist if you have questions. COMMON BRAND NAME(S): Entresto What should I tell my care team before I take this medication? They need to know if you have any of these conditions: diabetes and take a medicine that contains aliskiren high levels of potassium in the blood kidney disease liver disease low blood pressure an unusual or allergic reaction to sacubitril; valsartan, drugs called angiotensin converting enzyme (ACE) inhibitors, angiotensin II receptor blockers (ARBs), other medicines, foods, dyes, or preservatives pregnant or trying to get pregnant breast-feeding How should I use this medication? Take this medicine by mouth. Take it as directed on the prescription label at the same time every day. You can take it with or without food. If it upsets your stomach, take it with food. Keep taking it unless your health care provider tells you to stop. Talk to your health care provider about the use of this drug in children. While it may be prescribed for children as young as 1 for selected conditions, precautions do apply. Overdosage: If you think you have taken too much of this medicine contact a poison control center or emergency room at  once. NOTE: This medicine is only for you. Do not share this medicine with others. What if I miss a dose? If you miss a dose, take it as soon as you can. If it is almost time for your next dose, take only that dose. Do not take double or extra doses. What may interact with this medication? Do not take this medicine with any of the following medicines: aliskiren if you have diabetes angiotensin-converting enzyme (ACE) inhibitors, like benazepril, captopril, enalapril, fosinopril, lisinopril, or ramipril tranylcypromine This medicine may also interact with the following medicines: angiotensin II receptor blockers (ARBs) like azilsartan, candesartan, eprosartan, irbesartan, losartan, olmesartan, telmisartan, or valsartan celecoxib lithium NSAIDS, medicines for pain and inflammation, like ibuprofen or naproxen potassium-sparing diuretics like amiloride, spironolactone, and triamterene potassium supplements This list may not describe all possible interactions. Give your health care provider a list of all the medicines, herbs, non-prescription drugs, or dietary supplements you use. Also tell them if you smoke, drink alcohol, or use illegal drugs. Some items may interact with your medicine. What should I watch for while using this medication? Tell your doctor or health care provider if your symptoms do not start to get better or if they get worse. Do not become pregnant while taking this medicine. Women should inform their health care provider if they wish to become pregnant or think they might be pregnant. There is a potential for serious harm to an unborn child. Talk to your health care provider for more information. You may get drowsy or dizzy. Do not drive, use machinery, or do  anything that needs mental alertness until you know how this medicine affects you. Do not stand or sit up quickly, especially if you are an older patient. This reduces the risk of dizzy or fainting spells. Alcohol may  interfere with the effects of this medicine. Avoid alcoholic drinks. Avoid salt substitutes unless you are told otherwise by your health care provider. What side effects may I notice from receiving this medication? Side effects that you should report to your doctor or health care provider as soon as possible: allergic reactions (skin rash, itching or hives; swelling of the face, lips, or tongue) high potassium levels (chest pain; fast, irregular heartbeat; muscle weakness) kidney injury (trouble passing urine or change in the amount of urine) low blood pressure (dizziness; feeling faint or lightheaded, falls; unusually weak or tired) Side effects that usually do not require medical attention (report to your doctor or health care provider if they continue or are bothersome): cough This list may not describe all possible side effects. Call your doctor for medical advice about side effects. You may report side effects to FDA at 1-800-FDA-1088. Where should I keep my medication? Keep out of the reach of children and pets. Store at room temperature between 20 and 25 degrees C (68 and 77 degrees F). Protect from moisture. Keep the container tightly closed. Get rid of any unused medicine after the expiration date. To get rid of medicines that are no longer needed or have expired: Take the medicine to a take-back program. Check with your pharmacy or law enforcement to find a location. If you cannot return the medicine, check the label or package insert to see if the medicine should be thrown out in the garbage or flushed down the toilet. If you are not sure, ask your health care provider. If it is safe to put it in the trash, empty the medicine out of the container. Mix the medicine with cat litter, dirt, coffee grounds, or other unwanted substance. Seal the mixture in a bag or container. Put it in the trash. NOTE: This sheet is a summary. It may not cover all possible information. If you have questions  about this medicine, talk to your doctor, pharmacist, or health care provider.  2022 Elsevier/Gold Standard (2021-07-21 00:00:00)

## 2021-09-18 NOTE — Assessment & Plan Note (Signed)
INR improved to goal today.  Follow up in [facility] clinic as scheduled.

## 2021-09-18 NOTE — Assessment & Plan Note (Signed)
She noted being told to stop her plavix, however, her discharge summary notes that she should continue to be on it.  She has no pain or other signs of acute arterial insufficiency today.  I advised her to start taking it again, but if she would rather wait and speak to Cardiology, she should be okay to do so.   Plan Continue plavix.

## 2021-09-18 NOTE — Assessment & Plan Note (Signed)
BP today was well controlled at 109/79.  On discussion with the patient, she is taking metoprolol and lasix, but is also still taking her triamterine-hctz by mistake.  I have discussed with her and she will stop this medication.  We will start instead Entresto 49-51 BID.  She has been off of ACE-I since discharge 4 days ago and reports that she has not taken any of this medication at home.  She will follow up with Cardiology in 1 month and then with me in 3 months.   Plan Continue beta blocker, lasix - Start entresto 49-51 BID.  She will call with any issues.

## 2021-09-18 NOTE — Assessment & Plan Note (Signed)
She is doing well in this aspect.  She has no swelling or pain in the legs today.

## 2021-09-18 NOTE — Assessment & Plan Note (Signed)
She is currently not on fosamax.  We will discuss restarting at next visit.

## 2021-09-19 LAB — T3: T3, Total: 129 ng/dL (ref 71–180)

## 2021-09-19 LAB — BMP8+ANION GAP
Anion Gap: 15 mmol/L (ref 10.0–18.0)
BUN/Creatinine Ratio: 23 (ref 12–28)
BUN: 31 mg/dL — ABNORMAL HIGH (ref 8–27)
CO2: 30 mmol/L — ABNORMAL HIGH (ref 20–29)
Calcium: 10.3 mg/dL (ref 8.7–10.3)
Chloride: 89 mmol/L — ABNORMAL LOW (ref 96–106)
Creatinine, Ser: 1.32 mg/dL — ABNORMAL HIGH (ref 0.57–1.00)
Glucose: 117 mg/dL — ABNORMAL HIGH (ref 70–99)
Potassium: 3.2 mmol/L — ABNORMAL LOW (ref 3.5–5.2)
Sodium: 134 mmol/L (ref 134–144)
eGFR: 42 mL/min/{1.73_m2} — ABNORMAL LOW (ref >59)

## 2021-09-19 LAB — TSH: TSH: 0.532 u[IU]/mL (ref 0.450–4.500)

## 2021-09-19 LAB — T4, FREE: Free T4: 1.65 ng/dL (ref 0.82–1.77)

## 2021-09-21 ENCOUNTER — Ambulatory Visit (INDEPENDENT_AMBULATORY_CARE_PROVIDER_SITE_OTHER): Payer: Medicare HMO | Admitting: Pharmacist

## 2021-09-21 DIAGNOSIS — Z7901 Long term (current) use of anticoagulants: Secondary | ICD-10-CM | POA: Diagnosis not present

## 2021-09-21 DIAGNOSIS — I779 Disorder of arteries and arterioles, unspecified: Secondary | ICD-10-CM | POA: Diagnosis not present

## 2021-09-21 DIAGNOSIS — T82868S Thrombosis of vascular prosthetic devices, implants and grafts, sequela: Secondary | ICD-10-CM | POA: Diagnosis not present

## 2021-09-21 LAB — POCT INR: INR: 2.9 (ref 2.0–3.0)

## 2021-09-21 NOTE — Patient Instructions (Signed)
Patient instructed to take medications as defined in the Anti-coagulation Track section of this encounter.  Patient instructed to take today's dose.  Patient instructed to take one (1) of your 4mg  blue colored warfarin tablets, once-daily at 6PM--EXCEPT on [facility], take 1&1/2 tablets on [facility].  Patient verbalized understanding of these instructions.
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
## 2021-09-23 ENCOUNTER — Telehealth (HOSPITAL_COMMUNITY): Payer: Self-pay

## 2021-09-23 NOTE — Telephone Encounter (Signed)
Call attempted to confirm HV TOC appt 11/10 @ 11AM. HIPPA appropriate VM left with callback number.

## 2021-09-24 ENCOUNTER — Encounter (HOSPITAL_COMMUNITY): Payer: Medicare HMO

## 2021-09-24 ENCOUNTER — Other Ambulatory Visit (HOSPITAL_COMMUNITY): Payer: Self-pay

## 2021-09-25 ENCOUNTER — Telehealth: Payer: Self-pay | Admitting: Internal Medicine

## 2021-09-25 MED ORDER — POTASSIUM CHLORIDE CRYS ER 20 MEQ PO TBCR: 20.0000 meq | EXTENDED_RELEASE_TABLET | Freq: Every day | ORAL | 0 refills | Status: DC

## 2021-09-25 NOTE — Progress Notes (Signed)
Evaluation and management procedures were performed by the Clinical Pharmacy Practitioner under my supervision and collaboration. I have reviewed the Practitioner's note and chart, and I agree with the management and plan as documented above. ° °

## 2021-09-25 NOTE — Telephone Encounter (Signed)
Called Kristin Knight regarding her blood work.  ID confirmed by name and DOB.   She notes that she has started taking her entresto.  She has no issues, no dizziness, weakness, lightheadedness, chest pain.   Her K was mildly low at last check and her Cr was a little higher.  She was still taking her triamterine-hctz at that time.    Plan Kdur 20 meq X 3 days She will follow up with Cardiology team on Monday 11/14 If no blood work done at that time, will plan to have her repeat blood work here.   Patient expressed understanding of plan  Gilles Chiquito, MD

## 2021-09-28 ENCOUNTER — Encounter (HOSPITAL_COMMUNITY): Payer: Self-pay

## 2021-09-28 ENCOUNTER — Other Ambulatory Visit: Payer: Self-pay

## 2021-09-28 ENCOUNTER — Telehealth (HOSPITAL_COMMUNITY): Payer: Self-pay

## 2021-09-28 ENCOUNTER — Ambulatory Visit (HOSPITAL_COMMUNITY)
Admission: RE | Admit: 2021-09-28 | Discharge: 2021-09-28 | Disposition: A | Discharge status: Home / Self Care | Payer: Medicare HMO | Source: Ambulatory Visit | Attending: Adult Health | Admitting: Adult Health

## 2021-09-28 VITALS — BP 150/96 | HR 55 | Ht 64.0 in | Wt 110.4 lb

## 2021-09-28 DIAGNOSIS — Z87891 Personal history of nicotine dependence: Secondary | ICD-10-CM | POA: Insufficient documentation

## 2021-09-28 DIAGNOSIS — Z7901 Long term (current) use of anticoagulants: Secondary | ICD-10-CM | POA: Insufficient documentation

## 2021-09-28 DIAGNOSIS — I5022 Chronic systolic (congestive) heart failure: Secondary | ICD-10-CM | POA: Diagnosis present

## 2021-09-28 DIAGNOSIS — Z596 Low income: Secondary | ICD-10-CM | POA: Insufficient documentation

## 2021-09-28 DIAGNOSIS — I158 Other secondary hypertension: Secondary | ICD-10-CM

## 2021-09-28 DIAGNOSIS — J449 Chronic obstructive pulmonary disease, unspecified: Secondary | ICD-10-CM | POA: Insufficient documentation

## 2021-09-28 DIAGNOSIS — I110 Hypertensive heart disease with heart failure: Secondary | ICD-10-CM | POA: Diagnosis not present

## 2021-09-28 DIAGNOSIS — I739 Peripheral vascular disease, unspecified: Secondary | ICD-10-CM | POA: Insufficient documentation

## 2021-09-28 DIAGNOSIS — I779 Disorder of arteries and arterioles, unspecified: Secondary | ICD-10-CM | POA: Diagnosis not present

## 2021-09-28 LAB — PROTIME-INR
INR: 3.3 — ABNORMAL HIGH (ref 0.8–1.2)
Prothrombin Time: 33.8 seconds — ABNORMAL HIGH (ref 11.4–15.2)

## 2021-09-28 LAB — BASIC METABOLIC PANEL
Anion gap: 8 (ref 5–15)
BUN: 13 mg/dL (ref 8–23)
CO2: 32 mmol/L (ref 22–32)
Calcium: 9.3 mg/dL (ref 8.9–10.3)
Chloride: 99 mmol/L (ref 98–111)
Creatinine, Ser: 1.03 mg/dL — ABNORMAL HIGH (ref 0.44–1.00)
GFR, Estimated: 57 mL/min — ABNORMAL LOW (ref >60)
Glucose, Bld: 98 mg/dL (ref 70–99)
Potassium: 3.7 mmol/L (ref 3.5–5.1)
Sodium: 139 mmol/L (ref 135–145)

## 2021-09-28 LAB — CBC
HCT: 42 % (ref 36.0–46.0)
Hemoglobin: 12.6 g/dL (ref 12.0–15.0)
MCH: 27.9 pg (ref 26.0–34.0)
MCHC: 30 g/dL (ref 30.0–36.0)
MCV: 93.1 fL (ref 80.0–100.0)
Platelets: 249 10*3/uL (ref 150–400)
RBC: 4.51 MIL/uL (ref 3.87–5.11)
RDW: 13.8 % (ref 11.5–15.5)
WBC: 5.1 10*3/uL (ref 4.0–10.5)
nRBC: 0 % (ref 0.0–0.2)

## 2021-09-28 MED ORDER — EMPAGLIFLOZIN 10 MG PO TABS: 10.0000 mg | ORAL_TABLET | Freq: Every day | ORAL | 11 refills | Status: DC
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
## 2021-09-28 NOTE — Patient Instructions (Signed)
START Jardiance 10 mg, one tab daily  Labs today We will only contact you if something comes back abnormal or we need to make some changes. Otherwise no news is good news!  Your physician has requested that you have a cardiac catheterization. Cardiac catheterization is used to diagnose and/or treat various heart conditions. Doctors may recommend this procedure for a number of different reasons. The most common reason is to evaluate chest pain. Chest pain can be a symptom of coronary artery disease (CAD), and cardiac catheterization can show whether plaque is narrowing or blocking your heart's arteries. This procedure is also used to evaluate the valves, as well as measure the blood flow and oxygen levels in different parts of your heart. For further information please visit [email or web address]. Please follow instruction sheet, as given.   Your physician recommends that you schedule a follow-up appointment in: 8 weeks with Dr Kendall Flack are scheduled for a Cardiac Catheterization on Monday, November 21 with Dr. Loralie Champagne.  1. Please arrive at the [facility] (Main Entrance A) at [facility]: [address] [place],  [address] at 5:30 AM (This time is two hours before your procedure to ensure your preparation). Free valet parking service is available.   Special note: Every effort is made to have your procedure done on time. Please understand that emergencies sometimes delay scheduled procedures.  2. Diet: Do not eat solid foods after midnight.  The patient may have clear liquids until 5am upon the day of the procedure.  3. Labs: Pre procedure labs done 09/28/2021  4. Medication instructions in preparation for your procedure:   Contrast Allergy: No   Stop taking Coumadin (Warfarin) on Wednesday, November 16.  Stop taking, Lasix (Furosemide)  Monday, November 21,   On the morning of your procedure, take your Aspirin and any morning medicines NOT listed above.  You  may use sips of water.  5. Plan for one night stay--bring personal belongings. 6. Bring a current list of your medications and current insurance cards. 7. You MUST have a responsible person to drive you home. 8. Someone MUST be with you the first 24 hours after you arrive home or your discharge will be delayed. 9. Please wear clothes that are easy to get on and off and wear slip-on shoes.  Thank you for allowing [place] to care for you!   -- [place] [facility]
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
## 2021-09-29 ENCOUNTER — Telehealth (HOSPITAL_COMMUNITY): Payer: Self-pay | Admitting: Licensed Clinical Social Worker
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
## 2021-09-30 ENCOUNTER — Telehealth (HOSPITAL_COMMUNITY): Payer: Self-pay | Admitting: Licensed Clinical Social Worker
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
## 2021-09-30 NOTE — Telephone Encounter (Signed)
CSW attempted to contact pt this morning and again this afternoon to follow up regarding her financial concerns with paying for natural gas bill and trying to get this turned back on.  Unable to reach- left VM requesting return call  Jorge Ny, [facility] Desk#: [phone] Cell#: [phone]

## 2021-10-05 ENCOUNTER — Other Ambulatory Visit: Payer: Self-pay

## 2021-10-05 ENCOUNTER — Ambulatory Visit (HOSPITAL_COMMUNITY)
Admission: RE | Admit: 2021-10-05 | Discharge: 2021-10-05 | Disposition: A | Discharge status: Home / Self Care | Payer: Medicare HMO | Source: Ambulatory Visit | Attending: Cardiology | Admitting: Cardiology

## 2021-10-05 ENCOUNTER — Encounter (HOSPITAL_COMMUNITY): Payer: Self-pay | Admitting: Cardiology

## 2021-10-05 ENCOUNTER — Ambulatory Visit (HOSPITAL_COMMUNITY)
Admission: RE | Disposition: A | Discharge status: Home / Self Care | Payer: Self-pay | Source: Ambulatory Visit | Attending: Cardiology

## 2021-10-05 DIAGNOSIS — I2510 Atherosclerotic heart disease of native coronary artery without angina pectoris: Secondary | ICD-10-CM | POA: Insufficient documentation

## 2021-10-05 DIAGNOSIS — Z87891 Personal history of nicotine dependence: Secondary | ICD-10-CM | POA: Insufficient documentation

## 2021-10-05 DIAGNOSIS — Z79899 Other long term (current) drug therapy: Secondary | ICD-10-CM | POA: Diagnosis not present

## 2021-10-05 DIAGNOSIS — I5020 Unspecified systolic (congestive) heart failure: Secondary | ICD-10-CM | POA: Insufficient documentation

## 2021-10-05 DIAGNOSIS — I110 Hypertensive heart disease with heart failure: Secondary | ICD-10-CM | POA: Diagnosis not present

## 2021-10-05 DIAGNOSIS — I428 Other cardiomyopathies: Secondary | ICD-10-CM | POA: Insufficient documentation

## 2021-10-05 DIAGNOSIS — I429 Cardiomyopathy, unspecified: Secondary | ICD-10-CM | POA: Diagnosis not present

## 2021-10-05 DIAGNOSIS — Z7901 Long term (current) use of anticoagulants: Secondary | ICD-10-CM | POA: Diagnosis not present

## 2021-10-05 DIAGNOSIS — I739 Peripheral vascular disease, unspecified: Secondary | ICD-10-CM | POA: Insufficient documentation

## 2021-10-05 HISTORY — PX: LEFT HEART CATH AND CORONARY ANGIOGRAPHY: CATH118249

## 2021-10-05 LAB — PROTIME-INR
INR: 1.2 (ref 0.8–1.2)
Prothrombin Time: 15.6 seconds — ABNORMAL HIGH (ref 11.4–15.2)

## 2021-10-05 LAB — GLUCOSE, CAPILLARY: Glucose-Capillary: 96 mg/dL (ref 70–99)

## 2021-10-05 SURGERY — LEFT HEART CATH AND CORONARY ANGIOGRAPHY
Anesthesia: LOCAL

## 2021-10-05 MED ORDER — IOHEXOL 350 MG/ML SOLN
INTRAVENOUS | Status: DC | PRN
  Administered 2021-10-05: 45 mL

## 2021-10-05 MED ORDER — SODIUM CHLORIDE 0.9 % IV SOLN: INTRAVENOUS | Status: DC

## 2021-10-05 MED ORDER — LABETALOL HCL 5 MG/ML IV SOLN: 10.0000 mg | INTRAVENOUS | Status: DC | PRN

## 2021-10-05 MED ORDER — VERAPAMIL HCL 2.5 MG/ML IV SOLN
INTRAVENOUS | Status: AC
  Filled 2021-10-05: qty 2

## 2021-10-05 MED ORDER — ACETAMINOPHEN 325 MG PO TABS: 650.0000 mg | ORAL_TABLET | ORAL | Status: DC | PRN

## 2021-10-05 MED ORDER — HEPARIN SODIUM (PORCINE) 1000 UNIT/ML IJ SOLN
INTRAMUSCULAR | Status: DC | PRN
  Administered 2021-10-05: 2500 [IU] via INTRAVENOUS

## 2021-10-05 MED ORDER — FENTANYL CITRATE (PF) 100 MCG/2ML IJ SOLN
INTRAMUSCULAR | Status: DC | PRN
  Administered 2021-10-05 (×2): 25 ug via INTRAVENOUS

## 2021-10-05 MED ORDER — HEPARIN SODIUM (PORCINE) 1000 UNIT/ML IJ SOLN
INTRAMUSCULAR | Status: AC
  Filled 2021-10-05: qty 1

## 2021-10-05 MED ORDER — SODIUM CHLORIDE 0.9% FLUSH: 3.0000 mL | INTRAVENOUS | Status: DC | PRN

## 2021-10-05 MED ORDER — ASPIRIN 81 MG PO CHEW: 81.0000 mg | CHEWABLE_TABLET | ORAL | Status: DC

## 2021-10-05 MED ORDER — MIDAZOLAM HCL 2 MG/2ML IJ SOLN
INTRAMUSCULAR | Status: AC
  Filled 2021-10-05: qty 2

## 2021-10-05 MED ORDER — SODIUM CHLORIDE 0.9 % WEIGHT BASED INFUSION: 1.0000 mL/kg/h | INTRAVENOUS | Status: DC

## 2021-10-05 MED ORDER — LIDOCAINE HCL (PF) 1 % IJ SOLN
INTRAMUSCULAR | Status: AC
  Filled 2021-10-05: qty 30

## 2021-10-05 MED ORDER — LIDOCAINE HCL (PF) 1 % IJ SOLN
INTRAMUSCULAR | Status: DC | PRN
  Administered 2021-10-05: 2 mL

## 2021-10-05 MED ORDER — MIDAZOLAM HCL 2 MG/2ML IJ SOLN
INTRAMUSCULAR | Status: DC | PRN
  Administered 2021-10-05 (×2): 1 mg via INTRAVENOUS

## 2021-10-05 MED ORDER — SODIUM CHLORIDE 0.9% FLUSH: 3.0000 mL | Freq: Two times a day (BID) | INTRAVENOUS | Status: DC

## 2021-10-05 MED ORDER — ONDANSETRON HCL 4 MG/2ML IJ SOLN: 4.0000 mg | Freq: Four times a day (QID) | INTRAMUSCULAR | Status: DC | PRN

## 2021-10-05 MED ORDER — HEPARIN (PORCINE) IN NACL 1000-0.9 UT/500ML-% IV SOLN
INTRAVENOUS | Status: AC
  Filled 2021-10-05: qty 500

## 2021-10-05 MED ORDER — SODIUM CHLORIDE 0.9 % IV SOLN: 250.0000 mL | INTRAVENOUS | Status: DC | PRN

## 2021-10-05 MED ORDER — FENTANYL CITRATE (PF) 100 MCG/2ML IJ SOLN
INTRAMUSCULAR | Status: AC
  Filled 2021-10-05: qty 2

## 2021-10-05 MED ORDER — HYDRALAZINE HCL 20 MG/ML IJ SOLN: 10.0000 mg | INTRAMUSCULAR | Status: DC | PRN

## 2021-10-05 MED ORDER — HEPARIN (PORCINE) IN NACL 1000-0.9 UT/500ML-% IV SOLN
INTRAVENOUS | Status: DC | PRN
  Administered 2021-10-05 (×2): 500 mL

## 2021-10-05 SURGICAL SUPPLY — 10 items
CATH 5FR JL3.5 JR4 ANG PIG MP (CATHETERS) ×2 IMPLANT
DEVICE RAD TR BAND REGULAR (VASCULAR PRODUCTS) ×2 IMPLANT
GLIDESHEATH SLEND SS 6F .021 (SHEATH) ×2 IMPLANT
GUIDEWIRE INQWIRE 1.5J.035X260 (WIRE) ×1 IMPLANT
INQWIRE 1.5J .035X260CM (WIRE) ×2
KIT HEART LEFT (KITS) ×2 IMPLANT
PACK CARDIAC CATHETERIZATION (CUSTOM PROCEDURE TRAY) ×2 IMPLANT
TRANSDUCER W/STOPCOCK (MISCELLANEOUS) ×2 IMPLANT
TUBING CIL FLEX 10 FLL-RA (TUBING) ×2 IMPLANT
WIRE HI TORQ VERSACORE-J 145CM (WIRE) ×2 IMPLANT

## 2021-10-05 NOTE — Interval H&P Note (Signed)
History and Physical Interval Note:  10/05/2021 7:52 AM  Kristin Knight  has presented today for surgery, with the diagnosis of heart failure.  The various methods of treatment have been discussed with the patient and family. After consideration of risks, benefits and other options for treatment, the patient has consented to  Procedure(s): LEFT HEART CATH AND CORONARY ANGIOGRAPHY (N/A) as a surgical intervention.  The patient's history has been reviewed, patient examined, no change in status, stable for surgery.  I have reviewed the patient's chart and labs.  Questions were answered to the patient's satisfaction.     Ginger Leeth [employer]

## 2021-10-05 NOTE — Progress Notes (Signed)
Discharge instructions reviewed with pt and her daughter. Both voice understanding.  

## 2021-10-05 NOTE — Progress Notes (Signed)
Arm board applied to right arm

## 2021-10-05 NOTE — Discharge Instructions (Signed)
Radial Site Care  This sheet gives you information about how to care for yourself after your procedure. Your health care provider may also give you more specific instructions. If you have problems or questions, contact your health care provider. What can I expect after the procedure? After the procedure, it is common to have: Bruising and tenderness at the catheter insertion area. Follow these instructions at home: Medicines Take over-the-counter and prescription medicines only as told by your health care provider. Insertion site care Follow instructions from your health care provider about how to take care of your insertion site. Make sure you: Wash your hands with soap and water before you remove your bandage (dressing). If soap and water are not available, use hand sanitizer. May remove dressing in 24 hours. Check your insertion site every day for signs of infection. Check for: Redness, swelling, or pain. Fluid or blood. Pus or a bad smell. Warmth. Do no take baths, swim, or use a hot tub for 5 days. You may shower 24-48 hours after the procedure. Remove the dressing and gently wash the site with plain soap and water. Pat the area dry with a clean towel. Do not rub the site. That could cause bleeding. Do not apply powder or lotion to the site. Activity  For 24 hours after the procedure, or as directed by your health care provider: Do not flex or bend the affected arm. Do not push or pull heavy objects with the affected arm. Do not drive yourself home from the hospital or clinic. You may drive 24 hours after the procedure. Do not operate machinery or power tools. KEEP ARM ELEVATED THE REMAINDER OF THE DAY. Do not push, pull or lift anything that is heavier than 10 lb for 5 days. Ask your health care provider when it is okay to: Return to work or school. Resume usual physical activities or sports. Resume sexual activity. General instructions If the catheter site starts to  bleed, raise your arm and put firm pressure on the site. If the bleeding does not stop, get help right away. This is a medical emergency. DRINK PLENTY OF FLUIDS FOR THE NEXT 2-3 DAYS. No alcohol consumption for 24 hours after receiving sedation. If you went home on the same day as your procedure, a responsible adult should be with you for the first 24 hours after you arrive home. Keep all follow-up visits as told by your health care provider. This is important. Contact a health care provider if: You have a fever. You have redness, swelling, or yellow drainage around your insertion site. Get help right away if: You have unusual pain at the radial site. The catheter insertion area swells very fast. The insertion area is bleeding, and the bleeding does not stop when you hold steady pressure on the area. Your arm or hand becomes pale, cool, tingly, or numb. These symptoms may represent a serious problem that is an emergency. Do not wait to see if the symptoms will go away. Get medical help right away. Call your local emergency services (911 in the [place] Do not drive yourself to the hospital. Summary After the procedure, it is common to have bruising and tenderness at the site. Follow instructions from your health care provider about how to take care of your radial site wound. Check the wound every day for signs of infection.  This information is not intended to replace advice given to you by your health care provider. Make sure you discuss any questions you have with   your health care provider. Document Revised: 12/07/2017 Document Reviewed: 12/07/2017 Elsevier Patient Education  2020 Elsevier Inc.  

## 2021-10-06 MED FILL — Verapamil HCl IV Soln 2.5 MG/ML: INTRAVENOUS | Qty: 2 | Status: AC

## 2021-10-12 ENCOUNTER — Encounter (HOSPITAL_COMMUNITY): Payer: Self-pay | Admitting: Radiology

## 2021-10-13 ENCOUNTER — Other Ambulatory Visit: Payer: Self-pay

## 2021-10-13 DIAGNOSIS — M545 Low back pain, unspecified: Secondary | ICD-10-CM

## 2021-10-13 DIAGNOSIS — G8929 Other chronic pain: Secondary | ICD-10-CM

## 2021-10-13 NOTE — Telephone Encounter (Signed)
oxyCODONE-acetaminophen (PERCOCET) 7.5-325 MG tablet, REFILL REQUEST @ [place] ([place]), [place] - [address]

## 2021-10-14 MED ORDER — OXYCODONE-ACETAMINOPHEN 7.5-325 MG PO TABS: 1.0000 | ORAL_TABLET | Freq: Three times a day (TID) | ORAL | 0 refills | Status: DC | PRN
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
## 2021-10-19 ENCOUNTER — Ambulatory Visit: Payer: Medicare HMO | Admitting: Internal Medicine

## 2021-10-19 ENCOUNTER — Encounter: Payer: Self-pay | Admitting: Internal Medicine

## 2021-10-19 ENCOUNTER — Other Ambulatory Visit: Payer: Self-pay | Admitting: Pharmacist

## 2021-10-19 ENCOUNTER — Other Ambulatory Visit: Payer: Self-pay

## 2021-10-19 VITALS — BP 120/68 | HR 60 | Ht 64.0 in | Wt 113.6 lb

## 2021-10-19 DIAGNOSIS — I5020 Unspecified systolic (congestive) heart failure: Secondary | ICD-10-CM

## 2021-10-19 DIAGNOSIS — I779 Disorder of arteries and arterioles, unspecified: Secondary | ICD-10-CM | POA: Diagnosis not present

## 2021-10-19 MED ORDER — WARFARIN SODIUM 4 MG PO TABS: 4.0000 mg | ORAL_TABLET | Freq: Every day | ORAL | 2 refills | Status: DC

## 2021-10-19 MED ORDER — SPIRONOLACTONE 25 MG PO TABS: 25.0000 mg | ORAL_TABLET | Freq: Every day | ORAL | 3 refills | Status: DC

## 2021-10-19 NOTE — Patient Instructions (Signed)
Medication Instructions:  Your physician has recommended you make the following change in your medication:  START: spironolactone (Aldactone) 25 mg by mouth daily  *If you need a refill on your cardiac medications before your next appointment, please call your pharmacy*   Lab Work: IN 7-10 Days: BNP, BMP, Fasting lipid panel and ALT If you have labs (blood work) drawn today and your tests are completely normal, you will receive your results only by: [place] (if you have MyChart) OR A paper copy in the mail If you have any lab test that is abnormal or we need to change your treatment, we will call you to review the results.   Testing/Procedures: Your physician has requested that you have an echocardiogram in 3 months. Echocardiography is a painless test that uses sound waves to create images of your heart. It provides your doctor with information about the size and shape of your heart and how well your heart's chambers and valves are working. This procedure takes approximately one hour. There are no restrictions for this procedure.    Follow-Up: At [facility], you and your health needs are our priority.  As part of our continuing mission to provide you with exceptional heart care, we have created designated Provider Care Teams.  These Care Teams include your primary Cardiologist (physician) and Advanced Practice Providers (APPs -  Physician Assistants and Nurse Practitioners) who all work together to provide you with the care you need, when you need it.  We recommend signing up for the patient portal called "MyChart".  Sign up information is provided on this After Visit Summary.  MyChart is used to connect with patients for Virtual Visits (Telemedicine).  Patients are able to view lab/test results, encounter notes, upcoming appointments, etc.  Non-urgent messages can be sent to your provider as well.   To learn more about what you can do with MyChart, go to [email or web address].     Your next appointment:   3-4 month(s)  The format for your next appointment:   In Person  Provider:   Werner Lean, MD

## 2021-10-20 ENCOUNTER — Other Ambulatory Visit: Payer: Self-pay | Admitting: Internal Medicine

## 2021-10-26 ENCOUNTER — Ambulatory Visit: Payer: Medicare HMO

## 2021-10-29 ENCOUNTER — Other Ambulatory Visit: Payer: Medicare HMO

## 2021-11-04 ENCOUNTER — Other Ambulatory Visit: Payer: Self-pay | Admitting: Internal Medicine

## 2021-11-04 DIAGNOSIS — I5022 Chronic systolic (congestive) heart failure: Secondary | ICD-10-CM

## 2021-11-11 ENCOUNTER — Telehealth: Payer: Self-pay | Admitting: *Deleted

## 2021-11-11 NOTE — Chronic Care Management (AMB) (Signed)
°  Care Management   Note  11/11/2021 Name: [name] MRN: [id number] DOB: 09-17-1946  Kristin Knight is a 75 y.o. year old female who is a primary care patient of Sid Falcon, MD. I reached out to [name] by phone today in response to a referral sent by Ms. Soyla Murphy Fager's primary care provider.   Ms. Bowley was given information about care management services today including:  Care management services include personalized support from designated clinical staff supervised by her physician, including individualized plan of care and coordination with other care providers 24/7 contact phone numbers for assistance for urgent and routine care needs. The patient may stop care management services at any time by phone call to the office staff.  Patient agreed to services and verbal consent obtained.   Follow up plan: Telephone appointment with care management team member scheduled for:11/26/21  [place] [facility]  Direct Dial: [phone]

## 2021-11-17 ENCOUNTER — Other Ambulatory Visit: Payer: Self-pay

## 2021-11-17 DIAGNOSIS — M545 Low back pain, unspecified: Secondary | ICD-10-CM

## 2021-11-17 NOTE — Telephone Encounter (Signed)
oxyCODONE-acetaminophen (PERCOCET) 7.5-325 MG tablet, refill request @ [facility] - [place] ([place]), [place] - [address] 

## 2021-11-17 NOTE — Telephone Encounter (Signed)
Last rx written  10/14/21. Last OV  09/18/21. Next OV - has not been scheduled. UDS  02/17/18.

## 2021-11-19 MED ORDER — OXYCODONE-ACETAMINOPHEN 7.5-325 MG PO TABS: 1.0000 | ORAL_TABLET | Freq: Three times a day (TID) | ORAL | 0 refills | Status: DC | PRN

## 2021-11-23 ENCOUNTER — Ambulatory Visit (INDEPENDENT_AMBULATORY_CARE_PROVIDER_SITE_OTHER): Payer: Medicare HMO | Admitting: Pharmacist

## 2021-11-23 DIAGNOSIS — T82868S Thrombosis of vascular prosthetic devices, implants and grafts, sequela: Secondary | ICD-10-CM

## 2021-11-23 DIAGNOSIS — Z7901 Long term (current) use of anticoagulants: Secondary | ICD-10-CM

## 2021-11-23 DIAGNOSIS — I779 Disorder of arteries and arterioles, unspecified: Secondary | ICD-10-CM | POA: Diagnosis not present

## 2021-11-23 LAB — POCT INR: INR: 5.7 — AB (ref 2.0–3.0)

## 2021-11-23 NOTE — Patient Instructions (Signed)
Patient instructed to take medications as defined in the Anti-coagulation Track section of this encounter.  Patient instructed to OMIT today's dose Monday 9-JAN-23. OMIT dose on Tuesday 10-JAN-23 Patient instructed to take one (1) of your 4mg  blue colored warfarin tablets, once-daily at 6PM--EXCEPT on SUNDAYS, take only one-half (1/2) of your 4mg  blue colored warfarin tablets on Sundays. OMIT your dose on Monday 9-JAN-23 and on Tuesday 10-JAN-23. Recommence taking warfarin on Wednesday, 11-JAN-23 as per your instructions provided.  Patient verbalized understanding of these instructions.
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
## 2021-11-24 NOTE — Progress Notes (Signed)
INTERNAL MEDICINE TEACHING ATTENDING ADDENDUM   I agree with pharmacy recommendations as outlined in their note.   Montie Swiderski, MD  

## 2021-11-26 ENCOUNTER — Ambulatory Visit: Payer: Medicare HMO

## 2021-11-26 NOTE — Chronic Care Management (AMB) (Signed)
Care Management    RN Visit Note  11/26/2021 Name: Kristin Knight MRN: [id number] DOB: 1946-04-19  Subjective: Kristin Knight is a 76 y.o. year old female who is a primary care patient of Kristin Falcon, MD. The care management team was consulted for assistance with disease management and care coordination needs.    Engaged with patient by telephone for follow up visit in response to provider referral for case management and/or care coordination services.   Consent to Services:   Kristin Knight was given information about Care Management services today including:  Care Management services includes personalized support from designated clinical staff supervised by her physician, including individualized plan of care and coordination with other care providers 24/7 contact phone numbers for assistance for urgent and routine care needs. The patient may stop case management services at any time by phone call to the office staff.  Patient agreed to services and consent obtained.   Assessment: Review of patient past medical history, allergies, medications, health status, including review of consultants reports, laboratory and other test data, was performed as part of comprehensive evaluation and provision of chronic care management services.   SDOH (Social Determinants of Health) assessments and interventions performed:    Care Plan  Allergies  Allergen Reactions   Penicillins Anaphylaxis and Other (See Comments)    Patient passed out Has patient had a PCN reaction causing immediate rash, facial/tongue/throat swelling, SOB or lightheadedness with hypotension: No Has patient had a PCN reaction causing severe rash involving mucus membranes or skin necrosis: No Has patient had a PCN reaction that required hospitalization: No Has patient had a PCN reaction occurring within the last 10 years: No If all of the above answers are "NO", then may proceed with Cephalosporin use.    Meperidine Hcl  Other (See Comments)    nevousness (Demerol)   Chantix [Varenicline] Nausea And Vomiting    Outpatient Encounter Medications as of 11/26/2021  Medication Sig   albuterol (VENTOLIN HFA) 108 (90 Base) MCG/ACT inhaler Inhale 2 puffs into the lungs every 6 (six) hours as needed for wheezing or shortness of breath.   atorvastatin (LIPITOR) 40 MG tablet Take 1 tablet (40 mg total) by mouth daily.   empagliflozin (JARDIANCE) 10 MG TABS tablet Take 1 tablet (10 mg total) by mouth daily before breakfast.   metoprolol succinate (TOPROL-XL) 25 MG 24 hr tablet Take 1 tablet (25 mg total) by mouth daily.   mirtazapine (REMERON) 30 MG tablet Take 1 tablet (30 mg total) by mouth at bedtime.   oxyCODONE-acetaminophen (PERCOCET) 7.5-325 MG tablet Take 1 tablet by mouth every 8 (eight) hours as needed for severe pain.   sacubitril-valsartan (ENTRESTO) 49-51 MG Take 1 tablet by mouth 2 (two) times daily.   spironolactone (ALDACTONE) 25 MG tablet TAKE 1 TABLET BY MOUTH ONCE DAILY   warfarin (COUMADIN) 4 MG tablet Take 1 tablet (4 mg total) by mouth daily at 4 PM.   furosemide (LASIX) 40 MG tablet Take 1 tablet (40 mg total) by mouth daily.   No facility-administered encounter medications on file as of 11/26/2021.    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 ([place]) 07/06/2019   Occlusion of femoropopliteal bypass graft ([place]) 11/91/4782   Uncomplicated opioid use 95/62/1308   Chronic venous insufficiency 01/31/2015   Chronic midline low  back pain without sciatica 10/25/2014   Osteoporosis 04/16/2014   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/23/2013   Internal hemorrhoids 01/26/2013   Diverticulosis 01/26/2013   Seasonal allergies 11/24/2012   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   Long term current use of anticoagulant therapy 10/09/2012    Healthcare maintenance 09/06/2012   Peripheral arterial occlusive disease ([place]) 05/14/2011   Mild chronic obstructive pulmonary disease ([place]) 07/15/2008   Major depressive disorder, recurrent, moderate ([place]) 09/28/2006   Essential hypertension 09/28/2006   History of tobacco abuse 09/28/2006    Conditions to be addressed/monitored: CHF, HTN, COPD, and Chronic Back Pain  Care Plan : RN Care Manager Plan of Care  Updates made by Kristin Killian, RN since 11/26/2021 12:00 AM     Problem: Health Promotion or Disease Self-Management (General Plan of Care)      Long-Range Goal: Chronic Disease Management and Care Coodiation Needs (CHF, COPD, HTN and [place] Bacl Pain)   Start Date: 11/26/2021  Priority: High  Note:   Current Barriers: Successful initial outreach to patient this afternoon.  Patient notes she has a little swelling in her feet and legs and she has some shortness of breath only when she wakes up and walks to the restroom.  Patient thought the shortness of breath was "a panic attack".  The dyspnea only lasts a little while in the morning and it goes away.  She has returned to work which is working in [employer] for  a [employer].  She works 4 days per week (Sunday- Wednesday). During discussion about her medications, patient shared that she takes her Percocet to be able to work.  She has chronic back pain.  Patient also shared that she has not had any falls and she does not use any DME for assistance.  Provided patient with direct phone number to [facility] if she has any questions or concerns. Knowledge Deficits related to plan of care for management of CHF, HTN, COPD, and Chronic Back Pain  Chronic Disease Management support and education needs related to CHF, HTN, COPD, and Chronic Back Pain  Financial Constraints  RNCM Clinical Goal(s):  Patient will verbalize basic understanding of  CHF, HTN, COPD, and Chronic Back Pain disease process and self health management plan as evidenced by  discussions with provider and RNCM. take all medications exactly as prescribed and will call provider for medication related questions as evidenced by appropriate refill time and collaboration with providers. continue to work with RN Care Manager to address care management and care coordination needs related to  CHF, HTN, COPD, and Chronic back pain as evidenced by adherence to CM Team Scheduled appointments not experience hospital admission as evidenced by review of EMR. Hospital Admissions in last 6 months = 2 experience decrease in ED visits as evidenced by EMR review.  ED visits in in last 6 months = 0  through collaboration with RN Care manager, provider, and care team.  Interventions: 1:1 collaboration with primary care provider regarding development and update of comprehensive plan of care as evidenced by provider attestation and co-signature Inter-disciplinary care team collaboration (see longitudinal plan of care) Evaluation of current treatment plan related to  self management and patient's adherence to plan as established by provider Heart Failure Interventions:  (Status:  Goal on track:  NO.) Long Term Goal- LVEF 25-35% Basic overview and discussion of pathophysiology of Heart Failure reviewed Discussed the importance of keeping all appointments with provider Assessed social determinant of health barriers  COPD Interventions:  (Status:  Goal on track:  Yes.) Long Term Goal Provided education about and advised patient to utilize infection prevention strategies to reduce risk of respiratory infection Discussed the importance of adequate rest and management of fatigue with COPD Hypertension Interventions:  (Status:  Goal on track:  NO.) Long Term Goal Last practice recorded BP readings:  BP Readings from Last 3 Encounters:  10/19/21 120/68  10/05/21 (!) 176/97  09/28/21 (!) 150/96  Most recent eGFR/CrCl:  Lab Results  Component Value Date   EGFR 42 (L) 09/18/2021    No components  found for: CRCL  Reviewed medications with patient and discussed importance of compliance Discussed plans with patient for ongoing care management follow up and provided patient with direct contact information for care management team Reviewed scheduled/upcoming provider appointments including:  Discussed complications of poorly controlled blood pressure such as heart disease, stroke, circulatory complications, vision complications, kidney impairment, sexual dysfunction  Patient Goals/Self-Care Activities: call office if I gain more than 2 pounds in one day or 5 pounds in one week use salt in moderation watch for swelling in feet, ankles and legs every day eliminate symptom triggers at home keep follow-up appointments: 12/07/21 Coumadin Clinic check blood pressure 3 times per week write blood pressure results in a log or diary keep a blood pressure log call doctor for signs and symptoms of high blood pressure keep all doctor appointments take medications for blood pressure exactly as prescribed  Follow Up Plan:  The patient has been provided with contact information for the care management team and has been advised to call with any health related questions or concerns.      Plan: The patient has been provided with contact information for the care management team and has been advised to call with any health related questions or concerns.   Kristin Killian, RN, BSN, CCM Care Management Coordinator [facility] Internal Medicine Phone: [phone]: [phone]

## 2021-11-26 NOTE — Patient Instructions (Signed)
Visit Information  Thank you for taking time to visit with me today. Please don't hesitate to contact me if I can be of assistance to you before our next scheduled telephone appointment.  Our next appointment is by telephone on 12/24/2021 at 12:30.  Please call the care guide team at [phone] if you need to cancel or reschedule your appointment.   If you are experiencing a Mental Health or [place] or need someone to talk to, please call the [place] National Suicide Prevention Lifeline: 534 475 2256 or TTY: 613-006-4331 TTY (832)203-4239) to talk to a trained counselor   Patient verbalizes understanding of instructions and care plan provided today and agrees to view in [place]. Active MyChart status confirmed with patient.    The patient has been provided with contact information for the care management team and has been advised to call with any health related questions or concerns.  Johnney Killian, RN, BSN, CCM Care Management Coordinator [facility] Internal Medicine Phone: [phone]: [phone]

## 2021-11-30 ENCOUNTER — Other Ambulatory Visit: Payer: Self-pay

## 2021-11-30 ENCOUNTER — Ambulatory Visit (HOSPITAL_COMMUNITY)
Admission: RE | Admit: 2021-11-30 | Discharge: 2021-11-30 | Disposition: A | Discharge status: Home / Self Care | Payer: Medicare HMO | Source: Ambulatory Visit | Attending: Cardiology | Admitting: Cardiology

## 2021-11-30 ENCOUNTER — Encounter (HOSPITAL_COMMUNITY): Payer: Self-pay | Admitting: Cardiology

## 2021-11-30 VITALS — BP 140/90 | HR 59 | Wt 120.8 lb

## 2021-11-30 DIAGNOSIS — I739 Peripheral vascular disease, unspecified: Secondary | ICD-10-CM | POA: Insufficient documentation

## 2021-11-30 DIAGNOSIS — Z87891 Personal history of nicotine dependence: Secondary | ICD-10-CM | POA: Diagnosis not present

## 2021-11-30 DIAGNOSIS — Z7901 Long term (current) use of anticoagulants: Secondary | ICD-10-CM | POA: Diagnosis not present

## 2021-11-30 DIAGNOSIS — J449 Chronic obstructive pulmonary disease, unspecified: Secondary | ICD-10-CM | POA: Diagnosis not present

## 2021-11-30 DIAGNOSIS — I2510 Atherosclerotic heart disease of native coronary artery without angina pectoris: Secondary | ICD-10-CM | POA: Diagnosis not present

## 2021-11-30 DIAGNOSIS — I110 Hypertensive heart disease with heart failure: Secondary | ICD-10-CM | POA: Insufficient documentation

## 2021-11-30 DIAGNOSIS — Z8249 Family history of ischemic heart disease and other diseases of the circulatory system: Secondary | ICD-10-CM | POA: Insufficient documentation

## 2021-11-30 DIAGNOSIS — I428 Other cardiomyopathies: Secondary | ICD-10-CM | POA: Diagnosis not present

## 2021-11-30 DIAGNOSIS — Z79899 Other long term (current) drug therapy: Secondary | ICD-10-CM | POA: Insufficient documentation

## 2021-11-30 DIAGNOSIS — E785 Hyperlipidemia, unspecified: Secondary | ICD-10-CM | POA: Diagnosis not present

## 2021-11-30 DIAGNOSIS — Z86718 Personal history of other venous thrombosis and embolism: Secondary | ICD-10-CM | POA: Insufficient documentation

## 2021-11-30 DIAGNOSIS — Z7984 Long term (current) use of oral hypoglycemic drugs: Secondary | ICD-10-CM | POA: Insufficient documentation

## 2021-11-30 DIAGNOSIS — I5022 Chronic systolic (congestive) heart failure: Secondary | ICD-10-CM | POA: Diagnosis not present

## 2021-11-30 DIAGNOSIS — Z95820 Peripheral vascular angioplasty status with implants and grafts: Secondary | ICD-10-CM | POA: Insufficient documentation

## 2021-11-30 LAB — BASIC METABOLIC PANEL
Anion gap: 8 (ref 5–15)
BUN: 9 mg/dL (ref 8–23)
CO2: 28 mmol/L (ref 22–32)
Calcium: 9.3 mg/dL (ref 8.9–10.3)
Chloride: 107 mmol/L (ref 98–111)
Creatinine, Ser: 1.08 mg/dL — ABNORMAL HIGH (ref 0.44–1.00)
GFR, Estimated: 54 mL/min — ABNORMAL LOW (ref >60)
Glucose, Bld: 85 mg/dL (ref 70–99)
Potassium: 3.8 mmol/L (ref 3.5–5.1)
Sodium: 143 mmol/L (ref 135–145)

## 2021-11-30 LAB — BRAIN NATRIURETIC PEPTIDE: B Natriuretic Peptide: 742.2 pg/mL — ABNORMAL HIGH (ref 0.0–100.0)

## 2021-11-30 MED ORDER — FUROSEMIDE 40 MG PO TABS: 40.0000 mg | ORAL_TABLET | Freq: Every day | ORAL | 11 refills | Status: DC

## 2021-11-30 MED ORDER — ENTRESTO 97-103 MG PO TABS: 1.0000 | ORAL_TABLET | Freq: Two times a day (BID) | ORAL | 3 refills | Status: DC

## 2021-11-30 NOTE — Progress Notes (Signed)
Primary Care: Sid Falcon, MD HF Cardiology: Dr. Aundra Dubin  HPI: Referred to clinic by Dr. Harrell Gave for heart failure consultation.   76 y.o. with history of HTN, PAD (left femoral to AK popliteal bypass with PTFE in 2012 and subsequent occlusion of bypass in 2013 and 2019), on chronic coumadin for graft patency, COPD, former heavy smoker ( quit 3 year ago)  and recently diagnosed systolic heart failure. Several family members with coronary diseae.    Admitted 09/11/21 with increased shortness of breath in the setting of new acute HFrEF. Echo EF 25-30%, diffuse HK, G1DD. She was diuresed with IV Lasix and placed GDMT. Did not get cardiac cath. Notes outline intent to conduct ischemic w/u as outpatient after she had recovered from acute exacerbation. Referred to [facility] clinic.   Presents today for f/u. Today she returns for HF follow up. Feels ok but had an episode of dyspnea earlier today. SOB with steps. Denies PND/Orthopnea. Appetite ok. No fever or chills. Weight at home has been stable. Taking all medications. Works 4 days a weeks in the [employer]. Receives SSI. Lives with her son. She does not drink alcohol or smoke.   ECG (personally reviewed): NSR, IVCD 130 msec  Labs (11/22): K 3.7, creatinine 1.03  PMH:  1. PAD: Left fem-pop bypass with h/o thrombotic graft occlusion x 2, now on warfarin.  2. HTN 3. Depression 4. Chronic systolic CHF: Nonischemic cardiomyopathy.  - Echo (10/22): EF 25-30%, mild LVH, RV normal. 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  5. Type 2 diabetes  Review of Systems: All systems reviewed and negative except as per HPI.   Current Outpatient Medications  Medication Sig Dispense Refill   atorvastatin (LIPITOR) 40 MG tablet Take 1 tablet (40 mg total) by mouth daily. 90 tablet 1   empagliflozin (JARDIANCE) 10 MG TABS tablet Take 1 tablet (10 mg total) by mouth daily before breakfast. 30 tablet 11   metoprolol succinate (TOPROL-XL)  25 MG 24 hr tablet Take 1 tablet (25 mg total) by mouth daily. 30 tablet 2   mirtazapine (REMERON) 30 MG tablet Take 1 tablet (30 mg total) by mouth at bedtime. 90 tablet 3   oxyCODONE-acetaminophen (PERCOCET) 7.5-325 MG tablet Take 1 tablet by mouth every 8 (eight) hours as needed for severe pain. 60 tablet 0   sacubitril-valsartan (ENTRESTO) 97-103 MG Take 1 tablet by mouth 2 (two) times daily. 180 tablet 3   spironolactone (ALDACTONE) 25 MG tablet TAKE 1 TABLET BY MOUTH ONCE DAILY 90 tablet 3   warfarin (COUMADIN) 4 MG tablet Take 1 tablet (4 mg total) by mouth daily at 4 PM. 30 tablet 2   albuterol (VENTOLIN HFA) 108 (90 Base) MCG/ACT inhaler Inhale 2 puffs into the lungs every 6 (six) hours as needed for wheezing or shortness of breath. (Patient not taking: Reported on 11/30/2021)     furosemide (LASIX) 40 MG tablet Take 1 tablet (40 mg total) by mouth daily. 30 tablet 11   No current facility-administered medications for this encounter.    Allergies  Allergen Reactions   Penicillins Anaphylaxis and Other (See Comments)    Patient passed out Has patient had a PCN reaction causing immediate rash, facial/tongue/throat swelling, SOB or lightheadedness with hypotension: No Has patient had a PCN reaction causing severe rash involving mucus membranes or skin necrosis: No Has patient had a PCN reaction that required hospitalization: No Has patient had a PCN reaction occurring within the last 10 years: No If  all of the above answers are "NO", then may proceed with Cephalosporin use.    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   Tobacco Use   Smoking status: Former    Packs/day: 1.50    Years: 20.00    Pack years: 30.00    Types: Cigarettes    Quit date: 07/24/2018    Years since quitting: 3.3   Smokeless tobacco: Never   Tobacco comments:    quit 07/24/18  Vaping Use   Vaping Use: Never used  Substance and Sexual Activity   Alcohol use: No    Alcohol/week: 0.0 standard drinks   Drug use: No   Sexual activity: Never  Other Topics Concern   Not on file  Social History Narrative   Not on file   Social Determinants of Health   Financial Resource Strain: High Risk   Difficulty of Paying Living Expenses: Hard  Food Insecurity: No Food Insecurity   Worried About Running Out of Food in the Last Year: Never true   Ran Out of Food in the Last Year: Never true  Transportation Needs: No Transportation Needs   Lack of Transportation (Medical): No   Lack of Transportation (Non-Medical): No  Physical Activity: Not on file  Stress: Not on file  Social Connections: Not on file  Intimate Partner Violence: Not on file      Family History  Problem Relation Age of Onset   Breast cancer Mother 56   Heart attack Father 74   Heart attack Sister 50   Heart attack Sister 53   Drug abuse Sister    Hypertension Daughter    Healthy Daughter    Healthy Daughter    Healthy Daughter    Breast cancer Maternal Aunt    Breast cancer Maternal Grandmother    Colon cancer Cousin        First cousin    Heart attack Brother 6   Heart attack Brother 64   Healthy Brother    Cancer Brother 40       Throat   Healthy Son    Healthy Son    Esophageal cancer Neg Hx    Stomach cancer Neg Hx    Rectal cancer Neg Hx     Vitals:   11/30/21 1440  BP: 140/90  Pulse: (!) 59  SpO2: 99%  Weight: 54.8 kg (120 lb 12.8 oz)    PHYSICAL EXAM: General: NAD Neck: JVP 8 cm, no thyromegaly or thyroid nodule.  Lungs: Clear to auscultation bilaterally with normal respiratory effort. CV: Nondisplaced PMI.  Heart regular S1/S2, no  S3/S4, no murmur.  Left leg 1+ edema to knee, right leg trace edema.  No carotid bruit.  Difficult to palpate pedal pulses.  Abdomen: Soft, nontender, no hepatosplenomegaly, no distention.  Skin: Intact without lesions or rashes.  Neurologic: Alert and oriented x 3.  Psych: Normal affect. Extremities: No clubbing or cyanosis.  HEENT: Normal.   ASSESSMENT & PLAN: 1.  Chronic systolic CHF: Nonischemic cardiomyopathy.  Echo 08/2021 with EF 25-30%. LHC in 11/22 showed nonobstructive CAD.  Cause uncertain, possibly due to viral myocarditis.  Patient has family history of CAD/MIs but not dilated cardiomyopathy. She has been out of Lasix for 1 week. On exam today, she is mildly volume overloaded.  NYHA class II-III symptoms.  - Restart Lasix 40 mg daily.  - Continue empagliflozin 10 mg daily.  - Continue Toprol Xl 25 mg daily.  No titration with HR in 50s.   - Increase Entresto to 97/103 bid.  BMET today and in 10 days.  - Cardiac MRI to look for infiltrative disease/myocarditis.  - Repeat echo at 3 month.  If EF remains low, she would be an ICD candidate.  QRS probably not wide enough to benefit from CRT.  2. PAD: Former smoker.  H/o left fem-pop bypass.  Had graft thrombosis x 2 now on long term coumadin to maintain graft patency.  - Followed by Dr Trula Slade - Continue warfarin. CBC today.  3. HTN: Elevated today, increasing Entresto.  4. Hyperlipidemia: Check lipids today, goal LDL < 70 with PAD.   Followup in 3 months with echo.   Loralie Champagne 11/30/2021

## 2021-11-30 NOTE — Patient Instructions (Signed)
Medication Changes:  Stop Entresto [address] Delene Loll 88/110 Twice daily   Lab Work:  Labs done today, your results will be available in MyChart, we will contact you for abnormal readings.   Testing/Procedures:  Your physician has requested that you have an echocardiogram. Echocardiography is a painless test that uses sound waves to create images of your heart. It provides your doctor with information about the size and shape of your heart and how well your hearts chambers and valves are working. This procedure takes approximately one hour. There are no restrictions for this procedure.  Your physician has requested that you have a cardiac MRI. Cardiac MRI uses a computer to create images of your heart as its beating, producing both still and moving pictures of your heart and major blood vessels. For further information please visit [email or web address] Please follow the instruction sheet given to you today for more information. ONCE APPROVED BY INSURANCE YOU WILL BE CONTACTED TO SCHEDULE AN APPOINTMENT  Referrals:  You have been referred to Heart Failure Pharmacist   Special Instructions // Education:  none  Follow-Up in: 3 months  At the [facility], you and your health needs are our priority. We have a designated team specialized in the treatment of Heart Failure. This Care Team includes your primary Heart Failure Specialized Cardiologist (physician), Advanced Practice Providers (APPs- Physician Assistants and Nurse Practitioners), and Pharmacist who all work together to provide you with the care you need, when you need it.   You may see any of the following providers on your designated Care Team at your next follow up:  Dr Glori Bickers Dr Haynes Kerns, NP Lyda Jester, [place] [facility] [place], [place] Audry Riles, PharmD   Please be sure to bring in all your medications bottles to every appointment.   Need to Contact  us:  If you have any questions or concerns before your next appointment please send [place] a message through [place] or call our office at [phone].    TO LEAVE A MESSAGE FOR THE NURSE SELECT OPTION 2, PLEASE LEAVE A MESSAGE INCLUDING: YOUR NAME DATE OF BIRTH CALL BACK NUMBER REASON FOR CALL**this is important as we prioritize the call backs  YOU WILL RECEIVE A CALL BACK THE SAME DAY AS LONG AS YOU CALL BEFORE 4:00 PM

## 2021-12-07 ENCOUNTER — Ambulatory Visit (INDEPENDENT_AMBULATORY_CARE_PROVIDER_SITE_OTHER): Payer: Medicare HMO | Admitting: Pharmacist

## 2021-12-07 DIAGNOSIS — T82868S Thrombosis of vascular prosthetic devices, implants and grafts, sequela: Secondary | ICD-10-CM | POA: Diagnosis not present

## 2021-12-07 DIAGNOSIS — Z7901 Long term (current) use of anticoagulants: Secondary | ICD-10-CM | POA: Diagnosis not present

## 2021-12-07 DIAGNOSIS — I779 Disorder of arteries and arterioles, unspecified: Secondary | ICD-10-CM

## 2021-12-07 LAB — POCT INR: INR: 2.7 (ref 2.0–3.0)

## 2021-12-07 NOTE — Patient Instructions (Signed)
Patient instructed to take medications as defined in the Anti-coagulation Track section of this encounter.  Patient instructed to take today's dose.  Patient instructed to take one (1) of your 4mg  blue colored warfarin tablets, once-daily at 6PM--EXCEPT on SUNDAYS, take only one-half (1/2) of your 4mg  blue colored warfarin tablets on Sundays.  Patient verbalized understanding of these instructions.

## 2021-12-07 NOTE — Progress Notes (Signed)
INTERNAL MEDICINE TEACHING ATTENDING ADDENDUM - Aldine Contes M.D  Duration- indefinite, Indication- recurrent vascular graft thrombosis, INR- therapeutic. Agree with pharmacy recommendations as outlined in their note.
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
## 2021-12-10 ENCOUNTER — Ambulatory Visit (HOSPITAL_COMMUNITY)
Admission: RE | Admit: 2021-12-10 | Discharge: 2021-12-10 | Disposition: A | Discharge status: Home / Self Care | Payer: Medicare HMO | Source: Ambulatory Visit | Attending: Internal Medicine | Admitting: Internal Medicine

## 2021-12-10 ENCOUNTER — Other Ambulatory Visit: Payer: Self-pay

## 2021-12-10 DIAGNOSIS — I5022 Chronic systolic (congestive) heart failure: Secondary | ICD-10-CM | POA: Diagnosis present

## 2021-12-10 LAB — BASIC METABOLIC PANEL
Anion gap: 6 (ref 5–15)
BUN: 20 mg/dL (ref 8–23)
CO2: 31 mmol/L (ref 22–32)
Calcium: 9.2 mg/dL (ref 8.9–10.3)
Chloride: 101 mmol/L (ref 98–111)
Creatinine, Ser: 1.17 mg/dL — ABNORMAL HIGH (ref 0.44–1.00)
GFR, Estimated: 49 mL/min — ABNORMAL LOW (ref >60)
Glucose, Bld: 104 mg/dL — ABNORMAL HIGH (ref 70–99)
Potassium: 4 mmol/L (ref 3.5–5.1)
Sodium: 138 mmol/L (ref 135–145)

## 2021-12-11 NOTE — Progress Notes (Incomplete)
***In Progress***    Advanced Heart Failure Clinic Note   Primary Care: Sid Falcon, MD HF Cardiology: Dr. Aundra Dubin  HPI:  Referred to clinic by Dr. Harrell Gave for heart failure consultation.    76 y.o. with history of HTN, PAD (left femoral to AK popliteal bypass with PTFE in 2012 and subsequent occlusion of bypass in 2013 and 2019), on chronic coumadin for graft patency, COPD, former heavy smoker ( quit 3 year ago)  and recently diagnosed systolic heart failure. Several family members with coronary diseae.     Admitted 09/11/21 with increased shortness of breath in the setting of new acute HFrEF. Echo EF 25-30%, diffuse HK, G1DD. She was diuresed with IV Lasix and placed GDMT. Did not get cardiac cath. Notes outline intent to conduct ischemic w/u as outpatient after she had recovered from acute exacerbation. Referred to [facility] clinic.    Recently presented to AHF Clinic for follow up 11/30/21. Felt ok but did note an episode of dyspnea earlier that day. SOB with steps. Denied PND/Orthopnea. Appetite was ok. No fever or chills. Weight at home had been stable. Reported taking all medications. Works 4 days a weeks in the [employer] in [place]. Received SSI. Lives with her son. She does not drink alcohol or smoke.   Today he returns to HF clinic for pharmacist medication titration. At last visit with MD Lasix 40 mg daily was restarted and Entresto was increased to 97/103 mg BID.   Shortness of breath/dyspnea on exertion? {YES NO:22349}  Orthopnea/PND? {YES NO:22349} Edema? {YES NO:22349} Lightheadedness/dizziness? {YES NO:22349} Daily weights at home? {YES NO:22349} Blood pressure/heart rate monitoring at home? {YES P5382123 Following low-sodium/fluid-restricted diet? {YES NO:22349}  HF Medications: Metoprolol succinate 25 mg daily Entresto 97/103 mg BID Spironolactone 25 mg daily Jardiance 10 mg daily Lasix 40 mg daily  Has the patient been experiencing any side  effects to the medications prescribed?  {YES NO:22349}  Does the patient have any problems obtaining medications due to transportation or finances?   {YES NO:22349}  Understanding of regimen: {excellent/good/fair/poor:19665} Understanding of indications: {excellent/good/fair/poor:19665} Potential of compliance: {excellent/good/fair/poor:19665} Patient understands to avoid NSAIDs. Patient understands to avoid decongestants.    Pertinent Lab Values: Serum creatinine ***, BUN ***, Potassium ***, Sodium ***, BNP ***, Magnesium ***, Digoxin ***   Vital Signs: Weight: *** (last clinic weight: ***) Blood pressure: ***  Heart rate: ***   Assessment/Plan: 1.  Chronic systolic CHF: Nonischemic cardiomyopathy.  Echo 08/2021 with EF 25-30%. 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 Cause uncertain, possibly due to viral myocarditis.  Patient has family history of CAD/MIs but not dilated cardiomyopathy.  - NYHA class II-III symptoms. Volume status *** - Continue Lasix 40 mg daily.  - Increase Entresto to 97/103 mg BID - Continue metoprolol XL 25 mg daily.  No titration with HR in 50s. - Continue empagliflozin 10 mg daily.    - Cardiac MRI to look for infiltrative disease/myocarditis.  - Repeat echo at 3 month.  If EF remains low, she would be an ICD candidate.  QRS probably not wide enough to benefit from CRT.  2. PAD: Former smoker.  H/o left fem-pop bypass.  Had graft thrombosis x 2 now on long term coumadin to maintain graft patency.  - Followed by Dr Trula Slade - Continue warfarin. CBC today.  3. HTN: Elevated today, *** 4. Hyperlipidemia: Check lipids today, goal LDL < 70 with PAD.   Follow up ***   Audry Riles, PharmD, BCPS, BCCP, CPP  Heart Failure Clinic Pharmacist [phone]

## 2021-12-18 ENCOUNTER — Telehealth (HOSPITAL_COMMUNITY): Payer: Self-pay | Admitting: *Deleted

## 2021-12-22 ENCOUNTER — Other Ambulatory Visit: Payer: Self-pay | Admitting: Student

## 2021-12-23 ENCOUNTER — Telehealth (HOSPITAL_COMMUNITY): Payer: Self-pay | Admitting: *Deleted

## 2021-12-23 NOTE — Telephone Encounter (Addendum)
Attempted to call patient regarding upcoming cardiac MRI appointment. Left message on voicemail with name and callback number  Shadoe Bethel RN Navigator Cardiac Imaging [facility] [phone] Office [phone] Cell  

## 2021-12-24 ENCOUNTER — Telehealth (HOSPITAL_COMMUNITY): Payer: Self-pay | Admitting: *Deleted

## 2021-12-24 ENCOUNTER — Inpatient Hospital Stay (HOSPITAL_COMMUNITY): Admission: RE | Admit: 2021-12-24 | Payer: Medicare HMO | Source: Ambulatory Visit

## 2021-12-24 ENCOUNTER — Telehealth: Payer: Self-pay

## 2021-12-24 ENCOUNTER — Telehealth: Payer: Medicare HMO

## 2021-12-24 NOTE — Telephone Encounter (Signed)
°  Care Management   Outreach Note  12/24/2021 Name: Kristin Knight MRN: [id number] DOB: Mar 01, 1946  Referred by: Sid Falcon, MD Reason for referral : Chronic Care Management   An unsuccessful telephone outreach was attempted today. The patient was referred to the case management team for assistance with care management and care coordination.   Follow Up Plan: If patient returns call to provider office, please advise to call [place]* at [phone] to reschedule*  Johnney Killian, RN, BSN, [facility] Phone: [phone]: [phone]

## 2021-12-24 NOTE — Telephone Encounter (Signed)
Reaching out to patient to offer assistance regarding upcoming cardiac imaging study; pt verbalizes understanding of appt date/time, parking situation and where to check in, and verified current allergies; name and call back number provided for further questions should they arise  Khia Dieterich RN Navigator Cardiac Imaging [facility] [phone] office [phone] cell  Patient denies metal or claustrophobia. 

## 2021-12-25 ENCOUNTER — Ambulatory Visit (HOSPITAL_COMMUNITY)
Admission: RE | Admit: 2021-12-25 | Discharge: 2021-12-25 | Disposition: A | Discharge status: Home / Self Care | Payer: Medicare HMO | Source: Ambulatory Visit | Attending: Cardiology | Admitting: Cardiology

## 2021-12-25 ENCOUNTER — Other Ambulatory Visit: Payer: Self-pay

## 2021-12-25 DIAGNOSIS — I5022 Chronic systolic (congestive) heart failure: Secondary | ICD-10-CM

## 2021-12-25 MED ORDER — GADOBUTROL 1 MMOL/ML IV SOLN
6.0000 mL | Freq: Once | INTRAVENOUS | Status: AC | PRN
  Administered 2021-12-25: 6 mL via INTRAVENOUS

## 2021-12-28 ENCOUNTER — Telehealth (HOSPITAL_COMMUNITY): Payer: Self-pay

## 2021-12-28 NOTE — Telephone Encounter (Addendum)
°  Pt aware, agreeable, and verbalized understanding  ----- Message from Larey Dresser, MD sent at 12/25/2021  3:54 PM EST ----- Good news, LV function back to normal range, no scar noted.

## 2022-01-04 ENCOUNTER — Ambulatory Visit: Payer: Medicare HMO

## 2022-01-07 ENCOUNTER — Other Ambulatory Visit: Payer: Self-pay | Admitting: Student

## 2022-01-07 ENCOUNTER — Ambulatory Visit: Payer: Medicare HMO

## 2022-01-07 DIAGNOSIS — G8929 Other chronic pain: Secondary | ICD-10-CM

## 2022-01-07 DIAGNOSIS — M545 Low back pain, unspecified: Secondary | ICD-10-CM

## 2022-01-07 MED ORDER — OXYCODONE-ACETAMINOPHEN 7.5-325 MG PO TABS: 1.0000 | ORAL_TABLET | Freq: Three times a day (TID) | ORAL | 0 refills | Status: DC | PRN

## 2022-01-07 NOTE — Patient Instructions (Signed)
Visit Information  Thank you for taking time to visit with me today. Please don't hesitate to contact me if I can be of assistance to you before our next scheduled telephone appointment.  Our next appointment is by telephone on 02/04/22 at 1:00.  Please call the care guide team at [phone] if you need to cancel or reschedule your appointment.   If you are experiencing a Mental Health or [place] or need someone to talk to, please call the [place] National Suicide Prevention Lifeline: 6806360413 or TTY: [phone] TTY [phone]) to talk to a trained counselor   Patient verbalizes understanding of instructions and care plan provided today and agrees to view in [place]. Active MyChart status confirmed with patient.    The patient has been provided with contact information for the care management team and has been advised to call with any health related questions or concerns.   Johnney Killian, RN, BSN, CCM Care Management Coordinator [facility] Internal Medicine Phone: [phone]: [phone]

## 2022-01-07 NOTE — Telephone Encounter (Signed)
Received message from Neoma Laming, [place], stating that patient needs a refill on her percocet 7.5-325mg  q8h prn. Appears that patient has been on a stable dose of percocet for management of chronic midline low back pain without sciatica. Reviewed PDMP and appropriate, last refilled for 20-day supply on 11/19/2021. Will refill for another 20-day supply today.  -refilled percocet 7.5-325mg  q8h prn for 60 tablets

## 2022-01-07 NOTE — Chronic Care Management (AMB) (Signed)
Care Management    RN Visit Note  01/07/2022 Name: Kristin Knight MRN: [id number] DOB: [birth year]  Subjective: Kristin Knight is a 76 y.o. year old female who is a primary care patient of Sid Falcon, MD. The care management team was consulted for assistance with disease management and care coordination needs.    Engaged with patient by telephone for follow up visit in response to provider referral for case management and/or care coordination services.   Consent to Services:   [name] was given information about Care Management services today including:  Care Management services includes personalized support from designated clinical staff supervised by her physician, including individualized plan of care and coordination with other care providers 24/7 contact phone numbers for assistance for urgent and routine care needs. The patient may stop case management services at any time by phone call to the office staff.  Patient agreed to services and consent obtained.   Assessment: Review of patient past medical history, allergies, medications, health status, including review of consultants reports, laboratory and other test data, was performed as part of comprehensive evaluation and provision of chronic care management services.   SDOH (Social Determinants of Health) assessments and interventions performed:    Care Plan  Allergies  Allergen Reactions   Penicillins Anaphylaxis and Other (See Comments)    Patient passed out Has patient had a PCN reaction causing immediate rash, facial/tongue/throat swelling, SOB or lightheadedness with hypotension: No Has patient had a PCN reaction causing severe rash involving mucus membranes or skin necrosis: No Has patient had a PCN reaction that required hospitalization: No Has patient had a PCN reaction occurring within the last 10 years: No If all of the above answers are "NO", then may proceed with Cephalosporin use.    Meperidine Hcl  Other (See Comments)    nevousness (Demerol)   Chantix [Varenicline] Nausea And Vomiting    Outpatient Encounter Medications as of 01/07/2022  Medication Sig   albuterol (VENTOLIN HFA) 108 (90 Base) MCG/ACT inhaler Inhale 2 puffs into the lungs every 6 (six) hours as needed for wheezing or shortness of breath. (Patient not taking: Reported on 11/30/2021)   atorvastatin (LIPITOR) 40 MG tablet Take 1 tablet (40 mg total) by mouth daily.   empagliflozin (JARDIANCE) 10 MG TABS tablet Take 1 tablet (10 mg total) by mouth daily before breakfast.   furosemide (LASIX) 40 MG tablet Take 1 tablet (40 mg total) by mouth daily.   metoprolol succinate (TOPROL-XL) 25 MG 24 hr tablet Take 1 tablet by mouth once daily   mirtazapine (REMERON) 30 MG tablet Take 1 tablet (30 mg total) by mouth at bedtime.   oxyCODONE-acetaminophen (PERCOCET) 7.5-325 MG tablet Take 1 tablet by mouth every 8 (eight) hours as needed for severe pain.   sacubitril-valsartan (ENTRESTO) 97-103 MG Take 1 tablet by mouth 2 (two) times daily.   spironolactone (ALDACTONE) 25 MG tablet TAKE 1 TABLET BY MOUTH ONCE DAILY   warfarin (COUMADIN) 4 MG tablet Take 1 tablet (4 mg total) by mouth daily at 4 PM.   No facility-administered encounter medications on file as of 01/07/2022.    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[place]) 44/46/1901   Uncomplicated opioid use 22/24/1146   Chronic venous insufficiency 01/31/2015  Chronic midline low back pain without sciatica 10/25/2014   Osteoporosis 04/16/2014   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/23/2013   Internal hemorrhoids 01/26/2013   Diverticulosis 01/26/2013   Seasonal allergies 11/24/2012   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   Long term current use of  anticoagulant therapy 10/09/2012   Healthcare maintenance 09/06/2012   Peripheral arterial occlusive disease ([place]) 05/14/2011   Mild chronic obstructive pulmonary disease ([place]) 07/15/2008   Major depressive disorder, recurrent, moderate ([place]) 09/28/2006   Essential hypertension 09/28/2006   History of tobacco abuse 09/28/2006    Conditions to be addressed/monitored: CHF and COPD  Care Plan : RN Care Manager Plan of Care  Updates made by Johnney Killian, RN since 01/07/2022 12:00 AM     Problem: Health Promotion or Disease Self-Management (General Plan of Care)      Long-Range Goal: Chronic Disease Management and Care Coodiation Needs (CHF, COPD, HTN and Chronic Back Pain)   Start Date: 11/26/2021  Priority: High  Note:   Current Barriers: Successful initial outreach to patient this afternoon. Patient shared that she is in the process of arranging a funeral for a family member. Patient requested a refill on her Oxycodone 7.5-325 mg.  Patient last received 60 tabs on January 5th.  Message sent to [facility] resident Dt. Allyson Sabal as Dr. Daryll Drown is not available.  Pending response on refill.  This RNCM shared that I would follow up with her in 4 weeks and she should check with her pharmacy later today or tomorrow to see if her prescription has been sent. Knowledge Deficits related to plan of care for management of CHF, HTN, COPD, and Chronic Back Pain  Chronic Disease Management support and education needs related to CHF, HTN, COPD, and Chronic Back Pain  Financial Constraints  RNCM Clinical Goal(s):  Patient will verbalize basic understanding of  CHF, HTN, COPD, and Chronic Back Pain disease process and self health management plan as evidenced by discussions with provider and RNCM. take all medications exactly as prescribed and will call provider for medication related questions as evidenced by appropriate refill time and collaboration with providers. continue to work with RN Care Manager to address  care management and care coordination needs related to  CHF, HTN, COPD, and Chronic back pain as evidenced by adherence to CM Team Scheduled appointments not experience hospital admission as evidenced by review of EMR. Hospital Admissions in last 6 months = 2 experience decrease in ED visits as evidenced by EMR review.  ED visits in in last 6 months = 0  through collaboration with RN Care manager, provider, and care team.  Interventions: 1:1 collaboration with primary care provider regarding development and update of comprehensive plan of care as evidenced by provider attestation and co-signature Inter-disciplinary care team collaboration (see longitudinal plan of care) Evaluation of current treatment plan related to  self management and patient's adherence to plan as established by provider Heart Failure Interventions:  (Status:  Goal on track:  NO.) Long Term Goal Basic overview and discussion of pathophysiology of Heart Failure reviewed Discussed the importance of keeping all appointments with provider Assessed social determinant of health barriers  COPD Interventions:  (Status:  Goal on track:  Yes.) Long Term Goal Provided education about and advised patient to utilize infection prevention strategies to reduce risk of respiratory infection Discussed the importance of adequate rest and management of fatigue with COPD Hypertension Interventions:  (Status:  Goal on track:  NO.) Long Term Goal Last practice recorded BP readings:  BP Readings  from Last 3 Encounters:  11/30/21 140/90  10/19/21 120/68  10/05/21 (!) 176/97  Most recent eGFR/CrCl:  Lab Results  Component Value Date   EGFR 42 (L) 09/18/2021    No components found for: CRCL  Reviewed medications with patient and discussed importance of compliance Discussed plans with patient for ongoing care management follow up and provided patient with direct contact information for care management team Reviewed scheduled/upcoming provider  appointments including:  Discussed complications of poorly controlled blood pressure such as heart disease, stroke, circulatory complications, vision complications, kidney impairment, sexual dysfunction  Patient Goals/Self-Care Activities: call office if I gain more than 2 pounds in one day or 5 pounds in one week use salt in moderation watch for swelling in feet, ankles and legs every day eliminate symptom triggers at home keep follow-up appointments:   check blood pressure 3 times per week write blood pressure results in a log or diary keep a blood pressure log call doctor for signs and symptoms of high blood pressure keep all doctor appointments take medications for blood pressure exactly as prescribed  Follow Up Plan:  The patient has been provided with contact information for the care management team and has been advised to call with any health related questions or concerns.     Johnney Killian, RN, BSN, CCM Care Management Coordinator [facility] Internal Medicine Phone: [phone]: [phone]

## 2022-01-12 NOTE — Progress Notes (Incomplete)
***In Progress***    Advanced Heart Failure Clinic Note   Primary Care: Sid Falcon, MD HF Cardiology: Dr. Aundra Dubin  HPI:  Referred to clinic by Dr. Harrell Gave for heart failure consultation.    76 y.o. with history of HTN, PAD (left femoral to [place] popliteal bypass with PTFE in 2012 and subsequent occlusion of bypass in 2013 and 2019), on chronic Coumadin for graft patency, COPD, former heavy smoker (quit 3 year ago)  and recently diagnosed systolic heart failure. Several family members with coronary diseae.     Admitted 09/11/21 with increased shortness of breath in the setting of new acute HFrEF. Echo EF 25-30%, diffuse HK, G1DD. She was diuresed with IV Lasix and placed GDMT. Did not get cardiac cath. Notes outline intent to conduct ischemic w/u as outpatient after she had recovered from acute exacerbation. Referred to [facility] clinic.    Last presented 11/30/21 for HF follow up with Dr. Aundra Dubin. Reported feeling ok but had an episode of dyspnea earlier that day. SOB with steps. Denied PND/Orthopnea. Appetite was ok. No fever or chills. Weight at home had been stable. Reported taking all medications. Reported she works 4 days a weeks in the [employer], receives SSI, and lives with her son. Reported she does not drink alcohol or smoke.   Today she returns to HF clinic for pharmacist medication titration. At last visit with MD, Delene Loll was increased to 97-103 mg BID. Follow up BMET 10 days later was stable.   Overall feeling ***. Dizziness, lightheadedness, fatigue:  Chest pain or palpitations:  How is your breathing?: *** SOB: Able to complete all ADLs. Activity level ***  Weight at home pounds. Takes furosemide/torsemide/bumex *** mg *** daily.  LEE PND/Orthopnea  Appetite *** Low-salt diet:   Physical Exam Cost/affordability of meds   HF Medications: Metoprolol succinate 25 mg daily Entresto 97-103 mg BID Spironolactone 25 mg daily Jardiance 10 mg  daily Lasix 40 mg daily  Has the patient been experiencing any side effects to the medications prescribed?  {YES NO:22349}  Does the patient have any problems obtaining medications due to transportation or finances?   {YES NO:22349}  Understanding of regimen: {excellent/good/fair/poor:19665} Understanding of indications: {excellent/good/fair/poor:19665} Potential of compliance: {excellent/good/fair/poor:19665} Patient understands to avoid NSAIDs. Patient understands to avoid decongestants.    Pertinent Lab Values: 12/10/21: Serum creatinine 1.17, BUN 20, Potassium 4.0, Sodium 138  Vital Signs: Weight: *** (last clinic weight: 120.8 lb) Blood pressure: ***  Heart rate: ***   Assessment/Plan: 1.  Chronic systolic CHF: Nonischemic cardiomyopathy.  Echo 08/2021 with EF 25-30%. 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 Cause uncertain, possibly due to viral myocarditis.  Patient has family history of CAD/MIs but not dilated cardiomyopathy. *** Cardiac MRI 12/25/21 showed EF 55% and no definitive evidence for prior MI, infiltrative disease, or myocarditis. *** She has been out of Lasix for 1 week. On exam today, she is *** mildly volume overloaded.  NYHA class II-III symptoms.  - Restart Lasix 40 mg daily.  - Continue metoprolol succinate 25 mg daily.  No titration with HR in 50s.   - Continue Entresto to 97/103 mg BID.  - Continue spironolactone 25 mg daily - Continue Jardiance 10 mg daily.  - Repeat echo at 3 month. *** If EF remains low, she would be an ICD candidate.  QRS probably not wide enough to benefit from CRT.  2. PAD: Former smoker.  H/o left fem-pop bypass.  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  - Followed by Dr. Trula Slade - Continue warfarin. CBC today.  3. HTN: Elevated today, increasing Entresto.  4. Hyperlipidemia: Check lipids *** today, goal LDL < 70 mg/dL with PAD.     Follow up ***   Audry Riles, PharmD, BCPS, BCCP,  CPP Heart Failure Clinic Pharmacist [phone]

## 2022-01-13 ENCOUNTER — Inpatient Hospital Stay (HOSPITAL_COMMUNITY): Admission: RE | Admit: 2022-01-13 | Payer: Medicare HMO | Source: Ambulatory Visit

## 2022-01-21 ENCOUNTER — Ambulatory Visit (HOSPITAL_COMMUNITY): Payer: Medicare HMO | Attending: Internal Medicine

## 2022-01-21 ENCOUNTER — Other Ambulatory Visit: Payer: Self-pay

## 2022-01-21 DIAGNOSIS — I5020 Unspecified systolic (congestive) heart failure: Secondary | ICD-10-CM | POA: Insufficient documentation

## 2022-01-21 DIAGNOSIS — I5022 Chronic systolic (congestive) heart failure: Secondary | ICD-10-CM | POA: Diagnosis not present

## 2022-01-21 DIAGNOSIS — I779 Disorder of arteries and arterioles, unspecified: Secondary | ICD-10-CM | POA: Diagnosis present

## 2022-01-21 LAB — ECHOCARDIOGRAM COMPLETE
Area-P 1/2: 3.72 cm2
S' Lateral: 3.1 cm

## 2022-01-22 ENCOUNTER — Telehealth: Payer: Self-pay | Admitting: Internal Medicine

## 2022-01-22 NOTE — Telephone Encounter (Signed)
Patient is calling back to receive her results. Please call back ?

## 2022-01-22 NOTE — Telephone Encounter (Signed)
Left a message to call back.

## 2022-01-27 ENCOUNTER — Other Ambulatory Visit: Payer: Self-pay | Admitting: Pharmacist

## 2022-02-01 NOTE — Telephone Encounter (Signed)
Called pt reviewed results of echocardiogram and MD recommendations.  Pt had no questions or concerns.  ?

## 2022-02-03 ENCOUNTER — Other Ambulatory Visit: Payer: Self-pay | Admitting: Student

## 2022-02-04 ENCOUNTER — Telehealth: Payer: Medicare HMO

## 2022-02-11 ENCOUNTER — Ambulatory Visit: Payer: Medicare HMO

## 2022-02-11 NOTE — Chronic Care Management (AMB) (Signed)
Care Management    RN Visit Note  02/11/2022 Name: Kristin Knight MRN: [id number] DOB: 1946-08-18  Subjective: Kristin Knight is a 76 y.o. year old female who is a primary care patient of Kristin Catalina, MD. The care management team was consulted for assistance with disease management and care coordination needs.    Engaged with patient by telephone for follow up visit in response to provider referral for case management and/or care coordination services.   Consent to Services:   Kristin Knight was given information about Care Management services today including:  Care Management services includes personalized support from designated clinical staff supervised by her physician, including individualized plan of care and coordination with other care providers 24/7 contact phone numbers for assistance for urgent and routine care needs. The patient may stop case management services at any time by phone call to the office staff.  Patient agreed to services and consent obtained.   Assessment: Review of patient past medical history, allergies, medications, health status, including review of consultants reports, laboratory and other test data, was performed as part of comprehensive evaluation and provision of chronic care management services.   SDOH (Social Determinants of Health) assessments and interventions performed:    Care Plan  Allergies  Allergen Reactions   Penicillins Anaphylaxis and Other (See Comments)    Patient passed out Has patient had a PCN reaction causing immediate rash, facial/tongue/throat swelling, SOB or lightheadedness with hypotension: No Has patient had a PCN reaction causing severe rash involving mucus membranes or skin necrosis: No Has patient had a PCN reaction that required hospitalization: No Has patient had a PCN reaction occurring within the last 10 years: No If all of the above answers are "NO", then may proceed with Cephalosporin use.    Meperidine Hcl  Other (See Comments)    nevousness (Demerol)   Chantix [Varenicline] Nausea And Vomiting    Outpatient Encounter Medications as of 02/11/2022  Medication Sig   albuterol (VENTOLIN HFA) 108 (90 Base) MCG/ACT inhaler Inhale 2 puffs into the lungs every 6 (six) hours as needed for wheezing or shortness of breath. (Patient not taking: Reported on 11/30/2021)   atorvastatin (LIPITOR) 40 MG tablet Take 1 tablet by mouth once daily   empagliflozin (JARDIANCE) 10 MG TABS tablet Take 1 tablet (10 mg total) by mouth daily before breakfast.   furosemide (LASIX) 40 MG tablet Take 1 tablet (40 mg total) by mouth daily.   metoprolol succinate (TOPROL-XL) 25 MG 24 hr tablet Take 1 tablet by mouth once daily   mirtazapine (REMERON) 30 MG tablet Take 1 tablet (30 mg total) by mouth at bedtime.   oxyCODONE-acetaminophen (PERCOCET) 7.5-325 MG tablet Take 1 tablet by mouth every 8 (eight) hours as needed for severe pain.   sacubitril-valsartan (ENTRESTO) 97-103 MG Take 1 tablet by mouth 2 (two) times daily.   spironolactone (ALDACTONE) 25 MG tablet TAKE 1 TABLET BY MOUTH ONCE DAILY   warfarin (COUMADIN) 4 MG tablet TAKE 1 TABLET BY MOUTH ONCE DAILY AT 4 PM   No facility-administered encounter medications on file as of 02/11/2022.    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 (HCC) 07/06/2019   Occlusion of femoropopliteal bypass graft (HCC) 07/25/2018   Uncomplicated opioid use 02/21/2017   Chronic venous insufficiency 01/31/2015   Chronic midline low  back pain without sciatica 10/25/2014   Osteoporosis 04/16/2014   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/23/2013   Internal hemorrhoids 01/26/2013   Diverticulosis 01/26/2013   Seasonal allergies 11/24/2012   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HCC) 11/24/2012   Long term current use of anticoagulant therapy  10/09/2012   Healthcare maintenance 09/06/2012   Peripheral arterial occlusive disease (HCC) 05/14/2011   Mild chronic obstructive pulmonary disease (HCC) 07/15/2008   Major depressive disorder, recurrent, moderate (HCC) 09/28/2006   Essential hypertension 09/28/2006   History of tobacco abuse 09/28/2006    Conditions to be addressed/monitored: CHF, HTN, COPD, and Chronic back pain  Care Plan : RN Care Manager Plan of Care  Updates made by Jodelle Gross, RN since 02/11/2022 12:00 AM     Problem: Health Promotion or Disease Self-Management (General Plan of Care)      Long-Range Goal: Chronic Disease Management and Care Coodiation Needs (CHF, COPD, HTN and Chronic Back Pain)   Start Date: 11/26/2021  Priority: High  Note:   Current Barriers: Successful initial outreach to patient this afternoon. Patient stated she was running errands and asked if I could call at 3:00.  Attempted to call patient back and was unable to reach her.  Plan to have care guide reschedule. Knowledge Deficits related to plan of care for management of CHF, HTN, COPD, and Chronic Back Pain  Chronic Disease Management support and education needs related to CHF, HTN, COPD, and Chronic Back Pain  Financial Constraints  RNCM Clinical Goal(s):  Patient will verbalize basic understanding of  CHF, HTN, COPD, and Chronic Back Pain disease process and self health management plan as evidenced by discussions with provider and RNCM. take all medications exactly as prescribed and will call provider for medication related questions as evidenced by appropriate refill time and collaboration with providers. continue to work with RN Care Manager to address care management and care coordination needs related to  CHF, HTN, COPD, and Chronic back pain as evidenced by adherence to CM Team Scheduled appointments not experience hospital admission as evidenced by review of EMR. Hospital Admissions in last 6 months = 2 experience decrease  in ED visits as evidenced by EMR review.  ED visits in in last 6 months = 0  through collaboration with RN Care manager, provider, and care team.  Interventions: 1:1 collaboration with primary care provider regarding development and update of comprehensive plan of care as evidenced by provider attestation and co-signature Inter-disciplinary care team collaboration (see longitudinal plan of care) Evaluation of current treatment plan related to  self management and patient's adherence to plan as established by provider Heart Failure Interventions:  (Status:  Goal on track:  NO.) Long Term Goal Basic overview and discussion of pathophysiology of Heart Failure reviewed Discussed the importance of keeping all appointments with provider Assessed social determinant of health barriers  COPD Interventions:  (Status:  Goal on track:  Yes.) Long Term Goal Provided education about and advised patient to utilize infection prevention strategies to reduce risk of respiratory infection Discussed the importance of adequate rest and management of fatigue with COPD Hypertension Interventions:  (Status:  Goal on track:  NO.) Long Term Goal Last practice recorded BP readings:  BP Readings from Last 3 Encounters:  11/30/21 140/90  10/19/21 120/68  10/05/21 (!) 176/97  Most recent eGFR/CrCl:  Lab Results  Component Value Date   EGFR 42 (L) 09/18/2021    No components found for: CRCL  Reviewed medications with patient and discussed importance of compliance Discussed plans with  patient for ongoing care management follow up and provided patient with direct contact information for care management team Reviewed scheduled/upcoming provider appointments including:  Discussed complications of poorly controlled blood pressure such as heart disease, stroke, circulatory complications, vision complications, kidney impairment, sexual dysfunction  Patient Goals/Self-Care Activities: call office if I gain more than 2 pounds  in one day or 5 pounds in one week use salt in moderation watch for swelling in feet, ankles and legs every day eliminate symptom triggers at home keep follow-up appointments:   check blood pressure 3 times per week write blood pressure results in a log or diary keep a blood pressure log call doctor for signs and symptoms of high blood pressure keep all doctor appointments take medications for blood pressure exactly as prescribed  Follow Up Plan:  The patient has been provided with contact information for the care management team and has been advised to call with any health related questions or concerns.       Plan: The patient has been provided with contact information for the care management team and has been advised to call with any health related questions or concerns.  Jodelle Gross, RN, BSN, CCM Care Management Coordinator [facility] Internal Medicine Phone: [phone]/Fax: [phone]

## 2022-02-15 ENCOUNTER — Ambulatory Visit (INDEPENDENT_AMBULATORY_CARE_PROVIDER_SITE_OTHER): Payer: Medicare HMO | Admitting: Pharmacist

## 2022-02-15 DIAGNOSIS — Z7901 Long term (current) use of anticoagulants: Secondary | ICD-10-CM | POA: Diagnosis not present

## 2022-02-15 DIAGNOSIS — I779 Disorder of arteries and arterioles, unspecified: Secondary | ICD-10-CM

## 2022-02-15 DIAGNOSIS — T82868S Thrombosis of vascular prosthetic devices, implants and grafts, sequela: Secondary | ICD-10-CM | POA: Diagnosis not present

## 2022-02-15 LAB — POCT INR: INR: 2.7 (ref 2.0–3.0)

## 2022-02-15 NOTE — Patient Instructions (Signed)
Patient instructed to take medications as defined in the Anti-coagulation Track section of this encounter.  ?Patient instructed to take today's dose.  ?Patient instructed to take one (1) of your '4mg'$  blue colored warfarin tablets, once-daily at 6PM--EXCEPT on SUNDAYS, take only one-half (1/2) of your '4mg'$  blue colored warfarin tablets on Sundays.  ?Patient verbalized understanding of these instructions.   ?
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
## 2022-02-16 NOTE — Progress Notes (Signed)
Evaluation and management procedures were performed by the Clinical Pharmacy Practitioner under my supervision and collaboration. I have reviewed the Practitioner's note and chart, and I agree with the management and plan as documented above. ° °
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
## 2022-02-23 ENCOUNTER — Ambulatory Visit: Payer: Medicare HMO | Admitting: Internal Medicine

## 2022-02-25 ENCOUNTER — Telehealth: Payer: Self-pay

## 2022-02-25 ENCOUNTER — Telehealth: Payer: Medicare HMO

## 2022-02-25 NOTE — Telephone Encounter (Signed)
?  Care Management  ? ?Outreach Note ? ?02/25/2022 ?Name: Kristin Knight MRN: [id number] DOB: [birth year] ? ?Referred by: Sid Falcon, MD ?Reason for referral : No chief complaint on file. ? ? ?An unsuccessful telephone outreach was attempted today. The patient was referred to the case management team for assistance with care management and care coordination.  ? ?Follow Up Plan: If patient returns call to provider office, please advise to call [facility] at [phone]. ? ?Johnney Killian, RN, BSN, CCM ?Care Management Coordinator ?[facility] ?Phone: [phone]/WHQ: [phone]  ?

## 2022-03-02 ENCOUNTER — Other Ambulatory Visit: Payer: Self-pay | Admitting: Internal Medicine

## 2022-03-11 ENCOUNTER — Other Ambulatory Visit (HOSPITAL_COMMUNITY): Payer: Medicare HMO

## 2022-03-11 ENCOUNTER — Encounter (HOSPITAL_COMMUNITY): Payer: Medicare HMO | Admitting: Cardiology

## 2022-03-22 ENCOUNTER — Ambulatory Visit: Payer: Medicare HMO

## 2022-03-23 ENCOUNTER — Ambulatory Visit (INDEPENDENT_AMBULATORY_CARE_PROVIDER_SITE_OTHER): Payer: Medicare HMO | Admitting: Pharmacist

## 2022-03-23 ENCOUNTER — Other Ambulatory Visit: Payer: Self-pay | Admitting: Internal Medicine

## 2022-03-23 DIAGNOSIS — T82868S Thrombosis of vascular prosthetic devices, implants and grafts, sequela: Secondary | ICD-10-CM

## 2022-03-23 DIAGNOSIS — I779 Disorder of arteries and arterioles, unspecified: Secondary | ICD-10-CM | POA: Diagnosis not present

## 2022-03-23 DIAGNOSIS — G8929 Other chronic pain: Secondary | ICD-10-CM

## 2022-03-23 DIAGNOSIS — Z7901 Long term (current) use of anticoagulants: Secondary | ICD-10-CM | POA: Diagnosis not present

## 2022-03-23 LAB — POCT INR: INR: 1.8 — AB (ref 2.0–3.0)

## 2022-03-23 NOTE — Telephone Encounter (Signed)
Last rx written 01/07/22. ?Last OV  09/18/21. ?Next OV has not been scheduled. ?UDS 02/17/18. ? ?

## 2022-03-23 NOTE — Progress Notes (Signed)
INTERNAL MEDICINE TEACHING ATTENDING ADDENDUM - Aldine Contes M.D  ?Duration- indefinite, Indication- recurrent vascular graft thrombosis, INR- subtherapeutic. Agree with pharmacy recommendations as outlined in their note.  ? ? ? ?

## 2022-03-23 NOTE — Patient Instructions (Signed)
Patient instructed to take medications as defined in the Anti-coagulation Track section of this encounter.  ?Patient instructed to take today's dose.  ?Patient instructed to take one (1) of your '4mg'$  blue warfarin tablets by mouth, once-daily at [facility]. ?Patient verbalized understanding of these instructions.   ?

## 2022-03-23 NOTE — Telephone Encounter (Signed)
MED REFILL REQUEST ? ?oxyCODONE-acetaminophen (PERCOCET) 7.5-325 MG tablet ? ?[place] ([place]), [place] - [address] Phone:  [phone]  ?Fax:  [phone]  ?  ? ?
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
## 2022-03-24 ENCOUNTER — Other Ambulatory Visit: Payer: Self-pay | Admitting: Pharmacist

## 2022-03-24 MED ORDER — WARFARIN SODIUM 4 MG PO TABS: ORAL_TABLET | ORAL | 0 refills | Status: DC

## 2022-03-24 NOTE — Telephone Encounter (Signed)
Patient requests refill on her wafarin. Current taking '4mg'$  warfarin PO daily. Just seen in [place] on 9-MAY-23. Will send electronically. ?

## 2022-03-29 MED ORDER — OXYCODONE-ACETAMINOPHEN 7.5-325 MG PO TABS: 1.0000 | ORAL_TABLET | Freq: Three times a day (TID) | ORAL | 0 refills | Status: DC | PRN

## 2022-04-09 ENCOUNTER — Ambulatory Visit (INDEPENDENT_AMBULATORY_CARE_PROVIDER_SITE_OTHER): Payer: Medicare HMO | Admitting: Pharmacist

## 2022-04-09 DIAGNOSIS — I779 Disorder of arteries and arterioles, unspecified: Secondary | ICD-10-CM | POA: Diagnosis not present

## 2022-04-09 DIAGNOSIS — Z7901 Long term (current) use of anticoagulants: Secondary | ICD-10-CM

## 2022-04-09 DIAGNOSIS — T82868S Thrombosis of vascular prosthetic devices, implants and grafts, sequela: Secondary | ICD-10-CM | POA: Diagnosis not present

## 2022-04-09 LAB — POCT INR: INR: 2.9 (ref 2.0–3.0)

## 2022-04-09 NOTE — Patient Instructions (Signed)
Patient instructed to take medications as defined in the Anti-coagulation Track section of this encounter.  Patient instructed to take today's dose.  Patient instructed to take  one (1) of your '4mg'$  blue colored warfarin tablets, once-daily at 6PM each day--EXCEPT on FRIDAYS, take only one-half (1/2) of your warfarin tablet on Fridays. Patient verbalized understanding of these instructions.
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
## 2022-04-22 ENCOUNTER — Ambulatory Visit (INDEPENDENT_AMBULATORY_CARE_PROVIDER_SITE_OTHER): Payer: Medicare HMO | Admitting: Pharmacist

## 2022-04-22 DIAGNOSIS — Z7901 Long term (current) use of anticoagulants: Secondary | ICD-10-CM | POA: Diagnosis not present

## 2022-04-22 DIAGNOSIS — T82868S Thrombosis of vascular prosthetic devices, implants and grafts, sequela: Secondary | ICD-10-CM | POA: Diagnosis not present

## 2022-04-22 DIAGNOSIS — I779 Disorder of arteries and arterioles, unspecified: Secondary | ICD-10-CM | POA: Diagnosis not present

## 2022-04-22 LAB — POCT INR: INR: 4.3 — AB (ref 2.0–3.0)

## 2022-04-22 NOTE — Patient Instructions (Signed)
Patient instructed to take medications as defined in the Anti-coagulation Track section of this encounter.  Patient instructed to OMIT today's dose. OMIT dose for today, Thursday, June 8th, 2023 Patient instructed to take one (1) of your '4mg'$  blue colored warfarin tablets, once-daily at [facility] each day--EXCEPT on FRIDAYS, take only one-half (1/2) of your warfarin tablet on Fridays.  Patient verbalized understanding of these instructions.
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
## 2022-04-28 ENCOUNTER — Other Ambulatory Visit: Payer: Self-pay | Admitting: Pharmacist

## 2022-05-06 ENCOUNTER — Ambulatory Visit: Payer: Self-pay

## 2022-05-14 ENCOUNTER — Telehealth: Payer: Self-pay | Admitting: *Deleted

## 2022-05-14 NOTE — Telephone Encounter (Signed)
That's fine

## 2022-05-14 NOTE — Telephone Encounter (Signed)
Call from [place] -wanting to know if it's ok to switch pt's Warfarin manufacturer, [place], to the one thy have in stock, [place]? Thanks

## 2022-05-14 NOTE — Telephone Encounter (Signed)
[place] called and given the ok to change manufacturer per Der Daryll Drown.

## 2022-05-20 ENCOUNTER — Other Ambulatory Visit: Payer: Self-pay

## 2022-05-20 DIAGNOSIS — G8929 Other chronic pain: Secondary | ICD-10-CM

## 2022-05-20 MED ORDER — OXYCODONE-ACETAMINOPHEN 7.5-325 MG PO TABS: 1.0000 | ORAL_TABLET | Freq: Three times a day (TID) | ORAL | 0 refills | Status: DC | PRN

## 2022-05-24 ENCOUNTER — Ambulatory Visit (INDEPENDENT_AMBULATORY_CARE_PROVIDER_SITE_OTHER): Payer: Medicare HMO | Admitting: Pharmacist

## 2022-05-24 ENCOUNTER — Telehealth: Payer: Self-pay | Admitting: Pharmacist

## 2022-05-24 DIAGNOSIS — T82868S Thrombosis of vascular prosthetic devices, implants and grafts, sequela: Secondary | ICD-10-CM | POA: Diagnosis not present

## 2022-05-24 DIAGNOSIS — I779 Disorder of arteries and arterioles, unspecified: Secondary | ICD-10-CM

## 2022-05-24 DIAGNOSIS — Z7901 Long term (current) use of anticoagulants: Secondary | ICD-10-CM

## 2022-05-24 LAB — POCT INR: INR: 3 (ref 2.0–3.0)

## 2022-05-24 MED ORDER — WARFARIN SODIUM 4 MG PO TABS: ORAL_TABLET | ORAL | 2 refills | Status: DC
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
## 2022-05-24 NOTE — Patient Instructions (Signed)
Patient instructed to take medications as defined in the Anti-coagulation Track section of this encounter.  Patient instructed to OMIT today's dose.  Patient instructed to take one-half (1/2) of your '4mg'$  strength, blue-colored warfarin tablets on Tuesdays, Wednesdays, Thursdays and Saturdays. On Sundays, Mondays, and Fridays, take one (1) tablet.  Patient verbalized understanding of these instructions.

## 2022-05-24 NOTE — Telephone Encounter (Signed)
Requests refill authorization on her warfarin. Will send electronically.

## 2022-05-27 NOTE — Progress Notes (Signed)
Evaluation and management procedures were performed by the Clinical Pharmacy Practitioner under my supervision and collaboration. I have reviewed the Practitioner's note and chart, and I agree with the management and plan as documented above. ° °

## 2022-06-03 ENCOUNTER — Other Ambulatory Visit: Payer: Self-pay | Admitting: Internal Medicine

## 2022-06-03 DIAGNOSIS — Z1231 Encounter for screening mammogram for malignant neoplasm of breast: Secondary | ICD-10-CM

## 2022-06-11 ENCOUNTER — Encounter: Payer: Self-pay | Admitting: Internal Medicine

## 2022-06-11 ENCOUNTER — Other Ambulatory Visit: Payer: Self-pay

## 2022-06-11 ENCOUNTER — Ambulatory Visit (INDEPENDENT_AMBULATORY_CARE_PROVIDER_SITE_OTHER): Payer: Medicare HMO | Admitting: Internal Medicine

## 2022-06-11 VITALS — BP 135/77 | HR 51 | Temp 97.6°F | Ht 64.0 in | Wt 106.8 lb

## 2022-06-11 DIAGNOSIS — T82898S Other specified complication of vascular prosthetic devices, implants and grafts, sequela: Secondary | ICD-10-CM

## 2022-06-11 DIAGNOSIS — F331 Major depressive disorder, recurrent, moderate: Secondary | ICD-10-CM | POA: Diagnosis not present

## 2022-06-11 DIAGNOSIS — T82868S Thrombosis of vascular prosthetic devices, implants and grafts, sequela: Secondary | ICD-10-CM

## 2022-06-11 DIAGNOSIS — I10 Essential (primary) hypertension: Secondary | ICD-10-CM

## 2022-06-11 DIAGNOSIS — I5022 Chronic systolic (congestive) heart failure: Secondary | ICD-10-CM | POA: Diagnosis not present

## 2022-06-11 DIAGNOSIS — Z87891 Personal history of nicotine dependence: Secondary | ICD-10-CM

## 2022-06-11 DIAGNOSIS — I110 Hypertensive heart disease with heart failure: Secondary | ICD-10-CM

## 2022-06-11 DIAGNOSIS — R636 Underweight: Secondary | ICD-10-CM

## 2022-06-11 MED ORDER — SERTRALINE HCL 25 MG PO TABS: 25.0000 mg | ORAL_TABLET | Freq: Every day | ORAL | 2 refills | Status: DC

## 2022-06-11 NOTE — Patient Instructions (Addendum)
Thank you for seeing [place] today!  Depression/Panic Attacks Stop taking the Mirtazepine.  Start taking Zoloft. It is important to take Zoloft every day and it may take up to 3-6 weeks to begin taking effect.   Low appetite Nutrition counseling will reach out to you to schedule an appointment.   3.  Low Dose CT Scan The imaging office will reach out to you to schedule your CT scan.   4. Labs We did bloodwork today and we will reach out to you if there are any abnormal results.

## 2022-06-11 NOTE — Assessment & Plan Note (Signed)
Patient has chronic depression that has progressed with anorexia and panic attacks. The timing of her recent increase in symptoms are likely related to the death of her brother. Patient has not been taking Mirtazepine regularly and does not feel it has helped with poor appetite in the past. Her limited food intake is also concerning for possible anemia and dietary deficiencies. PHQ-9 today was 4. We discussed starting an SSRI and the importance of taking it consistently, as well as potential libido-suppressing side effects. Patient reports that she is still sexually active but is amenable to trying an SSRI.  Plan: -Start Sertraline '25mg'$  and discontinue Mirtazepine -CBC -B12 and Folate levels -CMET -Consider increasing to '50mg'$  next visit if tolerating well

## 2022-06-11 NOTE — Assessment & Plan Note (Signed)
Patient has chronic stable systolic heart failure managed with Jardiance, Metoprolol, Entresto, Spironolactone, and Lasix. She reports no side effects on this regimen and denies SOB, DOE, PND, or LE edema. Recent cardiac MRI in 12/2021 showed mild L atrial enlargement, normal LV and RV, LVEF 55% RVEF 56%. No further workup or medication changes today.

## 2022-06-11 NOTE — Assessment & Plan Note (Signed)
Patient reports that she started smoking at age 76 and quit at age 86, with an 8 year gap, smoking 1.5 PPD. This is equivalent to 72 pack years.  Plan: -Low-dose lung CT cancer screening

## 2022-06-11 NOTE — Progress Notes (Signed)
Subjective:   Patient ID: Kristin Knight female   DOB: 1946-06-03 76 y.o.   MRN: [id number]  HPI: Ms.Kristin Knight is a 76 y.o. woman with a pmhx significant for prior tobacco use, HEFrEF and MDD who presents for follow-up of chronic conditions and an acute complaint of poor appetite.   Patient reports that she started having a poor appetite since her brother passed away five months ago, and has not regained it since. She reports that she does not feel hungry and only eats small snacks. She also reports that she has been having 1-2 panic attacks a month for the last few months where she feels anxious and short of breath, and she attempts to alleviate it by walking. She denies anhedonia, low energy, insomnia, or disinterest in hobbies. Her depression was previously managed with Mirtazepine, but patient says that she often forgets to take it.   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 ([place]) 07/06/2019   Occlusion of femoropopliteal bypass graft ([place]) 51/12/5850   Uncomplicated opioid use 77/82/4235   Chronic venous insufficiency 01/31/2015   Chronic midline low back pain without sciatica 10/25/2014   Osteoporosis 04/16/2014   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/23/2013   Internal hemorrhoids 01/26/2013   Diverticulosis 01/26/2013   Seasonal allergies 11/24/2012   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 30 tablet 2   albuterol (VENTOLIN HFA) 108 (90 Base) MCG/ACT inhaler Inhale 2 puffs into the lungs every 6 (six) hours as needed for wheezing or shortness of breath.     atorvastatin (LIPITOR) 40 MG tablet Take 1 tablet by mouth once daily 90 tablet 3   empagliflozin (JARDIANCE) 10 MG TABS tablet Take 1 tablet (10 mg total) by mouth daily before breakfast. 30 tablet 11   furosemide (LASIX) 40 MG tablet Take 1 tablet (40 mg total) by mouth daily. 30 tablet 11   metoprolol succinate (TOPROL-XL) 25 MG 24 hr tablet Take 1 tablet by mouth once daily 90 tablet 3   oxyCODONE-acetaminophen (PERCOCET) 7.5-325 MG tablet Take 1 tablet by mouth every 8 (eight) hours as needed for severe pain. 60 tablet 0   sacubitril-valsartan (ENTRESTO) 97-103 MG Take 1 tablet by mouth 2 (two) times daily. 180 tablet 3   spironolactone (ALDACTONE) 25 MG tablet TAKE 1 TABLET BY MOUTH ONCE DAILY 90 tablet 3   warfarin (COUMADIN) 4 MG tablet Take 1/2 tablet on Tuesdays, Wednesdays, Thursdays and Saturdays. Take one (1) tablet on Sundays, Mondays and Fridays. 20 tablet 2   No current facility-administered medications for this visit.     Review of Systems: Constitutional: positive for anorexia and weight loss, negative for fatigue, fevers, and night sweats Respiratory: negative for dyspnea on exertion Cardiovascular: negative for dyspnea, orthopnea, and paroxysmal nocturnal dyspnea Gastrointestinal: negative for abdominal pain, change in bowel habits, nausea, and vomiting Behavioral/Psych: positive for  panic attacks, negative for bad mood, depression, loss of interest in favorite activities, and sleep disturbance  Objective:   Physical Exam: Vitals:   06/11/22 1000  BP: 135/77  Pulse: (!) 51  Temp: 97.6 F (36.4 C)  TempSrc: Oral  SpO2: 100%  Weight: 106 lb 12.8 oz (48.4 kg)  Height: '5\' 4"'$  (1.626 m)   Physical  Exam Constitutional:      General: She is not in acute distress.    Appearance: Normal appearance.  HENT:     Head: Normocephalic and atraumatic.  Cardiovascular:     Rate and Rhythm: Normal rate and regular rhythm.     Pulses: Normal pulses.     Heart sounds: No murmur heard.    No friction rub. No gallop.  Pulmonary:     Effort: Pulmonary effort is normal.     Breath sounds: Normal breath sounds.  Abdominal:     General: Abdomen is flat. Bowel sounds are normal. There is no distension.     Palpations: Abdomen is soft.     Tenderness: There is no abdominal tenderness. There is no guarding.  Skin:    General: Skin is warm and dry.  Neurological:     Mental Status: She is alert and oriented to person, place, and time.  Psychiatric:        Mood and Affect: Mood normal.        Behavior: Behavior normal.        Thought Content: Thought content normal.        Judgment: Judgment normal.      Assessment & Plan:   Major depressive disorder, recurrent, moderate Patient has chronic depression that has progressed with anorexia and panic attacks. The timing of her recent increase in symptoms are likely related to the death of her brother. Patient has not been taking Mirtazepine regularly and does not feel it has helped with poor appetite in the past. Her limited food intake is also concerning for possible anemia and dietary deficiencies. PHQ-9 today was 4. We discussed starting an SSRI and the importance of taking it consistently, as well as potential libido-suppressing side effects. Patient reports that she is still sexually active but is amenable to trying an SSRI.  Plan: -Start Sertraline '25mg'$  and discontinue Mirtazepine -CBC -B12 and Folate levels -CMET -Consider increasing to '50mg'$  next visit if tolerating well     History of tobacco abuse Patient reports that she started smoking at age 58 and quit at age 72, with an 8 year gap, smoking 1.5 PPD. This is equivalent to 72 pack  years.  Plan: -Low-dose lung CT cancer screening  Chronic systolic heart failure ([place]) Patient has chronic stable systolic heart failure managed with Jardiance, Metoprolol, Entresto, Spironolactone, and Lasix. She reports no side effects on this regimen and denies SOB, DOE, PND, or LE edema. Recent cardiac MRI in 12/2021 showed mild L atrial enlargement, normal LV and RV, LVEF 55% RVEF 56%. No further workup or medication changes today.

## 2022-06-12 LAB — CMP14 + ANION GAP
ALT: 8 IU/L (ref 0–32)
AST: 14 IU/L (ref 0–40)
Albumin/Globulin Ratio: 1.6 (ref 1.2–2.2)
Albumin: 4.7 g/dL (ref 3.8–4.8)
Alkaline Phosphatase: 82 IU/L (ref 44–121)
Anion Gap: 16 mmol/L (ref 10.0–18.0)
BUN/Creatinine Ratio: 10 — ABNORMAL LOW (ref 12–28)
BUN: 11 mg/dL (ref 8–27)
Bilirubin Total: 0.4 mg/dL (ref 0.0–1.2)
CO2: 29 mmol/L (ref 20–29)
Calcium: 10.1 mg/dL (ref 8.7–10.3)
Chloride: 98 mmol/L (ref 96–106)
Creatinine, Ser: 1.06 mg/dL — ABNORMAL HIGH (ref 0.57–1.00)
Globulin, Total: 3 g/dL (ref 1.5–4.5)
Glucose: 104 mg/dL — ABNORMAL HIGH (ref 70–99)
Potassium: 4.8 mmol/L (ref 3.5–5.2)
Sodium: 143 mmol/L (ref 134–144)
Total Protein: 7.7 g/dL (ref 6.0–8.5)
eGFR: 55 mL/min/{1.73_m2} — ABNORMAL LOW (ref >59)

## 2022-06-12 LAB — CBC
Hematocrit: 44.1 % (ref 34.0–46.6)
Hemoglobin: 13.8 g/dL (ref 11.1–15.9)
MCH: 28.2 pg (ref 26.6–33.0)
MCHC: 31.3 g/dL — ABNORMAL LOW (ref 31.5–35.7)
MCV: 90 fL (ref 79–97)
Platelets: 221 10*3/uL (ref 150–450)
RBC: 4.89 x10E6/uL (ref 3.77–5.28)
RDW: 13.1 % (ref 11.7–15.4)
WBC: 3.5 10*3/uL (ref 3.4–10.8)

## 2022-06-12 LAB — VITAMIN B12: Vitamin B-12: 341 pg/mL (ref 232–1245)

## 2022-06-12 LAB — FOLATE: Folate: 10.4 ng/mL (ref >3.0)

## 2022-06-14 ENCOUNTER — Ambulatory Visit: Payer: Medicare HMO

## 2022-06-14 NOTE — Assessment & Plan Note (Signed)
BP is well controlled today at 135/77.  She is doing well on GDMT for her CHF.  Continue this 5 drug regimen

## 2022-06-14 NOTE — Progress Notes (Signed)
Attestation for Student Documentation:  I personally was present and performed or re-performed the history, physical exam and medical decision-making activities of this service and have verified that the service and findings are accurately documented in the student's note.  Sid Falcon, MD 06/14/2022, 11:21 AM

## 2022-06-14 NOTE — Assessment & Plan Note (Signed)
She remains on coumadin and has been following with Dr. Elie Confer in the coumadin clinic.  Check INR based on that clinic recommendation.  No reported bleeding.

## 2022-06-17 ENCOUNTER — Inpatient Hospital Stay: Admission: RE | Admit: 2022-06-17 | Payer: Medicare HMO | Source: Ambulatory Visit

## 2022-06-22 ENCOUNTER — Other Ambulatory Visit: Payer: Self-pay | Admitting: Pharmacist

## 2022-06-23 ENCOUNTER — Telehealth: Payer: Self-pay | Admitting: Dietician

## 2022-06-23 NOTE — Telephone Encounter (Addendum)
Calling about nutrition referral for underweight/unintentional weight loss. Spoke with patient about referral and insurance not covering for her diagnosis. I also made suggestions for increasing calories and protein to help stop weight loss and promote weight gain. (Example- drink half a cup of milk/ensure and half coffee x4 cups rather than 4 cups coffee to reduce caffeine that can reduce appetite and increase calories, eat something  with protein like chicken salad, egg salad, yogurt even if it is a small amount 3 times daily rather than 1 meal per day) written information and coupons for Ensure mailed to patient with dietitian's phone number for questions.  Samples of Ensure given to nurse for when she comes back to the office.

## 2022-06-24 NOTE — Telephone Encounter (Signed)
Thank you!  Great plan and advice.

## 2022-07-12 ENCOUNTER — Other Ambulatory Visit: Payer: Self-pay

## 2022-07-12 DIAGNOSIS — M545 Low back pain, unspecified: Secondary | ICD-10-CM

## 2022-07-12 MED ORDER — OXYCODONE-ACETAMINOPHEN 7.5-325 MG PO TABS: 1.0000 | ORAL_TABLET | Freq: Three times a day (TID) | ORAL | 0 refills | Status: DC | PRN

## 2022-07-12 NOTE — Telephone Encounter (Signed)
oxyCODONE-acetaminophen (PERCOCET) 7.5-325 MG tablet, refill request @ [place] ([place]), [place] - [address]

## 2022-07-12 NOTE — Telephone Encounter (Signed)
Last appointment 06/11/2022.  No future appointments.   No ToxAssure.

## 2022-07-14 ENCOUNTER — Other Ambulatory Visit: Payer: Self-pay | Admitting: Internal Medicine

## 2022-07-20 ENCOUNTER — Ambulatory Visit (INDEPENDENT_AMBULATORY_CARE_PROVIDER_SITE_OTHER): Payer: Medicare HMO | Admitting: Pharmacist

## 2022-07-20 DIAGNOSIS — Z7901 Long term (current) use of anticoagulants: Secondary | ICD-10-CM | POA: Diagnosis not present

## 2022-07-20 DIAGNOSIS — I779 Disorder of arteries and arterioles, unspecified: Secondary | ICD-10-CM

## 2022-07-20 DIAGNOSIS — T82868S Thrombosis of vascular prosthetic devices, implants and grafts, sequela: Secondary | ICD-10-CM

## 2022-07-20 LAB — POCT INR: INR: 2.9 (ref 2.0–3.0)

## 2022-07-20 NOTE — Patient Instructions (Signed)
Patient instructed to take medications as defined in the Anti-coagulation Track section of this encounter.  Patient instructed to take today's dose.  Patient instructed to take 1/2 of your '4mg'$  strength blue warfarin tablet by mouth, once-daily each day.  Patient verbalized understanding of these instructions.
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
## 2022-07-26 ENCOUNTER — Ambulatory Visit: Payer: Medicare HMO

## 2022-08-02 ENCOUNTER — Ambulatory Visit (INDEPENDENT_AMBULATORY_CARE_PROVIDER_SITE_OTHER): Payer: Medicare HMO | Admitting: Pharmacist

## 2022-08-02 DIAGNOSIS — I779 Disorder of arteries and arterioles, unspecified: Secondary | ICD-10-CM

## 2022-08-02 DIAGNOSIS — Z7901 Long term (current) use of anticoagulants: Secondary | ICD-10-CM | POA: Diagnosis not present

## 2022-08-02 DIAGNOSIS — T82868S Thrombosis of vascular prosthetic devices, implants and grafts, sequela: Secondary | ICD-10-CM

## 2022-08-02 LAB — POCT INR: INR: 2.1 (ref 2.0–3.0)
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
## 2022-08-02 NOTE — Patient Instructions (Signed)
Patient instructed to take medications as defined in the Anti-coagulation Track section of this encounter.  Patient instructed to take today's dose.  Patient instructed to take one-half (1/2) of your '4mg'$  strength, blue-colored warfarin tablets by mouth, once-daily--EXCEPT on Mondays, Wednesdays and Fridays--take one (1) tablet on these days.   Patient verbalized understanding of these instructions.

## 2022-08-06 ENCOUNTER — Ambulatory Visit: Payer: Medicare HMO

## 2022-08-17 ENCOUNTER — Other Ambulatory Visit: Payer: Self-pay | Admitting: Internal Medicine

## 2022-08-17 DIAGNOSIS — G8929 Other chronic pain: Secondary | ICD-10-CM

## 2022-08-17 NOTE — Telephone Encounter (Signed)
Refill Request  oxyCODONE-acetaminophen (PERCOCET) 7.5-325 MG tablet  [facility] - [place] ([place]), [place] - [address]

## 2022-08-17 NOTE — Telephone Encounter (Signed)
Last rx written 07/12/22. Last OV 06/11/22. Next OV - has not been scheduled. Tox 02/17/18.

## 2022-08-18 MED ORDER — OXYCODONE-ACETAMINOPHEN 7.5-325 MG PO TABS: 1.0000 | ORAL_TABLET | Freq: Three times a day (TID) | ORAL | 0 refills | Status: DC | PRN

## 2022-08-30 ENCOUNTER — Ambulatory Visit (INDEPENDENT_AMBULATORY_CARE_PROVIDER_SITE_OTHER): Payer: Medicare HMO | Admitting: Pharmacist

## 2022-08-30 DIAGNOSIS — T82868S Thrombosis of vascular prosthetic devices, implants and grafts, sequela: Secondary | ICD-10-CM

## 2022-08-30 DIAGNOSIS — Z7901 Long term (current) use of anticoagulants: Secondary | ICD-10-CM

## 2022-08-30 DIAGNOSIS — I779 Disorder of arteries and arterioles, unspecified: Secondary | ICD-10-CM

## 2022-08-30 LAB — POCT INR: INR: 2.1 (ref 2.0–3.0)

## 2022-08-30 MED ORDER — WARFARIN SODIUM 4 MG PO TABS: ORAL_TABLET | ORAL | 2 refills | Status: DC

## 2022-08-30 NOTE — Patient Instructions (Signed)
Patient instructed to take medications as defined in the Anti-coagulation Track section of this encounter.  Patient instructed to take today's dose.  Patient instructed to take  one (1) of your '4mg'$  strength-blue warfarin tablets, Monday through Friday. Take only one-half (1/2) tablet on Saturdays and Sundays.  Patient verbalized understanding of these instructions.
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
## 2022-08-30 NOTE — Progress Notes (Signed)
INTERNAL MEDICINE TEACHING ATTENDING ADDENDUM   I agree with pharmacy recommendations as outlined in their note.   Braleigh Massoud, MD  

## 2022-09-13 ENCOUNTER — Other Ambulatory Visit: Payer: Self-pay | Admitting: Internal Medicine

## 2022-09-22 ENCOUNTER — Other Ambulatory Visit (HOSPITAL_COMMUNITY): Payer: Self-pay | Admitting: Adult Health

## 2022-09-23 ENCOUNTER — Other Ambulatory Visit (HOSPITAL_COMMUNITY): Payer: Self-pay

## 2022-09-23 MED ORDER — EMPAGLIFLOZIN 10 MG PO TABS: 10.0000 mg | ORAL_TABLET | Freq: Every day | ORAL | 3 refills | Status: DC

## 2022-09-27 ENCOUNTER — Ambulatory Visit (INDEPENDENT_AMBULATORY_CARE_PROVIDER_SITE_OTHER): Payer: Medicare HMO | Admitting: Pharmacist

## 2022-09-27 DIAGNOSIS — T82868D Thrombosis of vascular prosthetic devices, implants and grafts, subsequent encounter: Secondary | ICD-10-CM | POA: Diagnosis not present

## 2022-09-27 DIAGNOSIS — I779 Disorder of arteries and arterioles, unspecified: Secondary | ICD-10-CM | POA: Diagnosis not present

## 2022-09-27 DIAGNOSIS — Z7901 Long term (current) use of anticoagulants: Secondary | ICD-10-CM | POA: Diagnosis not present

## 2022-09-27 LAB — POCT INR: INR: 3.9 — AB (ref 2.0–3.0)

## 2022-09-27 MED ORDER — WARFARIN SODIUM 4 MG PO TABS: ORAL_TABLET | ORAL | 2 refills | Status: DC
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
## 2022-09-27 NOTE — Patient Instructions (Signed)
Patient instructed to take medications as defined in the Anti-coagulation Track section of this encounter.  Patient instructed to take today's dose.  Patient instructed to take one (1) of your '4mg'$  strength-blue warfarin tablets on Mondays, Wednesdays and Fridays. Take only one-half (1/2) tablet on Sundays, Tuesdays, Thursdays and Saturdays.  Patient verbalized understanding of these instructions.

## 2022-10-22 ENCOUNTER — Ambulatory Visit (INDEPENDENT_AMBULATORY_CARE_PROVIDER_SITE_OTHER): Payer: Medicare HMO | Admitting: *Deleted

## 2022-10-22 ENCOUNTER — Encounter: Payer: Self-pay | Admitting: *Deleted

## 2022-10-22 ENCOUNTER — Ambulatory Visit (INDEPENDENT_AMBULATORY_CARE_PROVIDER_SITE_OTHER): Payer: Medicare HMO | Admitting: Internal Medicine

## 2022-10-22 ENCOUNTER — Ambulatory Visit (HOSPITAL_COMMUNITY)
Admission: RE | Admit: 2022-10-22 | Discharge: 2022-10-22 | Disposition: A | Discharge status: Home / Self Care | Payer: Medicare HMO | Source: Ambulatory Visit | Attending: Internal Medicine | Admitting: Internal Medicine

## 2022-10-22 VITALS — BP 143/73 | HR 53 | Temp 98.1°F | Wt 99.2 lb

## 2022-10-22 VITALS — BP 143/73 | HR 53 | Temp 98.1°F | Wt 99.1 lb

## 2022-10-22 DIAGNOSIS — M545 Low back pain, unspecified: Secondary | ICD-10-CM

## 2022-10-22 DIAGNOSIS — Z87891 Personal history of nicotine dependence: Secondary | ICD-10-CM | POA: Insufficient documentation

## 2022-10-22 DIAGNOSIS — Z0000 Encounter for general adult medical examination without abnormal findings: Secondary | ICD-10-CM | POA: Diagnosis not present

## 2022-10-22 DIAGNOSIS — I5022 Chronic systolic (congestive) heart failure: Secondary | ICD-10-CM

## 2022-10-22 DIAGNOSIS — Z23 Encounter for immunization: Secondary | ICD-10-CM

## 2022-10-22 DIAGNOSIS — R634 Abnormal weight loss: Secondary | ICD-10-CM | POA: Diagnosis not present

## 2022-10-22 DIAGNOSIS — T82868D Thrombosis of vascular prosthetic devices, implants and grafts, subsequent encounter: Secondary | ICD-10-CM

## 2022-10-22 DIAGNOSIS — T82868S Thrombosis of vascular prosthetic devices, implants and grafts, sequela: Secondary | ICD-10-CM

## 2022-10-22 DIAGNOSIS — I110 Hypertensive heart disease with heart failure: Secondary | ICD-10-CM

## 2022-10-22 DIAGNOSIS — I779 Disorder of arteries and arterioles, unspecified: Secondary | ICD-10-CM

## 2022-10-22 DIAGNOSIS — F331 Major depressive disorder, recurrent, moderate: Secondary | ICD-10-CM

## 2022-10-22 DIAGNOSIS — Z0100 Encounter for examination of eyes and vision without abnormal findings: Secondary | ICD-10-CM

## 2022-10-22 DIAGNOSIS — G8929 Other chronic pain: Secondary | ICD-10-CM

## 2022-10-22 DIAGNOSIS — M810 Age-related osteoporosis without current pathological fracture: Secondary | ICD-10-CM

## 2022-10-22 DIAGNOSIS — I10 Essential (primary) hypertension: Secondary | ICD-10-CM

## 2022-10-22 LAB — GLUCOSE, CAPILLARY: Glucose-Capillary: 120 mg/dL — ABNORMAL HIGH (ref 70–99)

## 2022-10-22 LAB — POCT GLYCOSYLATED HEMOGLOBIN (HGB A1C): Hemoglobin A1C: 6.2 % — AB (ref 4.0–5.6)

## 2022-10-22 MED ORDER — OXYCODONE-ACETAMINOPHEN 7.5-325 MG PO TABS: 1.0000 | ORAL_TABLET | Freq: Three times a day (TID) | ORAL | 0 refills | Status: DC | PRN

## 2022-10-22 MED ORDER — ALENDRONATE SODIUM 70 MG PO TABS: 70.0000 mg | ORAL_TABLET | ORAL | 3 refills | Status: AC

## 2022-10-22 MED ORDER — SERTRALINE HCL 50 MG PO TABS: 50.0000 mg | ORAL_TABLET | Freq: Every day | ORAL | 3 refills | Status: DC
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
## 2022-10-22 NOTE — Assessment & Plan Note (Addendum)
Patient has been taking sertraline 25 mg daily since July 2023 with no issue. She reports that her appetite has slightly improved since beginning this therapy. She reports her stress levels are manageable with no recent significant increase in stress. PHQ-9 score today at 5.  Plan -Increase sertraline 25 mg daily to 50 mg daily - increase in dose may continue to help her appetite

## 2022-10-22 NOTE — Patient Instructions (Addendum)
Kristin Knight,  It was great to see you in clinic today.  Below is what we discussed today: Weight Loss: Lab work today and we have ordered a chest x-ray for you Osteoporosis: We will start you on Fosamax to help with your bone density to prevent fractures Depression: Increasing sertraline 25 mg dose daily to 50 mg  Please note the following changes to your medications: - Increase sertraline 25 mg to 50 mg - Start Fosamax 70 mg one tablet weekly   It is a pleasure to be a part of your team, thank you for allowing [place] to be a part of your care,  Max and Dr. Daryll Drown  If you need medication refills please notify your pharmacy one week in advance and they will send [place] a request.

## 2022-10-22 NOTE — Assessment & Plan Note (Addendum)
Patient has mild left lower extremity edema, most likely related to venous insufficiency. She reports the swelling is typically worst at the end of her work day. She denies cough or shortness of breath. She reports that she sleeps on 1 pillow at night with no issues. No concern for acute heart failure exacerbation today.  Plan -Continue Jardiance 10 mg, Metoprolol 25 mg, Entresto 97-103 mg, Spironolactone 25 mg daily -Continue Lasix 40 mg daily

## 2022-10-22 NOTE — Assessment & Plan Note (Addendum)
-  F/u lipid panel obtained today -CT chest lung cancer screen and screening mammogram are ordered - patient reports she has been contacted about mammogram scheduling

## 2022-10-22 NOTE — Assessment & Plan Note (Addendum)
Patient has lost about 7.2% weight since July 2023 and about 18% since January 2023. She reports this weight loss as unintentional. In July, she was experiencing MDD and was started on sertraline 25 mg. Her appetite was significantly decreased at that time - since starting sertraline therapy in July, she reports her appetite has been slightly improved, but she is still concerned about her weight loss.   Assessment:  Patient's weight loss of 7.2% weight since July 2023 and about 18% since January 2023 is concerning. She is thin-appearing on exam with mild temporal and thenar eminence wasting bilaterally. Her decreased appetite could be related to her MDD, but she reports her recent stress levels are manageable and her appetite has mildly improved since starting sertraline in July. Given her 72-pack year tobacco use history, her weight loss could be related to a malignant process. However, she denies hemoptysis or shortness of breath. Could also be hyperthyroidism, however her thyroid exam was normal today and she denies other hyperthyroid symptoms such as diarrhea. Her last TSH, T3, and free T4 on 09/18/21 were WNL. Will continue working up her unintentional weight loss with lab work and chest x-ray.  Plan -F/u CBC, ESR, CRP, urinalysis obtained today -F/u TSH, T3, and free T4 obtained today -F/u chest x-ray ordered today - depending on x-ray results can also consider chest CT -Follow-up in 1 month
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
## 2022-10-22 NOTE — Patient Instructions (Signed)

## 2022-10-22 NOTE — Assessment & Plan Note (Addendum)
BP re-check at 143/73, which is slightly over goal. Continue her GDMT for chronic heart failure. No signs of acute HF exacerbation today.  Plan -F/u BMP obtained today

## 2022-10-23 LAB — URINALYSIS, ROUTINE W REFLEX MICROSCOPIC
Bilirubin, UA: NEGATIVE
Ketones, UA: NEGATIVE
Leukocytes,UA: NEGATIVE
Nitrite, UA: NEGATIVE
Protein,UA: NEGATIVE
RBC, UA: NEGATIVE
Specific Gravity, UA: 1.022 (ref 1.005–1.030)
Urobilinogen, Ur: 1 mg/dL (ref 0.2–1.0)
pH, UA: 7.5 (ref 5.0–7.5)

## 2022-10-25 ENCOUNTER — Ambulatory Visit (INDEPENDENT_AMBULATORY_CARE_PROVIDER_SITE_OTHER): Payer: Medicare HMO | Admitting: Pharmacist

## 2022-10-25 ENCOUNTER — Telehealth: Payer: Self-pay | Admitting: *Deleted

## 2022-10-25 DIAGNOSIS — T82868S Thrombosis of vascular prosthetic devices, implants and grafts, sequela: Secondary | ICD-10-CM

## 2022-10-25 DIAGNOSIS — T82868D Thrombosis of vascular prosthetic devices, implants and grafts, subsequent encounter: Secondary | ICD-10-CM | POA: Diagnosis not present

## 2022-10-25 DIAGNOSIS — I779 Disorder of arteries and arterioles, unspecified: Secondary | ICD-10-CM

## 2022-10-25 DIAGNOSIS — Z23 Encounter for immunization: Secondary | ICD-10-CM

## 2022-10-25 DIAGNOSIS — Z7901 Long term (current) use of anticoagulants: Secondary | ICD-10-CM

## 2022-10-25 LAB — LIPID PANEL
Chol/HDL Ratio: 1.9 ratio (ref 0.0–4.4)
Cholesterol, Total: 138 mg/dL (ref 100–199)
HDL: 72 mg/dL (ref >39)
LDL Chol Calc (NIH): 50 mg/dL (ref 0–99)
Triglycerides: 81 mg/dL (ref 0–149)
VLDL Cholesterol Cal: 16 mg/dL (ref 5–40)

## 2022-10-25 LAB — CBC WITH DIFFERENTIAL/PLATELET
Basophils Absolute: 0 10*3/uL (ref 0.0–0.2)
Basos: 1 %
EOS (ABSOLUTE): 0 10*3/uL (ref 0.0–0.4)
Eos: 1 %
Hematocrit: 39.7 % (ref 34.0–46.6)
Hemoglobin: 13.4 g/dL (ref 11.1–15.9)
Immature Grans (Abs): 0 10*3/uL (ref 0.0–0.1)
Immature Granulocytes: 0 %
Lymphocytes Absolute: 1.2 10*3/uL (ref 0.7–3.1)
Lymphs: 26 %
MCH: 30.3 pg (ref 26.6–33.0)
MCHC: 33.8 g/dL (ref 31.5–35.7)
MCV: 90 fL (ref 79–97)
Monocytes Absolute: 0.3 10*3/uL (ref 0.1–0.9)
Monocytes: 6 %
Neutrophils Absolute: 3.1 10*3/uL (ref 1.4–7.0)
Neutrophils: 66 %
Platelets: 217 10*3/uL (ref 150–450)
RBC: 4.42 x10E6/uL (ref 3.77–5.28)
RDW: 12.8 % (ref 11.7–15.4)
WBC: 4.6 10*3/uL (ref 3.4–10.8)

## 2022-10-25 LAB — BMP8+ANION GAP
Anion Gap: 18 mmol/L (ref 10.0–18.0)
BUN/Creatinine Ratio: 10 — ABNORMAL LOW (ref 12–28)
BUN: 10 mg/dL (ref 8–27)
CO2: 25 mmol/L (ref 20–29)
Calcium: 9.8 mg/dL (ref 8.7–10.3)
Chloride: 100 mmol/L (ref 96–106)
Creatinine, Ser: 1 mg/dL (ref 0.57–1.00)
Glucose: 115 mg/dL — ABNORMAL HIGH (ref 70–99)
Potassium: 3.7 mmol/L (ref 3.5–5.2)
Sodium: 143 mmol/L (ref 134–144)
eGFR: 59 mL/min/{1.73_m2} — ABNORMAL LOW (ref >59)

## 2022-10-25 LAB — POCT INR: INR: 2.7 (ref 2.0–3.0)

## 2022-10-25 LAB — T4, FREE: Free T4: 1.45 ng/dL (ref 0.82–1.77)

## 2022-10-25 LAB — SEDIMENTATION RATE: Sed Rate: 9 mm/hr (ref 0–40)

## 2022-10-25 LAB — T3: T3, Total: 120 ng/dL (ref 71–180)

## 2022-10-25 LAB — C-REACTIVE PROTEIN: CRP: <1 mg/L (ref 0–10)

## 2022-10-25 LAB — TSH: TSH: 0.399 u[IU]/mL — ABNORMAL LOW (ref 0.450–4.500)
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
## 2022-10-25 NOTE — Telephone Encounter (Signed)
Patient inquired about her prescription for Percocet.  Spoke with Pharmacist [facility] has new policy that if patient's remain on pain medication for long periods of time a referral to pain management.

## 2022-10-25 NOTE — Patient Instructions (Signed)
Patient instructed to take medications as defined in the Anti-coagulation Track section of this encounter.  Patient instructed to take today's dose.  Patient instructed to take one (1) of your '4mg'$  strength-blue warfarin tablets on Mondays, Wednesdays and Fridays. Take only one-half (1/2) tablet on Sundays, Tuesdays, Thursdays and Saturdays. Patient verbalized understanding of these instructions.

## 2022-10-27 LAB — TOXASSURE SELECT,+ANTIDEPR,UR

## 2022-10-29 NOTE — Progress Notes (Signed)
Attestation for Student Documentation:  I personally was present and performed or re-performed the history, physical exam and medical decision-making activities of this service and have verified that the service and findings are accurately documented in the student's note.  Sid Falcon, MD 10/29/2022, 8:30 AM

## 2022-10-29 NOTE — Telephone Encounter (Signed)
Called pt to see if she was able to get rx (in order to close out encounter) No answer-message left on recorder.   Also called [facility] @ [phone] States pt did pick up rx on 12/11, but will need a pain clinic referral for next refill  CMA asked to speak to pharmacist and asked is this a store policy and was informed that it is up to the discretion of the pharmacist as they did not feel comfortable with continued long-term prescribing of percocet.

## 2022-10-29 NOTE — Telephone Encounter (Signed)
Spoke with Pharmist at [facility].  [phone]

## 2022-11-01 NOTE — Telephone Encounter (Signed)
Called and spoke to [name], confirmed identity with name and DOB.   Advised her that refill should be done.  If she finds difficulty getting her medication, she should call the clinic and we will transfer the Rx to [facility] on [address] in [place].  Gilles Chiquito, MD

## 2022-11-01 NOTE — Telephone Encounter (Signed)
Called and spoke to pharmacist.  He reports that Kristin Knight has a corporate policy to recommend referral to pain management when patients are on long term stable doses of narcotics to avoid getting "sued."  I advised him that Kristin Knight has been on long standing dose of medication, no changes and has tried alternatives. She has been under my care for years.  He reported that he would put this conversation on record, however, she would likely not be filled in the future despite this.  I will advise Kristin Knight to no longer fill at [facility] if she has other options. I asked him to fill this prescription.  Gilles Chiquito, MD

## 2022-11-20 IMAGING — MR MR CARD MORPHOLOGY WO/W CM
45 of 48 series · 45 of 48 positions shown · IV contrast (Contrast agent)
Comparison: none

CLINICAL DATA: Cardiomyopathy of uncertain etiology

EXAM:
CARDIAC MRI
TECHNIQUE: The patient was scanned on a 1.5 Tesla GE magnet. A dedicated
cardiac coil was used. Functional imaging was done using [employer]iest[employer]
sequences. [DATE], and 4 chamber views were done to assess for RWMA's.
Modified Loosz rule using a short axis stack was used to
calculate an ejection fraction on a dedicated work station using
[employer] software. The patient received 7 cc of Gadavist. After 10
minutes inversion recovery sequences were used to assess for
infiltration and scar tissue.
CONTRAST:  Gadavist 7 cc

[Series 4: [id number]_bh · axial · 8.0mm · 1.41mm/px · 1 of 16 slices shown]
[im 1/16]
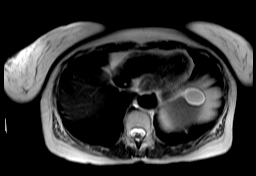

[Series 8: bSSFP · oblique · 8.0mm · 1.61mm/px · 1 of 25 slices shown (1 of 20)]
[im 1/25]
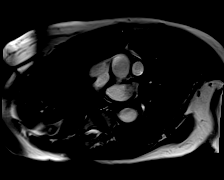

[Series 9: bSSFP · oblique · 8.0mm · 1.61mm/px · 1 of 25 slices shown (2 of 20)]
[im 1/25]
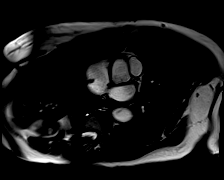

[Series 10: bSSFP · oblique · 8.0mm · 1.61mm/px · 1 of 25 slices shown (3 of 20)]
[im 1/25]
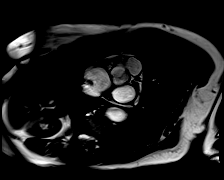

[Series 11: bSSFP · oblique · 8.0mm · 1.61mm/px · 1 of 25 slices shown (4 of 20)]
[im 1/25]
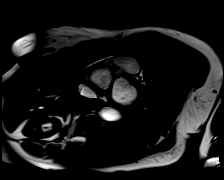

[Series 12: bSSFP · oblique · 8.0mm · 1.61mm/px · 1 of 25 slices shown (5 of 20)]
[im 1/25]
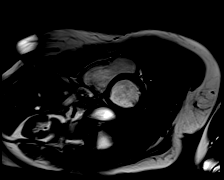

[Series 13: bSSFP · oblique · 8.0mm · 1.61mm/px · 1 of 25 slices shown (6 of 20)]
[im 1/25]
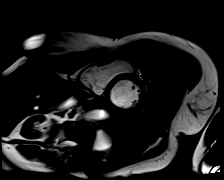

[Series 14: bSSFP · oblique · 8.0mm · 1.61mm/px · 1 of 25 slices shown (7 of 20)]
[im 1/25]
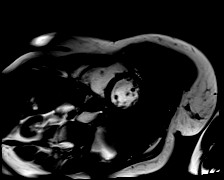

[Series 15: bSSFP · oblique · 8.0mm · 1.61mm/px · 1 of 25 slices shown (8 of 20)]
[im 1/25]
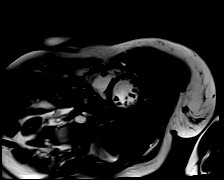

[Series 16: bSSFP · oblique · 8.0mm · 1.61mm/px · 1 of 25 slices shown (9 of 20)]
[im 1/25]
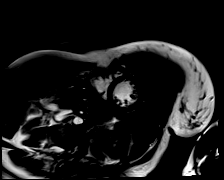

[Series 17: bSSFP · oblique · 8.0mm · 1.61mm/px · 1 of 25 slices shown (10 of 20)]
[im 1/25]
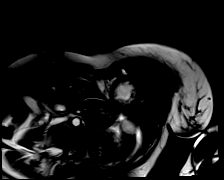

[Series 18: bSSFP · oblique · 8.0mm · 1.61mm/px · 1 of 25 slices shown (11 of 20)]
[im 1/25]
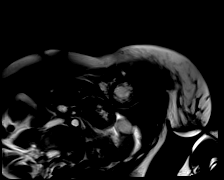

[Series 19: bSSFP · oblique · 8.0mm · 1.61mm/px · 1 of 25 slices shown (12 of 20)]
[im 1/25]
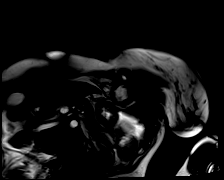

[Series 20: bSSFP · oblique · 8.0mm · 1.61mm/px · 1 of 25 slices shown (13 of 20)]
[im 1/25]
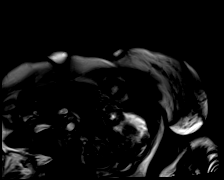

[Series 21: bSSFP · oblique · 8.0mm · 1.61mm/px · 1 of 25 slices shown (14 of 20)]
[im 1/25]
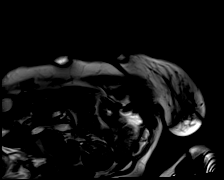

[Series 22: bSSFP · oblique · 8.0mm · 1.61mm/px · 1 of 25 slices shown (15 of 20)]
[im 1/25]
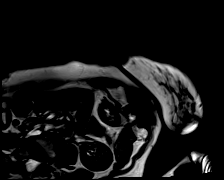

[Series 23: bSSFP · oblique · 8.0mm · 1.61mm/px · 1 of 25 slices shown (16 of 20)]
[im 1/25]
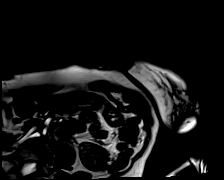

[Series 24: bSSFP · oblique · 6.0mm · 1.41mm/px · 1 of 25 slices shown (17 of 20)]
[im 1/25]
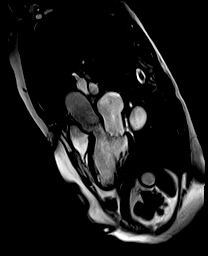

[Series 25: bSSFP · coronal · 6.0mm · 1.41mm/px · 1 of 25 slices shown (18 of 20)]
[im 1/25]
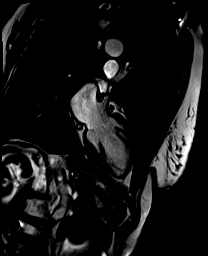

[Series 26: bSSFP · oblique · 6.0mm · 1.41mm/px · 1 of 25 slices shown (19 of 20)]
[im 1/25]
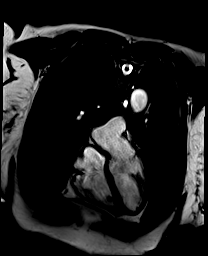

[Series 27: (id)_long_t1 · oblique · 8.0mm · 1.56mm/px · 1 of 24 slices shown]
[im 1/24]
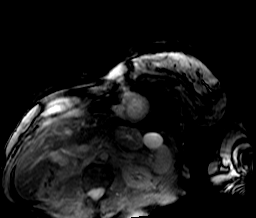

[Series 28: (id)_long_t1_moco · oblique · 8.0mm · 1.56mm/px · 1 of 24 slices shown]
[im 1/24]
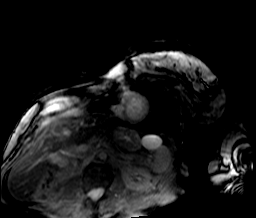

[Series 29: (id)_long_t1_moco_t1 · oblique · 8.0mm · 1.56mm/px · 1 of 6 slices shown]
[im 1/6]
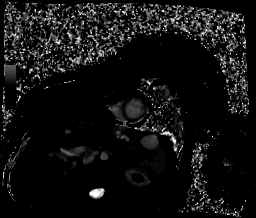

[Series 31: (id)_trufi · oblique · 8.0mm · 2.08mm/px · 1 of 9 slices shown]
[im 1/9]
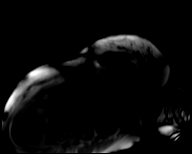

[Series 32: (id)_trufi_moco · oblique · 8.0mm · 2.08mm/px · 1 of 9 slices shown]
[im 1/9]
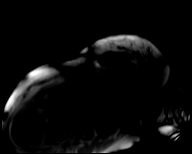

[Series 33: (id)_trufi_moco_t2 · oblique · 8.0mm · 2.08mm/px · 1 of 3 slices shown]
[im 1/3]
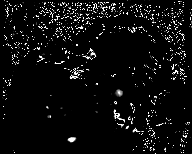

[Series 35: pre short axis · oblique · non-contrast · 8.0mm · 2.25mm/px · 1 of 10 slices shown (1 of 6)]
[im 1/10]
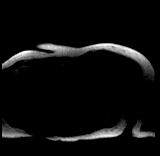

[Series 36: pre short axis · oblique · non-contrast · 8.0mm · 2.25mm/px · 1 of 10 slices shown (2 of 6)]
[im 1/10]
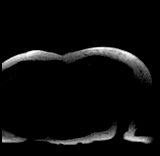

[Series 37: pre short axis · oblique · non-contrast · 8.0mm · 2.25mm/px · 1 of 10 slices shown (3 of 6)]
[im 1/10]
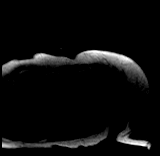

[Series 38: pre short axis · oblique · non-contrast · 8.0mm · 2.25mm/px · 1 of 10 slices shown (4 of 6)]
[im 1/10]
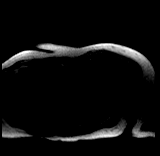

[Series 39: pre short axis · oblique · non-contrast · 8.0mm · 2.25mm/px · 1 of 10 slices shown (5 of 6)]
[im 1/10]
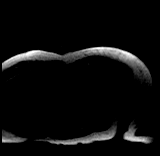

[Series 40: pre short axis · oblique · non-contrast · 8.0mm · 2.25mm/px · 1 of 10 slices shown (6 of 6)]
[im 1/10]
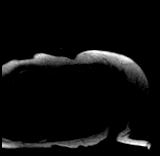

[Series 41: rest short axis · oblique · 8.0mm · 2.25mm/px · 1 of 60 slices shown (1 of 6)]
[im 1/60]
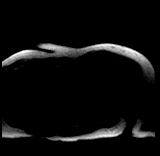

[Series 42: rest short axis · oblique · 8.0mm · 2.25mm/px · 1 of 60 slices shown (2 of 6)]
[im 1/60]
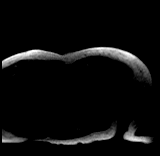

[Series 43: rest short axis · oblique · 8.0mm · 2.25mm/px · 1 of 60 slices shown (3 of 6)]
[im 1/60]
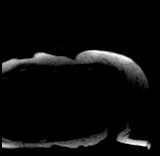

[Series 44: rest short axis · oblique · 8.0mm · 2.25mm/px · 1 of 60 slices shown (4 of 6)]
[im 1/60]
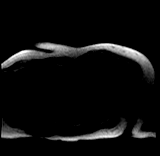

[Series 45: rest short axis · oblique · 8.0mm · 2.25mm/px · 1 of 60 slices shown (5 of 6)]
[im 1/60]
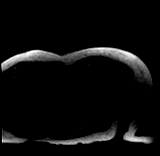

[Series 46: rest short axis · oblique · 8.0mm · 2.25mm/px · 1 of 60 slices shown (6 of 6)]
[im 1/60]
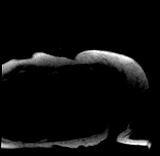

[Series 47: bSSFP · coronal · 6.0mm · 1.41mm/px · 1 of 25 slices shown (20 of 20)]
[im 1/25]
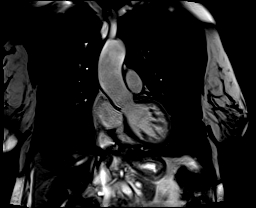

[Series 48: cine rvit · coronal · 6.0mm · 1.41mm/px · 1 of 25 slices shown]
[im 1/25]
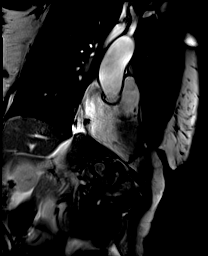

[Series 49: aortic valve cine · oblique · 6.0mm · 1.41mm/px · 1 of 25 slices shown]
[im 1/25]
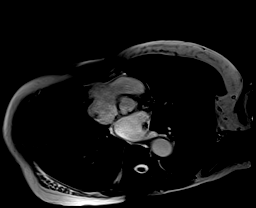

[Series 50: cine rvot · sagittal · 6.0mm · 1.41mm/px · 1 of 25 slices shown]
[im 1/25]
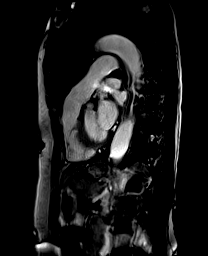

[Series 52: lge_single shot sa · oblique · 8.0mm · 2.08mm/px · 1 of 16 slices shown (1 of 2)]
[im 1/16]
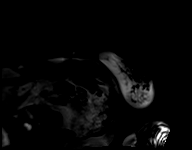

[Series 53: lge_single shot sa · oblique · 8.0mm · 2.08mm/px · 1 of 16 slices shown (2 of 2)]
[im 1/16]
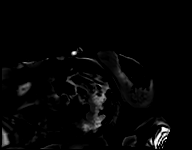

[Series 62: (id)_short_t1 · oblique · 8.0mm · 1.56mm/px · 1 of 27 slices shown]
[im 1/27]
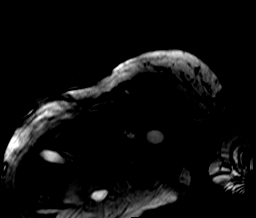

[45 of 48 positions shown; findings below may reference images not displayed]

FINDINGS: Limited images of the lung fields showed no gross abnormalities.

Normal left ventricular size and wall thickness. Normal wall motion
with EF 55%. Normal right ventricular size and systolic function, EF
56%. Mild left atrial enlargement. Normal right atrium. No
significant mitral regurgitation. Mild to moderate tricuspid
regurgitation. Trileaflet aortic valve, no significant stenosis or
regurgitation.

On delayed enhancement imaging, there was no myocardial late
gadolinium enhancement (LGE).

MEASUREMENTS:
MEASUREMENTS
LVEDV 115 mL

LVSV 64 mL

LVEF 55%

RVEDV 108 mL
RVSV 61 mL
RVEF 56%
IMPRESSION: 1.  Normal LV size and systolic function, LV EF 55%.

2.  Normal RV size and systolic function, RV EF 56%.

3. No myocardial LGE, so no definitive evidence for prior MI,
infiltrative disease, or myocarditis.

Dez Amoroso

## 2022-11-22 ENCOUNTER — Encounter: Payer: Self-pay | Admitting: Internal Medicine

## 2022-11-22 ENCOUNTER — Ambulatory Visit (INDEPENDENT_AMBULATORY_CARE_PROVIDER_SITE_OTHER): Payer: Medicare HMO | Admitting: Pharmacist

## 2022-11-22 DIAGNOSIS — T82868D Thrombosis of vascular prosthetic devices, implants and grafts, subsequent encounter: Secondary | ICD-10-CM | POA: Diagnosis not present

## 2022-11-22 DIAGNOSIS — Z7901 Long term (current) use of anticoagulants: Secondary | ICD-10-CM

## 2022-11-22 DIAGNOSIS — I779 Disorder of arteries and arterioles, unspecified: Secondary | ICD-10-CM

## 2022-11-22 DIAGNOSIS — T82868S Thrombosis of vascular prosthetic devices, implants and grafts, sequela: Secondary | ICD-10-CM

## 2022-11-22 LAB — POCT INR: INR: 2.2 (ref 2.0–3.0)

## 2022-11-22 NOTE — Patient Instructions (Signed)
Patient instructed to take medications as defined in the Anti-coagulation Track section of this encounter.  Patient instructed to take today's dose.  Patient instructed to take one (1) tablet of your 4 milligram strength, blue warfarin tablets on Mondays, Tuesdays, Wednesdays, Fridays and Saturdays. On Thursdays, and Sundays, take only one-half (1/2) tablet. Patient verbalized understanding of these instructions.
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
## 2022-11-26 ENCOUNTER — Encounter: Payer: Medicare HMO | Admitting: Internal Medicine

## 2022-12-10 ENCOUNTER — Encounter: Payer: Medicare HMO | Admitting: Internal Medicine

## 2022-12-11 ENCOUNTER — Observation Stay (HOSPITAL_COMMUNITY)
Admission: EM | Admit: 2022-12-11 | Discharge: 2022-12-12 | Disposition: A | Discharge status: Home / Self Care | Payer: Medicare HMO | Attending: Infectious Diseases | Admitting: Infectious Diseases

## 2022-12-11 ENCOUNTER — Emergency Department (HOSPITAL_COMMUNITY): Payer: Medicare HMO

## 2022-12-11 ENCOUNTER — Other Ambulatory Visit: Payer: Self-pay

## 2022-12-11 ENCOUNTER — Encounter (HOSPITAL_COMMUNITY): Payer: Self-pay

## 2022-12-11 DIAGNOSIS — Z7901 Long term (current) use of anticoagulants: Secondary | ICD-10-CM | POA: Insufficient documentation

## 2022-12-11 DIAGNOSIS — Z7984 Long term (current) use of oral hypoglycemic drugs: Secondary | ICD-10-CM | POA: Insufficient documentation

## 2022-12-11 DIAGNOSIS — Z1152 Encounter for screening for COVID-19: Secondary | ICD-10-CM | POA: Diagnosis not present

## 2022-12-11 DIAGNOSIS — R0602 Shortness of breath: Secondary | ICD-10-CM | POA: Diagnosis present

## 2022-12-11 DIAGNOSIS — M549 Dorsalgia, unspecified: Secondary | ICD-10-CM | POA: Diagnosis not present

## 2022-12-11 DIAGNOSIS — Z87891 Personal history of nicotine dependence: Secondary | ICD-10-CM

## 2022-12-11 DIAGNOSIS — J449 Chronic obstructive pulmonary disease, unspecified: Secondary | ICD-10-CM | POA: Insufficient documentation

## 2022-12-11 DIAGNOSIS — G8929 Other chronic pain: Secondary | ICD-10-CM | POA: Diagnosis not present

## 2022-12-11 DIAGNOSIS — I110 Hypertensive heart disease with heart failure: Secondary | ICD-10-CM | POA: Insufficient documentation

## 2022-12-11 DIAGNOSIS — I5023 Acute on chronic systolic (congestive) heart failure: Secondary | ICD-10-CM | POA: Diagnosis not present

## 2022-12-11 DIAGNOSIS — R0600 Dyspnea, unspecified: Secondary | ICD-10-CM | POA: Diagnosis not present

## 2022-12-11 DIAGNOSIS — Z79899 Other long term (current) drug therapy: Secondary | ICD-10-CM | POA: Diagnosis not present

## 2022-12-11 DIAGNOSIS — I5021 Acute systolic (congestive) heart failure: Secondary | ICD-10-CM | POA: Diagnosis not present

## 2022-12-11 DIAGNOSIS — I509 Heart failure, unspecified: Secondary | ICD-10-CM

## 2022-12-11 LAB — CBC
HCT: 40.1 % (ref 36.0–46.0)
Hemoglobin: 11.8 g/dL — ABNORMAL LOW (ref 12.0–15.0)
MCH: 29.1 pg (ref 26.0–34.0)
MCHC: 29.4 g/dL — ABNORMAL LOW (ref 30.0–36.0)
MCV: 98.8 fL (ref 80.0–100.0)
Platelets: 168 10*3/uL (ref 150–400)
RBC: 4.06 MIL/uL (ref 3.87–5.11)
RDW: 14.5 % (ref 11.5–15.5)
WBC: 6.9 10*3/uL (ref 4.0–10.5)
nRBC: 0 % (ref 0.0–0.2)

## 2022-12-11 LAB — COMPREHENSIVE METABOLIC PANEL
ALT: 35 U/L (ref 0–44)
AST: 40 U/L (ref 15–41)
Albumin: 3.4 g/dL — ABNORMAL LOW (ref 3.5–5.0)
Alkaline Phosphatase: 51 U/L (ref 38–126)
Anion gap: 13 (ref 5–15)
BUN: 10 mg/dL (ref 8–23)
CO2: 20 mmol/L — ABNORMAL LOW (ref 22–32)
Calcium: 8.5 mg/dL — ABNORMAL LOW (ref 8.9–10.3)
Chloride: 109 mmol/L (ref 98–111)
Creatinine, Ser: 0.99 mg/dL (ref 0.44–1.00)
GFR, Estimated: 59 mL/min — ABNORMAL LOW (ref >60)
Glucose, Bld: 91 mg/dL (ref 70–99)
Potassium: 3.5 mmol/L (ref 3.5–5.1)
Sodium: 142 mmol/L (ref 135–145)
Total Bilirubin: 0.8 mg/dL (ref 0.3–1.2)
Total Protein: 5.9 g/dL — ABNORMAL LOW (ref 6.5–8.1)

## 2022-12-11 LAB — TROPONIN I (HIGH SENSITIVITY)
Troponin I (High Sensitivity): 71 ng/L — ABNORMAL HIGH (ref <18)
Troponin I (High Sensitivity): 89 ng/L — ABNORMAL HIGH (ref <18)
Troponin I (High Sensitivity): 78 ng/L — ABNORMAL HIGH (ref <18)

## 2022-12-11 LAB — BRAIN NATRIURETIC PEPTIDE: B Natriuretic Peptide: 3264.3 pg/mL — ABNORMAL HIGH (ref 0.0–100.0)

## 2022-12-11 LAB — RESP PANEL BY RT-PCR (RSV, FLU A&B, COVID)  RVPGX2
Influenza A by PCR: NEGATIVE
Influenza B by PCR: NEGATIVE
Resp Syncytial Virus by PCR: NEGATIVE
SARS Coronavirus 2 by RT PCR: NEGATIVE

## 2022-12-11 LAB — PROTIME-INR
INR: 2.2 — ABNORMAL HIGH (ref 0.8–1.2)
Prothrombin Time: 24.3 seconds — ABNORMAL HIGH (ref 11.4–15.2)

## 2022-12-11 MED ORDER — METOPROLOL SUCCINATE ER 25 MG PO TB24
25.0000 mg | ORAL_TABLET | Freq: Every day | ORAL | Status: DC
  Administered 2022-12-11 – 2022-12-12 (×2): 25 mg via ORAL
  Filled 2022-12-11 (×2): qty 1

## 2022-12-11 MED ORDER — ALBUTEROL SULFATE (2.5 MG/3ML) 0.083% IN NEBU: 3.0000 mL | INHALATION_SOLUTION | Freq: Four times a day (QID) | RESPIRATORY_TRACT | Status: DC | PRN

## 2022-12-11 MED ORDER — SODIUM CHLORIDE 0.9% FLUSH: 3.0000 mL | INTRAVENOUS | Status: DC | PRN

## 2022-12-11 MED ORDER — OXYCODONE-ACETAMINOPHEN 5-325 MG PO TABS
1.0000 | ORAL_TABLET | Freq: Three times a day (TID) | ORAL | Status: DC | PRN
  Administered 2022-12-11: 1 via ORAL
  Filled 2022-12-11: qty 1

## 2022-12-11 MED ORDER — ONDANSETRON HCL 4 MG/2ML IJ SOLN: 4.0000 mg | Freq: Four times a day (QID) | INTRAMUSCULAR | Status: DC | PRN

## 2022-12-11 MED ORDER — EMPAGLIFLOZIN 10 MG PO TABS: 10.0000 mg | ORAL_TABLET | Freq: Every day | ORAL | Status: DC

## 2022-12-11 MED ORDER — FUROSEMIDE 10 MG/ML IJ SOLN
40.0000 mg | Freq: Once | INTRAMUSCULAR | Status: AC
  Administered 2022-12-11: 40 mg via INTRAVENOUS
  Filled 2022-12-11: qty 4

## 2022-12-11 MED ORDER — OXYCODONE-ACETAMINOPHEN 7.5-325 MG PO TABS: 1.0000 | ORAL_TABLET | Freq: Three times a day (TID) | ORAL | Status: DC | PRN

## 2022-12-11 MED ORDER — IPRATROPIUM-ALBUTEROL 0.5-2.5 (3) MG/3ML IN SOLN
3.0000 mL | Freq: Once | RESPIRATORY_TRACT | Status: AC
  Administered 2022-12-11: 3 mL via RESPIRATORY_TRACT
  Filled 2022-12-11: qty 3

## 2022-12-11 MED ORDER — SACUBITRIL-VALSARTAN 97-103 MG PO TABS
1.0000 | ORAL_TABLET | Freq: Two times a day (BID) | ORAL | Status: DC
  Administered 2022-12-11 – 2022-12-12 (×2): 1 via ORAL
  Filled 2022-12-11 (×3): qty 1

## 2022-12-11 MED ORDER — SODIUM CHLORIDE 0.9% FLUSH
3.0000 mL | Freq: Two times a day (BID) | INTRAVENOUS | Status: DC
  Administered 2022-12-11 – 2022-12-12 (×3): 3 mL via INTRAVENOUS

## 2022-12-11 MED ORDER — WARFARIN - PHARMACIST DOSING INPATIENT: Freq: Every day | Status: DC

## 2022-12-11 MED ORDER — OXYCODONE HCL 5 MG PO TABS
2.5000 mg | ORAL_TABLET | Freq: Three times a day (TID) | ORAL | Status: DC | PRN
  Administered 2022-12-11: 2.5 mg via ORAL
  Filled 2022-12-11: qty 1

## 2022-12-11 MED ORDER — ACETAMINOPHEN 325 MG PO TABS: 650.0000 mg | ORAL_TABLET | ORAL | Status: DC | PRN

## 2022-12-11 MED ORDER — SERTRALINE HCL 50 MG PO TABS
50.0000 mg | ORAL_TABLET | Freq: Every day | ORAL | Status: DC
  Administered 2022-12-11 – 2022-12-12 (×2): 50 mg via ORAL
  Filled 2022-12-11 (×2): qty 1

## 2022-12-11 MED ORDER — SODIUM CHLORIDE 0.9 % IV SOLN: 250.0000 mL | INTRAVENOUS | Status: DC | PRN

## 2022-12-11 MED ORDER — WARFARIN SODIUM 2 MG PO TABS
4.0000 mg | ORAL_TABLET | Freq: Once | ORAL | Status: AC
  Administered 2022-12-11: 4 mg via ORAL
  Filled 2022-12-11: qty 1
  Filled 2022-12-11: qty 2

## 2022-12-11 NOTE — ED Triage Notes (Addendum)
Pt BIB [facility] from home d/t SOB that started yesterday gotten worse throughout the night. Pt does not wear oxygen at baseline. Upon EMS arrival, pt's O2 was 98% on RA. '5mg'$  albuterol given by EMS. Denies CP or N/V. VSS. Hx copd &CHF.   BP 170/102 CBG 146 HR 76  98% RA

## 2022-12-11 NOTE — ED Provider Notes (Signed)
[facility] Provider Note   CSN: [id number] Arrival date & time: 12/11/22  0720     History  Chief Complaint  Patient presents with   Shortness of Breath    Kristin Knight is a 77 y.o. female.  HPI     77 year old female comes in with chief complaint of shortness of breath. Patient has history of nonischemic cardiomyopathy, peripheral vascular arterial disease on warfarin, chronic venous insufficiency.  She is accompanied to the ER by her son.  According to the patient, she started noticing some shortness of breath yesterday.  Gradually she thinks that the shortness of breath has worsened.  She is short of breath with any activity, including going to the kitchen or bathroom.  She denies any associated chest pain.  She feels more comfortable when sitting up, but denies shortness of breath exclusively when she is laying flat.  Review of system is also negative for any URI-like symptoms, leg swelling, weight gain.  Patient states that she has been taking her Coumadin and Lasix as prescribed.  Home Medications Prior to Admission medications   Medication Sig Start Date End Date Taking? Authorizing Provider  albuterol (VENTOLIN HFA) 108 (90 Base) MCG/ACT inhaler Inhale 2 puffs into the lungs every 6 (six) hours as needed for wheezing or shortness of breath.    [provider]  alendronate (FOSAMAX) 70 MG tablet Take 1 tablet (70 mg total) by mouth every 7 (seven) days. Take with a full glass of water on an empty stomach. 10/22/22 10/22/23  Sid Falcon, MD  atorvastatin (LIPITOR) 40 MG tablet Take 1 tablet by mouth once daily 02/03/22   Sid Falcon, MD  empagliflozin (JARDIANCE) 10 MG TABS tablet Take 1 tablet (10 mg total) by mouth daily before breakfast. 09/23/22   Clegg, Amy D, NP  furosemide (LASIX) 40 MG tablet Take 1 tablet (40 mg total) by mouth daily. 11/30/21   Larey Dresser, MD  metoprolol succinate (TOPROL-XL) 25 MG 24  hr tablet Take 1 tablet by mouth once daily 12/24/21   Sid Falcon, MD  oxyCODONE-acetaminophen (PERCOCET) 7.5-325 MG tablet Take 1 tablet by mouth every 8 (eight) hours as needed for severe pain. 10/22/22   Sid Falcon, MD  sacubitril-valsartan (ENTRESTO) 97-103 MG Take 1 tablet by mouth 2 (two) times daily. 11/30/21   Larey Dresser, MD  sertraline (ZOLOFT) 50 MG tablet Take 1 tablet (50 mg total) by mouth daily. 10/22/22   Sid Falcon, MD  spironolactone (ALDACTONE) 25 MG tablet TAKE 1 TABLET BY MOUTH ONCE DAILY 10/21/21   Rudean Haskell A, MD  warfarin (COUMADIN) 4 MG tablet Take one (1) of your '4mg'$  strength-blue warfarin tablets, on Mondays, Wednesdays and Fridays. Take only one-half (1/2) tablet on Sundays, Tuesdays, Thursdays, and Saturdays. 09/27/22   Pennie Banter, RPH-CPP      Allergies    Penicillins, Meperidine hcl, and Chantix [varenicline]    Review of Systems   Review of Systems  All other systems reviewed and are negative.   Physical Exam Updated Vital Signs BP (!) 160/98   Pulse 83   Temp (!) 97.4 F (36.3 C) (Oral)   Resp (!) 21   LMP 01/13/1974   SpO2 100%  Physical Exam Vitals and nursing note reviewed.  Constitutional:      Appearance: She is well-developed.  HENT:     Head: Atraumatic.  Neck:     Vascular: No JVD.  Cardiovascular:  Rate and Rhythm: Normal rate.     Heart sounds: Murmur heard.  Pulmonary:     Effort: Pulmonary effort is normal.     Breath sounds: No rales.  Musculoskeletal:     Cervical back: Normal range of motion and neck supple.     Right lower leg: No tenderness. No edema.     Left lower leg: No tenderness. No edema.  Skin:    General: Skin is warm and dry.  Neurological:     Mental Status: She is alert and oriented to person, place, and time.     ED Results / Procedures / Treatments   Labs (all labs ordered are listed, but only abnormal results are displayed) Labs Reviewed  COMPREHENSIVE METABOLIC  PANEL - Abnormal; Notable for the following components:      Result Value   CO2 20 (*)    Calcium 8.5 (*)    Total Protein 5.9 (*)    Albumin 3.4 (*)    GFR, Estimated 59 (*)    All other components within normal limits  CBC - Abnormal; Notable for the following components:   Hemoglobin 11.8 (*)    MCHC 29.4 (*)    All other components within normal limits  BRAIN NATRIURETIC PEPTIDE - Abnormal; Notable for the following components:   B Natriuretic Peptide 3,264.3 (*)    All other components within normal limits  RESP PANEL BY RT-PCR (RSV, FLU A&B, COVID)  [id number]  PROTIME-INR  TROPONIN I (HIGH SENSITIVITY)    EKG EKG Interpretation  Date/Time:  Saturday December 11 2022 08:33:28 EST Ventricular Rate:  75 PR Interval:  112 QRS Duration: 82 QT Interval:  434 QTC Calculation: 485 R Axis:   70 Text Interpretation: Sinus rhythm Atrial premature complexes Borderline short PR interval Borderline repolarization abnormality inferior TWI is new Confirmed by Varney Biles ([id number]) on 12/11/2022 8:44:09 AM  Radiology DG Chest Port 1 View  Result Date: 12/11/2022 CLINICAL DATA:  77 year old female with shortness of breath. EXAM: PORTABLE CHEST 1 VIEW COMPARISON:  Chest radiographs 10/22/2022 and earlier. FINDINGS: Portable AP upright view at 0821 hours. Large lung volumes previously. Evidence of emphysema. Lower lung volumes now, with veiling and patchy opacity at both lung bases greater on the right. Visible mediastinal contours remain normal. No pneumothorax or pulmonary edema. Osteopenia. No acute osseous abnormality identified. IMPRESSION: Chronic pulmonary hyperinflation, probable emphysema. New patchy and veiling bibasilar opacity, greater on the right. This might reflect a combination of small pleural effusions and airspace opacity, and favor acute infectious exacerbation. Electronically Signed   By: Genevie Ann M.D.   On: 12/11/2022 08:28    Procedures Procedures    Medications Ordered  in ED Medications  sodium chloride flush (NS) 0.9 % injection 3 mL (3 mLs Intravenous Given 12/11/22 1215)  sodium chloride flush (NS) 0.9 % injection 3 mL (has no administration in time range)  0.9 %  sodium chloride infusion (has no administration in time range)  acetaminophen (TYLENOL) tablet 650 mg (has no administration in time range)  ondansetron (ZOFRAN) injection 4 mg (has no administration in time range)  sacubitril-valsartan (ENTRESTO) 97-103 mg per tablet (has no administration in time range)  metoprolol succinate (TOPROL-XL) 24 hr tablet 25 mg (has no administration in time range)  ipratropium-albuterol (DUONEB) 0.5-2.5 (3) MG/3ML nebulizer solution 3 mL (3 mLs Nebulization Given 12/11/22 0836)  furosemide (LASIX) injection 40 mg (40 mg Intravenous Given 12/11/22 1206)    ED Course/ Medical Decision Making/ A&P  Medical Decision Making This patient presents to the ED with chief complaint(s) of shortness of breath that started getting worse yesterday with pertinent past medical history of COPD, CHF, peripheral vascular occlusive disease on warfarin.The complaint involves an extensive differential diagnosis and also carries with it a high risk of complications and morbidity.    The differential diagnosis includes : Acute coronary syndrome/unstable angina, acute CHF, pneumonia, viral illness, pulmonary embolism, severe anemia, severe valve disease  I reviewed patient's prior medical history.  She had cardiac cath within the last 3 years which was indicative of nonocclusive coronary artery disease.  Her echocardiogram has shown improved ejection fraction.  Our exam is not indicative clearly of volume overload, there is no signs of DVT, and although inhaler helped her breathe better, she was never noted to be wheezing.  Initial plan is to get basic lab workup including chest x-ray and EKG.   Additional history obtained: Additional history obtained from  family Records reviewed previous admission documents and previous imaging records  Independent labs interpretation:  The following labs were independently interpreted: CBC shows no profound anemia.  BNP is over 4000  Independent visualization and interpretation of imaging: - I independently visualized the following imaging with scope of interpretation limited to determining acute life threatening conditions related to emergency care: X-ray of the chest, which revealed some findings consistent with pulmonary edema.  Low clinical suspicion for pneumonia.  Treatment and Reassessment: Patient feels better in the ER, but not baseline normal. Her BNP is elevated.  My suspicion is that she is having worsening diastolic dysfunction leading to shortness of breath.  PE is still possible, but she is on warfarin, there is no signs of DVT.  Less likely for this to be coronary artery disease given catheterization that was negative within the last 3 years.  It would be best to admit her as congestive heart failure and started diuresing.  If she does not improve, then she will merit further workup by the admitting team.  Patient and the son have been made aware of this plan.  Problems Addressed: Acute systolic congestive heart failure ([place]): acute illness or injury with systemic symptoms Dyspnea, unspecified type: undiagnosed new problem with uncertain prognosis  Amount and/or Complexity of Data Reviewed Labs: ordered. Radiology: ordered.  Risk Prescription drug management. Decision regarding hospitalization.    Final Clinical Impression(s) / ED Diagnoses Final diagnoses:  Acute systolic congestive heart failure (HCC)  Dyspnea, unspecified type    Rx / DC Orders ED Discharge Orders     None         Varney Biles, MD 12/11/22 1219
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
## 2022-12-11 NOTE — Care Management Obs Status (Signed)
[place] NOTIFICATION   Patient Details  Name: MAYLEA SORIA MRN: [id number] Date of Birth: Jul 11, 1946   Medicare Observation Status Notification Given:  Yes  Verbal permission to sign  Verdell Carmine, RN 12/11/2022, 4:35 PM

## 2022-12-11 NOTE — ED Notes (Signed)
Pt ambulated to the bathroom. Pt able to walk independently.

## 2022-12-11 NOTE — H&P (Signed)
NAME:  Kristin Knight, MRN:  [id number], DOB:  January 16, 1946, LOS: 0 ADMISSION DATE:  12/11/2022, Primary: Sid Falcon, MD  CHIEF COMPLAINT:  dyspnea   Medical Service: Internal Medicine Teaching Service         Attending Physician: Dr. Campbell Riches, MD    First Contact: Dr. Carin Primrose Pager: [phone]  Second Contact: Dr. Raymondo Band Pager: [phone]       After Hours (After 5p/  First Contact Pager: [phone]  weekends / holidays): Second Contact Pager: [place]   [name] is 77yo person with chronic systolic heart failure, peripheral arterial disease s/p left femoropopliteal bypass complicated by thrombosis s/p thrombectomy now on chronic warfarin, hypertension, COPD, chronic venous insufficiency presenting to [facility] with one day of dyspnea. Kristin Knight reports this morning Kristin Knight was having significant dyspnea, especially on exertion. Notes Kristin Knight was only able to walk a few feet before becoming short of breath. Kristin Knight has felt mildly short of breath with non-productive the last few days but not to this extent. Patient also endorses left-sided, stabbing chest pain that worsens when Kristin Knight lays down. This has been intermittently ongoing for the last 3-4 days. Kristin Knight denies any orthopnea or paroxysmal nocturnal dyspnea. Reports her left leg was swollen the other day, but this has resolved. Kristin Knight has recently experienced crampy abdominal pain and watery, non-bloody diarrhea for the last few days, which has now resolved. Her appetite has been decreased the last few days during this episode. Kristin Knight denies fevers, chills, dysuria. Kristin Knight mentions drinking water and coffee daily.   PCP: Sid Falcon, MD  ED COURSE   Patient arrived to [facility] afebrile, hemodynamically stable sating well on room air. Lab work revealed normocytic anemia, significantly elevated BNP to 3000. Chest x-ray with vascular congestion bilaterally. IMTS subsequently consulted for admission for acute heart failure.    PAST MEDICAL HISTORY   Kristin Knight,  has a past medical history of Benign neoplasm of kidney, Chronic venous insufficiency (01/31/2015), Diverticulosis (01/26/2013), Essential hypertension (09/28/2006), Exposure to hepatitis B, Ganglion cyst (12/07), History of alcohol abuse, Internal hemorrhoids (01/26/2013), Major depressive disorder, recurrent, moderate ([place]) (09/28/2006), Marijuana use (03/07/2017), Mild chronic obstructive pulmonary disease ([place]) (07/15/2008), Mild protein-calorie malnutrition ([place]) (02/17/2018), Muscle spasms of neck (11/24/2012), Osteoporosis (03/27/2014), Peripheral arterial occlusive disease ([place]) (05/14/2011), Seasonal allergies (11/24/2012), Tobacco abuse (09/28/2006), and 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   HOME MEDICATIONS   Prior to Admission medications   Medication Sig Start Date End Date Taking? Authorizing Provider  warfarin (COUMADIN) 4 MG tablet Take one (1) of your '4mg'$  strength-blue warfarin tablets, on Mondays, Wednesdays and Fridays. Take only one-half (1/2) tablet on Sundays, Tuesdays, Thursdays, and Saturdays. 09/27/22  Yes Pennie Banter, RPH-CPP  albuterol (VENTOLIN HFA) 108 (90 Base) MCG/ACT inhaler Inhale 2 puffs into the lungs every 6 (six) hours as needed for wheezing or shortness of breath.    [provider]  alendronate (FOSAMAX) 70 MG tablet Take 1 tablet (70 mg total) by mouth every 7 (seven) days. Take with a full glass of water on an empty stomach. 10/22/22 10/22/23  Sid Falcon, MD  atorvastatin (LIPITOR) 40 MG tablet Take 1 tablet by mouth once daily 02/03/22   Sid Falcon, MD  empagliflozin (JARDIANCE) 10 MG TABS tablet Take 1 tablet (10 mg total) by mouth daily before breakfast. 09/23/22   Clegg, Amy D, NP  furosemide (LASIX) 40 MG tablet Take 1 tablet (40 mg total) by mouth daily. 11/30/21  Larey Dresser, MD  metoprolol succinate (TOPROL-XL) 25 MG 24 hr tablet Take 1 tablet by mouth once daily 12/24/21   Sid Falcon, MD   oxyCODONE-acetaminophen (PERCOCET) 7.5-325 MG tablet Take 1 tablet by mouth every 8 (eight) hours as needed for severe pain. 10/22/22   Sid Falcon, MD  sacubitril-valsartan (ENTRESTO) 97-103 MG Take 1 tablet by mouth 2 (two) times daily. 11/30/21   Larey Dresser, MD  sertraline (ZOLOFT) 50 MG tablet Take 1 tablet (50 mg total) by mouth daily. 10/22/22   Sid Falcon, MD  spironolactone (ALDACTONE) 25 MG tablet TAKE 1 TABLET BY MOUTH ONCE DAILY 10/21/21   Werner Lean, MD    ALLERGIES   Allergies as of 12/11/2022 - Review Complete 12/11/2022  Allergen Reaction Noted   Penicillins Anaphylaxis and Other (See Comments)    Meperidine hcl Other (See Comments)    Chantix [varenicline] Nausea And Vomiting 03/12/2016    SOCIAL HISTORY   Patient has lived in [place] all of her life. Kristin Knight currently lives with her son, but is completely independent with ADL's and IADL's. 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 Kristin Knight does have previous history of smoking, but none current. Denies alcohol or recreational drug use.   FAMILY HISTORY   Her family history includes Breast cancer in her maternal aunt and maternal grandmother; Breast cancer (age of onset: 66) in her mother; Cancer (age of onset: 56) in her brother; Colon cancer in her cousin; Drug abuse in her sister; Healthy in her brother, daughter, daughter, daughter, son, and son; Heart attack (age of onset: 72) in her sister and sister; Heart attack (age of onset: 25) in her brother and father; Heart attack (age of onset: 33) in her brother; Hypertension in her daughter. There is no history of Esophageal cancer, Stomach cancer, or Rectal cancer.   REVIEW OF SYSTEMS   ROS per history of present illness.  PHYSICAL EXAMINATION   Blood pressure (!) 160/98, pulse 83, temperature (!) 97.5 F (36.4 C), temperature source Oral, resp. rate (!) 21, last menstrual period 01/13/1974, SpO2 100 %.    There were no vitals filed for  this visit.  GENERAL: Elderly appearing person laying in bed in no acute distress HENT: Normocephalic, atraumatic. Moist mucous membranes. EYES: Vision grossly in tact. No scleral icterus or conjunctival injection bilaterally. CV: Regular rate, rhythm. No murmurs appreciated. Warm extremities.  PULM: Normal work of breathing on room air. Bibasilar rales appreciated. No wheezing. GI: Abdomen soft, non-tender, non-distended. Normoactive bowel sounds. MSK: Normal bulk, tone. Mild non-pitting edema on left lower extremity, none on right. SKIN: Warm, dry. No rashes or lesions appreciated. NEURO: Awake, alert, conversing appropriately. Grossly non-focal. PSYCH: Normal mood, affect, speech.   SIGNIFICANT DIAGNOSTIC TESTS   ECG: Normal sinus rhythm, normal axis. No significant ST changes.   I personally reviewed patient's ECG with my interpretation as above.  CXR: Bibasilar vascular congestion, R>L with small R pleural effusion.   I personally reviewed patient's ECG with my interpretation as above.  LABS      Latest Ref Rng & Units 12/11/2022    8:11 AM 10/22/2022    9:40 AM 06/11/2022   11:34 AM  CBC  WBC 4.0 - 10.5 K/uL 6.9  4.6  3.5   Hemoglobin 12.0 - 15.0 g/dL 11.8  13.4  13.8   Hematocrit 36.0 - 46.0 % 40.1  39.7  44.1   Platelets 150 - 400 K/uL 168  217  221       Latest Ref Rng & Units 12/11/2022    8:11 AM 10/22/2022    9:40 AM 06/11/2022   11:34 AM  BMP  Glucose 70 - 99 mg/dL 91  115  104   BUN 8 - 23 mg/dL '10  10  11   '$ Creatinine 0.44 - 1.00 mg/dL 0.99  1.00  1.06   BUN/Creat Ratio 12 - '28  10  10   '$ Sodium 135 - 145 mmol/L 142  143  143   Potassium 3.5 - 5.1 mmol/L 3.5  3.7  4.8   Chloride 98 - 111 mmol/L 109  100  98   CO2 22 - 32 mmol/L '20  25  29   '$ Calcium 8.9 - 10.3 mg/dL 8.5  9.8  10.1     CONSULTS   N/a  ASSESSMENT   Romonia Yanik is 77yo person with chronic systolic heart failure, peripheral arterial disease s/p left femoropopliteal bypass complicated by  thrombosis s/p thrombectomy now on chronic warfarin, hypertension, COPD, chronic venous insufficiency admitted 1/27 for acute on chronic heart failure.   PLAN   Principal Problem:   Acute on chronic heart failure (HCC)  #Acute on chronic heart failure #Non-ischemic cardiomyopathy Most recent TTE w/ EF 55-60%. Patient hemodynamically stable, not hypoxic w/ elevated BNP to 3000, vascular congestion on plain films. While Kristin Knight has not been hypoxic, Kristin Knight is severely symptomatic. Kristin Knight has had recent diarrheal illness likely precipitating acute heart failure. On exam no signs of decompensated heart failure. Kristin Knight has also been having intermittent atypical chest pain, will get troponin levels, ECG without significant ST changes. Kristin Knight was given dose of IV lasix in ED, hopefully will be able to discharge within the next 1-2 days. - S/p IV lasix '40mg'$   - Follow-up troponin levels - Daily BMP, Mg - Entresto 97-'103mg'$  twice daily  - Metoprolol succinate '25mg'$  daily - Hold spironolactone, Jardiance during diuresis - Daily weights - Strict I/O - Tele  #Peripheral arterial disease #Previous thrombosis s/p thrombectomy Mild non-pitting edema on LLE, to be expected with previous bypass. Kristin Knight has been compliant with anticoagulation, no signs of acute thrombosis. Will continue with home medications.  - Continue home atorvastatin '40mg'$  - Warfarin per pharmacy - Daily INR  #COPD History most concerning for acute heart failure, no wheezing on exam. Kristin Knight is not on any maintenance inhalers. - Albuterol as needed  #Chronic back pain - Continue home Percocet 7.5-'325mg'$  every 8h as needed  #Major depression disorder - Continue home sertraline '50mg'$  daily  BEST PRACTICE   DIET: HH IVF: n/a DVT PPX: warfarin BOWEL: n/a CODE: FULL FAM COM: n/a  DISPO: Admit patient to Observation with expected length of stay less than 2 midnights.  Sanjuan Dame, MD Internal Medicine Resident PGY-3 Pager  [phone] 12/11/22 1:02 PM

## 2022-12-11 NOTE — ED Notes (Addendum)
Previous note at this timestamp documented in error.

## 2022-12-12 ENCOUNTER — Encounter (HOSPITAL_COMMUNITY): Payer: Self-pay | Admitting: Infectious Diseases

## 2022-12-12 LAB — BASIC METABOLIC PANEL
Anion gap: 8 (ref 5–15)
BUN: 12 mg/dL (ref 8–23)
CO2: 29 mmol/L (ref 22–32)
Calcium: 8.7 mg/dL — ABNORMAL LOW (ref 8.9–10.3)
Chloride: 102 mmol/L (ref 98–111)
Creatinine, Ser: 0.94 mg/dL (ref 0.44–1.00)
GFR, Estimated: >60 mL/min (ref >60)
Glucose, Bld: 85 mg/dL (ref 70–99)
Potassium: 3.7 mmol/L (ref 3.5–5.1)
Sodium: 139 mmol/L (ref 135–145)

## 2022-12-12 LAB — PROTIME-INR
INR: 2.9 — ABNORMAL HIGH (ref 0.8–1.2)
Prothrombin Time: 30.4 seconds — ABNORMAL HIGH (ref 11.4–15.2)

## 2022-12-12 MED ORDER — WARFARIN SODIUM 2 MG PO TABS: 2.0000 mg | ORAL_TABLET | Freq: Once | ORAL | Status: DC

## 2022-12-12 MED ORDER — FUROSEMIDE 40 MG PO TABS
40.0000 mg | ORAL_TABLET | Freq: Every day | ORAL | Status: DC
  Administered 2022-12-12: 40 mg via ORAL
  Filled 2022-12-12: qty 1
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
## 2022-12-12 NOTE — Evaluation (Addendum)
Physical Therapy Evaluation and Discharge Patient Details Name: Kristin Knight MRN: [id number] DOB: [birth year] Today's Date: 12/12/2022  History of Present Illness  Pt is a 77 y.o. F who presents 12/11/2022 with dyspnea on exertion and left sided stabbing chest pain. Admitted with acute on chronic heart failure. Significant PMH: CHF, PAD s/p left femoropopliteal bypass, HTN, COPD, chronic venous insufficiency.  Clinical Impression  Patient evaluated by Physical Therapy with no further acute PT needs identified. 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 Pt presents with mildly decreased cardiopulmonary endurance. Pt ambulating 400 ft with no assistive device or physical difficulty; SpO2 92-100% on RA, HR 75-89 bpm, BP 139/111 pre mobility. Education provided regarding generalized activity recommendations, daily weights, monitoring for signs/symptoms of HF. All education has been completed and the patient has no further questions. No follow-up Physical Therapy or equipment needs. PT is signing off. Thank you for this referral.  SATURATION QUALIFICATIONS: (This note is used to comply with regulatory documentation for home oxygen)  Patient Saturations on Room Air at Rest = 100%  Patient Saturations on Room Air while Ambulating = 92%  Patient Saturations on 0 Liters of oxygen while Ambulating = N/A  Please briefly explain why patient needs home oxygen: Pt does not require home oxygen     Recommendations for follow up therapy are one component of a multi-disciplinary discharge planning process, led by the attending physician.  Recommendations may be updated based on patient status, additional functional criteria and insurance authorization.  Follow Up Recommendations No PT follow up      Assistance Recommended at Discharge None  Patient can return home with the following       Equipment Recommendations None recommended by PT  Recommendations for Other  Services       Functional Status Assessment Patient has had a recent decline in their functional status and demonstrates the ability to make significant improvements in function in a reasonable and predictable amount of time.     Precautions / Restrictions Precautions Precautions: None Restrictions Weight Bearing Restrictions: No      Mobility  Bed Mobility Overal bed mobility: Modified Independent                  Transfers Overall transfer level: Independent Equipment used: None                    Ambulation/Gait Ambulation/Gait assistance: Modified independent (Device/Increase time) Gait Distance (Feet): 400 Feet Assistive device: None Gait Pattern/deviations: Step-through pattern, Decreased stride length Gait velocity: decreased     General Gait Details: Decreased gait speed for age and mild unsteadiness; improved with increased distance  Stairs            Wheelchair Mobility    Modified Rankin (Stroke Patients Only)       Balance Overall balance assessment: Mild deficits observed, not formally tested                                           Pertinent Vitals/Pain Pain Assessment Pain Assessment: No/denies pain    Home Living Family/patient expects to be discharged to:: Private residence Living Arrangements: Children (older son) Available Help at Discharge: Family Type of Home: House Home Access: Stairs to enter   Technical brewer of Steps: 1   Home Layout: One level Home Equipment: None  Prior Function Prior Level of Function : Independent/Modified Independent             Mobility Comments: works 4 days/wk, 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       Hand Dominance        Extremity/Trunk Assessment   Upper Extremity Assessment Upper Extremity Assessment: Overall WFL for tasks assessed    Lower Extremity Assessment Lower Extremity Assessment: Overall WFL for tasks assessed     Cervical / Trunk Assessment Cervical / Trunk Assessment: Normal  Communication   Communication: No difficulties  Cognition Arousal/Alertness: Awake/alert Behavior During Therapy: WFL for tasks assessed/performed Overall Cognitive Status: Within Functional Limits for tasks assessed                                          General Comments      Exercises     Assessment/Plan    PT Assessment Patient does not need any further PT services  PT Problem List         PT Treatment Interventions      PT Goals (Current goals can be found in the Care Plan section)  Acute Rehab PT Goals Patient Stated Goal: return to baseline PT Goal Formulation: All assessment and education complete, DC therapy    Frequency       Co-evaluation               AM-PAC PT "6 Clicks" Mobility  Outcome Measure Help needed turning from your back to your side while in a flat bed without using bedrails?: None Help needed moving from lying on your back to sitting on the side of a flat bed without using bedrails?: None Help needed moving to and from a bed to a chair (including a wheelchair)?: None Help needed standing up from a chair using your arms (e.g., wheelchair or bedside chair)?: None Help needed to walk in hospital room?: None Help needed climbing 3-5 steps with a railing? : None 6 Click Score: 24    End of Session   Activity Tolerance: Patient tolerated treatment well Patient left: in chair;with call bell/phone within reach Nurse Communication: Mobility status PT Visit Diagnosis: Unsteadiness on feet (R26.81);Difficulty in walking, not elsewhere classified (R26.2)    Time: 8341-9622 PT Time Calculation (min) (ACUTE ONLY): 18 min   Charges:   PT Evaluation $PT Eval Low Complexity: [place], PT, DPT Acute Rehabilitation Services Office [phone]   Deno Etienne 12/12/2022, 8:38 AM

## 2022-12-12 NOTE — Discharge Instructions (Addendum)
[name]  You were treated in the hospital for shortness of breath. You were treated for exacerbation of heart failure. You received medicine through your IV to help your body eliminate some of the excess fluid. We are discharging you now that you are doing better. To help assist you on your road to recovery, I have written the following recommendations:   Please continue taking your medication as prescribed. I don't recommend any changes to your medication list at this time.  Follow up with your primary care doctor as soon as possible after leaving the hospital.  It was a privilege to be a part of your hospital care team, and I hope you feel better as a result of your stay.  All the best, Nani Gasser, MD

## 2022-12-12 NOTE — Discharge Summary (Signed)
Name: Kristin Knight MRN: [id number] DOB: 1946-06-05 77 y.o. PCP: Sid Falcon, MD  Date of Admission: 12/11/2022  7:20 AM Date of Discharge: 12/12/2022 2:07 PM Attending Physician: No att. providers found  Discharge Diagnosis: Principal Problem:   Acute on chronic heart failure ([place]) Active Problems:   Chronic obstructive pulmonary disease ([place])   Acute systolic congestive heart failure ([place])   Discharge Medications: Allergies as of 12/12/2022       Reactions   Penicillins Anaphylaxis, Other (See Comments)   Patient passed out Has patient had a PCN reaction causing immediate rash, facial/tongue/throat swelling, SOB or lightheadedness with hypotension: No Has patient had a PCN reaction causing severe rash involving mucus membranes or skin necrosis: No Has patient had a PCN reaction that required hospitalization: No Has patient had a PCN reaction occurring within the last 10 years: No If all of the above answers are "NO", then may proceed with Cephalosporin use.   Meperidine Hcl Other (See Comments)   nevousness (Demerol)   Chantix [varenicline] Nausea And Vomiting        Medication List     TAKE these medications    albuterol 108 (90 Base) MCG/ACT inhaler Commonly known as: VENTOLIN HFA Inhale 2 puffs into the lungs every 6 (six) hours as needed for wheezing or shortness of breath.   alendronate 70 MG tablet Commonly known as: Fosamax Take 1 tablet (70 mg total) by mouth every 7 (seven) days. Take with a full glass of water on an empty stomach.   atorvastatin 40 MG tablet Commonly known as: LIPITOR Take 1 tablet by mouth once daily   empagliflozin 10 MG Tabs tablet Commonly known as: Jardiance Take 1 tablet (10 mg total) by mouth daily before breakfast.   Entresto 97-103 MG Generic drug: sacubitril-valsartan Take 1 tablet by mouth 2 (two) times daily.   furosemide 40 MG tablet Commonly known as: LASIX Take 1 tablet (40 mg total) by mouth daily.    metoprolol succinate 25 MG 24 hr tablet Commonly known as: TOPROL-XL Take 1 tablet by mouth once daily   oxyCODONE-acetaminophen 7.5-325 MG tablet Commonly known as: PERCOCET Take 1 tablet by mouth every 8 (eight) hours as needed for severe pain.   sertraline 50 MG tablet Commonly known as: ZOLOFT Take 1 tablet (50 mg total) by mouth daily.   spironolactone 25 MG tablet Commonly known as: ALDACTONE TAKE 1 TABLET BY MOUTH ONCE DAILY   warfarin 4 MG tablet Commonly known as: COUMADIN Take as directed. If you are unsure how to take this medication, talk to your nurse or doctor. Original instructions: Take one (1) of your '4mg'$  strength-blue warfarin tablets, on Mondays, Wednesdays and Fridays. Take only one-half (1/2) tablet on Sundays, Tuesdays, Thursdays, and Saturdays.        Follow-up Appointments:  Follow-up Information     Sid Falcon, MD. Call.   Specialty: Internal Medicine Why: Call for an appointment as soon as possible after leaving the hospital. Contact information: [place] [address] [phone]                 Disposition and follow-up: [name] is a 77 y.o. year old admitted for dyspnea due to acute decompensated heart failure.  Acute decompensated heart failure Volume overloaded on exam.  Not in a low output state.  ACS ruled out.  Thought to be due to a recent diarrheal illness.  Diuresed well and discharged after an overnight stay. -  No outpatient medication changes recommended on discharge  Hospital Course by problem list:  Acute decompensated heart failure Presents with dyspnea.  Hemodynamically stable.  Not hypoxic.  BNP 3000.  Vascular congestion on chest x-ray.  In setting of recent diarrheal illness.  Not in a low output state.  Associated with intermittent atypical chest pain.  ECG and troponin are reassuring.  Admitted for IV Lasix.  Symptoms improved after 40 mg IV.  On following day she was essentially  asymptomatic and discharged home in good condition.  Discharge Exam: Feeling much better than when she came to the hospital yesterday.  Shortness of breath is improved.  She is able to ambulate without requiring oxygen.   Blood pressure 138/86, pulse 61, temperature 98.6 F (37 C), temperature source Oral, resp. rate 20, height '5\' 4"'$  (1.626 m), weight 43.4 kg, last menstrual period 01/13/1974, SpO2 96 %.  Comfortable appearing Heart rate and rhythm are regular, no JVD, no lower extremity edema Breathing is regular and unlabored, lungs with faint bibasilar crackles Skin is warm and dry Alert and oriented Pleasant, mood and affect are concordant  Pertinent studies and procedures:  Chest x-ray IMPRESSION: Chronic pulmonary hyperinflation, probable emphysema. New patchy and veiling bibasilar opacity, greater on the right. This might reflect a combination of small pleural effusions and airspace opacity, and favor acute infectious exacerbation.   Latest Reference Range & Units 12/12/22 00:37  Sodium 135 - 145 mmol/L 139  Potassium 3.5 - 5.1 mmol/L 3.7  Chloride 98 - 111 mmol/L 102  CO2 22 - 32 mmol/L 29  Glucose 70 - 99 mg/dL 85  BUN 8 - 23 mg/dL 12  Creatinine 0.44 - 1.00 mg/dL 0.94  Calcium 8.9 - 10.3 mg/dL 8.7 (L)  Anion gap 5 - 15  8  GFR, Estimated >60 mL/min >60  (L): Data is abnormally low   Latest Reference Range & Units 12/11/22 11:59 12/11/22 18:08 12/11/22 20:02  Troponin I (High Sensitivity) <18 ng/L 71 (H) 89 (H) 78 (H)  (H): Data is abnormally high   Latest Reference Range & Units 12/11/22 08:11  WBC 4.0 - 10.5 K/uL 6.9  RBC 3.87 - 5.11 MIL/uL 4.06  Hemoglobin 12.0 - 15.0 g/dL 11.8 (L)  HCT 36.0 - 46.0 % 40.1  MCV 80.0 - 100.0 fL 98.8  MCH 26.0 - 34.0 pg 29.1  MCHC 30.0 - 36.0 g/dL 29.4 (L)  RDW 11.5 - 15.5 % 14.5  Platelets 150 - 400 K/uL 168  nRBC 0.0 - 0.2 % 0.0  (L): Data is abnormally low   Latest Reference Range & Units 12/11/22 08:11  Influenza A  By PCR NEGATIVE  NEGATIVE  Influenza B By PCR NEGATIVE  NEGATIVE  Respiratory Syncytial Virus by PCR NEGATIVE  NEGATIVE  SARS Coronavirus 2 by RT PCR NEGATIVE  NEGATIVE    Discharge Instructions:   Discharge Instructions      [name]  You were treated in the hospital for shortness of breath. You were treated for exacerbation of heart failure. You received medicine through your IV to help your body eliminate some of the excess fluid. We are discharging you now that you are doing better. To help assist you on your road to recovery, I have written the following recommendations:   Please continue taking your medication as prescribed. I don't recommend any changes to your medication list at this time.  Follow up with your primary care doctor as soon as possible after leaving the hospital.  It was  a privilege to be a part of your hospital care team, and I hope you feel better as a result of your stay.  All the best, Nani Gasser, MD     Nani Gasser MD 12/12/2022, 2:07 PM

## 2022-12-12 NOTE — Plan of Care (Signed)

## 2022-12-13 ENCOUNTER — Telehealth: Payer: Self-pay

## 2022-12-13 NOTE — Telephone Encounter (Signed)
Transition Care Management Follow-up Telephone Call Date of discharge and from where: [facility] 12/12/2022 How have you been since you were released from the hospital? Much better Any questions or concerns? Yes  Items Reviewed: Did the pt receive and understand the discharge instructions provided? Yes  Medications obtained and verified? Yes  Other? No  Any new allergies since your discharge? No  Dietary orders reviewed? Yes Do you have support at home? Yes   Home Care and Equipment/Supplies: Were home health services ordered? no If so, what is the name of the agency? N/a  Has the agency set up a time to come to the patient's home? not applicable Were any new equipment or medical supplies ordered?  No What is the name of the medical supply agency? N/a Were you able to get the supplies/equipment? not applicable Do you have any questions related to the use of the equipment or supplies? No  Functional Questionnaire: (I = Independent and D = Dependent) ADLs: I  Bathing/Dressing- I  Meal Prep- I  Eating- I  Maintaining continence- I  Transferring/Ambulation- I  Managing Meds- I  Follow up appointments reviewed:  PCP Hospital f/u appt confirmed? Yes  Scheduled to see Dr Venia Minks on 12/17/2022 @ 9:15. [place] [facility] f/u appt confirmed? No   Are transportation arrangements needed? No  If their condition worsens, is the pt aware to call PCP or go to the Emergency Dept.? Yes Was the patient provided with contact information for the PCP's office or ED? Yes Was to pt encouraged to call back with questions or concerns? Yes Juanda Crumble, LPN [facility] Direct Dial [phone]

## 2022-12-17 ENCOUNTER — Ambulatory Visit (INDEPENDENT_AMBULATORY_CARE_PROVIDER_SITE_OTHER): Payer: Medicare HMO | Admitting: Internal Medicine

## 2022-12-17 ENCOUNTER — Other Ambulatory Visit: Payer: Self-pay

## 2022-12-17 ENCOUNTER — Encounter: Payer: Self-pay | Admitting: Internal Medicine

## 2022-12-17 VITALS — BP 127/70 | HR 54 | Temp 97.9°F | Ht 64.0 in | Wt 98.9 lb

## 2022-12-17 DIAGNOSIS — I110 Hypertensive heart disease with heart failure: Secondary | ICD-10-CM | POA: Diagnosis not present

## 2022-12-17 DIAGNOSIS — Z87891 Personal history of nicotine dependence: Secondary | ICD-10-CM

## 2022-12-17 DIAGNOSIS — I5022 Chronic systolic (congestive) heart failure: Secondary | ICD-10-CM

## 2022-12-17 DIAGNOSIS — R634 Abnormal weight loss: Secondary | ICD-10-CM

## 2022-12-17 DIAGNOSIS — I10 Essential (primary) hypertension: Secondary | ICD-10-CM

## 2022-12-17 DIAGNOSIS — M545 Low back pain, unspecified: Secondary | ICD-10-CM

## 2022-12-17 DIAGNOSIS — G8929 Other chronic pain: Secondary | ICD-10-CM

## 2022-12-17 MED ORDER — OXYCODONE-ACETAMINOPHEN 7.5-325 MG PO TABS: 1.0000 | ORAL_TABLET | Freq: Three times a day (TID) | ORAL | 0 refills | Status: DC | PRN

## 2022-12-17 NOTE — Assessment & Plan Note (Addendum)
BP at goal today at 122/70.  -Continue Metoprolol 25 mg, Entresto 97-103 mg, Spironolactone 25 mg daily

## 2022-12-17 NOTE — Patient Instructions (Addendum)
Kristin Knight,  It was great to see you in clinic today. We are glad you are feeling better.  Below is what we discussed today:  Your heart exam is stable this morning For your weight loss -  - We will discontinue Jardiance today as this can contribute to weight loss. We suggest following-up with [place] in 2-4 weeks to re-evaluate and make sure that your heart and fluid status are stable - We have reordered the chest CT screen and the hospital should be in contact with you to schedule that   Please note the following changes to your medications: Discontinue Jardiance 10 mg daily  It is a pleasure to be a part of your team, thank you for allowing [place] to be a part of your care,  Max and Dr. Daryll Drown  If you need medication refills please notify your pharmacy one week in advance and they will send [place] a request.

## 2022-12-17 NOTE — Assessment & Plan Note (Signed)
Assessment: Patient has lost about another 8% weight in the past three months. Her chest x-ray was negative for any notable mass. Her ESR and thyroid studies from 10/2022 were unremarkable with TSH slightly decreased. She has good appetite and adequate nutritional intake. Suspect that her weight loss could be side effect of Jardiance. Will plan to discontinue Jardiance today with close follow-up to ensure her chronic systolic HF is stable.  Plan -Discontinue Jardiance -Follow-up in 2-4 weeks to re-evaluate for HF exacerbation symtpoms
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
## 2022-12-17 NOTE — Assessment & Plan Note (Signed)
Kristin Knight was hospitalized from 1/27-1/28 for acute heart failure exacerbation thought to be precipitated by diarrheal illness. She presented with dyspnea. During hospitalization, she was not in a low output state. ECG and troponin were reassuring. Her symptoms improved after 40 mg IV. On following day she was essentially asymptomatic and discharged home in good condition.  On exam today, she has regular breathing effort with lungs clear to auscultation bilaterally. No JVD. Mild left lower extremity edema.  Assessment: Patient has been asymptomatic since hospital discharge and she is in stable condition today. Mild non-pitting edema on LLE expected with previous bypass. She has not had issues with her medications.  Plan -Continue Metoprolol 25 mg, Entresto 97-103 mg, Spironolactone 25 mg daily -Continue Lasix 40 mg daily  -Discontinue Jardiance 10 mg for persistent unintentional weight loss

## 2022-12-20 ENCOUNTER — Ambulatory Visit: Payer: Medicare HMO

## 2022-12-22 NOTE — Progress Notes (Signed)
Attestation for Student Documentation:  I personally was present and performed or re-performed the history, physical exam and medical decision-making activities of this service and have verified that the service and findings are accurately documented in the student's note.  Sid Falcon, MD 12/22/2022, 3:09 PM

## 2022-12-27 ENCOUNTER — Ambulatory Visit: Payer: Medicare HMO

## 2022-12-27 ENCOUNTER — Ambulatory Visit (INDEPENDENT_AMBULATORY_CARE_PROVIDER_SITE_OTHER): Payer: Medicare HMO | Admitting: Pharmacist

## 2022-12-27 DIAGNOSIS — Z7901 Long term (current) use of anticoagulants: Secondary | ICD-10-CM

## 2022-12-27 DIAGNOSIS — I779 Disorder of arteries and arterioles, unspecified: Secondary | ICD-10-CM | POA: Diagnosis not present

## 2022-12-27 DIAGNOSIS — T82868D Thrombosis of vascular prosthetic devices, implants and grafts, subsequent encounter: Secondary | ICD-10-CM

## 2022-12-27 DIAGNOSIS — T82868S Thrombosis of vascular prosthetic devices, implants and grafts, sequela: Secondary | ICD-10-CM

## 2022-12-27 LAB — POCT INR: INR: 2.1 (ref 2.0–3.0)

## 2022-12-27 MED ORDER — WARFARIN SODIUM 4 MG PO TABS: 4.0000 mg | ORAL_TABLET | Freq: Every day | ORAL | 2 refills | Status: DC

## 2022-12-27 NOTE — Patient Instructions (Signed)
Patient instructed to take medications as defined in the Anti-coagulation Track section of this encounter.  Patient instructed to take today's dose.  Patient instructed to take one (1) of your 4 milligram strength-blue warfarin tablets by mouth, once-daily at 6:00pm. Patient verbalized understanding of these instructions.
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
## 2022-12-28 NOTE — Progress Notes (Signed)
Evaluation and management procedures were performed by the Clinical Pharmacy Practitioner under my supervision and collaboration. I have reviewed the Practitioner's note and chart, and I agree with the management and plan as documented above. ° °

## 2022-12-31 ENCOUNTER — Encounter: Payer: Self-pay | Admitting: Internal Medicine

## 2022-12-31 ENCOUNTER — Other Ambulatory Visit: Payer: Self-pay

## 2022-12-31 ENCOUNTER — Ambulatory Visit (INDEPENDENT_AMBULATORY_CARE_PROVIDER_SITE_OTHER): Payer: Medicare HMO | Admitting: Internal Medicine

## 2022-12-31 VITALS — BP 147/92 | HR 55 | Temp 97.6°F | Ht 64.0 in | Wt 101.1 lb

## 2022-12-31 DIAGNOSIS — I110 Hypertensive heart disease with heart failure: Secondary | ICD-10-CM | POA: Diagnosis not present

## 2022-12-31 DIAGNOSIS — I5022 Chronic systolic (congestive) heart failure: Secondary | ICD-10-CM | POA: Diagnosis not present

## 2022-12-31 DIAGNOSIS — I779 Disorder of arteries and arterioles, unspecified: Secondary | ICD-10-CM

## 2022-12-31 DIAGNOSIS — Z87891 Personal history of nicotine dependence: Secondary | ICD-10-CM

## 2022-12-31 DIAGNOSIS — F331 Major depressive disorder, recurrent, moderate: Secondary | ICD-10-CM | POA: Diagnosis not present

## 2022-12-31 DIAGNOSIS — R634 Abnormal weight loss: Secondary | ICD-10-CM

## 2022-12-31 DIAGNOSIS — I10 Essential (primary) hypertension: Secondary | ICD-10-CM

## 2022-12-31 MED ORDER — SPIRONOLACTONE 25 MG PO TABS: 25.0000 mg | ORAL_TABLET | Freq: Every day | ORAL | 3 refills | Status: DC

## 2022-12-31 NOTE — Progress Notes (Signed)
Established Patient Office Visit  Subjective   Patient ID: Kristin Knight, female    DOB: [birth year]  Age: 77 y.o. MRN: [id number]  Chief Complaint  Patient presents with   Follow-up   Back Pain    Kristin Knight came to see me for recheck of her weight loss and how she is doing off of jardiance.   She notes that she feels much better.  She is eating more normally and feeling more energetic.  She has gained a few pounds since last being seen.  She does note a little more swelling in the left leg, which is normal for her after being on her feet.  She continues to work and is on her feet a lot.  We discussed compression stockings as an option for this issue.    She notes no other pain, no chest pain, SOB or other issues today. Back pain is controlled with percocet which she takes when she is on her feet for a prolonged time.     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 ([place]) 07/06/2019   Occlusion of femoropopliteal bypass graft ([place]) [id number]   Uncomplicated opioid use [id number]   Chronic venous insufficiency 01/31/2015   Chronic midline low back pain without sciatica 10/25/2014   Osteoporosis 04/16/2014   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/23/2013   Internal hemorrhoids 01/26/2013   Diverticulosis 01/26/2013   Seasonal allergies 11/24/2012   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  Respiratory:  Negative for cough and shortness of breath.   Cardiovascular:  Positive for leg swelling (mild, 1+ pitting on the left leg). Negative for chest pain and orthopnea.  Musculoskeletal:  Positive for back pain.  Psychiatric/Behavioral:  Negative for depression. The patient is not nervous/anxious.       Objective:     BP (!) 147/92 (BP Location: Left Arm, Patient Position: Sitting, Cuff Size: Small)   Pulse (!) 55   Temp 97.6 F (36.4 C) (Oral)   Ht 5' 4"$  (1.626 m)   Wt 101 lb 1.6 oz (45.9 kg)   LMP 01/13/1974   SpO2 99% Comment: RA  BMI 17.35 kg/m    Physical Exam Vitals and nursing note reviewed.  Constitutional:      General: She is not in acute distress.    Appearance: Normal appearance.  HENT:     Head: Normocephalic and atraumatic.  Cardiovascular:     Rate and Rhythm: Normal rate and regular rhythm.     Pulses:          Dorsalis pedis pulses are 1+ on the right side and 1+ on the left side.     Heart sounds: No murmur heard. Pulmonary:     Effort: Pulmonary effort is normal. No respiratory distress.     Breath sounds: Normal breath sounds. No wheezing.  Abdominal:     General: Abdomen is flat. There is no distension.  Musculoskeletal:  General: Swelling present. No tenderness or signs of injury.     Right lower leg: No edema.     Left lower leg: Edema present.  Skin:    General: Skin is warm and dry.  Neurological:     General: No focal deficit present.     Mental Status: She is alert and oriented to person, place, and time.  Psychiatric:        Mood and Affect: Mood normal.        Behavior: Behavior normal.     The 10-year ASCVD risk score (Arnett DK, et al., 2019) is: 14.9%    Assessment & Plan:   Problem List Items Addressed This Visit       Unprioritized   Major depressive disorder, recurrent, moderate (HCC) (Chronic)    She notes good relief and good mood on current dose of sertraline.    Plan Continue sertraline.       Essential hypertension (Chronic)    BP is mildly elevated today.  Has been normotensive on this regimen.  Will re-evaluate at next visit in 3 months Continue current therapy, metoprolol, entresto, spironolactone      Relevant Medications   spironolactone (ALDACTONE) 25 MG tablet   Peripheral arterial occlusive disease (HCC) - Primary (Chronic)    She notes no leg pain, no issues taking her coumadin.  She is able to be on her feet and manager her daily activities  Continue atorvastatin and coumadin Follow up coumadin clinic      Relevant Medications   spironolactone (ALDACTONE) 25 MG tablet   Chronic systolic heart failure ([place])    Mildly increased swelling off of jardiance, however, patient was losing weight and having side effects with the jardiance.   Can consider increase spironolactone at next visit if worsening or weight gain is extensive.       Relevant Medications   spironolactone (ALDACTONE) 25 MG tablet   RESOLVED: Unintentional weight loss of more than 10% body weight within 6 months    Improved off of jardiance.  Will resolve.        Return in about 3 months (around 03/31/2023).    Gilles Chiquito, MD

## 2022-12-31 NOTE — Assessment & Plan Note (Signed)
BP is mildly elevated today.  Has been normotensive on this regimen.  Will re-evaluate at next visit in 3 months Continue current therapy, metoprolol, entresto, spironolactone

## 2022-12-31 NOTE — Assessment & Plan Note (Signed)
She notes good relief and good mood on current dose of sertraline.   Plan Continue sertraline.

## 2022-12-31 NOTE — Assessment & Plan Note (Signed)
Improved off of jardiance.  Will resolve.

## 2022-12-31 NOTE — Patient Instructions (Signed)
Ms. Lahrman - -  Please stay off of the jardiance.   Please keep an eye on swelling, if getting worse, please call the clinic.   You can try over the counter compression stockings for the legs to help when you are on your feet for a prolonged time.   Thank you!  Come back to see me in 3 months.

## 2022-12-31 NOTE — Assessment & Plan Note (Signed)
Mildly increased swelling off of jardiance, however, patient was losing weight and having side effects with the jardiance.   Can consider increase spironolactone at next visit if worsening or weight gain is extensive.

## 2022-12-31 NOTE — Assessment & Plan Note (Signed)
She notes no leg pain, no issues taking her coumadin.  She is able to be on her feet and manager her daily activities  Continue atorvastatin and coumadin Follow up coumadin clinic

## 2023-01-04 ENCOUNTER — Encounter: Payer: Self-pay | Admitting: Gastroenterology

## 2023-01-09 ENCOUNTER — Emergency Department (HOSPITAL_COMMUNITY): Payer: Medicare HMO

## 2023-01-09 ENCOUNTER — Observation Stay (HOSPITAL_COMMUNITY)
Admission: EM | Admit: 2023-01-09 | Discharge: 2023-01-11 | Disposition: A | Discharge status: Home / Self Care | Payer: Medicare HMO | Attending: Internal Medicine | Admitting: Internal Medicine

## 2023-01-09 ENCOUNTER — Encounter (HOSPITAL_COMMUNITY): Payer: Self-pay | Admitting: Emergency Medicine

## 2023-01-09 ENCOUNTER — Other Ambulatory Visit: Payer: Self-pay

## 2023-01-09 DIAGNOSIS — U071 COVID-19: Principal | ICD-10-CM | POA: Insufficient documentation

## 2023-01-09 DIAGNOSIS — G8929 Other chronic pain: Secondary | ICD-10-CM | POA: Diagnosis present

## 2023-01-09 DIAGNOSIS — M810 Age-related osteoporosis without current pathological fracture: Secondary | ICD-10-CM | POA: Diagnosis present

## 2023-01-09 DIAGNOSIS — Z87891 Personal history of nicotine dependence: Secondary | ICD-10-CM | POA: Insufficient documentation

## 2023-01-09 DIAGNOSIS — I110 Hypertensive heart disease with heart failure: Secondary | ICD-10-CM | POA: Insufficient documentation

## 2023-01-09 DIAGNOSIS — T82868A Thrombosis of vascular prosthetic devices, implants and grafts, initial encounter: Secondary | ICD-10-CM | POA: Diagnosis present

## 2023-01-09 DIAGNOSIS — Z79899 Other long term (current) drug therapy: Secondary | ICD-10-CM | POA: Diagnosis not present

## 2023-01-09 DIAGNOSIS — E8770 Fluid overload, unspecified: Secondary | ICD-10-CM | POA: Diagnosis not present

## 2023-01-09 DIAGNOSIS — I161 Hypertensive emergency: Secondary | ICD-10-CM | POA: Diagnosis not present

## 2023-01-09 DIAGNOSIS — I5022 Chronic systolic (congestive) heart failure: Secondary | ICD-10-CM | POA: Diagnosis not present

## 2023-01-09 DIAGNOSIS — I10 Essential (primary) hypertension: Secondary | ICD-10-CM | POA: Diagnosis present

## 2023-01-09 DIAGNOSIS — I779 Disorder of arteries and arterioles, unspecified: Secondary | ICD-10-CM | POA: Diagnosis present

## 2023-01-09 DIAGNOSIS — J449 Chronic obstructive pulmonary disease, unspecified: Secondary | ICD-10-CM | POA: Diagnosis not present

## 2023-01-09 DIAGNOSIS — R0602 Shortness of breath: Secondary | ICD-10-CM | POA: Diagnosis present

## 2023-01-09 DIAGNOSIS — F331 Major depressive disorder, recurrent, moderate: Secondary | ICD-10-CM | POA: Diagnosis present

## 2023-01-09 DIAGNOSIS — E872 Acidosis, unspecified: Secondary | ICD-10-CM | POA: Diagnosis not present

## 2023-01-09 DIAGNOSIS — R0603 Acute respiratory distress: Secondary | ICD-10-CM

## 2023-01-09 DIAGNOSIS — Z7901 Long term (current) use of anticoagulants: Secondary | ICD-10-CM | POA: Diagnosis not present

## 2023-01-09 DIAGNOSIS — M545 Low back pain, unspecified: Secondary | ICD-10-CM | POA: Diagnosis present

## 2023-01-09 LAB — CBC WITH DIFFERENTIAL/PLATELET
Abs Immature Granulocytes: 0.02 10*3/uL (ref 0.00–0.07)
Basophils Absolute: 0 10*3/uL (ref 0.0–0.1)
Basophils Relative: 0 %
Eosinophils Absolute: 0 10*3/uL (ref 0.0–0.5)
Eosinophils Relative: 0 %
HCT: 43.5 % (ref 36.0–46.0)
Hemoglobin: 13.1 g/dL (ref 12.0–15.0)
Immature Granulocytes: 0 %
Lymphocytes Relative: 21 %
Lymphs Abs: 1.4 10*3/uL (ref 0.7–4.0)
MCH: 29.4 pg (ref 26.0–34.0)
MCHC: 30.1 g/dL (ref 30.0–36.0)
MCV: 97.5 fL (ref 80.0–100.0)
Monocytes Absolute: 0.6 10*3/uL (ref 0.1–1.0)
Monocytes Relative: 9 %
Neutro Abs: 4.6 10*3/uL (ref 1.7–7.7)
Neutrophils Relative %: 70 %
Platelets: 165 10*3/uL (ref 150–400)
RBC: 4.46 MIL/uL (ref 3.87–5.11)
RDW: 14.6 % (ref 11.5–15.5)
WBC: 6.6 10*3/uL (ref 4.0–10.5)
nRBC: 0 % (ref 0.0–0.2)

## 2023-01-09 LAB — COMPREHENSIVE METABOLIC PANEL
ALT: 57 U/L — ABNORMAL HIGH (ref 0–44)
AST: 93 U/L — ABNORMAL HIGH (ref 15–41)
Albumin: 3.9 g/dL (ref 3.5–5.0)
Alkaline Phosphatase: 104 U/L (ref 38–126)
Anion gap: 17 — ABNORMAL HIGH (ref 5–15)
BUN: 10 mg/dL (ref 8–23)
CO2: 21 mmol/L — ABNORMAL LOW (ref 22–32)
Calcium: 8.8 mg/dL — ABNORMAL LOW (ref 8.9–10.3)
Chloride: 99 mmol/L (ref 98–111)
Creatinine, Ser: 1.1 mg/dL — ABNORMAL HIGH (ref 0.44–1.00)
GFR, Estimated: 52 mL/min — ABNORMAL LOW (ref >60)
Glucose, Bld: 126 mg/dL — ABNORMAL HIGH (ref 70–99)
Potassium: 3.7 mmol/L (ref 3.5–5.1)
Sodium: 137 mmol/L (ref 135–145)
Total Bilirubin: 0.8 mg/dL (ref 0.3–1.2)
Total Protein: 7.1 g/dL (ref 6.5–8.1)

## 2023-01-09 LAB — I-STAT VENOUS BLOOD GAS, ED
Acid-Base Excess: 0 mmol/L (ref 0.0–2.0)
Bicarbonate: 29.5 mmol/L — ABNORMAL HIGH (ref 20.0–28.0)
Calcium, Ion: 1.14 mmol/L — ABNORMAL LOW (ref 1.15–1.40)
HCT: 41 % (ref 36.0–46.0)
Hemoglobin: 13.9 g/dL (ref 12.0–15.0)
O2 Saturation: 31 %
Potassium: 5.3 mmol/L — ABNORMAL HIGH (ref 3.5–5.1)
Sodium: 138 mmol/L (ref 135–145)
TCO2: 32 mmol/L (ref 22–32)
pCO2, Ven: 73.3 mmHg (ref 44–60)
pH, Ven: 7.212 — ABNORMAL LOW (ref 7.25–7.43)
pO2, Ven: 24 mmHg — CL (ref 32–45)

## 2023-01-09 LAB — URINALYSIS, ROUTINE W REFLEX MICROSCOPIC
Bacteria, UA: NONE SEEN
Bilirubin Urine: NEGATIVE
Glucose, UA: NEGATIVE mg/dL
Ketones, ur: NEGATIVE mg/dL
Leukocytes,Ua: NEGATIVE
Nitrite: NEGATIVE
Protein, ur: 30 mg/dL — AB
Specific Gravity, Urine: 1.008 (ref 1.005–1.030)
pH: 6 (ref 5.0–8.0)

## 2023-01-09 LAB — TROPONIN I (HIGH SENSITIVITY)
Troponin I (High Sensitivity): 5 ng/L (ref <18)
Troponin I (High Sensitivity): 8 ng/L (ref <18)

## 2023-01-09 LAB — LACTIC ACID, PLASMA
Lactic Acid, Venous: 5.9 mmol/L (ref 0.5–1.9)
Lactic Acid, Venous: 2.6 mmol/L (ref 0.5–1.9)

## 2023-01-09 LAB — RESP PANEL BY RT-PCR (RSV, FLU A&B, COVID)  RVPGX2
Influenza A by PCR: NEGATIVE
Influenza B by PCR: NEGATIVE
Resp Syncytial Virus by PCR: NEGATIVE
SARS Coronavirus 2 by RT PCR: POSITIVE — AB

## 2023-01-09 LAB — PROTIME-INR
INR: 2.6 — ABNORMAL HIGH (ref 0.8–1.2)
Prothrombin Time: 27.7 seconds — ABNORMAL HIGH (ref 11.4–15.2)

## 2023-01-09 LAB — BRAIN NATRIURETIC PEPTIDE: B Natriuretic Peptide: 4148.7 pg/mL — ABNORMAL HIGH (ref 0.0–100.0)

## 2023-01-09 MED ORDER — OXYCODONE-ACETAMINOPHEN 7.5-325 MG PO TABS: 1.0000 | ORAL_TABLET | Freq: Three times a day (TID) | ORAL | Status: DC | PRN

## 2023-01-09 MED ORDER — SENNOSIDES-DOCUSATE SODIUM 8.6-50 MG PO TABS: 1.0000 | ORAL_TABLET | Freq: Every evening | ORAL | Status: DC | PRN

## 2023-01-09 MED ORDER — LABETALOL HCL 5 MG/ML IV SOLN
10.0000 mg | Freq: Once | INTRAVENOUS | Status: AC
  Administered 2023-01-09: 10 mg via INTRAVENOUS
  Filled 2023-01-09: qty 4

## 2023-01-09 MED ORDER — FUROSEMIDE 10 MG/ML IJ SOLN
40.0000 mg | Freq: Once | INTRAMUSCULAR | Status: AC
  Administered 2023-01-09: 40 mg via INTRAVENOUS
  Filled 2023-01-09: qty 4

## 2023-01-09 MED ORDER — SERTRALINE HCL 50 MG PO TABS
50.0000 mg | ORAL_TABLET | Freq: Every day | ORAL | Status: DC
  Administered 2023-01-10 – 2023-01-11 (×2): 50 mg via ORAL
  Filled 2023-01-09 (×2): qty 1

## 2023-01-09 MED ORDER — ALBUTEROL SULFATE (2.5 MG/3ML) 0.083% IN NEBU: 3.0000 mL | INHALATION_SOLUTION | RESPIRATORY_TRACT | Status: DC | PRN

## 2023-01-09 MED ORDER — SACUBITRIL-VALSARTAN 97-103 MG PO TABS
1.0000 | ORAL_TABLET | Freq: Two times a day (BID) | ORAL | Status: DC
  Administered 2023-01-10 – 2023-01-11 (×4): 1 via ORAL
  Filled 2023-01-09 (×6): qty 1

## 2023-01-09 MED ORDER — WARFARIN - PHYSICIAN DOSING INPATIENT: Freq: Every day | Status: DC

## 2023-01-09 MED ORDER — DEXAMETHASONE 6 MG PO TABS
6.0000 mg | ORAL_TABLET | Freq: Every day | ORAL | Status: DC
  Administered 2023-01-09 – 2023-01-11 (×3): 6 mg via ORAL
  Filled 2023-01-09 (×2): qty 1
  Filled 2023-01-09: qty 2

## 2023-01-09 MED ORDER — SPIRONOLACTONE 25 MG PO TABS
25.0000 mg | ORAL_TABLET | Freq: Every day | ORAL | Status: DC
  Administered 2023-01-10 – 2023-01-11 (×2): 25 mg via ORAL
  Filled 2023-01-09 (×2): qty 1

## 2023-01-09 MED ORDER — WARFARIN SODIUM 4 MG PO TABS
4.0000 mg | ORAL_TABLET | Freq: Every day | ORAL | Status: DC
  Administered 2023-01-10: 4 mg via ORAL
  Filled 2023-01-09: qty 1

## 2023-01-09 MED ORDER — METOPROLOL SUCCINATE ER 25 MG PO TB24
25.0000 mg | ORAL_TABLET | Freq: Every day | ORAL | Status: DC
  Administered 2023-01-10 – 2023-01-11 (×2): 25 mg via ORAL
  Filled 2023-01-09 (×2): qty 1

## 2023-01-09 MED ORDER — ATORVASTATIN CALCIUM 40 MG PO TABS
40.0000 mg | ORAL_TABLET | Freq: Every day | ORAL | Status: DC
  Administered 2023-01-10 – 2023-01-11 (×2): 40 mg via ORAL
  Filled 2023-01-09 (×2): qty 1

## 2023-01-09 NOTE — ED Notes (Signed)
The pt remains alert and oriented   she looks better her bp I lower  now on a nasal can  instead of bi-pap  another daughter at  the bedside  temp checked

## 2023-01-09 NOTE — ED Notes (Signed)
Order for urine lready sent and resulted

## 2023-01-09 NOTE — ED Triage Notes (Signed)
Pt here from home with c/o sob since yesterday  along with htn hx of copd and chf , pt was given 1 nitro by ems , arrived on [facility]B and placed on bipap on arrival to the ED

## 2023-01-09 NOTE — Progress Notes (Signed)
Pt taken off BiPAP due to pt unable to tolerate and inability to get a good seal on BiPAP mask. Pt placed on 3L [place], pt tolerating well at this time, SPO2 100%, MD aware, BiPAP at bedside in stand by, RN aware, RT will monitor.

## 2023-01-09 NOTE — ED Notes (Signed)
Admitting team at the bedside.

## 2023-01-09 NOTE — H&P (Cosign Needed Addendum)
Date: 01/09/2023               Patient Name:  Kristin Knight MRN: [id number]  DOB: 04/03/1946 Age / Sex: 77 y.o., female   PCP: Sid Falcon, MD         Medical Service: Internal Medicine Teaching Service         Attending Physician: Dr. Lottie Mussel, MD      First Contact: Dr. Gaylyn Rong, MD Pager [phone]    Second Contact: Dr. 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  The patient was her in usual state of health until yesterday, when she noticed that she was getting short of breath on exertion. At the same time, she also noticed a cough associated with a lot of yellow phlegm and nasal congestion. Before yesterday, she was feeling normal and denies any recent sick contacts. The patient denies any fevers, chills, chest pain, orthopnea, PND, n/v, abd pain, or any urinary symptoms. She does endorse 1 loose stool today, but no diarrhea and no blood in her stool. The patient also states that her legs are are more swollen than usual, and this feels like it is getting worse to the point that her legs are now firm. She does not have any pain in her legs.    Of note, the patient got the flu shot this year, but did not get a covid booster. Got 2 doses of covid vaccine previously.   ED Course:  Patient arrived via EMS and was given 1 of nitro for SBP 200s. She was placed on BiPAP in the ED due to respiratory distress, although did not tolerate and was placed on 3 L [place].   CBC  unremarkable. CMP with bicarb of 21, AST/ALT 93/57. Troponin negative x2. INR 2.6. Lactic acid 5.9 then 2.6. BNP elevated to 4148. VBG with pCO2 of 73.    Past Medical History HFrecEF Hypertension  Osteoporosis COPD Chronic venous insufficiency PAOD   Meds:  Albuterol  Alendronate 70 mg per week  Atorvastatin 40 mg daily Lasix 40 mg daily Metoprolol XL 25 mg daily Percocet 7.5-325 2-3 times a day prn  Entresto 200 mg bid Zoloft 50 mg daily Spironolactone 25 mg daily Warfarin 4 mg daily   No outpatient medications have been marked as taking for the 01/09/23 encounter [facility] Encounter).    Past Surgical History Past Surgical History:  Procedure Laterality Date   ABDOMINAL AORTOGRAM W/LOWER EXTREMITY N/A 07/25/2018   Procedure: ABDOMINAL AORTOGRAM W/LOWER EXTREMITY;  Surgeon: Serafina Mitchell, MD;  Location: [facility];  Service: Cardiovascular;  Laterality: N/A;   ABDOMINAL HYSTERECTOMY     H/O partial 1974   FEMORAL ARTERY - POPLITEAL ARTERY BYPASS GRAFT  12-09-2010   FEMORAL-POPLITEAL BYPASS GRAFT Left 09/28/2013   Procedure: Thrombectomy and Revision BYPASS GRAFT FEMORAL-POPLITEAL ARTERY;  Surgeon: Angelia Mould, MD;  Location: [facility];  Service: Vascular;  Laterality: Left;   INTRAOPERATIVE ARTERIOGRAM Left 09/28/2013   Procedure: INTRA OPERATIVE ARTERIOGRAM;  Surgeon: Angelia Mould, MD;  Location: [facility];  Service: Vascular;  Laterality: Left;   LEFT HEART CATH AND CORONARY ANGIOGRAPHY N/A 10/05/2021   Procedure: LEFT HEART CATH AND CORONARY ANGIOGRAPHY;  Surgeon: Larey Dresser, MD;  Location: [place] [facility];  Service: Cardiovascular;  Laterality: N/A;   LOWER EXTREMITY ANGIOGRAPHY Bilateral 07/26/2018   Procedure: LOWER EXTREMITY ANGIOGRAPHY - LYSIS RECHECK;  Surgeon: Marty Heck, MD;  Location: [facility];  Service: Cardiovascular;  Laterality: Bilateral;   PERIPHERAL VASCULAR BALLOON ANGIOPLASTY Left 07/26/2018   Procedure:  PERIPHERAL VASCULAR BALLOON ANGIOPLASTY;  Surgeon: Marty Heck, MD;  Location: [facility];  Service: Cardiovascular;  Laterality: Left;  Fem pop bypass    Social:  Lives With: one of her sons in 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 Daryll Drown Substances: Quit smoking 4 years ago - was smoking at most, 1.5 packs x 20 years (estimated 30 pack years); no alcohol or illicit substance use   Family History:  Significant cardiac history (father, brother, and sister have had heart attacks) Diabetes (sister) Breast cancer (mother)  Allergies: Allergies as of 01/09/2023 - Review Complete 01/09/2023  Allergen Reaction Noted   Penicillins Anaphylaxis and Other (See Comments)    Meperidine hcl Other (See Comments)    Chantix [varenicline] Nausea And Vomiting 03/12/2016    Review of Systems: A complete ROS was negative except as per HPI.   OBJECTIVE:   Physical Exam: Blood pressure (!) 144/82, pulse 88, temperature 100.1 F (37.8 C), temperature source Rectal, resp. rate (!) 26, last menstrual period 01/13/1974, SpO2 100 %.   Constitutional: Elderly appearing female laying in bed, in no acute distress Cardiovascular: regular rate and rhythm, no m/r/g, no JVD appreciated Pulmonary/Chest: slightly increased work of breathing on 2-3 L [place], bibasilar rales appreciated, no wheezing.  Abdominal: soft, non-tender, non-distended MSK: normal bulk and tone, 1+ bilateral lower extremity pitting edema Neurological: alert & oriented x 3, no focal deficit Skin: warm and dry Psych: normal mood and affect   Labs: CBC    Component Value Date/Time   WBC 6.6 01/09/2023 1531   RBC 4.46 01/09/2023 1531   HGB 13.9 01/09/2023 1701   HGB 13.4 10/22/2022 0940   HCT 41.0 01/09/2023 1701   HCT 39.7 10/22/2022 0940   PLT 165 01/09/2023 1531   PLT 217 10/22/2022 0940   MCV 97.5  01/09/2023 1531   MCV 90 10/22/2022 0940   MCH 29.4 01/09/2023 1531   MCHC 30.1 01/09/2023 1531   RDW 14.6 01/09/2023 1531   RDW 12.8 10/22/2022 0940   LYMPHSABS 1.4 01/09/2023 1531   LYMPHSABS 1.2 10/22/2022 0940   MONOABS 0.6 01/09/2023 1531   EOSABS 0.0 01/09/2023 1531   EOSABS 0.0 10/22/2022 0940   BASOSABS 0.0 01/09/2023 1531   BASOSABS 0.0 10/22/2022 0940     CMP     Component Value Date/Time   NA 138 01/09/2023 1701   NA 143 10/22/2022 0940   K 5.3 (H) 01/09/2023 1701   CL 99 01/09/2023 1531   CO2 21 (L) 01/09/2023 1531   GLUCOSE 126 (H) 01/09/2023 1531   BUN 10 01/09/2023 1531   BUN 10 10/22/2022 0940   CREATININE 1.10 (H) 01/09/2023 1531   CREATININE 0.95 04/04/2015 1017   CALCIUM 8.8 (L) 01/09/2023 1531   PROT 7.1 01/09/2023 1531   PROT 7.7 06/11/2022  1134   ALBUMIN 3.9 01/09/2023 1531   ALBUMIN 4.7 06/11/2022 1134   AST 93 (H) 01/09/2023 1531   ALT 57 (H) 01/09/2023 1531   ALKPHOS 104 01/09/2023 1531   BILITOT 0.8 01/09/2023 1531   BILITOT 0.4 06/11/2022 1134   GFRNONAA 52 (L) 01/09/2023 1531   GFRNONAA 62 04/04/2015 1017   GFRAA 63 12/26/2020 1118   GFRAA 71 04/04/2015 1017    Imaging: CXR: no pleueral effusion noted (costophrenic angle visualized) with interstitial lung markings, unchanged compared to prior CXR.  EKG: personally reviewed my interpretation is sinus tachycardia with normal axis and no T wave changes. Qtc 532. When compared to prior EKG, rate is faster and QT is more prolonged.   ASSESSMENT & PLAN:   Assessment & Plan by Problem: Principal Problem:   COVID-19 Active Problems:   Major depressive disorder, recurrent, moderate (HCC)   Essential hypertension   Peripheral arterial occlusive disease ([place])   Long term current use of anticoagulant therapy   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HCC)   Osteoporosis   Chronic midline low back pain without sciatica   Chronic obstructive pulmonary disease (HCC)   Chronic systolic heart failure  (HCC)   Kristin Knight is a 77 y.o. 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 VBG in the ED significant for pCO2 of 73.3 and she was placed on BiPAP, but came off of this after <1 hr, as she did not tolerate it. She was placed on 3 L [place] and has been saturating at 95-95%. Did attempt to wean O2 requirement while evaluating the patient, however, her O2 sat did not remain >92%. Tested positive for COVID in the ED. CBC without leukocytosis and vitals are all stable now.  - Start decadron 6 mg daily until discharge  - Albuterol q4h PRN  - Wean O2 requirements as able - PT eval and treat - BiPAP if needed - Check ambulatory pulse ox in the morning - Blood cultures in process   Lactic acidosis Anion gap metabolic acidosis  Lactic acid elevated to 5.9, but improved to 2.6 without any IV fluids, as she was thought to be volume overloaded initially. Bicarb low on admission to 21. pCO2 on VBG elevated to 73- patient was placed on BiPAP temporarily.  - Repeat BMP in the morning  Chronic HFrecEF Echo in Oct 2022 with EF of 25-30%, improved on most recent echo (March 2023) with EF of 55-60% and normal LV diastolic function and no regional wall motion abnormalities. She saw Dr. Aundra Dubin with advanced HF team in Jan 2023, but has not seen cardiology since then. Was advised to continue her Metoprolol XL 25 mg daily, Entresto 20 mg bid, empagliflozin 10 mg daily, and lasix 40 mg daily at that visit, however, her empagliflozin was discontinued at her [facility] visit on 2/2 due to weight loss.  She has noted to have an increase in swelling  since then (seen at [facility] visit on 2/16), and was going to have her spironolactone dose increased in the future if this problem persisted.  Today, BNP elevated to >4000, although CXR does not show any pleural effusions. The patient got 1 dose  of IV lasix 40 mg in the ED. Suspect her LE edema is secondary to her empagliflozin being discontinued, and that her respiratory symptoms are secondary to COVID.  - Consider further IV diuresis in the AM vs restarting po lasix depending on volume status  - Resume home metoprolol, entresto, and spironolactone - Strict I&Os and daily weights - Daily BMP, Mg  Hypertension BP via EMS >200 and she was given a dose of nitro. SBP 200 upon arrival to ED, but responded well to one dose of IV labetalol 10 mg and IV lasix 40 mg as given by EDP. SBP improved to 160 and then downtrended to 130-140s.   - Resumed home metoprolol, entresto, and spiro as above  PAOD s/p left femoropopliteal bypass complicated by occlusion of bypass in 2013 and 2019 Chronic anticoagulation The patient has been on chronic warfarin therapy for her PAOD- follows with [facility] INR clinic about once a month. INR today is therapeutic, at 2.6 (goal 2-3). She takes warfarin 4 mg daily. Denies having any leg pain or any issues with her warfarin.  - Continue warfarin 4 mg daily - Continue home atorvastatin 40 mg daily  - Recheck INR in the morning  Chronic midline low back pain without sciatica   Patient takes percocet 7.5-325 mg two to three times a day, as needed, for her pain. This has been a long term medication since 2022. She does not appear to be on a bowel regimen at home. - Percocet 7.5-325 mg q8h prn  - Senna 1 tablet daily prn, can increase or schecdule, if needed, to prevent opioid induced constipation  Major depressive disorder On zoloft 50 mg daily at home and has been doing well with this dose. Continued home zoloft.   Osteoporosis On weekly alendronate (70 mg) as an outpatient. Will  resume at discharge.    Diet: Heart Healthy VTE:  Warfarin IVF: None,None Code: Full  Prior to Admission Living Arrangement: Home, living son Anticipated Discharge Location: Home Barriers to Discharge: O2 requirement, medical stability  Dispo: Admit patient to Observation with expected length of stay less than 2 midnights.  Signed: Dorethea Clan, DO Internal Medicine Resident PGY-2 01/09/2023, 11:06 PM   Please contact on call pager, weekdays after 5pm and weekends after 1pm, at [phone].

## 2023-01-09 NOTE — ED Provider Notes (Signed)
[facility] Provider Note   CSN: [id number] Arrival date & time: 01/09/23  1520     History  No chief complaint on file.   Kristin Knight is a 77 y.o. female.  The history is provided by the patient and medical records. No language interpreter was used.  Shortness of Breath Severity:  Severe Onset quality:  Gradual Duration:  2 days Timing:  Constant Progression:  Worsening Chronicity:  Recurrent Context: URI   Relieved by:  Nothing Worsened by:  Nothing Associated symptoms: cough and sputum production   Associated symptoms: no abdominal pain, no chest pain, no fever, no headaches, no neck pain, no vomiting and no wheezing   Risk factors: no hx of PE/DVT        Home Medications Prior to Admission medications   Medication Sig Start Date End Date Taking? Authorizing Provider  albuterol (VENTOLIN HFA) 108 (90 Base) MCG/ACT inhaler Inhale 2 puffs into the lungs every 6 (six) hours as needed for wheezing or shortness of breath.    [provider]  alendronate (FOSAMAX) 70 MG tablet Take 1 tablet (70 mg total) by mouth every 7 (seven) days. Take with a full glass of water on an empty stomach. 10/22/22 10/22/23  Sid Falcon, MD  atorvastatin (LIPITOR) 40 MG tablet Take 1 tablet by mouth once daily 02/03/22   Sid Falcon, MD  furosemide (LASIX) 40 MG tablet Take 1 tablet (40 mg total) by mouth daily. 11/30/21   Larey Dresser, MD  metoprolol succinate (TOPROL-XL) 25 MG 24 hr tablet Take 1 tablet by mouth once daily 12/24/21   Sid Falcon, MD  oxyCODONE-acetaminophen (PERCOCET) 7.5-325 MG tablet Take 1 tablet by mouth every 8 (eight) hours as needed for severe pain. 12/17/22   Sid Falcon, MD  sacubitril-valsartan (ENTRESTO) 97-103 MG Take 1 tablet by mouth 2 (two) times daily. 11/30/21   Larey Dresser, MD  sertraline (ZOLOFT) 50 MG tablet Take 1 tablet (50 mg total) by mouth daily. 10/22/22   Sid Falcon, MD   spironolactone (ALDACTONE) 25 MG tablet Take 1 tablet (25 mg total) by mouth daily. 12/31/22   Sid Falcon, MD  warfarin (COUMADIN) 4 MG tablet Take 1 tablet (4 mg total) by mouth daily at 4 PM. 12/27/22   Pennie Banter, RPH-CPP      Allergies    Penicillins, Meperidine hcl, and Chantix [varenicline]    Review of Systems   Review of Systems  Constitutional:  Positive for fatigue. Negative for chills and fever.  HENT:  Negative for congestion.   Eyes:  Negative for visual disturbance.  Respiratory:  Positive for cough, sputum production and shortness of breath. Negative for chest tightness and wheezing.   Cardiovascular:  Positive for leg swelling (bilateral edema). Negative for chest pain and palpitations.  Gastrointestinal:  Negative for abdominal pain, constipation, diarrhea, nausea and vomiting.  Genitourinary:  Negative for dysuria and flank pain.  Musculoskeletal:  Negative for back pain, neck pain and neck stiffness.  Skin:  Negative for wound.  Neurological:  Negative for dizziness, weakness, light-headedness and headaches.  Psychiatric/Behavioral:  Negative for agitation and confusion.   All other systems reviewed and are negative.   Physical Exam Updated Vital Signs BP (!) 123/91   Pulse 78   Temp 98.3 F (36.8 C) (Oral)   Resp (!) 23   LMP 01/13/1974   SpO2 100%  Physical Exam Vitals and nursing note reviewed.  Constitutional:      General: She is in acute distress.     Appearance: She is well-developed. She is ill-appearing. She is not toxic-appearing or diaphoretic.  HENT:     Head: Normocephalic and atraumatic.     Mouth/Throat:     Mouth: Mucous membranes are moist.  Eyes:     Conjunctiva/sclera: Conjunctivae normal.     Pupils: Pupils are equal, round, and reactive to light.  Cardiovascular:     Rate and Rhythm: Regular rhythm. Tachycardia present.     Heart sounds: No murmur heard. Pulmonary:     Effort: Tachypnea and respiratory distress present.      Breath sounds: Rhonchi and rales present. No wheezing.  Chest:     Chest wall: No tenderness.  Abdominal:     Palpations: Abdomen is soft.     Tenderness: There is no abdominal tenderness.  Musculoskeletal:        General: No swelling.     Cervical back: Neck supple.     Right lower leg: No tenderness. Edema present.     Left lower leg: No tenderness. Edema present.  Skin:    General: Skin is warm and dry.     Capillary Refill: Capillary refill takes less than 2 seconds.     Findings: No erythema.  Neurological:     General: No focal deficit present.     Mental Status: She is alert.  Psychiatric:        Mood and Affect: Mood normal.     ED Results / Procedures / Treatments   Labs (all labs ordered are listed, but only abnormal results are displayed) Labs Reviewed  RESP PANEL BY RT-PCR (RSV, FLU A&B, COVID)  RVPGX2 - Abnormal; Notable for the following components:      Result Value   SARS Coronavirus 2 by RT PCR POSITIVE (*)    All other components within normal limits  COMPREHENSIVE METABOLIC PANEL - Abnormal; Notable for the following components:   CO2 21 (*)    Glucose, Bld 126 (*)    Creatinine, Ser 1.10 (*)    Calcium 8.8 (*)    AST 93 (*)    ALT 57 (*)    GFR, Estimated 52 (*)    Anion gap 17 (*)    All other components within normal limits  BRAIN NATRIURETIC PEPTIDE - Abnormal; Notable for the following components:   B Natriuretic Peptide 4,148.7 (*)    All other components within normal limits  LACTIC ACID, PLASMA - Abnormal; Notable for the following components:   Lactic Acid, Venous 5.9 (*)    All other components within normal limits  LACTIC ACID, PLASMA - Abnormal; Notable for the following components:   Lactic Acid, Venous 2.6 (*)    All other components within normal limits  PROTIME-INR - Abnormal; Notable for the following components:   Prothrombin Time 27.7 (*)    INR 2.6 (*)    All other components within normal limits  URINALYSIS, ROUTINE W  REFLEX MICROSCOPIC - Abnormal; Notable for the following components:   Hgb urine dipstick SMALL (*)    Protein, ur 30 (*)    All other components within normal limits  I-STAT VENOUS BLOOD GAS, ED - Abnormal; Notable for the following components:   pH, Ven 7.212 (*)    pCO2, Ven 73.3 (*)    pO2, Ven 24 (*)    Bicarbonate 29.5 (*)    Potassium 5.3 (*)    Calcium, Ion 1.14 (*)  All other components within normal limits  CULTURE, BLOOD (ROUTINE X 2)  CULTURE, BLOOD (ROUTINE X 2)  CBC WITH DIFFERENTIAL/PLATELET  BLOOD GAS, VENOUS  BASIC METABOLIC PANEL  CBC  MAGNESIUM  PROTIME-INR  TROPONIN I (HIGH SENSITIVITY)  TROPONIN I (HIGH SENSITIVITY)    EKG None  Radiology DG Chest Portable 1 View  Result Date: 01/09/2023 CLINICAL DATA:  Shortness of breath EXAM: PORTABLE CHEST 1 VIEW COMPARISON:  12/11/2022 FINDINGS: Cardiomegaly. Aortic atherosclerosis. Hyperinflated lungs. Chronically coarsened interstitial markings. No focal airspace consolidation, pleural effusion, or pneumothorax. IMPRESSION: Cardiomegaly and COPD. No acute cardiopulmonary findings. Electronically Signed   By: Davina Poke D.O.   On: 01/09/2023 17:07    Procedures Procedures    CRITICAL CARE Performed by: Gwenyth Allegra Barrington Worley Total critical care time: 35 minutes Critical care time was exclusive of separately billable procedures and treating other patients. Critical care was necessary to treat or prevent imminent or life-threatening deterioration. Critical care was time spent personally by me on the following activities: development of treatment plan with patient and/or surrogate as well as nursing, discussions with consultants, evaluation of patient's response to treatment, examination of patient, obtaining history from patient or surrogate, ordering and performing treatments and interventions, ordering and review of laboratory studies, ordering and review of radiographic studies, pulse oximetry and  re-evaluation of patient's condition.  Medications Ordered in ED Medications  senna-docusate (Senokot-S) tablet 1 tablet (has no administration in time range)  atorvastatin (LIPITOR) tablet 40 mg (has no administration in time range)  oxyCODONE-acetaminophen (PERCOCET) 7.5-325 MG per tablet 1 tablet (has no administration in time range)  sacubitril-valsartan (ENTRESTO) 97-103 mg per tablet (has no administration in time range)  sertraline (ZOLOFT) tablet 50 mg (has no administration in time range)  spironolactone (ALDACTONE) tablet 25 mg (has no administration in time range)  warfarin (COUMADIN) tablet 4 mg (has no administration in time range)  metoprolol succinate (TOPROL-XL) 24 hr tablet 25 mg (has no administration in time range)  dexamethasone (DECADRON) tablet 6 mg (6 mg Oral Given 01/09/23 2255)  albuterol (PROVENTIL) (2.5 MG/3ML) 0.083% nebulizer solution 3 mL (has no administration in time range)  Warfarin - Physician Dosing Inpatient (has no administration in time range)  labetalol (NORMODYNE) injection 10 mg (10 mg Intravenous Given 01/09/23 1614)  furosemide (LASIX) injection 40 mg (40 mg Intravenous Given 01/09/23 1824)    ED Course/ Medical Decision Making/ A&P                             Medical Decision Making Amount and/or Complexity of Data Reviewed Labs: ordered. Radiology: ordered.  Risk Prescription drug management. Decision regarding hospitalization.    Kristin Knight is a 77 y.o. 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  Patient denies any chest pain or palpitations reports has had worsening shortness of breath and increased work of breathing or sore distress.   EMS arrived, ox saturations around 90% however she was breathing very quickly reportedly in the 30s and 40s.  She was placed on nonrebreather and brought in for evaluation.  She was given a nitro and her blood pressure is over 200 and now is around 200 on arrival.  She says she has had no fevers or chills  but has had productive cough that is new.  She reports no constipation, diarrhea, or urinary changes.  No leg pain reported.  No abdominal pain, or other complaints.  On medical exam, patient is breathing very quickly.  She is tachycardic around 1 15-1 20.  Blood pressure was around 200.  She has oxygen saturation in the mid 90s on 15 L; to try to place her on BiPAP to help with her work of breathing.  She was not wheezing but did have the rhonchi and rales.  Will hold on breathing treatments initially but I do feel need to address her elevated blood pressure.  Patient did have palpable pulses in lower extremities on my exam.  Patient was given nitro by EMS that did not seem to do much.  Patient is on beta-blockers, give a dose of IV beta-blocker to see if this helps given her heart rate and blood pressure.  We will get chest x-ray to rule out pneumonia versus fluid overload.  Will get cardiac enzymes and a BNP.  Get labs.  Due to her report of cough we will make sure we have lactic acid and rectal temperature make sure it is not infectious in etiology.  Due to patient's increased work of breathing and needing BiPAP now, anticipate admission after workup is completed.  Will try to de-escalate patient from BiPAP if she is stable.  Anticipate admission after workup.   Patient blood pressure improved after the medication and blood pressure in the 150s.  She did not want to continue wearing the BiPAP and took it off with respiratory.  She is now on nasal cannula oxygen but is more comfortable.  Will allow her to stay off of BiPAP for now and see how she does.  Will wait for workup to return prior to  admission.  5:49 PM Workup continues to return.  X-ray did not show pneumonia.  Viral testing is still in process however I do suspect this is a combination of fluid overload and pulmonary edema related to elevated blood pressure.  Blood pressure is now in the 140s and doing much better.  She is off BiPAP and breathing better.  Her blood gas did show some acidosis and her CO2 was elevated at 73.  Unsure what her baseline is but she is mentating well now.  She says she does not want to be on BiPAP again right now but may end up needing it during her admission for the hypercarbia.  Her BNP is much more elevated at over 4000.  Will order some diuretic for as her creatinine is only 1.1.  Otherwise her CBC was reassuring with no leukocytosis.  I am not given she has pneumonia at this time given the reassuring x-ray and normal white count.  Troponin normal.  Lactic acid was elevated at 5.9, will trend.  Will hold on fluids as I am concerned about significant fluid overload clinically.  Will call for admission and await results of viral testing.  Patient tested positive for COVID-19.  She will be admitted.        Final Clinical Impression(s) / ED Diagnoses Final diagnoses:  Hypervolemia, unspecified hypervolemia type  Respiratory distress  Hypertensive emergency    Clinical Impression: 1. Hypervolemia, unspecified hypervolemia type   2. Respiratory distress   3. Hypertensive emergency     Disposition: Admit  This note was prepared with assistance of Dragon voice recognition software. Occasional wrong-word or sound-a-like substitutions may have occurred due to the inherent limitations of  voice recognition software.     Esau Fridman, Gwenyth Allegra, MD 01/09/23 [id number]

## 2023-01-09 NOTE — Progress Notes (Signed)
Pt placed on BiPAP by RT per MD. Pt tolerating well at this time, vitals stable, RN at beside, RT will monitor.

## 2023-01-10 ENCOUNTER — Encounter: Payer: Self-pay | Admitting: *Deleted

## 2023-01-10 ENCOUNTER — Other Ambulatory Visit: Payer: Self-pay

## 2023-01-10 DIAGNOSIS — U071 COVID-19: Secondary | ICD-10-CM | POA: Diagnosis not present

## 2023-01-10 DIAGNOSIS — E8770 Fluid overload, unspecified: Secondary | ICD-10-CM | POA: Diagnosis not present

## 2023-01-10 DIAGNOSIS — Z87891 Personal history of nicotine dependence: Secondary | ICD-10-CM | POA: Diagnosis not present

## 2023-01-10 LAB — BASIC METABOLIC PANEL
Anion gap: 11 (ref 5–15)
BUN: 14 mg/dL (ref 8–23)
CO2: 29 mmol/L (ref 22–32)
Calcium: 8.4 mg/dL — ABNORMAL LOW (ref 8.9–10.3)
Chloride: 98 mmol/L (ref 98–111)
Creatinine, Ser: 0.96 mg/dL (ref 0.44–1.00)
GFR, Estimated: >60 mL/min (ref >60)
Glucose, Bld: 153 mg/dL — ABNORMAL HIGH (ref 70–99)
Potassium: 3.7 mmol/L (ref 3.5–5.1)
Sodium: 138 mmol/L (ref 135–145)

## 2023-01-10 LAB — CBC
HCT: 36.8 % (ref 36.0–46.0)
Hemoglobin: 11.2 g/dL — ABNORMAL LOW (ref 12.0–15.0)
MCH: 28.6 pg (ref 26.0–34.0)
MCHC: 30.4 g/dL (ref 30.0–36.0)
MCV: 94.1 fL (ref 80.0–100.0)
Platelets: 142 10*3/uL — ABNORMAL LOW (ref 150–400)
RBC: 3.91 MIL/uL (ref 3.87–5.11)
RDW: 14.5 % (ref 11.5–15.5)
WBC: 7.1 10*3/uL (ref 4.0–10.5)
nRBC: 0 % (ref 0.0–0.2)

## 2023-01-10 LAB — PROTIME-INR
INR: 2.6 — ABNORMAL HIGH (ref 0.8–1.2)
Prothrombin Time: 27.8 seconds — ABNORMAL HIGH (ref 11.4–15.2)

## 2023-01-10 LAB — MAGNESIUM: Magnesium: 1.7 mg/dL (ref 1.7–2.4)

## 2023-01-10 MED ORDER — FUROSEMIDE 40 MG PO TABS
40.0000 mg | ORAL_TABLET | Freq: Once | ORAL | Status: AC
  Administered 2023-01-10: 40 mg via ORAL
  Filled 2023-01-10: qty 1

## 2023-01-10 MED ORDER — FUROSEMIDE 40 MG PO TABS
40.0000 mg | ORAL_TABLET | Freq: Every day | ORAL | Status: DC
  Administered 2023-01-11: 40 mg via ORAL
  Filled 2023-01-10: qty 1

## 2023-01-10 NOTE — Evaluation (Signed)
Physical Therapy Evaluation Patient Details Name: Kristin Knight MRN: [id number] DOB: 08/03/1946 Today's Date: 01/10/2023  History of Present Illness  77 y.o. female presents to [facility] hospital on 01/09/2023 with SOB and cough. Pt found to be COVID+. PMH includes HFrecEF, HTN, osteoporosis, COPD, PAD.  Clinical Impression  Pt presents to PT with mild deficits in endurance, however she is able to mobilize independently at this time. Pt ambulates multiple laps around the room without reports of SOB or DOE. PT encourages frequent mobilization in an effort to improve activity tolerance and restore her prior level of function. Pt has no further acute PT needs at this time. Acute PT signing off.       Recommendations for follow up therapy are one component of a multi-disciplinary discharge planning process, led by the attending physician.  Recommendations may be updated based on patient status, additional functional criteria and insurance authorization.  Follow Up Recommendations No PT follow up      Assistance Recommended at Discharge None  Patient can return home with the following       Equipment Recommendations None recommended by PT  Recommendations for Other Services       Functional Status Assessment Patient has had a recent decline in their functional status and demonstrates the ability to make significant improvements in function in a reasonable and predictable amount of time.     Precautions / Restrictions Precautions Precautions: Other (comment) Precaution Comments: COVID+ Restrictions Weight Bearing Restrictions: No      Mobility  Bed Mobility Overal bed mobility: Independent                  Transfers Overall transfer level: Independent Equipment used: None                    Ambulation/Gait Ambulation/Gait assistance: Independent Gait Distance (Feet): 200 Feet Assistive device: None Gait Pattern/deviations: WFL(Within Functional Limits) Gait  velocity: functional Gait velocity interpretation: 1.31 - 2.62 ft/sec, indicative of limited community ambulator   General Gait Details: steady step-through gait, pt denies DOE  Financial trader Rankin (Stroke Patients Only)       Balance Overall balance assessment: Independent                                           Pertinent Vitals/Pain Pain Assessment Pain Assessment: No/denies pain    Home Living Family/patient expects to be discharged to:: Private residence Living Arrangements: Children Available Help at Discharge: Family Type of Home: House Home Access: Stairs to enter Entrance Stairs-Rails: None Technical brewer of Steps: 1   Home Layout: One level Home Equipment: None      Prior Function Prior Level of Function : Independent/Modified Independent             Mobility Comments: works 4 days/wk, 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       Hand Dominance   Dominant Hand: Left    Extremity/Trunk Assessment   Upper Extremity Assessment Upper Extremity Assessment: Overall WFL for tasks assessed    Lower Extremity Assessment Lower Extremity Assessment: Overall WFL for tasks assessed    Cervical / Trunk Assessment Cervical / Trunk Assessment: Normal  Communication   Communication: No difficulties  Cognition Arousal/Alertness: Awake/alert Behavior During Therapy: WFL for tasks assessed/performed Overall Cognitive  Status: Within Functional Limits for tasks assessed                                          General Comments General comments (skin integrity, edema, etc.): VSS on RA when mobilizing, sats in high 90s, no reports of SOB or DOE    Exercises     Assessment/Plan    PT Assessment Patient does not need any further PT services  PT Problem List         PT Treatment Interventions      PT Goals (Current goals can be found in the Care Plan section)        Frequency       Co-evaluation               AM-PAC PT "6 Clicks" Mobility  Outcome Measure Help needed turning from your back to your side while in a flat bed without using bedrails?: None Help needed moving from lying on your back to sitting on the side of a flat bed without using bedrails?: None Help needed moving to and from a bed to a chair (including a wheelchair)?: None Help needed standing up from a chair using your arms (e.g., wheelchair or bedside chair)?: None Help needed to walk in hospital room?: None Help needed climbing 3-5 steps with a railing? : None 6 Click Score: 24    End of Session   Activity Tolerance: Patient tolerated treatment well Patient left: in chair;with call bell/phone within reach Nurse Communication: Mobility status PT Visit Diagnosis: Other (comment) (cardiopulmonary deficits)    Time: [id number] PT Time Calculation (min) (ACUTE ONLY): 10 min   Charges:   PT Evaluation $PT Eval Low Complexity: [place], PT, DPT Acute Rehabilitation Office [phone]   Zenaida Niece 01/10/2023, 11:26 AM

## 2023-01-10 NOTE — Progress Notes (Signed)
Subjective:   Summary: LADONIA PIETRI is a 77 y.o. year old female currently admitted on the IMTS HD#0 for COVID infection.  Overnight Events: Hemodynamically stable and satting well on 2 L nasal cannula.  Evaluated by PT, no follow-up needs identified.  No new acute symptoms.  Feels like she is doing better but not quite at baseline.  Please see Dr. Guerry Bruin H&P attestation from today for further details.    Objective:  Vital signs in last 24 hours: Vitals:   01/10/23 1006 01/10/23 1100 01/10/23 1200 01/10/23 1600  BP: (!) 135/99 (!) 136/96 (!) 145/90   Pulse: 76 65 62   Resp:  20 (!) 23   Temp:  98.2 F (36.8 C)  98.1 F (36.7 C)  TempSrc:  Oral  Oral  SpO2:  96% 97%    Supplemental O2: Nasal Cannula SpO2: 97 % O2 Flow Rate (L/min): 2 L/min FiO2 (%): 50 %   Physical Exam:  Please see Dr. Guerry Bruin note from today for further exam details. Constitutional: NAD Pulmonary/Chest: normal work of breathing on 2LNC  There were no vitals filed for this visit.   Intake/Output Summary (Last 24 hours) at 01/10/2023 1736 Last data filed at 01/10/2023 1600 Gross per 24 hour  Intake 240 ml  Output 200 ml  Net 40 ml   Net IO Since Admission: 40 mL [01/10/23 1736]  Pertinent Labs:    Latest Ref Rng & Units 01/10/2023    4:00 AM 01/09/2023    5:01 PM 01/09/2023    3:31 PM  CBC  WBC 4.0 - 10.5 K/uL 7.1   6.6   Hemoglobin 12.0 - 15.0 g/dL 11.2  13.9  13.1   Hematocrit 36.0 - 46.0 % 36.8  41.0  43.5   Platelets 150 - 400 K/uL 142   165        Latest Ref Rng & Units 01/10/2023    4:00 AM 01/09/2023    5:01 PM 01/09/2023    3:31 PM  CMP  Glucose 70 - 99 mg/dL 153   126   BUN 8 - 23 mg/dL 14   10   Creatinine 0.44 - 1.00 mg/dL 0.96   1.10   Sodium 135 - 145 mmol/L 138  138  137   Potassium 3.5 - 5.1 mmol/L 3.7  5.3  3.7   Chloride 98 - 111 mmol/L 98   99   CO2 22 - 32 mmol/L 29   21   Calcium 8.9 - 10.3 mg/dL 8.4   8.8   Total Protein 6.5 - 8.1 g/dL   7.1    Total Bilirubin 0.3 - 1.2 mg/dL   0.8   Alkaline Phos 38 - 126 U/L   104   AST 15 - 41 U/L   93   ALT 0 - 44 U/L   57   Mag 1.7 INR 2.6 Lactic acid 5.9 >2.6 Troponins unremarkable BCx showing no growth in 24 hours BMP 4148.  Was 3264 a month ago   Imaging: CXR: Lungs hyperinflated.  Cardiomegaly.  Increased interstitial markings.  No focal opacity.  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  COVID-19 COPD Presenting with positive COVID test after 1 day of symptoms.  Initially required BiPAP in ED but has been weaned to 2 L nasal cannula and then weaned to room air.  Presentation not concerning for ARDS or pneumonia.  Does not meet criteria for COPD exacerbation.  Is feeling much better, can likely DC tomorrow.  No PT follow-up needed - decadron 6 mg daily until discharge  - Albuterol q4h PRN  -Supplemental O2 as needed - Blood cultures in process    Lactic acidosis Anion gap metabolic acidosis  Lactic acidosis improved overnight before fluids.  Acidosis resolved and as of morning BMP.  Clinically she is significantly better.  Will CTM - Repeat BMP in the morning   Chronic  HFrecEF Initial BNP elevated.  Has some lower extremity swelling but not significant edema on exam.  Respiratory status is stable and improving.  Believe her initial respiratory distress was due to COVID and not heart failure exacerbation and she is euvolemic.  Transitioning to p.o. 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  INR remains therapeutic (goal 2-3). She takes warfarin 4 mg daily. Denies having any leg pain or any issues with her warfarin.  - Continue warfarin 4 mg daily - Continue home atorvastatin 40 mg daily   Chronic midline low back pain without sciatica   Patient takes percocet 7.5-325 mg two to three times a day, as needed, for her pain. This has been a long term medication since 2022. She does not appear to be on a bowel regimen at home. - Percocet 7.5-325 mg q8h prn  - Senna 1 tablet daily prn, can increase or schecdule, if needed, to prevent opioid induced constipation   Major depressive disorder On zoloft 50 mg daily at home and has been doing well with this dose. Continued home zoloft.    Osteoporosis On weekly alendronate (70 mg) as an outpatient. Will resume at discharge.   Diet: Heart Healthy IVF: None, VTE:  Warfarin Code: Full PT/OT recs:  No PT follow-up , . TOC recs:  Family Update:   Dispo: Anticipated discharge to Home in 1 days pending clinical stability.   Linus Galas, MD PGY-1 Internal Medicine  Resident Please contact the on call pager after 5 pm and on weekends at [phone].
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
## 2023-01-11 LAB — CBC
HCT: 35.5 % — ABNORMAL LOW (ref 36.0–46.0)
Hemoglobin: 11.1 g/dL — ABNORMAL LOW (ref 12.0–15.0)
MCH: 28.8 pg (ref 26.0–34.0)
MCHC: 31.3 g/dL (ref 30.0–36.0)
MCV: 92.2 fL (ref 80.0–100.0)
Platelets: 140 10*3/uL — ABNORMAL LOW (ref 150–400)
RBC: 3.85 MIL/uL — ABNORMAL LOW (ref 3.87–5.11)
RDW: 14.4 % (ref 11.5–15.5)
WBC: 9.3 10*3/uL (ref 4.0–10.5)
nRBC: 0 % (ref 0.0–0.2)

## 2023-01-11 LAB — BASIC METABOLIC PANEL
Anion gap: 10 (ref 5–15)
BUN: 18 mg/dL (ref 8–23)
CO2: 29 mmol/L (ref 22–32)
Calcium: 8.9 mg/dL (ref 8.9–10.3)
Chloride: 98 mmol/L (ref 98–111)
Creatinine, Ser: 0.73 mg/dL (ref 0.44–1.00)
GFR, Estimated: >60 mL/min (ref >60)
Glucose, Bld: 132 mg/dL — ABNORMAL HIGH (ref 70–99)
Potassium: 4.1 mmol/L (ref 3.5–5.1)
Sodium: 137 mmol/L (ref 135–145)

## 2023-01-11 LAB — PROTIME-INR
INR: 3.1 — ABNORMAL HIGH (ref 0.8–1.2)
Prothrombin Time: 31.7 seconds — ABNORMAL HIGH (ref 11.4–15.2)

## 2023-01-11 MED ORDER — WARFARIN SODIUM 4 MG PO TABS: ORAL_TABLET | ORAL | 2 refills | Status: DC

## 2023-01-11 MED ORDER — WARFARIN SODIUM 2 MG PO TABS
2.0000 mg | ORAL_TABLET | Freq: Every day | ORAL | Status: DC
  Administered 2023-01-11: 2 mg via ORAL
  Filled 2023-01-11: qty 1

## 2023-01-11 MED ORDER — ORAL CARE MOUTH RINSE: 15.0000 mL | OROMUCOSAL | Status: DC | PRN

## 2023-01-11 NOTE — Hospital Course (Signed)
She is feeling fine, just a little pain in her back. She was able to work well with PT. Did not eat all of her breakfast because it was cold. No diarrhea. No N/V. She is nervous but okay with going home today. Her daughter would come to pick her up from here.

## 2023-01-11 NOTE — Discharge Summary (Signed)
Name: Kristin Knight MRN: [id number] DOB: 1946/02/27 77 y.o. PCP: Sid Falcon, MD  Date of Admission: 01/09/2023  3:20 PM Date of Discharge: 01/11/2023 Attending Physician: Dr.  Cain Sieve  Discharge Diagnosis: Principal Problem:   COVID-19 Active Problems:   Major depressive disorder, recurrent, moderate ([place])   Essential hypertension   Peripheral arterial occlusive disease ([place])   Long term current use of anticoagulant therapy   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HCC)   Osteoporosis   Chronic midline low back pain without sciatica   Chronic obstructive pulmonary disease ([place])   Chronic systolic heart failure ([place])   Hypervolemia    Discharge Medications: Allergies as of 01/11/2023       Reactions   Penicillins Anaphylaxis, Other (See Comments)   Patient passed out Has patient had a PCN reaction causing immediate rash, facial/tongue/throat swelling, SOB or lightheadedness with hypotension: No Has patient had a PCN reaction causing severe rash involving mucus membranes or skin necrosis: No Has patient had a PCN reaction that required hospitalization: No Has patient had a PCN reaction occurring within the last 10 years: No If all of the above answers are "NO", then may proceed with Cephalosporin use.   Meperidine Hcl Other (See Comments)   nevousness (Demerol)   Chantix [varenicline] Nausea And Vomiting        Medication List     TAKE these medications    albuterol 108 (90 Base) MCG/ACT inhaler Commonly known as: VENTOLIN HFA Inhale 2 puffs into the lungs every 6 (six) hours as needed for wheezing or shortness of breath.   alendronate 70 MG tablet Commonly known as: Fosamax Take 1 tablet (70 mg total) by mouth every 7 (seven) days. Take with a full glass of water on an empty stomach.   atorvastatin 40 MG tablet Commonly known as: LIPITOR Take 1 tablet by mouth once daily   Entresto 97-103 MG Generic drug: sacubitril-valsartan Take 1 tablet by mouth 2 (two) times  daily.   furosemide 40 MG tablet Commonly known as: LASIX Take 1 tablet (40 mg total) by mouth daily.   metoprolol succinate 25 MG 24 hr tablet Commonly known as: TOPROL-XL Take 1 tablet by mouth once daily   oxyCODONE-acetaminophen 7.5-325 MG tablet Commonly known as: PERCOCET Take 1 tablet by mouth every 8 (eight) hours as needed for severe pain.   sertraline 50 MG tablet Commonly known as: ZOLOFT Take 1 tablet (50 mg total) by mouth daily.   spironolactone 25 MG tablet Commonly known as: ALDACTONE Take 1 tablet (25 mg total) by mouth daily.   warfarin 4 MG tablet Commonly known as: COUMADIN Take as directed. If you are unsure how to take this medication, talk to your nurse or doctor. Original instructions: Please take '4mg'$  (one tablet) on 2/28, '2mg'$  (1/2 tablet) on 2/29, '4mg'$  (one tablet) on 3/1, '2mg'$  (1/2 tablet) on 3/2, '4mg'$  (one tablet) on 3/3 and then follow up with Dr. Elie Confer on 3/4 for dosing changes. What changed:  how much to take how to take this when to take this additional instructions        Disposition and follow-up:   Kristin Knight was discharged from [facility] in Good condition.  At the hospital follow up visit please address:  1.  Follow-up:  a. Please reassess respiratory status and provide further guidance for covid isolation as appropriate (d/ced day 3/5 after symptom onset)    b.Please make sure she is taking the appropriate dosage of warfarin (alternating '4mg'$   and '2mg'$  doses daily starting with '4mg'$  on 2/28 per Dr. Elie Confer  2.  Labs / imaging needed at time of follow-up: INR  3.  Pending labs/ test needing follow-up: blood cultures  Follow-up Appointments:  Follow-up Information     [place]. Go on 01/14/2023.   Why: Please arrive 30 minutes early for your appointment at 9:45 am. Contact information: [address] [place]  Course by problem list: Kristin Knight is a 77 y.o. 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   Acute hypoxic Respiratory failure 2/2 COVID-19 COPD Presenting with positive COVID test after 1 day of symptoms. Initiated steroids due to hypoxia. She is vaccinated x2, unclear evidence that antivirals would provide significant benefit so not started. Initially required BiPAP in ED but was weaned to 2 L nasal cannula and then weaned to room air over first day of admission.  Presentation not concerning for ARDS or pneumonia.  Does not meet criteria for COPD exacerbation. Was feeling much better on hospital day 2 and oxygenating well on room air. No PT follow up needed, no supplemental O2 needed with ambulation. Can continue current COPD regimen at dc.  Lactic acidosis Anion gap metabolic acidosis  Lactic acid elevated to 5.9, but improved to 2.6 without any IV fluids, as she was thought to be volume overloaded initially. Bicarb low on admission to 21. pCO2 on VBG elevated to 73- patient was placed on BiPAP temporarily. Acidosis resolved on day 1.   Chronic HFrecEF Initial BNP elevated.  Has some lower extremity swelling but not significant edema on exam.  Respiratory status is stable and improving.  Believe her initial respiratory distress was due to COVID and not heart failure exacerbation as she is euvolemic.  Received initial IV lasix 40 mg in ED and then transitioned back to PO lasix '40mg'$ . Continued all GDMT during admission.    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  She takes warfarin 4 mg daily. Denies having any leg pain or any issues with her warfarin. INR was slightly supratherapeutic on last day of admission (3.1, goal 2-3). Discussed with Dr. Elie Confer and he recommended decreasing dose today to '2mg'$  and then alternating '4mg'$  and '2mg'$  doses daily until she can be seen in clinic on 3/4.   Chronic midline low back pain without sciatica   Patient takes percocet 7.5-325 mg two to three times a day, as needed, for her pain. This has been a long term medication since 2022.    Major depressive disorder On zoloft 50 mg daily at home and has been doing well with this dose. Continued home zoloft.    Osteoporosis On weekly alendronate (70 mg) as an outpatient. Will resume at discharge.   Subjective: She is feeling fine, just a little pain in her back. She was able to work well with PT. Did not eat all of her breakfast because it was cold. No diarrhea.  No N/V. She is nervous but okay with going home today. Her daughter would come to pick her up from here. Discussed changes to warfarin regimen and she was able to repeat the appropriate doses to me.  Discharge Vitals:   BP 135/81 (BP Location: Left Arm)   Pulse 65   Temp 98 F (36.7 C) (Oral)   Resp 20   Wt 40 kg   LMP 01/13/1974   SpO2 99%   BMI 15.14 kg/m  Discharge exam: General: NAD CV: RRR. No murmurs, rubs, or gallops. No LE edema Pulmonary: Lungs CTAB. Normal effort. No wheezing or rales. Abdominal: Soft, nontender, nondistended. Normal bowel sounds. Extremities: Radial and DP pulses 2+ and symmetric. Normal ROM. Skin: Warm and dry. No obvious rash or lesions. Neuro: A&Ox3. Moves all extremities. Normal sensation. No focal deficit. Psych: Normal mood and affect   Pertinent Labs, Studies, and Procedures:     Latest Ref Rng & Units 01/11/2023    1:13 AM 01/10/2023    4:00 AM 01/09/2023    5:01 PM  CBC  WBC 4.0 - 10.5  K/uL 9.3  7.1    Hemoglobin 12.0 - 15.0 g/dL 11.1  11.2  13.9   Hematocrit 36.0 - 46.0 % 35.5  36.8  41.0   Platelets 150 - 400 K/uL 140  142         Latest Ref Rng & Units 01/11/2023    1:13 AM 01/10/2023    4:00 AM 01/09/2023    5:01 PM  CMP  Glucose 70 - 99 mg/dL 132  153    BUN 8 - 23 mg/dL 18  14    Creatinine 0.44 - 1.00 mg/dL 0.73  0.96    Sodium 135 - 145 mmol/L 137  138  138   Potassium 3.5 - 5.1 mmol/L 4.1  3.7  5.3   Chloride 98 - 111 mmol/L 98  98    CO2 22 - 32 mmol/L 29  29    Calcium 8.9 - 10.3 mg/dL 8.9  8.4      DG Chest Portable 1 View  Result Date: 01/09/2023 CLINICAL DATA:  Shortness of breath EXAM: PORTABLE CHEST 1 VIEW COMPARISON:  12/11/2022 FINDINGS: Cardiomegaly. Aortic atherosclerosis. Hyperinflated lungs. Chronically coarsened interstitial markings. No focal airspace consolidation, pleural effusion, or pneumothorax. IMPRESSION: Cardiomegaly and COPD. No acute cardiopulmonary findings. Electronically Signed   By: Davina Poke D.O.   On: 01/09/2023 17:07     Discharge Instructions: Discharge Instructions     Diet - low sodium heart healthy   Complete by: As directed    Discharge instructions   Complete by: As directed    1. Please call our clinic if you have worse shortness of breath, chest pain, or weakness, or come back to the ED to be re-evaluated if you feel that it is an emergency 2. For your warfarin, we discussed with Dr. Elie Confer and you will be taking a slightly different dose for the next few days. Please take 4 mg (one tablet) tomorrow on 2/28, 2 mg (1/2 tablet) the day after on 2/29 and continue to alternate '4mg'$  and '2mg'$  doses until you can see Dr. Elie Confer in clinic on Monday 3/4. The rest of your medicines are unchanged. 3.Please continue to isolate at home for 3 more days and wear a mask when you are around others. 4. You have an appointment with Dr. Daryll Drown in the Internal Medicine Clinic on 3/1 at 9:45 am.   Increase activity slowly   Complete  by: As directed        Discharge Instructions   None     Signed: Linus Galas, MD 01/11/2023, 7:37 PM   Pager: [phone]

## 2023-01-11 NOTE — Progress Notes (Signed)
Pharmacist rounding with the IMTS/[facility]. Patient readying for discharge today. Her indications for warfarin are:  Long term current use of anticoagulant therapy [Z79.01]; Peripheral arterial occlusive disease ([place]) [I77.9] ;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[place]) [T82.868A] . Since her last INR in the [facility], she has been taking 4 milligrams of warfarin by mouth once-daily. These instructions were used for her inpatient management as well. Her INR has stay sustained in target range of 2.0 - 3.0 (reviewed). Today's INR = 3.1. Have advised her Physician to decrease today's dose to '2mg'$  and then commencing tomorrow, alternate every-other-day with '4mg'$  warfarin until seen in the [facility] on Monday 4-MAR-24.

## 2023-01-12 ENCOUNTER — Telehealth: Payer: Self-pay

## 2023-01-12 NOTE — Transitions of Care (Post Inpatient/ED Visit) (Signed)
   01/12/2023  Name: Kristin Knight MRN: [id number] DOB: Oct 30, 1946  Today's TOC FU Call Status: Today's TOC FU Call Status:: Successful TOC FU Call Competed TOC FU Call Complete Date: 01/12/23  Transition Care Management Follow-up Telephone Call Date of Discharge: 01/11/23 Discharge Facility[facility]) Type of Discharge: Inpatient Admission Primary Inpatient Discharge Diagnosis:: fluid overloud How have you been since you were released from the hospital?: Better Any questions or concerns?: No  Items Reviewed: Did you receive and understand the discharge instructions provided?: Yes Medications obtained and verified?: Yes (Medications Reviewed) Any new allergies since your discharge?: No Dietary orders reviewed?: Yes Do you have support at home?: Yes People in Home: child(ren), adult  Home Care and Equipment/Supplies: [place] Ordered?: NA Any new equipment or medical supplies ordered?: NA  Functional Questionnaire: Do you need assistance with bathing/showering or dressing?: No Do you need assistance with meal preparation?: No Do you need assistance with eating?: No Do you have difficulty maintaining continence: No Do you need assistance with getting out of bed/getting out of a chair/moving?: No Do you have difficulty managing or taking your medications?: No  Folllow up appointments reviewed: PCP Follow-up appointment confirmed?: Yes Date of PCP follow-up appointment?: 01/14/23 Follow-up Provider: [facility] Follow-up appointment confirmed?: NA Do you need transportation to your follow-up appointment?: No Do you understand care options if your condition(s) worsen?: Yes-patient verbalized understanding    [place], [name] Direct Dial [phone]

## 2023-01-14 ENCOUNTER — Other Ambulatory Visit: Payer: Self-pay

## 2023-01-14 ENCOUNTER — Ambulatory Visit (INDEPENDENT_AMBULATORY_CARE_PROVIDER_SITE_OTHER): Payer: Medicare HMO | Admitting: Internal Medicine

## 2023-01-14 ENCOUNTER — Encounter: Payer: Self-pay | Admitting: Internal Medicine

## 2023-01-14 ENCOUNTER — Telehealth: Payer: Self-pay | Admitting: *Deleted

## 2023-01-14 VITALS — BP 110/68 | HR 61 | Temp 98.0°F | Ht 64.0 in | Wt 93.5 lb

## 2023-01-14 DIAGNOSIS — T82898S Other specified complication of vascular prosthetic devices, implants and grafts, sequela: Secondary | ICD-10-CM

## 2023-01-14 DIAGNOSIS — I5022 Chronic systolic (congestive) heart failure: Secondary | ICD-10-CM | POA: Diagnosis not present

## 2023-01-14 DIAGNOSIS — Z7901 Long term (current) use of anticoagulants: Secondary | ICD-10-CM | POA: Diagnosis not present

## 2023-01-14 DIAGNOSIS — T82898D Other specified complication of vascular prosthetic devices, implants and grafts, subsequent encounter: Secondary | ICD-10-CM | POA: Diagnosis not present

## 2023-01-14 LAB — CULTURE, BLOOD (ROUTINE X 2)
Culture: NO GROWTH
Culture: NO GROWTH
Special Requests: ADEQUATE
Special Requests: ADEQUATE

## 2023-01-14 LAB — PROTIME-INR
INR: 5.1 (ref 0.8–1.2)
Prothrombin Time: 46.7 seconds — ABNORMAL HIGH (ref 11.4–15.2)

## 2023-01-14 LAB — POCT INR: INR: 5.5 — AB (ref 2.0–3.0)

## 2023-01-14 NOTE — Telephone Encounter (Signed)
Day of discharge from the hospital (while sustained on '4mg'$  warfarin qd), her INR was 3.1. Her dose at discharge from the hospital on [id number], she was instructed to take '2mg'$  alternating with '4mg'$  warfarin every other day. INR today 5.1. The patient has been called and her result shared with her. She was instructed to OMIT warfarin dose for today, Friday, 1-MAR-24 and tomorrow, Saturday 2-MAR-24. Sunday, 3-MAR-24, take one (1) of her '4mg'$  strength blue-colored warfarin tablets by mouth. Monday, 4-MAR-24, come to the clinic for repeat fingerstick point of care INR determination. Target range for this patient is 2.0 - 3.0. I reviewed her last albumin--3.9. Reviewed her LFTs--WNL. No new medications prescribed at discharge per the AVS provided at discharge. No antibiotics, etc. Patient dose not report/endorse any signs/symptoms of bleeding at present time. She was advised if they were to occur, to report to the ED. She was provided by cell phone number to call if she or her family had any questions.

## 2023-01-14 NOTE — Telephone Encounter (Signed)
Received.

## 2023-01-14 NOTE — Telephone Encounter (Signed)
Call from [place] from the main lab - pt's INR is 5.1.

## 2023-01-14 NOTE — Assessment & Plan Note (Signed)
Currently doing well, no crackles, no LE edema.  She feels well.   Plan Continue with medications, would not restart jardiance.

## 2023-01-14 NOTE — Assessment & Plan Note (Signed)
She is doing well, no pain.  She is on coumadin.  INR will be checked today to evaluate if she needs modification of her regimen.  Dr. Elie Confer will be alerted to her level.

## 2023-01-14 NOTE — Progress Notes (Signed)
Established Patient Office Visit  Subjective   Patient ID: Kristin Knight, female    DOB: [birth year]  Age: 77 y.o. MRN: [id number]  Chief Complaint  Patient presents with   HFU Visit    Kristin Knight reports doing well since being discharged.  She has no more SOB.  She does continue to have a cough.  She has no swelling and no fatigue.  She is living with her daughter and would like to go back home when she is able.  Saturation was 100% today and blood pressure was at goal.  She has stayed off her jardiance since last visit.  Her weight has come down about 5 pounds since last outpatient visit.  This could be due to illness.  We will continue to monitor her weight closely.     Review of Systems  Constitutional:  Negative for chills, fever and malaise/fatigue.  Respiratory:  Positive for cough. Negative for hemoptysis, sputum production, shortness of breath and wheezing.   Cardiovascular:  Negative for chest pain and leg swelling.  Musculoskeletal:  Negative for myalgias.      Objective:     BP 110/68 (BP Location: Right Arm, Patient Position: Sitting, Cuff Size: Small)   Pulse 61   Temp 98 F (36.7 C) (Oral)   Ht '5\' 4"'$  (1.626 m)   Wt 93 lb 8 oz (42.4 kg)   LMP 01/13/1974   SpO2 100% Comment: RA  BMI 16.05 kg/m  BP Readings from Last 3 Encounters:  01/14/23 110/68  01/11/23 135/81  12/31/22 (!) 147/92   Wt Readings from Last 3 Encounters:  01/14/23 93 lb 8 oz (42.4 kg)  01/11/23 88 lb 2.9 oz (40 kg)  12/31/22 101 lb 1.6 oz (45.9 kg)      Physical Exam Vitals and nursing note reviewed.  Constitutional:      General: She is not in acute distress.    Appearance: She is not toxic-appearing.     Comments: Low weight  HENT:     Head: Normocephalic and atraumatic.  Cardiovascular:     Rate and Rhythm: Normal rate and regular rhythm.     Heart sounds: No murmur heard. Pulmonary:     Effort: Pulmonary effort is normal. No respiratory distress.     Breath sounds:  Normal breath sounds. No wheezing or rales.  Musculoskeletal:        General: No swelling or tenderness.     Right lower leg: No edema.     Left lower leg: No edema.  Skin:    General: Skin is warm and dry.  Neurological:     General: No focal deficit present.     Mental Status: She is alert. Mental status is at baseline.  Psychiatric:        Mood and Affect: Mood normal.        Behavior: Behavior normal.      No results found for any visits on 01/14/23.   The 10-year ASCVD risk score (Arnett DK, et al., 2019) is: 9.9%    Assessment & Plan:   Problem List Items Addressed This Visit       Unprioritized   Occlusion of femoropopliteal bypass graft ([place]) - Primary    She is doing well, no pain.  She is on coumadin.  INR will be checked today to evaluate if she needs modification of her regimen.  Dr. Elie Confer will be alerted to her level.       Relevant Orders   POCT  INR   Chronic systolic heart failure (HCC)    Currently doing well, no crackles, no LE edema.  She feels well.   Plan Continue with medications, would not restart jardiance.      Weight down 5 pounds, we will recheck in 6 months at next appointment.   Return in about 3 months (around 04/16/2023).    Gilles Chiquito, MD

## 2023-01-14 NOTE — Patient Instructions (Signed)
Kristin Knight --   You are doing well.   Please get your INR checked today for your coumadin dosing.   I will see you back in 3 months.

## 2023-01-17 ENCOUNTER — Ambulatory Visit: Payer: Medicare HMO | Admitting: Pharmacist

## 2023-01-17 DIAGNOSIS — I779 Disorder of arteries and arterioles, unspecified: Secondary | ICD-10-CM

## 2023-01-17 DIAGNOSIS — Z7901 Long term (current) use of anticoagulants: Secondary | ICD-10-CM

## 2023-01-17 DIAGNOSIS — T82868S Thrombosis of vascular prosthetic devices, implants and grafts, sequela: Secondary | ICD-10-CM

## 2023-01-17 LAB — POCT INR: INR: 1.2 — AB (ref 2.0–3.0)
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
## 2023-01-17 NOTE — Patient Instructions (Signed)
Patient instructed to take medications as defined in the Anti-coagulation Track section of this encounter.  Patient instructed to take today's dose.  Patient instructed to take one (1) tablet of your 4 milligram strength, blue warfarin tablets at 6:00 pm each day--EXCEPT on [facility] and FRIDAYS, take ONLY one-half (1/2) tablet on these days.  Patient verbalized understanding of these instructions.

## 2023-01-24 ENCOUNTER — Ambulatory Visit (INDEPENDENT_AMBULATORY_CARE_PROVIDER_SITE_OTHER): Payer: Medicare HMO | Admitting: Pharmacist

## 2023-01-24 DIAGNOSIS — I779 Disorder of arteries and arterioles, unspecified: Secondary | ICD-10-CM | POA: Diagnosis not present

## 2023-01-24 DIAGNOSIS — T82868D Thrombosis of vascular prosthetic devices, implants and grafts, subsequent encounter: Secondary | ICD-10-CM

## 2023-01-24 DIAGNOSIS — Z7901 Long term (current) use of anticoagulants: Secondary | ICD-10-CM | POA: Diagnosis not present

## 2023-01-24 DIAGNOSIS — T82868S Thrombosis of vascular prosthetic devices, implants and grafts, sequela: Secondary | ICD-10-CM

## 2023-01-24 LAB — POCT INR: INR: 3.2 — AB (ref 2.0–3.0)

## 2023-01-24 NOTE — Patient Instructions (Signed)
Patient instructed to take medications as defined in the Anti-coagulation Track section of this encounter.  Patient instructed to take today's dose.  Patient instructed to take one (1) tablet of your 4 milligram strength, blue warfarin tablets at 6:00 pm each day--EXCEPT on MONDAYS, WEDNESDAYS and FRIDAYS, take ONLY one-half (1/2) tablet on these days.   Patient verbalized understanding of these instructions.
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
## 2023-02-02 ENCOUNTER — Other Ambulatory Visit: Payer: Self-pay

## 2023-02-02 DIAGNOSIS — M545 Low back pain, unspecified: Secondary | ICD-10-CM

## 2023-02-02 MED ORDER — OXYCODONE-ACETAMINOPHEN 7.5-325 MG PO TABS: 1.0000 | ORAL_TABLET | Freq: Three times a day (TID) | ORAL | 0 refills | Status: DC | PRN

## 2023-02-02 NOTE — Telephone Encounter (Signed)
Last rx written-12/17/22. Last OV-01/14/23. Next OV-05/03/23 with PCP. TOX-10/22/22.

## 2023-02-07 ENCOUNTER — Ambulatory Visit (INDEPENDENT_AMBULATORY_CARE_PROVIDER_SITE_OTHER): Payer: Medicare HMO | Admitting: Pharmacist

## 2023-02-07 DIAGNOSIS — Z7901 Long term (current) use of anticoagulants: Secondary | ICD-10-CM | POA: Diagnosis not present

## 2023-02-07 DIAGNOSIS — T82868D Thrombosis of vascular prosthetic devices, implants and grafts, subsequent encounter: Secondary | ICD-10-CM

## 2023-02-07 DIAGNOSIS — I779 Disorder of arteries and arterioles, unspecified: Secondary | ICD-10-CM

## 2023-02-07 DIAGNOSIS — T82868S Thrombosis of vascular prosthetic devices, implants and grafts, sequela: Secondary | ICD-10-CM

## 2023-02-07 LAB — POCT INR: INR: 2 (ref 2.0–3.0)
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
## 2023-02-07 NOTE — Patient Instructions (Signed)
Patient instructed to take medications as defined in the Anti-coagulation Track section of this encounter.  Patient instructed to take today's dose.  Patient instructed to take one (1) tablet of your 4 milligram strength, blue warfarin tablets at 6:00 pm each day--EXCEPT on FRIDAYS and SUNDAYS,take ONLY one-half (1/2) tablet on these days. Patient verbalized understanding of these instructions.

## 2023-02-14 ENCOUNTER — Ambulatory Visit (HOSPITAL_COMMUNITY)
Admission: RE | Admit: 2023-02-14 | Discharge: 2023-02-14 | Disposition: A | Discharge status: Home / Self Care | Payer: Medicare HMO | Source: Ambulatory Visit | Attending: Internal Medicine | Admitting: Internal Medicine

## 2023-02-14 DIAGNOSIS — Z87891 Personal history of nicotine dependence: Secondary | ICD-10-CM | POA: Insufficient documentation

## 2023-02-28 ENCOUNTER — Ambulatory Visit (INDEPENDENT_AMBULATORY_CARE_PROVIDER_SITE_OTHER): Payer: Medicare HMO | Admitting: Pharmacist

## 2023-02-28 DIAGNOSIS — I779 Disorder of arteries and arterioles, unspecified: Secondary | ICD-10-CM | POA: Diagnosis not present

## 2023-02-28 DIAGNOSIS — T82868D Thrombosis of vascular prosthetic devices, implants and grafts, subsequent encounter: Secondary | ICD-10-CM

## 2023-02-28 DIAGNOSIS — Z7901 Long term (current) use of anticoagulants: Secondary | ICD-10-CM | POA: Diagnosis not present

## 2023-02-28 DIAGNOSIS — T82868S Thrombosis of vascular prosthetic devices, implants and grafts, sequela: Secondary | ICD-10-CM

## 2023-02-28 LAB — POCT INR: INR: 2.8 (ref 2.0–3.0)

## 2023-02-28 NOTE — Patient Instructions (Signed)
Patient instructed to take medications as defined in the Anti-coagulation Track section of this encounter.  Patient instructed to take today's dose.  Patient instructed to take one (1) tablet of your 4 milligram strength, blue warfarin tablets at 6:00 pm each day--EXCEPT on FRIDAYS and SUNDAYS,take ONLY one-half (1/2) tablet on these days. Patient verbalized understanding of these instructions.  
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
## 2023-02-28 NOTE — Progress Notes (Signed)
INTERNAL MEDICINE TEACHING ATTENDING ADDENDUM   I agree with these recommendations regarding anticoagulation management.   Worthington Cruzan, MD  

## 2023-03-10 ENCOUNTER — Other Ambulatory Visit: Payer: Self-pay | Admitting: Internal Medicine

## 2023-03-10 DIAGNOSIS — M545 Low back pain, unspecified: Secondary | ICD-10-CM

## 2023-03-10 NOTE — Telephone Encounter (Signed)
MED REFILL REQUEST ? ?oxyCODONE-acetaminophen (PERCOCET) 7.5-325 MG tablet ? ?[id number] - [place] ([place]), [place] - [address] Phone:  [phone]  ?Fax:  [phone]  ?  ? ?

## 2023-03-10 NOTE — Telephone Encounter (Signed)
Last rx written - 02/02/23. Last OV - 01/14/23. Next OV - 05/03/23. TOX - 10/22/22.

## 2023-03-11 MED ORDER — OXYCODONE-ACETAMINOPHEN 7.5-325 MG PO TABS: 1.0000 | ORAL_TABLET | Freq: Three times a day (TID) | ORAL | 0 refills | Status: DC | PRN

## 2023-03-15 ENCOUNTER — Other Ambulatory Visit: Payer: Self-pay | Admitting: Internal Medicine

## 2023-03-18 ENCOUNTER — Other Ambulatory Visit: Payer: Self-pay | Admitting: Internal Medicine

## 2023-03-24 ENCOUNTER — Telehealth: Payer: Self-pay | Admitting: Internal Medicine

## 2023-03-24 NOTE — Telephone Encounter (Signed)
Contacted Kristin Knight to schedule their annual wellness visit. Appointment made for 04/06/2023.  [facility] Care Guide [facility] AWV TEAM Direct Dial: [phone]

## 2023-03-28 ENCOUNTER — Ambulatory Visit: Payer: Medicare HMO

## 2023-03-28 ENCOUNTER — Other Ambulatory Visit: Payer: Self-pay | Admitting: Pharmacist

## 2023-04-07 ENCOUNTER — Other Ambulatory Visit: Payer: Self-pay

## 2023-04-07 DIAGNOSIS — G8929 Other chronic pain: Secondary | ICD-10-CM

## 2023-04-07 MED ORDER — OXYCODONE-ACETAMINOPHEN 7.5-325 MG PO TABS: 1.0000 | ORAL_TABLET | Freq: Three times a day (TID) | ORAL | 0 refills | Status: DC | PRN

## 2023-04-25 ENCOUNTER — Ambulatory Visit (INDEPENDENT_AMBULATORY_CARE_PROVIDER_SITE_OTHER): Payer: Medicare HMO | Admitting: Pharmacist

## 2023-04-25 DIAGNOSIS — T82868D Thrombosis of vascular prosthetic devices, implants and grafts, subsequent encounter: Secondary | ICD-10-CM

## 2023-04-25 DIAGNOSIS — I779 Disorder of arteries and arterioles, unspecified: Secondary | ICD-10-CM

## 2023-04-25 DIAGNOSIS — Z7901 Long term (current) use of anticoagulants: Secondary | ICD-10-CM

## 2023-04-25 DIAGNOSIS — T82868S Thrombosis of vascular prosthetic devices, implants and grafts, sequela: Secondary | ICD-10-CM

## 2023-04-25 LAB — POCT INR: INR: 3.3 — AB (ref 2.0–3.0)

## 2023-04-25 NOTE — Patient Instructions (Signed)
Patient instructed to take medications as defined in the Anti-coagulation Track section of this encounter.  Patient instructed to take today's dose.  Patient instructed to take one (1) tablet of your 4 milligram strength, blue warfarin tablets at 6:00 pm each day--EXCEPT on Mondays, Wednesdays and Friddays,take ONLY one-half (1/2) tablet on these days.   Patient verbalized understanding of these instructions.
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
## 2023-05-03 ENCOUNTER — Other Ambulatory Visit: Payer: Self-pay

## 2023-05-03 ENCOUNTER — Encounter: Payer: Medicare HMO | Admitting: Internal Medicine

## 2023-05-03 DIAGNOSIS — M545 Low back pain, unspecified: Secondary | ICD-10-CM

## 2023-05-05 MED ORDER — OXYCODONE-ACETAMINOPHEN 7.5-325 MG PO TABS: 1.0000 | ORAL_TABLET | Freq: Three times a day (TID) | ORAL | 0 refills | Status: DC | PRN

## 2023-05-06 ENCOUNTER — Encounter: Payer: Medicare HMO | Admitting: Internal Medicine

## 2023-05-13 ENCOUNTER — Ambulatory Visit (INDEPENDENT_AMBULATORY_CARE_PROVIDER_SITE_OTHER): Payer: Medicare HMO | Admitting: Internal Medicine

## 2023-05-13 ENCOUNTER — Encounter: Payer: Self-pay | Admitting: Internal Medicine

## 2023-05-13 ENCOUNTER — Other Ambulatory Visit: Payer: Self-pay

## 2023-05-13 VITALS — BP 170/91 | HR 56 | Temp 97.9°F | Ht 64.0 in | Wt 107.1 lb

## 2023-05-13 DIAGNOSIS — I110 Hypertensive heart disease with heart failure: Secondary | ICD-10-CM

## 2023-05-13 DIAGNOSIS — D734 Cyst of spleen: Secondary | ICD-10-CM | POA: Diagnosis not present

## 2023-05-13 DIAGNOSIS — I743 Embolism and thrombosis of arteries of the lower extremities: Secondary | ICD-10-CM

## 2023-05-13 DIAGNOSIS — I5022 Chronic systolic (congestive) heart failure: Secondary | ICD-10-CM

## 2023-05-13 DIAGNOSIS — Z87891 Personal history of nicotine dependence: Secondary | ICD-10-CM

## 2023-05-13 DIAGNOSIS — I10 Essential (primary) hypertension: Secondary | ICD-10-CM

## 2023-05-13 DIAGNOSIS — N200 Calculus of kidney: Secondary | ICD-10-CM | POA: Insufficient documentation

## 2023-05-13 DIAGNOSIS — Z7901 Long term (current) use of anticoagulants: Secondary | ICD-10-CM

## 2023-05-13 DIAGNOSIS — F331 Major depressive disorder, recurrent, moderate: Secondary | ICD-10-CM

## 2023-05-13 MED ORDER — METOPROLOL SUCCINATE ER 25 MG PO TB24: 25.0000 mg | ORAL_TABLET | Freq: Every day | ORAL | 3 refills | Status: AC

## 2023-05-13 MED ORDER — ENTRESTO 97-103 MG PO TABS: 1.0000 | ORAL_TABLET | Freq: Two times a day (BID) | ORAL | 3 refills | Status: DC

## 2023-05-13 MED ORDER — SPIRONOLACTONE 25 MG PO TABS: 25.0000 mg | ORAL_TABLET | Freq: Every day | ORAL | 3 refills | Status: DC

## 2023-05-13 MED ORDER — FUROSEMIDE 40 MG PO TABS: 40.0000 mg | ORAL_TABLET | Freq: Every day | ORAL | 11 refills | Status: DC

## 2023-05-13 NOTE — Assessment & Plan Note (Signed)
On physical exam today euvolemic. Her BP is elevated today 157/98. She denies orthopnea, PND, chest pain, abdominal pain, syncope, abdominal swelling. She endorses some SOB with exertion like going up stairs. She uses 2 pillows to sleep, but states that this is for comfort, and that she does not feel SOB lying down. She felt dizzy last week while sitting and doing a puzzle.   Medication regimen clarified and cross referenced with EPIC, it is unclear if she is taking Entresto and Lasix. Will send all of her medications for heart failure to the pharmacy and include instructions on AVS for medication regimen. Will schedule 2 week follow up for BP check and to also review medication regimen.

## 2023-05-13 NOTE — Assessment & Plan Note (Signed)
Incidental renal stone finding on lung CT. She denies flank pain, suprapubic pain, fever, chills, dysuria, hematuria. She does not have CVA tenderness on exam today.   Will continue to monitor.

## 2023-05-13 NOTE — Assessment & Plan Note (Signed)
She is taking zoloft as prescribed and is tolerating this medication. She feels that she has had improved mood on it.   Will continue to monitor.

## 2023-05-13 NOTE — Assessment & Plan Note (Signed)
She is taking warfarin as prescribed and is tolerating it well.   She is to continue on this medication and will continue to monitor.

## 2023-05-13 NOTE — Progress Notes (Unsigned)
This is a Psychologist, occupational Note.  The care of the patient was discussed with Dr. Criselda Peaches and the assessment and plan was formulated with their assistance.  Please see their note for official documentation of the patient encounter.   Subjective:   Patient ID: Kristin Knight female   DOB: 09-23-1946 77 y.o.   MRN: [id number]  HPI: Kristin Knight is a 77 y.o. person with a PMH of HTN, systolic heart failure, COPD, and MDD who presents today for a follow up. Her weight is up 14 # since her last visit in March. Patient reports incidences of peripheral edema on a daily basis at the end of the day after being on her feet. 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 She has worked there for 4  years. She does not report any changes in diet or levels of exercise, but does not frequently cook and has been eating out recently. She has noted no changes to stress levels recently. She lives with her older son and is also able to see her daughter frequently who lives nearby.   Patient does not have her medications with her today, but relays that she is taking fosamax once weekly, warfarin, zoloft, percocet, lasix, entresto, and atrovastatin as prescribed.   For the details of today's visit, please refer to the assessment and plan.    Current Outpatient Medications  Medication Sig Dispense Refill   albuterol (VENTOLIN HFA) 108 (90 Base) MCG/ACT inhaler Inhale 2 puffs into the lungs every 6 (six) hours as needed for wheezing or shortness of breath.     alendronate (FOSAMAX) 70 MG tablet Take 1 tablet (70 mg total) by mouth every 7 (seven) days. Take with a full glass of water on an empty stomach. 12 tablet 3   atorvastatin (LIPITOR) 40 MG tablet Take 1 tablet by mouth once daily 90 tablet 3   furosemide (LASIX) 40 MG tablet Take 1 tablet (40 mg total) by mouth daily. 30 tablet 11   metoprolol succinate (TOPROL-XL) 25 MG 24 hr  tablet Take 1 tablet (25 mg total) by mouth daily. 90 tablet 3   oxyCODONE-acetaminophen (PERCOCET) 7.5-325 MG tablet Take 1 tablet by mouth every 8 (eight) hours as needed for severe pain. 60 tablet 0   sacubitril-valsartan (ENTRESTO) 97-103 MG Take 1 tablet by mouth 2 (two) times daily. 180 tablet 3   sertraline (ZOLOFT) 50 MG tablet Take 1 tablet (50 mg total) by mouth daily. 90 tablet 3   spironolactone (ALDACTONE) 25 MG tablet Take 1 tablet (25 mg total) by mouth daily. 90 tablet 3   warfarin (COUMADIN) 4 MG tablet TAKE 1 TABLET BY MOUTH ONCE DAILY AT  4PM 30 tablet 0   No current facility-administered medications for this visit.    Review of Systems: Pertinent items are noted in HPI. Objective:  Physical Exam: Vitals:   05/13/23 0901 05/13/23 0952  BP: (!) 157/98 (!) 170/91  Pulse: (!) 56 (!) 56  Temp: 97.9 F (36.6 C)   TempSrc: Oral   SpO2: 100%   Weight: 107 lb 1.6 oz (48.6 kg)   Height: 5\' 4"  (1.626 m)    Constitutional: NAD, appears comfortable. Well dressed.  Cardiovascular: RRR, I did not auscultate murmurs over the aortic or pulmonic posts.  Pulmonary/Chest: CTAB, I heard normal breath sounds.  Extremities: No edema.  Abdomen: No CVA tenderness Psychiatric: Normal  mood and affect  Assessment & Plan:   Major depressive disorder, recurrent, moderate (HCC) She is taking zoloft as prescribed and is tolerating this medication. She feels that she has had improved mood on it.   Will continue to monitor.   Chronic systolic heart failure (HCC) On physical exam today euvolemic. Her BP is elevated today 157/98. She denies orthopnea, PND, chest pain, abdominal pain, syncope, abdominal swelling. She endorses some SOB with exertion like going up stairs. She uses 2 pillows to sleep, but states that this is for comfort, and that she does not feel SOB lying down. She felt dizzy last week while sitting and doing a puzzle.   Medication regimen clarified and cross referenced with  EPIC, it is unclear if she is taking Entresto and Lasix. Will send all of her medications for heart failure to the pharmacy and include instructions on AVS for medication regimen. Will schedule 2 week follow up for BP check and to also review medication regimen.   Renal stone Incidental renal stone finding on lung CT. She denies flank pain, suprapubic pain, fever, chills, dysuria, hematuria. She does not have CVA tenderness on exam today.   Will continue to monitor.   Cyst of spleen Incidental finding on lung CT.   Will order MRI of abdomen to further assess.   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   She is to continue on this medication and will continue to monitor.   Essential hypertension Blood pressure today is elevated 157/98 and 170/91 on repeat. Medication reconciliation was done and epic cross referenced. Medication regimen should likely be clarified with bringing mediations in person due to discrepancies.    Refills of all of her medications have been sent to her pharmacy and instructions have been attached to her AVS. Will advise to follow up in 2 weeks to recheck bp and complete medication reconciliation.

## 2023-05-13 NOTE — Assessment & Plan Note (Signed)
Incidental finding on lung CT.   Will order MRI of abdomen to further assess.

## 2023-05-13 NOTE — Progress Notes (Deleted)
   Established Patient Office Visit  Subjective   Patient ID: Kristin Knight, female    DOB: 1946-09-01  Age: 77 y.o. MRN: [id number]  Chief Complaint  Patient presents with   Follow-up    HPI  {History (Optional):23778}  ROS    Objective:     LMP 01/13/1974  {Vitals History (Optional):23777}  Physical Exam   No results found for any visits on 05/13/23.  {Labs (Optional):23779}  The 10-year ASCVD risk score (Arnett DK, et al., 2019) is: 10.6%    Assessment & Plan:   Problem List Items Addressed This Visit   None   No follow-ups on file.    Debe Coder, MD

## 2023-05-13 NOTE — Assessment & Plan Note (Addendum)
Blood pressure today is elevated 157/98 and 170/91 on repeat. Medication reconciliation was done and epic cross referenced. Medication regimen should likely be clarified with bringing mediations in person due to discrepancies.    Refills of all of her medications have been sent to her pharmacy and instructions have been attached to her AVS. Will advise to follow up in 2 weeks to recheck bp and complete medication reconciliation.

## 2023-05-13 NOTE — Patient Instructions (Addendum)
Hi [name],   It was wonderful speaking with you today! I will be ordering an MRI of your spleen to better visualize the cyst found on it. I would also like to see you in 2 weeks to recheck your blood pressure and also see what medications you are taking. Please bring all of your medications to this visit. I have resent all of your medications to the pharmacy.   Here are all of the medications you are prescribed and how you should be taking them:  Fosamax: once a week Entresto: 2 times a day Lasix: 1 time a day Atorvastatin: 1 time a day Zoloft: 1 time a day Warfarin: 1 time a day Metoprolol: 1 time a day Spironolactone: 1 time a day Percocet: As needed for pain every 8 hours  Please compare this list to your medicines at home.

## 2023-05-15 LAB — CBC
Hematocrit: 40.7 % (ref 34.0–46.6)
Hemoglobin: 13.1 g/dL (ref 11.1–15.9)
MCH: 29 pg (ref 26.6–33.0)
MCHC: 32.2 g/dL (ref 31.5–35.7)
MCV: 90 fL (ref 79–97)
Platelets: 198 10*3/uL (ref 150–450)
RBC: 4.52 x10E6/uL (ref 3.77–5.28)
RDW: 12.9 % (ref 11.7–15.4)
WBC: 5.7 10*3/uL (ref 3.4–10.8)

## 2023-05-15 LAB — CMP14 + ANION GAP
ALT: 14 IU/L (ref 0–32)
AST: 17 IU/L (ref 0–40)
Albumin: 4.6 g/dL (ref 3.8–4.8)
Alkaline Phosphatase: 74 IU/L (ref 44–121)
Anion Gap: 14 mmol/L (ref 10.0–18.0)
BUN/Creatinine Ratio: 11 — ABNORMAL LOW (ref 12–28)
BUN: 10 mg/dL (ref 8–27)
Bilirubin Total: 0.4 mg/dL (ref 0.0–1.2)
CO2: 27 mmol/L (ref 20–29)
Calcium: 9.7 mg/dL (ref 8.7–10.3)
Chloride: 101 mmol/L (ref 96–106)
Creatinine, Ser: 0.94 mg/dL (ref 0.57–1.00)
Globulin, Total: 2.8 g/dL (ref 1.5–4.5)
Glucose: 100 mg/dL — ABNORMAL HIGH (ref 70–99)
Potassium: 4.1 mmol/L (ref 3.5–5.2)
Sodium: 142 mmol/L (ref 134–144)
Total Protein: 7.4 g/dL (ref 6.0–8.5)
eGFR: 63 mL/min/{1.73_m2} (ref >59)

## 2023-05-17 ENCOUNTER — Encounter: Payer: Self-pay | Admitting: Internal Medicine

## 2023-05-17 NOTE — Progress Notes (Signed)
Attestation for Student Documentation:  I personally was present and re-performed the history, physical exam and medical decision-making activities of this service and have verified that the service and findings are accurately documented in the student's note.  Ms Kitzinger has been having increased weight.  This is partially planned given we have stopped her jardiance in the last few months due to weight loss.  However, we made sure that she appeared euvolemic on exam, which she did.  We also discussed her medications with her and from review of refill history, it is not clear that she is taking her entresto or lasix, though she does report taking them.  She is not sure about her metoprolol, but refill history is good.  Overall, she needs a repeat blood pressure visit and a medication review, so we will see her back in 2 weeks for this to avoid a CHF exacerbation and possible hospitalization.    We listed her medications for her to check at home what she has and what she needs to start taking.  Refills were sent to the pharmacy.   [name], MD 05/17/2023, 3:42 PM

## 2023-05-20 ENCOUNTER — Other Ambulatory Visit: Payer: Self-pay | Admitting: Internal Medicine

## 2023-05-20 NOTE — Telephone Encounter (Signed)
Next coumadin clinic appt is 05/30/23.

## 2023-05-30 ENCOUNTER — Ambulatory Visit (INDEPENDENT_AMBULATORY_CARE_PROVIDER_SITE_OTHER): Payer: Medicare HMO | Admitting: Pharmacist

## 2023-05-30 DIAGNOSIS — Z7901 Long term (current) use of anticoagulants: Secondary | ICD-10-CM | POA: Diagnosis not present

## 2023-05-30 DIAGNOSIS — T82868D Thrombosis of vascular prosthetic devices, implants and grafts, subsequent encounter: Secondary | ICD-10-CM | POA: Diagnosis not present

## 2023-05-30 DIAGNOSIS — I779 Disorder of arteries and arterioles, unspecified: Secondary | ICD-10-CM | POA: Diagnosis not present

## 2023-05-30 DIAGNOSIS — T82868S Thrombosis of vascular prosthetic devices, implants and grafts, sequela: Secondary | ICD-10-CM

## 2023-05-30 LAB — POCT INR: INR: 3.5 — AB (ref 2.0–3.0)

## 2023-05-30 NOTE — Patient Instructions (Signed)
Patient instructed to take medications as defined in the Anti-coagulation Track section of this encounter.  Patient instructed to take today's dose.  Patient instructed to take one-half (1/2) of your 4 milligram strength, blue-colored warfarin tablet Mondays, Tuesdays, Wednesdays, Thursdays and Fridays. On Saturdays and Sundays, take one (1) tablet.  Patient verbalized understanding of these instructions.
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
## 2023-06-03 ENCOUNTER — Encounter: Payer: Self-pay | Admitting: Internal Medicine

## 2023-06-03 ENCOUNTER — Ambulatory Visit (INDEPENDENT_AMBULATORY_CARE_PROVIDER_SITE_OTHER): Payer: Medicare HMO | Admitting: Internal Medicine

## 2023-06-03 ENCOUNTER — Ambulatory Visit
Admission: RE | Admit: 2023-06-03 | Discharge: 2023-06-03 | Disposition: A | Discharge status: Home / Self Care | Payer: Medicare HMO | Source: Ambulatory Visit | Attending: Internal Medicine | Admitting: Internal Medicine

## 2023-06-03 VITALS — BP 124/86 | Temp 97.9°F | Ht 64.0 in | Wt 106.7 lb

## 2023-06-03 DIAGNOSIS — I110 Hypertensive heart disease with heart failure: Secondary | ICD-10-CM | POA: Diagnosis not present

## 2023-06-03 DIAGNOSIS — F331 Major depressive disorder, recurrent, moderate: Secondary | ICD-10-CM

## 2023-06-03 DIAGNOSIS — I10 Essential (primary) hypertension: Secondary | ICD-10-CM

## 2023-06-03 DIAGNOSIS — M545 Low back pain, unspecified: Secondary | ICD-10-CM

## 2023-06-03 DIAGNOSIS — M810 Age-related osteoporosis without current pathological fracture: Secondary | ICD-10-CM | POA: Diagnosis not present

## 2023-06-03 DIAGNOSIS — I5022 Chronic systolic (congestive) heart failure: Secondary | ICD-10-CM | POA: Diagnosis not present

## 2023-06-03 DIAGNOSIS — Z1231 Encounter for screening mammogram for malignant neoplasm of breast: Secondary | ICD-10-CM

## 2023-06-03 DIAGNOSIS — I743 Embolism and thrombosis of arteries of the lower extremities: Secondary | ICD-10-CM | POA: Diagnosis not present

## 2023-06-03 DIAGNOSIS — Z87891 Personal history of nicotine dependence: Secondary | ICD-10-CM

## 2023-06-03 DIAGNOSIS — G8929 Other chronic pain: Secondary | ICD-10-CM

## 2023-06-03 MED ORDER — OXYCODONE-ACETAMINOPHEN 7.5-325 MG PO TABS: 1.0000 | ORAL_TABLET | Freq: Three times a day (TID) | ORAL | 0 refills | Status: DC | PRN

## 2023-06-03 NOTE — Progress Notes (Signed)
Attestation for Student Documentation:  I personally was present and re-performed the history, physical exam and medical decision-making activities of this service and have verified that the service and findings are accurately documented in the student's note.  Kristin Knight is doing well. She brought in all of her medications which was hugely helpful in deciding how to change her medications.  Today, she is euvolemic.  She has some vertigo when lying down.  Her weight is stable.  We reviewed her medications.  She will come back in 4 months and we will discuss vaccinations at that time.  Refill pain medication sent today.  PDMP reviewed.   [name], MD 06/03/2023, 1:54 PM

## 2023-06-03 NOTE — Assessment & Plan Note (Signed)
Currently takes percocet BID for chronic back pain and reports this sufficiently alleviates her pain.   Will continue this medication regimen and follow up in 4 months.

## 2023-06-03 NOTE — Assessment & Plan Note (Signed)
She is taking entresto, spironolactone, metoprolol, and lasix as prescribed and tolerates these medications. She is euvolemic on exam today. She denies SOB, PND, orthopnea, abdominal distention, or noticing peripheral edema. She has chest pain that she describes as minor, occurring months apart, and associated with indigestion and relieved with subsequent burping. She has dizziness upon lying down and turning her head while lying down, the last occasion of this which was last week.   Will encourage to continue with medications, to bring them at next visit, and to follow up in 4 months.

## 2023-06-03 NOTE — Assessment & Plan Note (Signed)
She is taking warfarin and is tolerating this medication well.   Will encourage to continue this medication and follow up in 4 months.

## 2023-06-03 NOTE — Assessment & Plan Note (Signed)
She is well dressed and appears in good spirits today. She has been taking zoloft as prescribed and tolerates this medication.   Will continue to encourage this medication regimen and monitor at follow up in 4 months.

## 2023-06-03 NOTE — Progress Notes (Signed)
This is a Psychologist, occupational Note.  The care of the patient was discussed with Dr. Criselda Peaches and the assessment and plan was formulated with their assistance.  Please see their note for official documentation of the patient encounter.   Subjective:   Patient ID: Kristin Knight female   DOB: 04-Nov-1946 77 y.o.   MRN: [id number]  HPI: Kristin Knight is a 77 y.o. woman with a PMH of HTN, chronic systolic heart failure, osteoarthritis of hand, osteoporosis, major depressive disorder who presents today for a follow up. She feels well today and says that today is her day off from her job working at [employer]. She lives close to her daughter and see her frequently. She is taking all of her medications and is tolerating them well. Specifically, she is taking alendronate, atorvastatin, furosemide, metoprolol, percocet, entresto, zoloft, spironolactone, and warfarin as prescribed.  She denies SOB, PND, orthopnea, abdominal distention, or noticing peripheral edema. She has chest pain that she describes as minor, occurring months apart, and associated with indigestion and relieved with subsequent burping. She has dizziness upon lying down and turning her head while lying down, the last occasion of this which was last week.   For the details of today's visit, please refer to the assessment and plan.    Current Outpatient Medications  Medication Sig Dispense Refill   albuterol (VENTOLIN HFA) 108 (90 Base) MCG/ACT inhaler Inhale 2 puffs into the lungs every 6 (six) hours as needed for wheezing or shortness of breath.     alendronate (FOSAMAX) 70 MG tablet Take 1 tablet (70 mg total) by mouth every 7 (seven) days. Take with a full glass of water on an empty stomach. 12 tablet 3   atorvastatin (LIPITOR) 40 MG tablet Take 1 tablet by mouth once daily 90 tablet 3   furosemide (LASIX) 40 MG tablet Take 1 tablet (40 mg total) by mouth daily. 30 tablet 11   metoprolol succinate (TOPROL-XL) 25 MG 24 hr tablet Take  1 tablet (25 mg total) by mouth daily. 90 tablet 3   oxyCODONE-acetaminophen (PERCOCET) 7.5-325 MG tablet Take 1 tablet by mouth every 8 (eight) hours as needed for severe pain. 60 tablet 0   sacubitril-valsartan (ENTRESTO) 97-103 MG Take 1 tablet by mouth 2 (two) times daily. 180 tablet 3   sertraline (ZOLOFT) 50 MG tablet Take 1 tablet (50 mg total) by mouth daily. 90 tablet 3   spironolactone (ALDACTONE) 25 MG tablet Take 1 tablet (25 mg total) by mouth daily. 90 tablet 3   warfarin (COUMADIN) 4 MG tablet TAKE 1 TABLET BY MOUTH ONCE DAILY AT  4PM 30 tablet 0   No current facility-administered medications for this visit.    Review of Systems: Pertinent items are noted in HPI. Objective:  Physical Exam: Vitals:   06/03/23 0819  BP: 124/86  Temp: 97.9 F (36.6 C)  TempSrc: Oral  SpO2: 99%  Weight: 106 lb 11.2 oz (48.4 kg)  Height: 5\' 4"  (1.626 m)   Constitutional: NAD, appears comfortable Cardiovascular: Bradycardic, regular rhythm, no murmurs auscultated over pulmonic or aortic posts.  Pulmonary/Chest: CTAB, I heard normal breath sounds.  Extremities: No pretibial pitting edema.  Psychiatric: Normal mood and affect  Assessment & Plan:   Essential hypertension Today her bp is controlled at 124/86 and she is euvolemic on physical exam. She is taking metoprolol, lasix, entresto, and spironolactone as prescribed and is tolerating these well.   Will encourage patient to continue with medications and follow  up in 4 months.   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   Will encourage to continue this medication and follow up in 4 months.   Chronic systolic heart failure (HCC) She is taking entresto, spironolactone, metoprolol, and lasix as prescribed and tolerates these medications. She is euvolemic on exam today. She denies SOB, PND, orthopnea, abdominal distention, or noticing peripheral edema.  She has chest pain that she describes as minor, occurring months apart, and associated with indigestion and relieved with subsequent burping. She has dizziness upon lying down and turning her head while lying down, the last occasion of this which was last week.   Will encourage to continue with medications, to bring them at next visit, and to follow up in 4 months.    Osteoporosis Currently taking alendronate 2x/week.   Will encourage to continue med and monitor at follow up in 4 months.   Major depressive disorder, recurrent, moderate (HCC) She is well dressed and appears in good spirits today. She has been taking zoloft as prescribed and tolerates this medication.   Will continue to encourage this medication regimen and monitor at follow up in 4 months.   Chronic midline low back pain without sciatica Currently takes percocet BID for chronic back pain and reports this sufficiently alleviates her pain.   Will continue this medication regimen and follow up in 4 months.

## 2023-06-03 NOTE — Assessment & Plan Note (Signed)
Currently taking alendronate 2x/week.   Will encourage to continue med and monitor at follow up in 4 months.

## 2023-06-03 NOTE — Assessment & Plan Note (Signed)
Today her bp is controlled at 124/86 and she is euvolemic on physical exam. She is taking metoprolol, lasix, entresto, and spironolactone as prescribed and is tolerating these well.   Will encourage patient to continue with medications and follow up in 4 months.

## 2023-06-03 NOTE — Patient Instructions (Signed)
Hello Kristin Knight,   It was wonderful seeing you today! I am so glad you are doing well, and I would like to see you in 4 months for a follow up. Please continue to take all of your medications and bring them to your follow up with [place].   If you have any questions or concerns, call our clinic at [phone] or after hours call [phone] and ask for the internal medicine resident on call.   Thank you!

## 2023-06-20 ENCOUNTER — Ambulatory Visit: Payer: Medicare HMO

## 2023-06-22 ENCOUNTER — Ambulatory Visit (INDEPENDENT_AMBULATORY_CARE_PROVIDER_SITE_OTHER): Payer: Medicare HMO

## 2023-06-22 VITALS — Ht 64.0 in | Wt 106.0 lb

## 2023-06-22 DIAGNOSIS — Z0000 Encounter for general adult medical examination without abnormal findings: Secondary | ICD-10-CM | POA: Diagnosis not present

## 2023-06-22 NOTE — Patient Instructions (Addendum)
Ms. Stewart , Thank you for taking time to come for your Medicare Wellness Visit. I appreciate your ongoing commitment to your health goals. Please review the following plan we discussed and let me know if I can assist you in the future.   Referrals/Orders/Follow-Ups/Clinician Recommendations: You are due for a Shingrix and Tetanus vaccine and can get these at your local pharmacy.  You are also due for a bone density screening.  Please call [facility] to schedule this screening, [phone].  Also call them to schedule for your CT lung screening as well.  This is a list of the screening recommended for you and due dates:  Health Maintenance  Topic Date Due   Zoster (Shingles) Vaccine (1 of 2) Never done   DTaP/Tdap/Td vaccine (2 - Td or Tdap) 05/08/2022   COVID-19 Vaccine (3 - 2023-24 season) 07/16/2022   Flu Shot  06/16/2023   Screening for Lung Cancer  02/14/2024   Medicare Annual Wellness Visit  06/21/2024   Pneumonia Vaccine  Completed   DEXA scan (bone density measurement)  Completed   Hepatitis C Screening  Completed   HPV Vaccine  Aged Out   Colon Cancer Screening  Discontinued    Advanced directives: (Declined) Advance directive discussed with you today. Even though you declined this today, please call our office should you change your mind, and we can give you the proper paperwork for you to fill out.  Next Medicare Annual Wellness Visit scheduled for next year: No  Preventive Care 76 Years and Older, Female Preventive care refers to lifestyle choices and visits with your health care provider that can promote health and wellness. What does preventive care include? A yearly physical exam. This is also called an annual well check. Dental exams once or twice a year. Routine eye exams. Ask your health care provider how often you should have your eyes checked. Personal lifestyle choices, including: Daily care of your teeth and gums. Regular physical  activity. Eating a healthy diet. Avoiding tobacco and drug use. Limiting alcohol use. Practicing safe sex. Taking low-dose aspirin every day. Taking vitamin and mineral supplements as recommended by your health care provider. What happens during an annual well check? The services and screenings done by your health care provider during your annual well check will depend on your age, overall health, lifestyle risk factors, and family history of disease. Counseling  Your health care provider may ask you questions about your: Alcohol use. Tobacco use. Drug use. Emotional well-being. Home and relationship well-being. Sexual activity. Eating habits. History of falls. Memory and ability to understand (cognition). Work and work Astronomer. Reproductive health. Screening  You may have the following tests or measurements: Height, weight, and BMI. Blood pressure. Lipid and cholesterol levels. These may be checked every 5 years, or more frequently if you are over 76 years old. Skin check. Lung cancer screening. You may have this screening every year starting at age 76 if you have a 30-pack-year history of smoking and currently smoke or have quit within the past 15 years. Fecal occult blood test (FOBT) of the stool. You may have this test every year starting at age 76. Flexible sigmoidoscopy or colonoscopy. You may have a sigmoidoscopy every 5 years or a colonoscopy every 10 years starting at age 76. Hepatitis C blood test. Hepatitis B blood test. Sexually transmitted disease (STD) testing. Diabetes screening. This is done by checking your blood sugar (glucose) after you have not eaten for a while (fasting). You  may have this done every 1-3 years. Bone density scan. This is done to screen for osteoporosis. You may have this done starting at age 76. Mammogram. This may be done every 1-2 years. Talk to your health care provider about how often you should have regular mammograms. Talk with your  health care provider about your test results, treatment options, and if necessary, the need for more tests. Vaccines  Your health care provider may recommend certain vaccines, such as: Influenza vaccine. This is recommended every year. Tetanus, diphtheria, and acellular pertussis (Tdap, Td) vaccine. You may need a Td booster every 10 years. Zoster vaccine. You may need this after age 76. Pneumococcal 13-valent conjugate (PCV13) vaccine. One dose is recommended after age 76. Pneumococcal polysaccharide (PPSV23) vaccine. One dose is recommended after age 76. Talk to your health care provider about which screenings and vaccines you need and how often you need them. This information is not intended to replace advice given to you by your health care provider. Make sure you discuss any questions you have with your health care provider. Document Released: 11/28/2015 Document Revised: 07/21/2016 Document Reviewed: 09/02/2015 Elsevier Interactive Patient Education  2017 [employer].  Fall Prevention in the Home Falls can cause injuries. They can happen to people of all ages. There are many things you can do to make your home safe and to help prevent falls. What can I do on the outside of my home? Regularly fix the edges of walkways and driveways and fix any cracks. Remove anything that might make you trip as you walk through a door, such as a raised step or threshold. Trim any bushes or trees on the path to your home. Use bright outdoor lighting. Clear any walking paths of anything that might make someone trip, such as rocks or tools. Regularly check to see if handrails are loose or broken. Make sure that both sides of any steps have handrails. Any raised decks and porches should have guardrails on the edges. Have any leaves, snow, or ice cleared regularly. Use sand or salt on walking paths during winter. Clean up any spills in your garage right away. This includes oil or grease spills. What can I  do in the bathroom? Use night lights. Install grab bars by the toilet and in the tub and shower. Do not use towel bars as grab bars. Use non-skid mats or decals in the tub or shower. If you need to sit down in the shower, use a plastic, non-slip stool. Keep the floor dry. Clean up any water that spills on the floor as soon as it happens. Remove soap buildup in the tub or shower regularly. Attach bath mats securely with double-sided non-slip rug tape. Do not have throw rugs and other things on the floor that can make you trip. What can I do in the bedroom? Use night lights. Make sure that you have a light by your bed that is easy to reach. Do not use any sheets or blankets that are too big for your bed. They should not hang down onto the floor. Have a firm chair that has side arms. You can use this for support while you get dressed. Do not have throw rugs and other things on the floor that can make you trip. What can I do in the kitchen? Clean up any spills right away. Avoid walking on wet floors. Keep items that you use a lot in easy-to-reach places. If you need to reach something above you, use a strong  step stool that has a grab bar. Keep electrical cords out of the way. Do not use floor polish or wax that makes floors slippery. If you must use wax, use non-skid floor wax. Do not have throw rugs and other things on the floor that can make you trip. What can I do with my stairs? Do not leave any items on the stairs. Make sure that there are handrails on both sides of the stairs and use them. Fix handrails that are broken or loose. Make sure that handrails are as long as the stairways. Check any carpeting to make sure that it is firmly attached to the stairs. Fix any carpet that is loose or worn. Avoid having throw rugs at the top or bottom of the stairs. If you do have throw rugs, attach them to the floor with carpet tape. Make sure that you have a light switch at the top of the stairs  and the bottom of the stairs. If you do not have them, ask someone to add them for you. What else can I do to help prevent falls? Wear shoes that: Do not have high heels. Have rubber bottoms. Are comfortable and fit you well. Are closed at the toe. Do not wear sandals. If you use a stepladder: Make sure that it is fully opened. Do not climb a closed stepladder. Make sure that both sides of the stepladder are locked into place. Ask someone to hold it for you, if possible. Clearly mark and make sure that you can see: Any grab bars or handrails. First and last steps. Where the edge of each step is. Use tools that help you move around (mobility aids) if they are needed. These include: Canes. Walkers. Scooters. Crutches. Turn on the lights when you go into a dark area. Replace any light bulbs as soon as they burn out. Set up your furniture so you have a clear path. Avoid moving your furniture around. If any of your floors are uneven, fix them. If there are any pets around you, be aware of where they are. Review your medicines with your doctor. Some medicines can make you feel dizzy. This can increase your chance of falling. Ask your doctor what other things that you can do to help prevent falls. This information is not intended to replace advice given to you by your health care provider. Make sure you discuss any questions you have with your health care provider. Document Released: 08/28/2009 Document Revised: 04/08/2016 Document Reviewed: 12/06/2014 Elsevier Interactive Patient Education  2017 [employer] Managing Pain Without Opioids Opioids are strong medicines used to treat moderate to severe pain. For some people, especially those who have long-term (chronic) pain, opioids may not be the best choice for pain management due to: Side effects like nausea, constipation, and sleepiness. The risk of addiction (opioid use disorder). The longer you take opioids, the greater your risk of  addiction. Pain that lasts for more than 3 months is called chronic pain. Managing chronic pain usually requires more than one approach and is often provided by a team of health care providers working together (multidisciplinary approach). Pain management may be done at a pain management center or pain clinic. How to manage pain without the use of opioids Use non-opioid medicines Non-opioid medicines for pain may include: Over-the-counter or prescription non-steroidal anti-inflammatory drugs (NSAIDs). These may be the first medicines used for pain. They work well for muscle and bone pain, and they reduce swelling. Acetaminophen. This over-the-counter medicine may work well  for milder pain but not swelling. Antidepressants. These may be used to treat chronic pain. A certain type of antidepressant (tricyclics) is often used. These medicines are given in lower doses for pain than when used for depression. Anticonvulsants. These are usually used to treat seizures but may also reduce nerve (neuropathic) pain. Muscle relaxants. These relieve pain caused by sudden muscle tightening (spasms). You may also use a pain medicine that is applied to the skin as a patch, cream, or gel (topical analgesic), such as a numbing medicine. These may cause fewer side effects than medicines taken by mouth. Do certain therapies as directed Some therapies can help with pain management. They include: Physical therapy. You will do exercises to gain strength and flexibility. A physical therapist may teach you exercises to move and stretch parts of your body that are weak, stiff, or painful. You can learn these exercises at physical therapy visits and practice them at home. Physical therapy may also involve: Massage. Heat wraps or applying heat or cold to affected areas. Electrical signals that interrupt pain signals (transcutaneous electrical nerve stimulation, TENS). Weak lasers that reduce pain and swelling (low-level laser  therapy). Signals from your body that help you learn to regulate pain (biofeedback). Occupational therapy. This helps you to learn ways to function at home and work with less pain. Recreational therapy. This involves trying new activities or hobbies, such as a physical activity or drawing. Mental health therapy, including: Cognitive behavioral therapy (CBT). This helps you learn coping skills for dealing with pain. Acceptance and commitment therapy (ACT) to change the way you think and react to pain. Relaxation therapies, including muscle relaxation exercises and mindfulness-based stress reduction. Pain management counseling. This may be individual, family, or group counseling.  Receive medical treatments Medical treatments for pain management include: Nerve block injections. These may include a pain blocker and anti-inflammatory medicines. You may have injections: Near the spine to relieve chronic back or neck pain. Into joints to relieve back or joint pain. Into nerve areas that supply a painful area to relieve body pain. Into muscles (trigger point injections) to relieve some painful muscle conditions. A medical device placed near your spine to help block pain signals and relieve nerve pain or chronic back pain (spinal cord stimulation device). Acupuncture. Follow these instructions at home Medicines Take over-the-counter and prescription medicines only as told by your health care provider. If you are taking pain medicine, ask your health care providers about possible side effects to watch out for. Do not drive or use heavy machinery while taking prescription opioid pain medicine. Lifestyle  Do not use drugs or alcohol to reduce pain. If you drink alcohol, limit how much you have to: 0-1 drink a day for women who are not pregnant. 0-2 drinks a day for men. Know how much alcohol is in a drink. In the [place] one drink equals one 12 oz bottle of beer (355 mL), one 5 oz glass of wine (148  mL), or one 1 oz glass of hard liquor (44 mL). Do not use any products that contain nicotine or tobacco. These products include cigarettes, chewing tobacco, and vaping devices, such as e-cigarettes. If you need help quitting, ask your health care provider. Eat a healthy diet and maintain a healthy weight. Poor diet and excess weight may make pain worse. Eat foods that are high in fiber. These include fresh fruits and vegetables, whole grains, and beans. Limit foods that are high in fat and processed sugars, such as fried and  sweet foods. Exercise regularly. Exercise lowers stress and may help relieve pain. Ask your health care provider what activities and exercises are safe for you. If your health care provider approves, join an exercise class that combines movement and stress reduction. Examples include yoga and tai chi. Get enough sleep. Lack of sleep may make pain worse. Lower stress as much as possible. Practice stress reduction techniques as told by your therapist. General instructions Work with all your pain management providers to find the treatments that work best for you. You are an important member of your pain management team. There are many things you can do to reduce pain on your own. Consider joining an online or in-person support group for people who have chronic pain. Keep all follow-up visits. This is important. Where to find more information You can find more information about managing pain without opioids from: American Academy of Pain Medicine: painmed.org Institute for Chronic Pain: instituteforchronicpain.org American Chronic Pain Association: theacpa.org Contact a health care provider if: You have side effects from pain medicine. Your pain gets worse or does not get better with treatments or home therapy. You are struggling with anxiety or depression. Summary Many types of pain can be managed without opioids. Chronic pain may respond better to pain management without  opioids. Pain is best managed when you and a team of health care providers work together. Pain management without opioids may include non-opioid medicines, medical treatments, physical therapy, mental health therapy, and lifestyle changes. Tell your health care providers if your pain gets worse or is not being managed well enough. This information is not intended to replace advice given to you by your health care provider. Make sure you discuss any questions you have with your health care provider. Document Revised: 02/11/2021 Document Reviewed: 02/11/2021 Elsevier Patient Education  2024 [employer].
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
## 2023-06-27 ENCOUNTER — Ambulatory Visit (INDEPENDENT_AMBULATORY_CARE_PROVIDER_SITE_OTHER): Payer: Medicare HMO | Admitting: Pharmacist

## 2023-06-27 DIAGNOSIS — I779 Disorder of arteries and arterioles, unspecified: Secondary | ICD-10-CM | POA: Diagnosis not present

## 2023-06-27 DIAGNOSIS — Z7901 Long term (current) use of anticoagulants: Secondary | ICD-10-CM

## 2023-06-27 DIAGNOSIS — Z5181 Encounter for therapeutic drug level monitoring: Secondary | ICD-10-CM | POA: Diagnosis not present

## 2023-06-27 DIAGNOSIS — T82868S Thrombosis of vascular prosthetic devices, implants and grafts, sequela: Secondary | ICD-10-CM

## 2023-06-27 LAB — POCT INR: INR: 2.1 (ref 2.0–3.0)

## 2023-06-27 NOTE — Patient Instructions (Signed)
Patient instructed to take medications as defined in the Anti-coagulation Track section of this encounter.  Patient instructed to take today's dose.  Patient instructed to take one-half (1/2) of your 4 milligram strength, blue-colored warfarin tablet on Saturdays and Sundays. All OTHER DAYS,  Mondays, Tuesdays, Wednesdays, Thursdays and Fridays, take one (1) tablet.  Patient verbalized understanding of these instructions.
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
## 2023-06-29 ENCOUNTER — Other Ambulatory Visit: Payer: Self-pay

## 2023-06-29 DIAGNOSIS — G8929 Other chronic pain: Secondary | ICD-10-CM

## 2023-06-29 NOTE — Telephone Encounter (Signed)
Last rx written - 7/11/23/22. Last OV - 06/03/23. Next OV - has not been scheduled. TOX - 10/22/22.

## 2023-06-30 MED ORDER — OXYCODONE-ACETAMINOPHEN 7.5-325 MG PO TABS
1.0000 | ORAL_TABLET | Freq: Three times a day (TID) | ORAL | 0 refills | Status: DC | PRN
Start: 2023-06-30 — End: 2023-07-25

## 2023-07-08 ENCOUNTER — Ambulatory Visit (HOSPITAL_COMMUNITY): Payer: Medicare HMO

## 2023-07-12 ENCOUNTER — Other Ambulatory Visit: Payer: Self-pay | Admitting: Internal Medicine

## 2023-07-14 NOTE — Progress Notes (Signed)
Internal Medicine Clinic Attending  Case and documentation of CMA Tyus reviewed.  I reviewed the AWV findings.  I agree with the plan of care documented in the AWV note.    Debe Coder, MD

## 2023-07-25 ENCOUNTER — Ambulatory Visit: Payer: Medicare HMO | Admitting: Pharmacist

## 2023-07-25 ENCOUNTER — Other Ambulatory Visit: Payer: Self-pay

## 2023-07-25 DIAGNOSIS — I779 Disorder of arteries and arterioles, unspecified: Secondary | ICD-10-CM | POA: Diagnosis not present

## 2023-07-25 DIAGNOSIS — Z7901 Long term (current) use of anticoagulants: Secondary | ICD-10-CM

## 2023-07-25 DIAGNOSIS — Z23 Encounter for immunization: Secondary | ICD-10-CM

## 2023-07-25 DIAGNOSIS — T82868S Thrombosis of vascular prosthetic devices, implants and grafts, sequela: Secondary | ICD-10-CM | POA: Diagnosis not present

## 2023-07-25 DIAGNOSIS — G8929 Other chronic pain: Secondary | ICD-10-CM

## 2023-07-25 LAB — POCT INR: INR: 2.2 (ref 2.0–3.0)
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
## 2023-07-25 NOTE — Patient Instructions (Signed)
Patient instructed to take medications as defined in the Anti-coagulation Track section of this encounter.  Patient instructed to take today's dose.  Patient instructed to take one-half (1/2) of your 4 milligram strength, blue-colored warfarin tablet on Sundays. All OTHER DAYS,  Mondays, Tuesdays, Wednesdays, Thursdays and Fridays, and Saturdays,  take one (1) tablet.  Patient verbalized understanding of these instructions.

## 2023-07-26 MED ORDER — OXYCODONE-ACETAMINOPHEN 7.5-325 MG PO TABS
1.0000 | ORAL_TABLET | Freq: Three times a day (TID) | ORAL | 0 refills | Status: DC | PRN
Start: 2023-07-26 — End: 2023-08-22

## 2023-08-08 ENCOUNTER — Other Ambulatory Visit: Payer: Self-pay | Admitting: Internal Medicine

## 2023-08-10 ENCOUNTER — Encounter: Payer: Self-pay | Admitting: Internal Medicine

## 2023-08-16 ENCOUNTER — Other Ambulatory Visit: Payer: Self-pay

## 2023-08-16 ENCOUNTER — Emergency Department (HOSPITAL_COMMUNITY): Payer: Medicare HMO

## 2023-08-16 ENCOUNTER — Encounter (HOSPITAL_COMMUNITY): Payer: Self-pay

## 2023-08-16 ENCOUNTER — Emergency Department (HOSPITAL_COMMUNITY)
Admission: EM | Admit: 2023-08-16 | Discharge: 2023-08-16 | Disposition: A | Discharge status: Home / Self Care | Payer: Medicare HMO | Attending: Emergency Medicine | Admitting: Emergency Medicine

## 2023-08-16 DIAGNOSIS — Z79899 Other long term (current) drug therapy: Secondary | ICD-10-CM | POA: Insufficient documentation

## 2023-08-16 DIAGNOSIS — Z7901 Long term (current) use of anticoagulants: Secondary | ICD-10-CM | POA: Insufficient documentation

## 2023-08-16 DIAGNOSIS — I110 Hypertensive heart disease with heart failure: Secondary | ICD-10-CM | POA: Diagnosis not present

## 2023-08-16 DIAGNOSIS — J441 Chronic obstructive pulmonary disease with (acute) exacerbation: Secondary | ICD-10-CM | POA: Insufficient documentation

## 2023-08-16 DIAGNOSIS — I509 Heart failure, unspecified: Secondary | ICD-10-CM | POA: Diagnosis not present

## 2023-08-16 DIAGNOSIS — Z20822 Contact with and (suspected) exposure to covid-19: Secondary | ICD-10-CM | POA: Insufficient documentation

## 2023-08-16 DIAGNOSIS — Z7952 Long term (current) use of systemic steroids: Secondary | ICD-10-CM | POA: Diagnosis not present

## 2023-08-16 DIAGNOSIS — R0602 Shortness of breath: Secondary | ICD-10-CM | POA: Diagnosis present

## 2023-08-16 LAB — CBC WITH DIFFERENTIAL/PLATELET
Abs Immature Granulocytes: 0.01 10*3/uL (ref 0.00–0.07)
Basophils Absolute: 0 10*3/uL (ref 0.0–0.1)
Basophils Relative: 1 %
Eosinophils Absolute: 0.2 10*3/uL (ref 0.0–0.5)
Eosinophils Relative: 5 %
HCT: 39.2 % (ref 36.0–46.0)
Hemoglobin: 11.8 g/dL — ABNORMAL LOW (ref 12.0–15.0)
Immature Granulocytes: 0 %
Lymphocytes Relative: 28 %
Lymphs Abs: 1.2 10*3/uL (ref 0.7–4.0)
MCH: 28 pg (ref 26.0–34.0)
MCHC: 30.1 g/dL (ref 30.0–36.0)
MCV: 93.1 fL (ref 80.0–100.0)
Monocytes Absolute: 0.4 10*3/uL (ref 0.1–1.0)
Monocytes Relative: 9 %
Neutro Abs: 2.5 10*3/uL (ref 1.7–7.7)
Neutrophils Relative %: 57 %
Platelets: 193 10*3/uL (ref 150–400)
RBC: 4.21 MIL/uL (ref 3.87–5.11)
RDW: 14 % (ref 11.5–15.5)
WBC: 4.3 10*3/uL (ref 4.0–10.5)
nRBC: 0 % (ref 0.0–0.2)

## 2023-08-16 LAB — SARS CORONAVIRUS 2 BY RT PCR: SARS Coronavirus 2 by RT PCR: NEGATIVE

## 2023-08-16 LAB — COMPREHENSIVE METABOLIC PANEL
ALT: 13 U/L (ref 0–44)
AST: 21 U/L (ref 15–41)
Albumin: 4 g/dL (ref 3.5–5.0)
Alkaline Phosphatase: 59 U/L (ref 38–126)
Anion gap: 12 (ref 5–15)
BUN: 14 mg/dL (ref 8–23)
CO2: 27 mmol/L (ref 22–32)
Calcium: 9.7 mg/dL (ref 8.9–10.3)
Chloride: 98 mmol/L (ref 98–111)
Creatinine, Ser: 0.95 mg/dL (ref 0.44–1.00)
GFR, Estimated: >60 mL/min (ref >60)
Glucose, Bld: 115 mg/dL — ABNORMAL HIGH (ref 70–99)
Potassium: 3.7 mmol/L (ref 3.5–5.1)
Sodium: 137 mmol/L (ref 135–145)
Total Bilirubin: 0.6 mg/dL (ref 0.3–1.2)
Total Protein: 7.3 g/dL (ref 6.5–8.1)

## 2023-08-16 LAB — TROPONIN I (HIGH SENSITIVITY): Troponin I (High Sensitivity): <2 ng/L (ref <18)

## 2023-08-16 LAB — PROTIME-INR
INR: 3.2 — ABNORMAL HIGH (ref 0.8–1.2)
Prothrombin Time: 32.8 s — ABNORMAL HIGH (ref 11.4–15.2)

## 2023-08-16 LAB — BRAIN NATRIURETIC PEPTIDE: B Natriuretic Peptide: 113.2 pg/mL — ABNORMAL HIGH (ref 0.0–100.0)

## 2023-08-16 MED ORDER — PREDNISONE 20 MG PO TABS: 40.0000 mg | ORAL_TABLET | Freq: Every day | ORAL | 0 refills | Status: DC

## 2023-08-16 MED ORDER — PREDNISONE 20 MG PO TABS
40.0000 mg | ORAL_TABLET | Freq: Once | ORAL | Status: AC
  Administered 2023-08-16: 40 mg via ORAL
  Filled 2023-08-16: qty 2

## 2023-08-16 MED ORDER — ALBUTEROL SULFATE (2.5 MG/3ML) 0.083% IN NEBU
5.0000 mg | INHALATION_SOLUTION | Freq: Once | RESPIRATORY_TRACT | Status: AC
  Administered 2023-08-16: 5 mg via RESPIRATORY_TRACT
  Filled 2023-08-16: qty 6

## 2023-08-16 MED ORDER — ALBUTEROL SULFATE HFA 108 (90 BASE) MCG/ACT IN AERS
2.0000 | INHALATION_SPRAY | Freq: Once | RESPIRATORY_TRACT | Status: AC | PRN
  Administered 2023-08-16: 2 via RESPIRATORY_TRACT
  Filled 2023-08-16: qty 6.7

## 2023-08-16 MED ORDER — IPRATROPIUM BROMIDE 0.02 % IN SOLN
0.5000 mg | Freq: Once | RESPIRATORY_TRACT | Status: AC
  Administered 2023-08-16: 0.5 mg via RESPIRATORY_TRACT
  Filled 2023-08-16: qty 2.5

## 2023-08-16 NOTE — ED Provider Notes (Signed)
Kristin Knight EMERGENCY DEPARTMENT AT [facility] Provider Note   CSN: [id number] Arrival date & time: 08/16/23  4098     History  Chief Complaint  Patient presents with   Shortness of Breath    Kristin Knight is a 77 y.o. female.  Patient is a 77 year old female with a history of hypertension, PAD status post left femoropopliteal bypass, PE on chronic Coumadin therapy, COPD, CHF who is presenting today with complaints of shortness of breath that started on Friday.  She states it is exacerbated by any type of exertion and she is now only able to walk very short distances before getting winded and having to lay down.  She has also had a mild cough with white sputum and wheezing.  She will use her albuterol when she feels this way and it helps for a short time but she is having to use the albuterol multiple times a day when normally she does not even use it once a week.  When she feels short of breath she will have some tightness in her chest which is also relieved with albuterol.  She has had no unilateral leg pain or swelling.  No distal edema in her legs.  She is compliant with her medications.  She denies any abdominal pain nausea or vomiting.  The history is provided by the patient and medical records.  Shortness of Breath      Home Medications Prior to Admission medications   Medication Sig Start Date End Date Taking? Authorizing Provider  predniSONE (DELTASONE) 20 MG tablet Take 2 tablets (40 mg total) by mouth daily. 08/16/23  Yes Evamaria Detore, Alphonzo Lemmings, MD  albuterol (VENTOLIN HFA) 108 (90 Base) MCG/ACT inhaler Inhale 2 puffs into the lungs every 6 (six) hours as needed for wheezing or shortness of breath.    [provider]  alendronate (FOSAMAX) 70 MG tablet Take 1 tablet (70 mg total) by mouth every 7 (seven) days. Take with a full glass of water on an empty stomach. 10/22/22 10/22/23  [name], MD  atorvastatin (LIPITOR) 40 MG tablet Take 1 tablet by mouth  once daily 03/21/23   [name], MD  furosemide (LASIX) 40 MG tablet Take 1 tablet (40 mg total) by mouth daily. 05/13/23   [name], MD  metoprolol succinate (TOPROL-XL) 25 MG 24 hr tablet Take 1 tablet (25 mg total) by mouth daily. 05/13/23   [name], MD  oxyCODONE-acetaminophen (PERCOCET) 7.5-325 MG tablet Take 1 tablet by mouth every 8 (eight) hours as needed for severe pain. 07/26/23   [name], MD  sacubitril-valsartan (ENTRESTO) 97-103 MG Take 1 tablet by mouth 2 (two) times daily. 05/13/23   [name], MD  sertraline (ZOLOFT) 50 MG tablet Take 1 tablet (50 mg total) by mouth daily. 10/22/22   [name], MD  spironolactone (ALDACTONE) 25 MG tablet Take 1 tablet (25 mg total) by mouth daily. 05/13/23   [name], MD  warfarin (COUMADIN) 4 MG tablet TAKE 1 TABLET BY MOUTH ONCE DAILY AT 4 PM 08/09/23   [name], MD      Allergies    Penicillins, Meperidine hcl, and Chantix [varenicline]    Review of Systems   Review of Systems  Respiratory:  Positive for shortness of breath.     Physical Exam Updated Vital Signs BP 129/88   Pulse 64   Temp (!) 97.5 F (36.4 C)   Resp (!) 22   Ht  5\' 4"  (1.626 m)   Wt 48.1 kg   LMP 01/13/1974   SpO2 100%   BMI 18.19 kg/m  Physical Exam Vitals and nursing note reviewed.  Constitutional:      General: She is in acute distress.     Appearance: She is well-developed.  HENT:     Head: Normocephalic and atraumatic.     Mouth/Throat:     Mouth: Mucous membranes are moist.  Eyes:     Pupils: Pupils are equal, round, and reactive to light.  Neck:     Comments: No JVD Cardiovascular:     Rate and Rhythm: Normal rate and regular rhythm.     Heart sounds: Normal heart sounds. No murmur heard.    No friction rub.  Pulmonary:     Effort: Pulmonary effort is normal. No tachypnea.     Breath sounds: Examination of the right-lower field reveals decreased breath sounds. Examination of the left-lower field  reveals decreased breath sounds and wheezing. Decreased breath sounds and wheezing present. No rales.  Abdominal:     General: Bowel sounds are normal. There is no distension.     Palpations: Abdomen is soft.     Tenderness: There is no abdominal tenderness. There is no guarding or rebound.  Musculoskeletal:        General: No tenderness. Normal range of motion.     Right lower leg: No edema.     Left lower leg: No edema.     Comments: No edema  Skin:    General: Skin is warm and dry.     Findings: No rash.  Neurological:     Mental Status: She is alert and oriented to person, place, and time.     Cranial Nerves: No cranial nerve deficit.  Psychiatric:        Behavior: Behavior normal.     ED Results / Procedures / Treatments   Labs (all labs ordered are listed, but only abnormal results are displayed) Labs Reviewed  CBC WITH DIFFERENTIAL/PLATELET - Abnormal; Notable for the following components:      Result Value   Hemoglobin 11.8 (*)    All other components within normal limits  COMPREHENSIVE METABOLIC PANEL - Abnormal; Notable for the following components:   Glucose, Bld 115 (*)    All other components within normal limits  BRAIN NATRIURETIC PEPTIDE - Abnormal; Notable for the following components:   B Natriuretic Peptide 113.2 (*)    All other components within normal limits  PROTIME-INR - Abnormal; Notable for the following components:   Prothrombin Time 32.8 (*)    INR 3.2 (*)    All other components within normal limits  SARS CORONAVIRUS 2 BY RT PCR  TROPONIN I (HIGH SENSITIVITY)    EKG EKG Interpretation Date/Time:  Tuesday August 16 2023 07:36:02 EDT Ventricular Rate:  78 PR Interval:  122 QRS Duration:  84 QT Interval:  388 QTC Calculation: 442 R Axis:   71  Text Interpretation: Sinus rhythm with Premature supraventricular complexes Minimal voltage criteria for LVH, may be normal variant ( Sokolow-Lyon ) T wave abnormality, consider inferior ischemia No  significant change since 12/11/22 When compared with ECG of 09-Jan-2023 15:39, PREVIOUS ECG IS PRESENT Confirmed by Gwyneth Sprout ([id number]) on 08/16/2023 8:40:37 AM  Radiology DG Chest 2 View  Result Date: 08/16/2023 CLINICAL DATA:  SOB.  History of COPD. EXAM: CHEST - 2 VIEW COMPARISON:  01/09/2023. FINDINGS: Bilateral lungs appear hyperexpanded and hyperlucent with coarse bronchovascular markings, in keeping with  COPD. Linear area of atelectasis/scarring noted in the left retrocardiac region. Bilateral lungs otherwise appear clear. No dense consolidation or major lung collapse. Bilateral costophrenic angles are clear. Normal cardio-mediastinal silhouette. No acute osseous abnormalities. The soft tissues are within normal limits. IMPRESSION: *No acute cardiopulmonary process. COPD. Electronically Signed   By: Jules Schick M.D.   On: 08/16/2023 08:22    Procedures Procedures    Medications Ordered in ED Medications  albuterol (VENTOLIN HFA) 108 (90 Base) MCG/ACT inhaler 2 puff (has no administration in time range)  albuterol (PROVENTIL) (2.5 MG/3ML) 0.083% nebulizer solution 5 mg (5 mg Nebulization Given 08/16/23 1047)  ipratropium (ATROVENT) nebulizer solution 0.5 mg (0.5 mg Nebulization Given 08/16/23 1047)  predniSONE (DELTASONE) tablet 40 mg (40 mg Oral Given 08/16/23 1046)    ED Course/ Medical Decision Making/ A&P                                 Medical Decision Making Amount and/or Complexity of Data Reviewed External Data Reviewed: notes. Labs: ordered. Decision-making details documented in ED Course. Radiology: ordered and independent interpretation performed. Decision-making details documented in ED Course. ECG/medicine tests: ordered and independent interpretation performed. Decision-making details documented in ED Course.  Risk Prescription drug management.   Pt with multiple medical problems and comorbidities and presenting today with a complaint that caries a high risk  for morbidity and mortality.  Here today with dyspnea on exertion, cough and occasional wheezing.  Patient's symptoms are improved by albuterol and she denies any orthopnea.  Suspicious for COPD exacerbation versus pneumonia versus viral illness.  Lower suspicion for CHF or atypical ACS as well as low suspicion for PE as patient is chronically anticoagulated..  Patient in no distress at this time with reassuring vital signs with O2 sats of 100%.  She does have diminished breath sounds in the bases with a scant wheeze in the left lower lobe with forced expiration.  I independently interpreted patient's EKG and labs.  Patient does have ST changes on EKG but looks improved from her EKG in January of this year.  CBC within normal limits, CMP without acute findings, troponin is negative, BNP significantly improved and now 113, INR is therapeutic at 3.2 and COVID is negative.  I have independently visualized and interpreted pt's images today. Chest x-ray without acute findings.  All of these findings were discussed with the patient.  At this time feel that patient has COPD exacerbation.  With getting up and walking to the bathroom she became very tachypneic but sats remain normal.  She was given albuterol Atrovent and prednisone.  On recheck after meds patient is feeling much better.  At this time feel that patient is stable for discharge.  She feels comfortable going home and will return if her symptoms worsen.  She was given a new inhaler here and a course of steroids to take at home.         Final Clinical Impression(s) / ED Diagnoses Final diagnoses:  COPD exacerbation (HCC)    Rx / DC Orders ED Discharge Orders          Ordered    predniSONE (DELTASONE) 20 MG tablet  Daily        08/16/23 1203              Gwyneth Sprout, MD 08/16/23 1204

## 2023-08-16 NOTE — ED Triage Notes (Signed)
Pt came in via POV d/t SOB that has worsened the past 3 days. Does report Hx of COPD, A/Ox4, denies any pain.

## 2023-08-16 NOTE — Discharge Instructions (Addendum)
Everything with your heart looks normal today.  COVID is negative.  This is most likely a flare of your COPD.  Continue to use your inhaler as needed.  You will take your next dose of steroids tomorrow.  If your symptoms start getting worse return to the emergency room

## 2023-08-22 ENCOUNTER — Ambulatory Visit: Payer: Medicare HMO | Admitting: Pharmacist

## 2023-08-22 ENCOUNTER — Other Ambulatory Visit: Payer: Self-pay | Admitting: Internal Medicine

## 2023-08-22 DIAGNOSIS — I779 Disorder of arteries and arterioles, unspecified: Secondary | ICD-10-CM

## 2023-08-22 DIAGNOSIS — Z7901 Long term (current) use of anticoagulants: Secondary | ICD-10-CM | POA: Diagnosis not present

## 2023-08-22 DIAGNOSIS — T82868S Thrombosis of vascular prosthetic devices, implants and grafts, sequela: Secondary | ICD-10-CM

## 2023-08-22 DIAGNOSIS — G8929 Other chronic pain: Secondary | ICD-10-CM

## 2023-08-22 LAB — POCT INR: INR: 7 — AB (ref 2.0–3.0)

## 2023-08-22 NOTE — Telephone Encounter (Signed)
Last rx written - 07/26/23. Last OV - 06/03/23. Next OV - 10/07/23. TOX - 10/22/22.

## 2023-08-22 NOTE — Patient Instructions (Signed)
HOLD/OMIT warfarin for Monday, October 7 and Tuesday, October 8. Resume taking warfarin on Wednesday, October 9 by taking 1/2 of your 4 mg strength blue warfarin tablet. Take one (1) tablet on Thursday October 10; take 1/2 tablet on Friday, October 11; Take one tablet on Saturday, October 12; Take 1/2 tablet on Sunday, October 13. Return to clinic on Monday, October 14th at 3:30 pm for a repeat INR test. Patient advised to eat a dark-green leafy salad for dinner tonight (Monday October 7th) and was provided my phone number [phone]) if there are any questions for remainder of time between now and next visit. Was advised to report to the Emergency Room if there were to be any bleeding that she cannot stop by application of pressure, e.g. blood in urine, blood in stool.

## 2023-08-22 NOTE — Telephone Encounter (Signed)
MED REFILL REQUEST ? ?oxyCODONE-acetaminophen (PERCOCET) 7.5-325 MG tablet ? ?[id number] - [place] ([place]), [place] - [address] Phone:  [phone]  ?Fax:  [phone]  ?  ? ?
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
## 2023-08-24 MED ORDER — OXYCODONE-ACETAMINOPHEN 7.5-325 MG PO TABS
1.0000 | ORAL_TABLET | Freq: Three times a day (TID) | ORAL | 0 refills | Status: DC | PRN
Start: 2023-08-24 — End: 2023-09-26

## 2023-08-30 ENCOUNTER — Encounter (HOSPITAL_COMMUNITY): Payer: Self-pay | Admitting: Emergency Medicine

## 2023-08-30 ENCOUNTER — Other Ambulatory Visit: Payer: Self-pay

## 2023-08-30 ENCOUNTER — Emergency Department (HOSPITAL_COMMUNITY)
Admission: EM | Admit: 2023-08-30 | Discharge: 2023-08-31 | Disposition: A | Discharge status: Home / Self Care | Payer: Medicare HMO | Attending: Emergency Medicine | Admitting: Emergency Medicine

## 2023-08-30 ENCOUNTER — Emergency Department (HOSPITAL_COMMUNITY): Payer: Medicare HMO

## 2023-08-30 DIAGNOSIS — R7989 Other specified abnormal findings of blood chemistry: Secondary | ICD-10-CM

## 2023-08-30 DIAGNOSIS — R600 Localized edema: Secondary | ICD-10-CM | POA: Insufficient documentation

## 2023-08-30 DIAGNOSIS — R0600 Dyspnea, unspecified: Secondary | ICD-10-CM | POA: Insufficient documentation

## 2023-08-30 DIAGNOSIS — Z79899 Other long term (current) drug therapy: Secondary | ICD-10-CM | POA: Insufficient documentation

## 2023-08-30 DIAGNOSIS — Z7951 Long term (current) use of inhaled steroids: Secondary | ICD-10-CM | POA: Diagnosis not present

## 2023-08-30 DIAGNOSIS — Z1152 Encounter for screening for COVID-19: Secondary | ICD-10-CM | POA: Diagnosis not present

## 2023-08-30 DIAGNOSIS — Z7901 Long term (current) use of anticoagulants: Secondary | ICD-10-CM | POA: Insufficient documentation

## 2023-08-30 DIAGNOSIS — R791 Abnormal coagulation profile: Secondary | ICD-10-CM | POA: Insufficient documentation

## 2023-08-30 DIAGNOSIS — J449 Chronic obstructive pulmonary disease, unspecified: Secondary | ICD-10-CM | POA: Insufficient documentation

## 2023-08-30 DIAGNOSIS — N179 Acute kidney failure, unspecified: Secondary | ICD-10-CM | POA: Insufficient documentation

## 2023-08-30 HISTORY — DX: Heart failure, unspecified: I50.9

## 2023-08-30 LAB — CBC
HCT: 41.1 % (ref 36.0–46.0)
Hemoglobin: 12.3 g/dL (ref 12.0–15.0)
MCH: 28.3 pg (ref 26.0–34.0)
MCHC: 29.9 g/dL — ABNORMAL LOW (ref 30.0–36.0)
MCV: 94.7 fL (ref 80.0–100.0)
Platelets: 187 10*3/uL (ref 150–400)
RBC: 4.34 MIL/uL (ref 3.87–5.11)
RDW: 14.1 % (ref 11.5–15.5)
WBC: 5.4 10*3/uL (ref 4.0–10.5)
nRBC: 0 % (ref 0.0–0.2)

## 2023-08-30 LAB — TROPONIN I (HIGH SENSITIVITY): Troponin I (High Sensitivity): 3 ng/L (ref <18)

## 2023-08-30 LAB — BASIC METABOLIC PANEL
Anion gap: 11 (ref 5–15)
BUN: 9 mg/dL (ref 8–23)
CO2: 33 mmol/L — ABNORMAL HIGH (ref 22–32)
Calcium: 9.5 mg/dL (ref 8.9–10.3)
Chloride: 100 mmol/L (ref 98–111)
Creatinine, Ser: 1.18 mg/dL — ABNORMAL HIGH (ref 0.44–1.00)
GFR, Estimated: 48 mL/min — ABNORMAL LOW (ref >60)
Glucose, Bld: 154 mg/dL — ABNORMAL HIGH (ref 70–99)
Potassium: 3.6 mmol/L (ref 3.5–5.1)
Sodium: 144 mmol/L (ref 135–145)

## 2023-08-30 LAB — PROTIME-INR
INR: 1.6 — ABNORMAL HIGH (ref 0.8–1.2)
Prothrombin Time: 19.1 s — ABNORMAL HIGH (ref 11.4–15.2)

## 2023-08-30 MED ORDER — IPRATROPIUM-ALBUTEROL 0.5-2.5 (3) MG/3ML IN SOLN
3.0000 mL | Freq: Once | RESPIRATORY_TRACT | Status: AC
  Administered 2023-08-30: 3 mL via RESPIRATORY_TRACT
  Filled 2023-08-30: qty 3

## 2023-08-30 NOTE — ED Triage Notes (Signed)
Patient reports SOB with central chest tightness onset yesterday worse with exertion , history of CHF/COPD . Denies cough or fever .

## 2023-08-31 ENCOUNTER — Other Ambulatory Visit (HOSPITAL_COMMUNITY): Payer: Self-pay

## 2023-08-31 ENCOUNTER — Emergency Department (HOSPITAL_COMMUNITY): Payer: Medicare HMO

## 2023-08-31 LAB — BRAIN NATRIURETIC PEPTIDE: B Natriuretic Peptide: 237.5 pg/mL — ABNORMAL HIGH (ref 0.0–100.0)

## 2023-08-31 LAB — HEPATIC FUNCTION PANEL
ALT: 14 U/L (ref 0–44)
AST: 20 U/L (ref 15–41)
Albumin: 3.7 g/dL (ref 3.5–5.0)
Alkaline Phosphatase: 53 U/L (ref 38–126)
Bilirubin, Direct: 0.2 mg/dL (ref 0.0–0.2)
Indirect Bilirubin: 0.6 mg/dL (ref 0.3–0.9)
Total Bilirubin: 0.8 mg/dL (ref 0.3–1.2)
Total Protein: 7.1 g/dL (ref 6.5–8.1)

## 2023-08-31 LAB — TROPONIN I (HIGH SENSITIVITY): Troponin I (High Sensitivity): 4 ng/L (ref <18)

## 2023-08-31 LAB — SARS CORONAVIRUS 2 BY RT PCR: SARS Coronavirus 2 by RT PCR: NEGATIVE

## 2023-08-31 MED ORDER — APIXABAN 5 MG PO TABS: 5.0000 mg | ORAL_TABLET | Freq: Two times a day (BID) | ORAL | 0 refills | Status: DC

## 2023-08-31 MED ORDER — METHYLPREDNISOLONE SODIUM SUCC 125 MG IJ SOLR
125.0000 mg | Freq: Once | INTRAMUSCULAR | Status: AC
  Administered 2023-08-31: 125 mg via INTRAVENOUS
  Filled 2023-08-31: qty 2

## 2023-08-31 MED ORDER — FUROSEMIDE 10 MG/ML IJ SOLN
20.0000 mg | Freq: Once | INTRAMUSCULAR | Status: AC
  Administered 2023-08-31: 20 mg via INTRAVENOUS
  Filled 2023-08-31: qty 2

## 2023-08-31 MED ORDER — IOHEXOL 350 MG/ML SOLN
75.0000 mL | Freq: Once | INTRAVENOUS | Status: AC | PRN
  Administered 2023-08-31: 75 mL via INTRAVENOUS

## 2023-08-31 MED ORDER — PREDNISONE 20 MG PO TABS: ORAL_TABLET | ORAL | 0 refills | Status: DC

## 2023-08-31 NOTE — ED Provider Notes (Signed)
[place] EMERGENCY DEPARTMENT AT [facility] Provider Note   CSN: [id number] Arrival date & time: 08/30/23  2155     History  Chief Complaint  Patient presents with   Shortness of Breath    Kristin Knight is a 77 y.o. female.  77 year old female that is here with her daughter secondary to dyspnea.  Patient states she was treated for COPD a couple weeks ago and she initially improved but then the last couple days she has gotten dyspneic again.  Worse with exertion.  Not able to walk as far she normally does without getting significantly short of breath.  Has been using other medications as prescribed. Of note she recently changed her Coumadin dose.  She was supratherapeutic previously but now she is subtherapeutic.  Chronic left leg enlargement.   Shortness of Breath      Home Medications Prior to Admission medications   Medication Sig Start Date End Date Taking? Authorizing Provider  predniSONE (DELTASONE) 20 MG tablet 3 tabs po daily x 3 days, then 2 tabs x 3 days, then 1.5 tabs x 3 days, then 1 tab x 3 days, then 0.5 tabs x 3 days 08/31/23  Yes Aidian Salomon, Barbara Cower, MD  albuterol (VENTOLIN HFA) 108 (90 Base) MCG/ACT inhaler Inhale 2 puffs into the lungs every 6 (six) hours as needed for wheezing or shortness of breath.    [provider]  alendronate (FOSAMAX) 70 MG tablet Take 1 tablet (70 mg total) by mouth every 7 (seven) days. Take with a full glass of water on an empty stomach. 10/22/22 10/22/23  [name], MD  atorvastatin (LIPITOR) 40 MG tablet Take 1 tablet by mouth once daily 03/21/23   [name], MD  furosemide (LASIX) 40 MG tablet Take 1 tablet (40 mg total) by mouth daily. 05/13/23   [name], MD  metoprolol succinate (TOPROL-XL) 25 MG 24 hr tablet Take 1 tablet (25 mg total) by mouth daily. 05/13/23   [name], MD  oxyCODONE-acetaminophen (PERCOCET) 7.5-325 MG tablet Take 1 tablet by mouth every 8 (eight) hours as needed for severe  pain. 08/24/23   [name], MD  sacubitril-valsartan (ENTRESTO) 97-103 MG Take 1 tablet by mouth 2 (two) times daily. 05/13/23   [name], MD  sertraline (ZOLOFT) 50 MG tablet Take 1 tablet (50 mg total) by mouth daily. 10/22/22   [name], MD  spironolactone (ALDACTONE) 25 MG tablet Take 1 tablet (25 mg total) by mouth daily. 05/13/23   [name], MD  warfarin (COUMADIN) 4 MG tablet TAKE 1 TABLET BY MOUTH ONCE DAILY AT 4 PM 08/09/23   [name], MD      Allergies    Penicillins, Meperidine hcl, and Chantix [varenicline]    Review of Systems   Review of Systems  Respiratory:  Positive for shortness of breath.     Physical Exam Updated Vital Signs BP (!) 146/98   Pulse 79   Temp 97.6 F (36.4 C) (Oral)   Resp 17   LMP 01/13/1974   SpO2 96%  Physical Exam Vitals and nursing note reviewed.  Constitutional:      Appearance: She is well-developed.  HENT:     Head: Normocephalic and atraumatic.  Cardiovascular:     Rate and Rhythm: Normal rate and regular rhythm.  Pulmonary:     Effort: No respiratory distress.     Breath sounds: No stridor. Decreased breath sounds present. No wheezing.  Abdominal:  General: There is no distension.  Musculoskeletal:     Cervical back: Normal range of motion.     Right lower leg: No edema.     Left lower leg: Edema present.  Skin:    General: Skin is warm and dry.  Neurological:     Mental Status: She is alert.     ED Results / Procedures / Treatments   Labs (all labs ordered are listed, but only abnormal results are displayed) Labs Reviewed  BASIC METABOLIC PANEL - Abnormal; Notable for the following components:      Result Value   CO2 33 (*)    Glucose, Bld 154 (*)    Creatinine, Ser 1.18 (*)    GFR, Estimated 48 (*)    All other components within normal limits  CBC - Abnormal; Notable for the following components:   MCHC 29.9 (*)    All other components within normal limits  PROTIME-INR -  Abnormal; Notable for the following components:   Prothrombin Time 19.1 (*)    INR 1.6 (*)    All other components within normal limits  BRAIN NATRIURETIC PEPTIDE - Abnormal; Notable for the following components:   B Natriuretic Peptide 237.5 (*)    All other components within normal limits  SARS CORONAVIRUS 2 BY RT PCR  HEPATIC FUNCTION PANEL  TROPONIN I (HIGH SENSITIVITY)  TROPONIN I (HIGH SENSITIVITY)    EKG EKG Interpretation Date/Time:  Tuesday August 30 2023 22:19:16 EDT Ventricular Rate:  71 PR Interval:  112 QRS Duration:  86 QT Interval:  398 QTC Calculation: 432 R Axis:   73  Text Interpretation: Sinus rhythm with Premature atrial complexes Otherwise normal ECG When compared with ECG of 16-Aug-2023 07:36, PREVIOUS ECG IS PRESENT Confirmed by Marily Memos [phone]) on 08/30/2023 11:02:18 PM  Radiology CT Angio Chest PE W and/or Wo Contrast  Result Date: 08/31/2023 CLINICAL DATA:  Shortness of breath and central chest tightness. EXAM: CT ANGIOGRAPHY CHEST WITH CONTRAST TECHNIQUE: Multidetector CT imaging of the chest was performed using the standard protocol during bolus administration of intravenous contrast. Multiplanar CT image reconstructions and MIPs were obtained to evaluate the vascular anatomy. RADIATION DOSE REDUCTION: This exam was performed according to the departmental dose-optimization program which includes automated exposure control, adjustment of the mA and/or kV according to patient size and/or use of iterative reconstruction technique. CONTRAST:  75mL OMNIPAQUE IOHEXOL 350 MG/ML SOLN COMPARISON:  February 14, 2023 FINDINGS: Cardiovascular: There is mild to moderate severity calcification of the aortic arch, without evidence of aortic aneurysm. Satisfactory opacification of the pulmonary arteries to the segmental level. No evidence of pulmonary embolism. Normal heart size. No pericardial effusion. Mediastinum/Nodes: No enlarged mediastinal, hilar, or axillary lymph  nodes. Thyroid gland, trachea, and esophagus demonstrate no significant findings. Lungs/Pleura: There is mild centrilobular emphysematous lung disease. Mild areas of biapical and posterior left basilar scarring and/or atelectasis are noted. There is no evidence of acute infiltrate, pleural effusion or pneumothorax. Upper Abdomen: A 6 mm nonobstructing renal calculus is seen within the right kidney. Noninflamed diverticula are seen along the splenic flexure. Musculoskeletal: No chest wall abnormality. No acute or significant osseous findings. Review of the MIP images confirms the above findings. IMPRESSION: 1. No evidence of pulmonary embolism or acute cardiopulmonary disease. 2. Mild centrilobular emphysematous lung disease. 3. 6 mm nonobstructing right renal calculus. 4. Colonic diverticulosis. 5. Aortic atherosclerosis. Aortic Atherosclerosis (ICD10-I70.0) and Emphysema (ICD10-J43.9). Electronically Signed   By: Aram Candela M.D.   On: 08/31/2023 02:09  DG Chest 2 View  Result Date: 08/31/2023 CLINICAL DATA:  Shortness of breath, central chest tightness EXAM: CHEST - 2 VIEW COMPARISON:  08/16/2023 FINDINGS: There is hyperinflation of the lungs compatible with COPD. Heart and mediastinal contours are within normal limits. No focal opacities or effusions. No acute bony abnormality. IMPRESSION: COPD.  No active disease. Electronically Signed   By: Charlett Nose M.D.   On: 08/31/2023 00:14    Procedures Procedures    Medications Ordered in ED Medications  ipratropium-albuterol (DUONEB) 0.5-2.5 (3) MG/3ML nebulizer solution 3 mL (3 mLs Nebulization Given 08/30/23 2341)  iohexol (OMNIPAQUE) 350 MG/ML injection 75 mL (75 mLs Intravenous Contrast Given 08/31/23 0110)  methylPREDNISolone sodium succinate (SOLU-MEDROL) 125 mg/2 mL injection 125 mg (125 mg Intravenous Given 08/31/23 0249)  furosemide (LASIX) injection 20 mg (20 mg Intravenous Given 08/31/23 0249)    ED Course/ Medical Decision Making/  A&P                                 Medical Decision Making Amount and/or Complexity of Data Reviewed Labs: ordered. Radiology: ordered.  Risk Prescription drug management.   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  This was negative.  BNP was slightly elevated compared to her previous Lasix given with good output.  Breathing treatment given with good improvement.  Suspect she might need a LABA/ICS inhaler but will refer to pulmonology for same.  Her INR is subtherapeutic again so she will take a full capsule for the next 3 days and follow-up with the pharmacist for recheck.  Messaged her primary doctor for recheck of her kidney function and to ensure improvement symptoms are improving.  If her pulmonology evaluation is negative may need cardiology follow-up.  She was able to get back to her baseline breathing while days while she was here.  She ambulated quite a ways at a normal pace and she felt normal and her oxygen saturation was 95-96% the whole time.  Will do a longer course of steroids this time.  Stable for discharge.  Final Clinical Impression(s) / ED Diagnoses Final diagnoses:  Dyspnea, unspecified type  Subtherapeutic international normalized ratio (INR)  AKI (acute kidney injury) (HCC)  Elevated brain natriuretic peptide (BNP) level    Rx / DC Orders ED Discharge Orders          Ordered    predniSONE (DELTASONE) 20 MG tablet        08/31/23 0558    Ambulatory referral to Pulmonology        08/31/23 0601              Claryssa Sandner, Barbara Cower, MD 08/31/23 [phone]

## 2023-08-31 NOTE — Discharge Instructions (Addendum)
Our pharmacist here suggest taking a full Coumadin Wednesday, Thursday and Friday.  Half a tablet on Saturday and full again Sunday (continuing every other day as currently prescribed, unless told otherwise) pending your follow-up INR check on Monday.  They have also contacted your pharmacist that checks your levels and they may call you with different instructions, if so follow those.  Your kidney function was slightly diminished today.  This could be related to extra fluid as your BNP is also little bit elevated.  Double your Lasix for the next 2 days to see if that helps with your shortness of breath as well.  I will send a quick message to your primary doctors let them know of the findings today and my suggestions as above, if they disagree they may call you and ask you to do something else.  I would also ask that you follow-up with them Friday or Monday to make sure that you are doing better and to recheck some of your tests.  If anything does not seem to be improving like you expect or worsens then please return to the emergency room.

## 2023-08-31 NOTE — ED Notes (Signed)
O2 sat 95-96% on room air while ambulating

## 2023-08-31 NOTE — ED Notes (Signed)
Delay in discharge d/t pt waiting on daughter. Daughter finally answered the phone and is en route to take pt home.

## 2023-08-31 NOTE — TOC Benefit Eligibility Note (Signed)
Patient Product/process development scientist completed.    The patient is insured through [employer]. Patient has Medicare and is not eligible for a copay card, but may be able to apply for patient assistance, if available.    Ran test claim for Eliquis 5 mg and the current 30 day co-pay is $11.20.  Ran test claim for Xarelto 20 mg and the current 30 day co-pay is $11.20.   This test claim was processed through [facility]- copay amounts may vary at other pharmacies due to pharmacy/plan contracts, or as the patient moves through the different stages of their insurance plan.     Roland Earl, CPHT Pharmacy Technician III Certified Patient Advocate [facility] Pharm[facility] Patient Advocate Team Direct Number: [phone]  Fax: [phone]

## 2023-09-09 ENCOUNTER — Other Ambulatory Visit: Payer: Self-pay | Admitting: Internal Medicine

## 2023-09-09 NOTE — Telephone Encounter (Signed)
It looks like she was started on Eliquis and shouldn't be on the warfarin anymore.  Can you call the patient and ask for clarification?

## 2023-09-09 NOTE — Telephone Encounter (Signed)
Next appt scheduled 11/22 with PCP. LOV 10/7 with Dr Alexandria Lodge.

## 2023-09-26 ENCOUNTER — Other Ambulatory Visit: Payer: Self-pay | Admitting: Internal Medicine

## 2023-09-26 DIAGNOSIS — M545 Low back pain, unspecified: Secondary | ICD-10-CM

## 2023-09-26 NOTE — Telephone Encounter (Signed)
  oxyCODONE-acetaminophen (PERCOCET) 7.5-325 MG tablet   [facility] - [place] ([place]), [place] - [address]

## 2023-09-26 NOTE — Telephone Encounter (Signed)
Last rx written - 08/24/23. Last OV -06/03/23. Next OV - 10/07/23. TOX - 10/22/22.

## 2023-09-27 MED ORDER — OXYCODONE-ACETAMINOPHEN 7.5-325 MG PO TABS: 1.0000 | ORAL_TABLET | Freq: Three times a day (TID) | ORAL | 0 refills | Status: DC | PRN

## 2023-10-05 ENCOUNTER — Other Ambulatory Visit: Payer: Self-pay

## 2023-10-05 MED ORDER — APIXABAN 5 MG PO TABS: 5.0000 mg | ORAL_TABLET | Freq: Two times a day (BID) | ORAL | 0 refills | Status: DC

## 2023-10-06 ENCOUNTER — Encounter: Payer: Self-pay | Admitting: *Deleted

## 2023-10-07 ENCOUNTER — Encounter: Payer: Self-pay | Admitting: Internal Medicine

## 2023-10-07 ENCOUNTER — Other Ambulatory Visit: Payer: Self-pay

## 2023-10-07 ENCOUNTER — Ambulatory Visit (INDEPENDENT_AMBULATORY_CARE_PROVIDER_SITE_OTHER): Payer: Medicare HMO | Admitting: Internal Medicine

## 2023-10-07 VITALS — BP 104/60 | HR 63 | Temp 97.6°F | Ht 64.0 in | Wt 109.1 lb

## 2023-10-07 DIAGNOSIS — Z23 Encounter for immunization: Secondary | ICD-10-CM

## 2023-10-07 DIAGNOSIS — J449 Chronic obstructive pulmonary disease, unspecified: Secondary | ICD-10-CM | POA: Diagnosis not present

## 2023-10-07 DIAGNOSIS — R7989 Other specified abnormal findings of blood chemistry: Secondary | ICD-10-CM

## 2023-10-07 DIAGNOSIS — I110 Hypertensive heart disease with heart failure: Secondary | ICD-10-CM | POA: Diagnosis not present

## 2023-10-07 DIAGNOSIS — I5022 Chronic systolic (congestive) heart failure: Secondary | ICD-10-CM | POA: Diagnosis not present

## 2023-10-07 DIAGNOSIS — Z87891 Personal history of nicotine dependence: Secondary | ICD-10-CM

## 2023-10-07 DIAGNOSIS — I779 Disorder of arteries and arterioles, unspecified: Secondary | ICD-10-CM

## 2023-10-07 DIAGNOSIS — Z0000 Encounter for general adult medical examination without abnormal findings: Secondary | ICD-10-CM

## 2023-10-07 DIAGNOSIS — I10 Essential (primary) hypertension: Secondary | ICD-10-CM

## 2023-10-07 DIAGNOSIS — F1190 Opioid use, unspecified, uncomplicated: Secondary | ICD-10-CM

## 2023-10-07 DIAGNOSIS — J438 Other emphysema: Secondary | ICD-10-CM

## 2023-10-07 DIAGNOSIS — N200 Calculus of kidney: Secondary | ICD-10-CM

## 2023-10-07 MED ORDER — UMECLIDINIUM BROMIDE 62.5 MCG/ACT IN AEPB
1.0000 | INHALATION_SPRAY | Freq: Every day | RESPIRATORY_TRACT | 3 refills | Status: DC
Start: 2023-10-07 — End: 2023-12-07

## 2023-10-07 NOTE — Assessment & Plan Note (Signed)
She is currently on eliquis BID.  She is doing well.  She denies any signs of bleeding, including blood in stool or urine.   Plan Continue eliquis.

## 2023-10-07 NOTE — Assessment & Plan Note (Signed)
BP today is mildly low, improved on recheck.  She is on lasix daily, metoprolol, entresto and spironolactone for GDMT.  Continue therapy.  Monitor for symptoms.  She is asymptomatic today.   BMET today

## 2023-10-07 NOTE — Assessment & Plan Note (Signed)
She has no PFTs on record.  She has been in the ED twice now for exacerbation, received steroids.  Plan will be to order PFTs and start a LAMA (umeclidinium is preferred).  She is amenable to this plan.   Plan PFTs ordered Start LAMA daily

## 2023-10-07 NOTE — Assessment & Plan Note (Signed)
She is on good GDMT.  She does not have any swelling today or rales on lung exam.  She is euvolemic.  We will continue her current therapy.   Plan Continue metoprolol, entresto, spironolactone Continue lasix

## 2023-10-07 NOTE — Assessment & Plan Note (Signed)
Tdap today

## 2023-10-07 NOTE — Assessment & Plan Note (Signed)
PFTs and LAMA started today.   She had a CT of the lungs this year for rule out of PE.  Will need CT scan for lung cancer screening next year.

## 2023-10-07 NOTE — Assessment & Plan Note (Signed)
Seen on imaging.  Monitor.  Asymptomatic.

## 2023-10-07 NOTE — Patient Instructions (Addendum)
Kristin Knight - -  I would like you to get pulmonary function tests.  I have ordered these today and this will give [place] an idea of how well your lungs are working.   For your breathing, please take Umeclidinium inhaled daily.  This will help decrease the need for you to be seen in the ED.   Continue all of your medications as you have been.   Thank you!

## 2023-10-07 NOTE — Assessment & Plan Note (Signed)
She is on chronic opioids for back pain and OA.  She has well controlled symptoms.  She will require a pain management contract at next visit.  PDMP is appropriate.  Continue to refill.

## 2023-10-07 NOTE — Progress Notes (Signed)
Established Patient Office Visit  Subjective   Patient ID: Kristin Knight, female    DOB: 1945-12-29  Age: 77 y.o. MRN: [id number]  Chief Complaint  Patient presents with   Follow-up    4 months   Hypertension    Ms. 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   Ms. Ciaccia has chronic CHF and is on GDMT.  Today her BP was initially a little low.  She was asymptomatic.  She drank some water and on recheck her BP was improved.  She notes that she sometimes has some pain in her legs, but is otherwise doing well.  Since last being seen she was seen in the ED twice for emphysema and COPD exacerbations.  She has imaging findings of emphysema, but has not had PFTs.  She is only on albuterol.  She was placed on prednisone which she finished and she has no respiratory symptoms today. She is due for the Tdap and she will get this today.     Review of Systems  Constitutional:  Negative for chills, fever, malaise/fatigue and weight loss.  HENT:  Negative for hearing loss.   Eyes:  Negative for blurred vision.  Respiratory:  Negative for cough, hemoptysis and sputum production.   Cardiovascular:  Positive for claudication (occasional, at night). Negative for chest pain.  Musculoskeletal:  Negative for joint pain and myalgias.  Neurological:  Negative for dizziness, sensory change, weakness and headaches.      Objective:     BP 104/60 (BP Location: Left Arm, Cuff Size: Small)   Pulse 63   Temp 97.6 F (36.4 C) (Oral)   Ht 5\' 4"  (1.626 m)   Wt 109 lb 1.6 oz (49.5 kg)   LMP 01/13/1974   SpO2 98%   BMI 18.73 kg/m  BP Readings from Last 3 Encounters:  10/07/23 104/60  08/31/23 (!) 144/86  08/16/23 (!) 146/84   Wt Readings from Last 3 Encounters:  10/07/23 109 lb 1.6 oz (49.5 kg)  08/16/23 106 lb (48.1 kg)  06/22/23 106  lb (48.1 kg)      Physical Exam Vitals and nursing note reviewed.  Constitutional:      General: She is not in acute distress.    Appearance: Normal appearance. She is normal weight. She is not toxic-appearing.     Comments: Thin, elderly woman.  Slightly disconjugate gaze which is baseline for her.  No distress  HENT:     Head: Normocephalic and atraumatic.  Cardiovascular:     Rate and Rhythm: Regular rhythm.     Heart sounds: No murmur heard. Pulmonary:     Effort: Pulmonary effort is normal. No respiratory distress.     Breath sounds: Normal breath sounds. No wheezing.  Chest:     Chest wall: No tenderness.  Abdominal:     General: Abdomen is flat.     Palpations: Abdomen is soft.  Musculoskeletal:        General: No swelling.     Right lower leg: No edema.     Left lower leg: No edema.  Skin:    General: Skin is warm and dry.  Neurological:     Mental Status: She is alert and oriented to person, place, and time. Mental status is at baseline.  Psychiatric:        Mood and  Affect: Mood normal.        Behavior: Behavior normal.       Assessment & Plan:   Problem List Items Addressed This Visit       Unprioritized   Essential hypertension (Chronic)    BP today is mildly low, improved on recheck.  She is on lasix daily, metoprolol, entresto and spironolactone for GDMT.  Continue therapy.  Monitor for symptoms.  She is asymptomatic today.   BMET today      Peripheral arterial occlusive disease (HCC) (Chronic)    She is currently on eliquis BID.  She is doing well.  She denies any signs of bleeding, including blood in stool or urine.   Plan Continue eliquis.       History of tobacco abuse - Primary    PFTs and LAMA started today.   She had a CT of the lungs this year for rule out of PE.  Will need CT scan for lung cancer screening next year.       Relevant Medications   umeclidinium bromide (INCRUSE ELLIPTA) 62.5 MCG/ACT AEPB   Other Relevant Orders    Pulmonary function test   Healthcare maintenance    Tdap today.       Chronic obstructive pulmonary disease (HCC)    She has no PFTs on record.  She has been in the ED twice now for exacerbation, received steroids.  Plan will be to order PFTs and start a LAMA (umeclidinium is preferred).  She is amenable to this plan.   Plan PFTs ordered Start LAMA daily      Relevant Medications   umeclidinium bromide (INCRUSE ELLIPTA) 62.5 MCG/ACT AEPB   Other Relevant Orders   Pulmonary function test   Uncomplicated opioid use    She is on chronic opioids for back pain and OA.  She has well controlled symptoms.  She will require a pain management contract at next visit.  PDMP is appropriate.  Continue to refill.       Chronic systolic heart failure (HCC)    She is on good GDMT.  She does not have any swelling today or rales on lung exam.  She is euvolemic.  We will continue her current therapy.   Plan Continue metoprolol, entresto, spironolactone Continue lasix      Renal stone    Seen on imaging.  Monitor.  Asymptomatic.       Other Visit Diagnoses     Elevated serum creatinine       Relevant Orders   BMP8+Anion Gap   Immunization due       Relevant Orders   Tdap vaccine greater than or equal to 7yo IM (Completed)       Return in about 4 months (around 02/04/2024).    Debe Coder, MD

## 2023-10-08 LAB — BMP8+ANION GAP
Anion Gap: 17 mmol/L (ref 10.0–18.0)
BUN/Creatinine Ratio: 14 (ref 12–28)
BUN: 16 mg/dL (ref 8–27)
CO2: 27 mmol/L (ref 20–29)
Calcium: 9.1 mg/dL (ref 8.7–10.3)
Chloride: 93 mmol/L — ABNORMAL LOW (ref 96–106)
Creatinine, Ser: 1.11 mg/dL — ABNORMAL HIGH (ref 0.57–1.00)
Glucose: 122 mg/dL — ABNORMAL HIGH (ref 70–99)
Potassium: 4.4 mmol/L (ref 3.5–5.2)
Sodium: 137 mmol/L (ref 134–144)
eGFR: 52 mL/min/{1.73_m2} — ABNORMAL LOW (ref >59)

## 2023-10-12 ENCOUNTER — Encounter: Payer: Self-pay | Admitting: Internal Medicine

## 2023-10-26 ENCOUNTER — Other Ambulatory Visit: Payer: Self-pay

## 2023-10-26 DIAGNOSIS — M545 Low back pain, unspecified: Secondary | ICD-10-CM

## 2023-10-26 MED ORDER — OXYCODONE-ACETAMINOPHEN 7.5-325 MG PO TABS: 1.0000 | ORAL_TABLET | Freq: Three times a day (TID) | ORAL | 0 refills | Status: AC | PRN

## 2023-11-11 ENCOUNTER — Other Ambulatory Visit: Payer: Self-pay | Admitting: Internal Medicine

## 2023-11-14 NOTE — Telephone Encounter (Signed)
Medication sent to pharmacy  

## 2023-12-01 ENCOUNTER — Institutional Professional Consult (permissible substitution) (HOSPITAL_BASED_OUTPATIENT_CLINIC_OR_DEPARTMENT_OTHER): Payer: Medicare (Managed Care) | Admitting: Pulmonary Disease

## 2023-12-02 ENCOUNTER — Institutional Professional Consult (permissible substitution) (HOSPITAL_BASED_OUTPATIENT_CLINIC_OR_DEPARTMENT_OTHER): Payer: Medicare (Managed Care) | Admitting: Pulmonary Disease

## 2023-12-07 ENCOUNTER — Other Ambulatory Visit (HOSPITAL_COMMUNITY): Payer: Self-pay

## 2023-12-07 ENCOUNTER — Ambulatory Visit (HOSPITAL_BASED_OUTPATIENT_CLINIC_OR_DEPARTMENT_OTHER): Payer: Medicare (Managed Care) | Admitting: Pulmonary Disease

## 2023-12-07 ENCOUNTER — Encounter (HOSPITAL_BASED_OUTPATIENT_CLINIC_OR_DEPARTMENT_OTHER): Payer: Self-pay | Admitting: Pulmonary Disease

## 2023-12-07 VITALS — BP 94/62 | HR 62 | Resp 14 | Ht 64.0 in | Wt 119.0 lb

## 2023-12-07 DIAGNOSIS — Z720 Tobacco use: Secondary | ICD-10-CM | POA: Diagnosis not present

## 2023-12-07 DIAGNOSIS — J432 Centrilobular emphysema: Secondary | ICD-10-CM

## 2023-12-07 MED ORDER — ALBUTEROL SULFATE HFA 108 (90 BASE) MCG/ACT IN AERS: 2.0000 | INHALATION_SPRAY | Freq: Four times a day (QID) | RESPIRATORY_TRACT | 6 refills | Status: DC | PRN

## 2023-12-07 MED ORDER — ALBUTEROL SULFATE (2.5 MG/3ML) 0.083% IN NEBU: 2.5000 mg | INHALATION_SOLUTION | Freq: Four times a day (QID) | RESPIRATORY_TRACT | 1 refills | Status: AC | PRN

## 2023-12-07 MED ORDER — ANORO ELLIPTA 62.5-25 MCG/ACT IN AEPB: 1.0000 | INHALATION_SPRAY | Freq: Every day | RESPIRATORY_TRACT | 11 refills | Status: DC
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
## 2023-12-07 NOTE — Patient Instructions (Addendum)
  Emphysema --STOP Incruse --START Anoro ONE puff ONCE a day --START Albuterol AS NEEDED for shortness of breath or wheezing --Plan for pulmonary function tests at next visit  --ORDER nebulizer and albuterol nebs. Uses during severe symptoms

## 2023-12-10 ENCOUNTER — Other Ambulatory Visit: Payer: Self-pay | Admitting: Internal Medicine

## 2023-12-21 ENCOUNTER — Other Ambulatory Visit: Payer: Self-pay | Admitting: Internal Medicine

## 2023-12-21 DIAGNOSIS — F331 Major depressive disorder, recurrent, moderate: Secondary | ICD-10-CM

## 2023-12-21 NOTE — Telephone Encounter (Signed)
 Medication sent to pharmacy

## 2023-12-28 ENCOUNTER — Encounter (HOSPITAL_BASED_OUTPATIENT_CLINIC_OR_DEPARTMENT_OTHER): Payer: Self-pay | Admitting: Pulmonary Disease

## 2023-12-28 ENCOUNTER — Encounter (HOSPITAL_BASED_OUTPATIENT_CLINIC_OR_DEPARTMENT_OTHER): Payer: Medicare (Managed Care)

## 2024-01-31 ENCOUNTER — Encounter (HOSPITAL_BASED_OUTPATIENT_CLINIC_OR_DEPARTMENT_OTHER): Payer: Self-pay | Admitting: Pulmonary Disease

## 2024-01-31 ENCOUNTER — Ambulatory Visit (HOSPITAL_BASED_OUTPATIENT_CLINIC_OR_DEPARTMENT_OTHER): Payer: Medicare (Managed Care) | Admitting: Pulmonary Disease

## 2024-01-31 VITALS — BP 100/68 | HR 71 | Ht 64.0 in | Wt 132.6 lb

## 2024-01-31 DIAGNOSIS — J432 Centrilobular emphysema: Secondary | ICD-10-CM

## 2024-01-31 DIAGNOSIS — J439 Emphysema, unspecified: Secondary | ICD-10-CM | POA: Diagnosis not present

## 2024-01-31 NOTE — Patient Instructions (Signed)
 Emphysema - improved --CONTINUE Anoro ONE puff ONCE a day --CONTINUE Albuterol AS NEEDED for shortness of breath or wheezing --SCHEDULE pulmonary function tests at next visit --CONTINUE AS NEEDED nebulizer and albuterol nebs. Uses during severe symptoms
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
## 2024-03-20 ENCOUNTER — Other Ambulatory Visit: Payer: Self-pay | Admitting: Internal Medicine

## 2024-03-20 DIAGNOSIS — F331 Major depressive disorder, recurrent, moderate: Secondary | ICD-10-CM

## 2024-03-20 NOTE — Telephone Encounter (Signed)
 No longer [facility] patient.

## 2024-03-20 NOTE — Telephone Encounter (Signed)
 Yes, I called and spoke with the patient this morning to schedule a follow up appointment and she stated that she transferred her care to [facility].

## 2024-03-26 ENCOUNTER — Other Ambulatory Visit: Payer: Self-pay | Admitting: Internal Medicine

## 2024-03-26 NOTE — Telephone Encounter (Signed)
 NO LONGER [facility] PATIENT

## 2024-04-24 ENCOUNTER — Other Ambulatory Visit: Payer: Self-pay | Admitting: Internal Medicine

## 2024-04-24 NOTE — Telephone Encounter (Signed)
 NO LONGER [facility] PATIENT

## 2024-05-24 ENCOUNTER — Other Ambulatory Visit (HOSPITAL_BASED_OUTPATIENT_CLINIC_OR_DEPARTMENT_OTHER): Payer: Self-pay | Admitting: Registered Nurse

## 2024-05-24 DIAGNOSIS — Z780 Asymptomatic menopausal state: Secondary | ICD-10-CM

## 2024-05-27 ENCOUNTER — Other Ambulatory Visit: Payer: Self-pay | Admitting: Internal Medicine

## 2024-05-28 NOTE — Telephone Encounter (Signed)
 Looks like she was seen by Dr Leopold within the last year (was reassigned to Dr Karna but then also removed)  will forward to [facility] to see if she needs new PCP reassigned but go ahead and refill this one

## 2024-06-01 ENCOUNTER — Other Ambulatory Visit: Payer: Self-pay | Admitting: Internal Medicine

## 2024-06-06 ENCOUNTER — Other Ambulatory Visit: Payer: Self-pay | Admitting: Internal Medicine

## 2024-06-06 NOTE — Telephone Encounter (Unsigned)
 Copied from CRM [id number]. Topic: Clinical - Medication Refill >> Jun 06, 2024  3:46 PM Shamecia H wrote: Medication: apixaban  (ELIQUIS ) 5 MG TABS tablet  Has the patient contacted their pharmacy? Yes (Agent: If no, request that the patient contact the pharmacy for the refill. If patient does not wish to contact the pharmacy document the reason why and proceed with request.) (Agent: If yes, when and what did the pharmacy advise?)  This is the patient's preferred pharmacy:  [id number] - [place] ([place]), [place] - [address] [place] ([place]) [place] [address] Phone: [phone] Fax: [phone]  Is this the correct pharmacy for this prescription? Yes If no, delete pharmacy and type the correct one.   Has the prescription been filled recently? No  Is the patient out of the medication? Yes  Has the patient been seen for an appointment in the last year OR does the patient have an upcoming appointment? Yes  Can we respond through MyChart? Yes  Agent: Please be advised that Rx refills may take up to 3 business days. We ask that you follow-up with your pharmacy.

## 2024-06-14 ENCOUNTER — Ambulatory Visit (HOSPITAL_BASED_OUTPATIENT_CLINIC_OR_DEPARTMENT_OTHER)
Admission: RE | Admit: 2024-06-14 | Discharge: 2024-06-14 | Disposition: A | Discharge status: Home / Self Care | Payer: Medicare (Managed Care) | Source: Ambulatory Visit | Attending: Registered Nurse | Admitting: Registered Nurse

## 2024-06-14 DIAGNOSIS — Z780 Asymptomatic menopausal state: Secondary | ICD-10-CM | POA: Diagnosis present

## 2024-06-16 ENCOUNTER — Other Ambulatory Visit: Payer: Self-pay | Admitting: Internal Medicine

## 2024-06-18 NOTE — Telephone Encounter (Signed)
 No longer [facility] patient.

## 2024-07-12 ENCOUNTER — Other Ambulatory Visit (HOSPITAL_BASED_OUTPATIENT_CLINIC_OR_DEPARTMENT_OTHER): Payer: Self-pay | Admitting: Pulmonary Disease

## 2024-08-09 ENCOUNTER — Emergency Department (HOSPITAL_COMMUNITY): Payer: Medicare (Managed Care)

## 2024-08-09 ENCOUNTER — Encounter (HOSPITAL_COMMUNITY): Payer: Self-pay

## 2024-08-09 ENCOUNTER — Emergency Department (HOSPITAL_COMMUNITY)
Admission: EM | Admit: 2024-08-09 | Discharge: 2024-08-09 | Disposition: A | Discharge status: Home / Self Care | Payer: Medicare (Managed Care) | Attending: Emergency Medicine | Admitting: Emergency Medicine

## 2024-08-09 ENCOUNTER — Other Ambulatory Visit: Payer: Self-pay

## 2024-08-09 DIAGNOSIS — R0602 Shortness of breath: Secondary | ICD-10-CM | POA: Diagnosis present

## 2024-08-09 DIAGNOSIS — I509 Heart failure, unspecified: Secondary | ICD-10-CM | POA: Insufficient documentation

## 2024-08-09 DIAGNOSIS — D72819 Decreased white blood cell count, unspecified: Secondary | ICD-10-CM | POA: Diagnosis not present

## 2024-08-09 DIAGNOSIS — Z79899 Other long term (current) drug therapy: Secondary | ICD-10-CM | POA: Insufficient documentation

## 2024-08-09 DIAGNOSIS — Z7901 Long term (current) use of anticoagulants: Secondary | ICD-10-CM | POA: Diagnosis not present

## 2024-08-09 DIAGNOSIS — Z7951 Long term (current) use of inhaled steroids: Secondary | ICD-10-CM | POA: Insufficient documentation

## 2024-08-09 DIAGNOSIS — I110 Hypertensive heart disease with heart failure: Secondary | ICD-10-CM | POA: Diagnosis not present

## 2024-08-09 DIAGNOSIS — J441 Chronic obstructive pulmonary disease with (acute) exacerbation: Secondary | ICD-10-CM | POA: Insufficient documentation

## 2024-08-09 LAB — CBC WITH DIFFERENTIAL/PLATELET
Abs Immature Granulocytes: 0.01 K/uL (ref 0.00–0.07)
Basophils Absolute: 0 K/uL (ref 0.0–0.1)
Basophils Relative: 1 %
Eosinophils Absolute: 0.1 K/uL (ref 0.0–0.5)
Eosinophils Relative: 3 %
HCT: 37.2 % (ref 36.0–46.0)
Hemoglobin: 11.2 g/dL — ABNORMAL LOW (ref 12.0–15.0)
Immature Granulocytes: 0 %
Lymphocytes Relative: 31 %
Lymphs Abs: 1 K/uL (ref 0.7–4.0)
MCH: 28.4 pg (ref 26.0–34.0)
MCHC: 30.1 g/dL (ref 30.0–36.0)
MCV: 94.2 fL (ref 80.0–100.0)
Monocytes Absolute: 0.3 K/uL (ref 0.1–1.0)
Monocytes Relative: 10 %
Neutro Abs: 1.8 K/uL (ref 1.7–7.7)
Neutrophils Relative %: 55 %
Platelets: 188 K/uL (ref 150–400)
RBC: 3.95 MIL/uL (ref 3.87–5.11)
RDW: 14.2 % (ref 11.5–15.5)
WBC: 3.2 K/uL — ABNORMAL LOW (ref 4.0–10.5)
nRBC: 0 % (ref 0.0–0.2)

## 2024-08-09 LAB — RESP PANEL BY RT-PCR (RSV, FLU A&B, COVID)  RVPGX2
Influenza A by PCR: NEGATIVE
Influenza B by PCR: NEGATIVE
Resp Syncytial Virus by PCR: NEGATIVE
SARS Coronavirus 2 by RT PCR: NEGATIVE

## 2024-08-09 LAB — BASIC METABOLIC PANEL WITH GFR
Anion gap: 12 (ref 5–15)
BUN: 12 mg/dL (ref 8–23)
CO2: 26 mmol/L (ref 22–32)
Calcium: 9.5 mg/dL (ref 8.9–10.3)
Chloride: 104 mmol/L (ref 98–111)
Creatinine, Ser: 1.11 mg/dL — ABNORMAL HIGH (ref 0.44–1.00)
GFR, Estimated: 51 mL/min — ABNORMAL LOW (ref >60)
Glucose, Bld: 137 mg/dL — ABNORMAL HIGH (ref 70–99)
Potassium: 4.1 mmol/L (ref 3.5–5.1)
Sodium: 142 mmol/L (ref 135–145)

## 2024-08-09 MED ORDER — METHYLPREDNISOLONE SODIUM SUCC 125 MG IJ SOLR
125.0000 mg | Freq: Once | INTRAMUSCULAR | Status: AC
  Administered 2024-08-09: 125 mg via INTRAVENOUS
  Filled 2024-08-09: qty 2

## 2024-08-09 MED ORDER — PREDNISONE 20 MG PO TABS: 60.0000 mg | ORAL_TABLET | Freq: Every day | ORAL | 0 refills | Status: AC

## 2024-08-09 MED ORDER — ALBUTEROL SULFATE HFA 108 (90 BASE) MCG/ACT IN AERS
2.0000 | INHALATION_SPRAY | Freq: Once | RESPIRATORY_TRACT | Status: AC
  Administered 2024-08-09: 2 via RESPIRATORY_TRACT
  Filled 2024-08-09: qty 6.7

## 2024-08-09 MED ORDER — IPRATROPIUM-ALBUTEROL 0.5-2.5 (3) MG/3ML IN SOLN
3.0000 mL | Freq: Once | RESPIRATORY_TRACT | Status: AC
  Administered 2024-08-09: 3 mL via RESPIRATORY_TRACT
  Filled 2024-08-09: qty 3

## 2024-08-09 NOTE — Discharge Instructions (Signed)
 Your lab labs labs and chest x-ray were reassuring, no signs of fluid buildup or pneumonia.  COVID and flu testing were negative.  Suspect this is a COPD exacerbation.  Take steroids for the next 5 days starting tomorrow morning.  Use albuterol  inhaler every 4 hours for the next 24 hours and then as needed and continue using your daily inhalers as directed.  Follow-up closely with your primary care doctor in the next 4 days for recheck.  If you have worsening shortness of breath or difficulty breathing, chest pain, fevers or any other new or concerning symptoms return for reevaluation.

## 2024-08-09 NOTE — ED Notes (Signed)
 Ambulated and maintains 100% SP02 on roomair

## 2024-08-09 NOTE — ED Provider Notes (Signed)
 [place] EMERGENCY DEPARTMENT AT [facility] Provider Note   CSN: [id number] Arrival date & time: 08/09/24  9191     Patient presents with: Shortness of Breath   Kristin Knight is a 78 y.o. female.   Kristin Knight is a 78 y.o. female with history of COPD, heart failure, hypertension, tobacco abuse, who presents to the emergency department for evaluation of shortness of breath.  Symptoms have been present for about a week worsening over the past 2 days.  She reports that overnight she had significantly worsening shortness of breath and reports that she has been having to use her albuterol  inhaler every 2 hours and no longer feels like she is getting relief from this.  She reports shortness of breath is worse with exertion and she has had an occasional dry cough.  No fevers, no hemoptysis.  She denies any chest pain.  No lightheadedness or syncope.  She denies any lower extremity swelling.  She reports she has been compliant with all medications, is anticoagulated on Eliquis  and has not missed any does. No known sick contacts. Ran out of her albuterol  inhaler today.  The history is provided by the patient, medical records and a relative.  Shortness of Breath Associated symptoms: cough and wheezing   Associated symptoms: no abdominal pain, no chest pain and no fever        Prior to Admission medications   Medication Sig Start Date End Date Taking? Authorizing Provider  albuterol  (PROVENTIL ) (2.5 MG/3ML) 0.083% nebulizer solution Take 3 mLs (2.5 mg total) by nebulization every 6 (six) hours as needed for wheezing or shortness of breath. 12/07/23   Kassie Acquanetta Bradley, MD  albuterol  (VENTOLIN  HFA) 108 (90 Base) MCG/ACT inhaler INHALE 2 PUFFS BY MOUTH EVERY 6 HOURS AS NEEDED FOR WHEEZING FOR SHORTNESS OF BREATH 07/12/24   Kassie Acquanetta Bradley, MD  apixaban  (ELIQUIS ) 5 MG TABS tablet Take 1 tablet by mouth twice daily 12/12/23   Karna Fellows, MD  atorvastatin  (LIPITOR) 40 MG tablet Take 1  tablet by mouth once daily 03/21/23   Leopold Damien NOVAK, MD  furosemide  (LASIX ) 40 MG tablet Take 1 tablet by mouth once daily 05/28/24   Hoffman, Erik C, DO  metoprolol  succinate (TOPROL -XL) 25 MG 24 hr tablet Take 1 tablet (25 mg total) by mouth daily. 05/13/23   Leopold Damien NOVAK, MD  mirtazapine  (REMERON ) 15 MG tablet Take 15 mg by mouth at bedtime. 11/14/23   [provider]  oxyCODONE -acetaminophen  (PERCOCET) 7.5-325 MG tablet Take 1 tablet by mouth every 8 (eight) hours as needed for severe pain (pain score 7-10). 10/26/23   Karna Fellows, MD  sacubitril -valsartan  (ENTRESTO ) 97-103 MG Take 1 tablet by mouth 2 (two) times daily. 05/13/23   Leopold Damien NOVAK, MD  sertraline  (ZOLOFT ) 50 MG tablet Take 1 tablet by mouth once daily 12/21/23   Karna Fellows, MD  spironolactone  (ALDACTONE ) 25 MG tablet Take 1 tablet (25 mg total) by mouth daily. 05/13/23   Leopold Damien NOVAK, MD  umeclidinium-vilanterol (ANORO ELLIPTA ) 62.5-25 MCG/ACT AEPB Inhale 1 puff into the lungs daily. 12/07/23   Kassie Acquanetta Bradley, MD  Vitamin D , Ergocalciferol , (DRISDOL) 1.25 MG (50000 UNIT) CAPS capsule Take 50,000 Units by mouth once a week. 07/19/24   [provider]    Allergies: Penicillins, Meperidine hcl, and Chantix  [varenicline ]    Review of Systems  Constitutional:  Negative for chills and fever.  Respiratory:  Positive for cough, shortness of breath and wheezing. Negative for  chest tightness.   Cardiovascular:  Negative for chest pain, palpitations and leg swelling.  Gastrointestinal:  Negative for abdominal pain.  Neurological:  Negative for syncope and light-headedness.    Updated Vital Signs BP (!) 159/89   Pulse 74   Temp 97.8 F (36.6 C)   Resp 17   Ht 5' 4 (1.626 m)   Wt 52.6 kg   LMP 01/13/1974   SpO2 99%   BMI 19.91 kg/m   Physical Exam Vitals and nursing note reviewed.  Constitutional:      General: She is not in acute distress.    Appearance: Normal appearance. She is well-developed. She is  not diaphoretic.     Comments: Elderly female alert and in no acute distress  HENT:     Head: Normocephalic and atraumatic.  Eyes:     General:        Right eye: No discharge.        Left eye: No discharge.     Pupils: Pupils are equal, round, and reactive to light.  Cardiovascular:     Rate and Rhythm: Normal rate and regular rhythm.     Pulses: Normal pulses.     Heart sounds: Normal heart sounds.  Pulmonary:     Effort: Pulmonary effort is normal. No respiratory distress.     Breath sounds: Decreased breath sounds and wheezing present. No rales.     Comments: Respirations equal and unlabored, patient satting well on room air and able to speak in full sentences, on auscultation she does have some faint scattered expiratory wheezing and decreased air movement, no rales or rhonchi.  Occasional dry cough noted Chest:     Chest wall: No tenderness.  Abdominal:     General: Bowel sounds are normal. There is no distension.     Palpations: Abdomen is soft. There is no mass.     Tenderness: There is no abdominal tenderness. There is no guarding.     Comments: Abdomen soft, nondistended, nontender to palpation in all quadrants without guarding or peritoneal signs  Musculoskeletal:        General: No deformity.     Cervical back: Neck supple.     Right lower leg: No tenderness. No edema.     Left lower leg: No tenderness. No edema.  Skin:    General: Skin is warm and dry.     Capillary Refill: Capillary refill takes less than 2 seconds.  Neurological:     Mental Status: She is alert and oriented to person, place, and time.     Coordination: Coordination normal.     Comments: Speech is clear, able to follow commands Moves extremities without ataxia, coordination intact  Psychiatric:        Mood and Affect: Mood normal.        Behavior: Behavior normal.     (all labs ordered are listed, but only abnormal results are displayed) Labs Reviewed  BASIC METABOLIC PANEL WITH GFR -  Abnormal; Notable for the following components:      Result Value   Glucose, Bld 137 (*)    Creatinine, Ser 1.11 (*)    GFR, Estimated 51 (*)    All other components within normal limits  CBC WITH DIFFERENTIAL/PLATELET - Abnormal; Notable for the following components:   WBC 3.2 (*)    Hemoglobin 11.2 (*)    All other components within normal limits  RESP PANEL BY RT-PCR (RSV, FLU A&B, COVID)  [id number]    EKG: EKG Interpretation  Date/Time:  Thursday August 09 2024 09:05:34 EDT Ventricular Rate:  68 PR Interval:  140 QRS Duration:  94 QT Interval:  395 QTC Calculation: 421 R Axis:   67  Text Interpretation: Sinus rhythm when compared to prior, similar appearance No STEMI Confirmed by Ginger Barefoot ([id number]) on 08/09/2024 9:24:16 AM  Radiology: [facility] Chest 2 View Result Date: 08/09/2024 CLINICAL DATA:  SHOB EXAM: CHEST - 2 VIEW COMPARISON:  August 30, 2023 FINDINGS: Hyperexpanded lungs with flattening of the diaphragms. No focal airspace consolidation, pleural effusion, or pneumothorax. No cardiomegaly. Tortuous aorta with aortic atherosclerosis. No acute fracture or destructive lesions. Multilevel thoracic osteophytosis. IMPRESSION: Emphysema. No pneumonia or pulmonary edema. Electronically Signed   By: Rogelia Myers M.D.   On: 08/09/2024 10:51      Procedures   Medications Ordered in the ED  methylPREDNISolone  sodium succinate (SOLU-MEDROL ) 125 mg/2 mL injection 125 mg (125 mg Intravenous Given 08/09/24 0859)  ipratropium-albuterol  (DUONEB) 0.5-2.5 (3) MG/3ML nebulizer solution 3 mL (3 mLs Nebulization Given 08/09/24 0900)  albuterol  (VENTOLIN  HFA) 108 (90 Base) MCG/ACT inhaler 2 puff (2 puffs Inhalation Given 08/09/24 1116)                                    Medical Decision Making Amount and/or Complexity of Data Reviewed Labs: ordered. Radiology: ordered.  Risk Prescription drug management.   78 y.o. female presents to the ED with complaints of shortness of breath,  this involves an extensive number of treatment options, and is a complaint that carries with it a high risk of complications and morbidity.  The differential diagnosis includes COPD exacerbation, viral URI, pneumonia, asthma, CHF exacerbation, arrhythmia, ACS, PE  On arrival pt is nontoxic, vitals show HTN and pt did not take her morning BP meds, but otherwise normal. Exam significant for faint scattered wheezing  Additional history obtained from pt and family at bedside. Previous records obtained and reviewed   I ordered medication Prednisone  an duoneb for SOB  Lab Tests:  I Ordered, reviewed, and interpreted labs, which included: No leukocytosis, mild leukopenia, stable hemoglobin, no significant electrolyte derangements, renal function at baseline, covid flu and RSV testing negative  Imaging Studies ordered:  I ordered imaging studies which included chest x-ray, I independently visualized and interpreted imaging which showed emphysema, no pneumonia or pulmonary edema noted  ED Course:   After breathing treatment and steroids patient reports breathing feels back to baseline.  She is maintaining normal O2 sats without oxygen supplementation throughout her ED stay.  Ambulated in the department while maintaining normal O2 sats.  Will discharge home with albuterol  and 5-day burst of steroids.  Encourage close follow-up with PCP and strict return precautions.  Suspect mild COPD exacerbation.  Discharged home in good condition  Considered admission but given patients clinical improvement and reassuring workup feel patient is stable for discharge home.   Portions of this note were generated with Scientist, clinical (histocompatibility and immunogenetics). Dictation errors may occur despite best attempts at proofreading.      Final diagnoses:  SOB (shortness of breath)  COPD exacerbation [facility])    ED Discharge Orders          Ordered    predniSONE  (DELTASONE ) 20 MG tablet  Daily        08/09/24 1105                Alva Larraine FALCON, PA-C 08/14/24 1526  Tegeler, Lonni PARAS, MD 08/14/24 [phone]

## 2024-08-09 NOTE — ED Triage Notes (Signed)
 Pt here for increasing SHOB for a week. Denies CP. Pt states she used albuterol  2-3 times a day and it has not helped. Axox4. Pt is on eliquis . SHOB increased with exertion.

## 2024-08-14 NOTE — Progress Notes (Deleted)
 Cardiology Office Note:  .    Date:  08/14/2024  ID:  Kristin Knight, DOB Jan 21, 1946, MRN [id number] PCP: Freddrick Johns  [facility] Providers Cardiologist:  Stanly DELENA Leavens, MD { Click to update primary MD,subspecialty MD or APP then REFRESH:1}    CC: Re-establish care  History of Present Illness: .    Kristin Knight is a 78 y.o. female with a hx of Heart Failure Reduced Ejection Fraction , mild non obstructive CAD, In the setting of PAD on warfain and plavix , COPD NOS, and HTN.  Since last seen 10/21 was seen by AHF with [facility] not suggestive of severe obstructive disease.  Seen 10/19/21, in AHF clinic 2023 then lost to follow up.  Discussed the use of AI scribe software for clinical note transcription with the patient, who gave verbal consent to proceed.   Relevant histories: .  Social *** ROS: As per HPI.   Studies Reviewed: .     Cardiac Studies & Procedures   ______________________________________________________________________________________________ CARDIAC CATHETERIZATION  CARDIAC CATHETERIZATION 10/05/2021  Conclusion   Mid RCA lesion is 30% stenosed.   Mid Cx lesion is 30% stenosed.   2nd Mrg lesion is 30% stenosed.   2nd Diag lesion is 40% stenosed.  Nonobstructive mild CAD.  Slow flow down the relatively large LCx, suggesting possible microvascular disease in LCx territory.  Nonischemic cardiomyopathy.  Findings Coronary Findings Diagnostic  Dominance: Right  Left Anterior Descending  Second Diagonal Branch 2nd Diag lesion is 40% stenosed.  Left Circumflex Relatively large LCx (compared to LAD and RCA) with slow flow down the vessel. Mid Cx lesion is 30% stenosed.  Second Obtuse Marginal Branch 2nd Mrg lesion is 30% stenosed.  Right Coronary Artery Mid RCA lesion is 30% stenosed.  Intervention  No interventions have been documented.     ECHOCARDIOGRAM  ECHOCARDIOGRAM COMPLETE 01/21/2022  Narrative ECHOCARDIOGRAM  REPORT    Patient Name:   Kristin Knight Date of Exam: 01/21/2022 Medical Rec #:  [id number]       Height:       64.0 in Accession #:    [id number]      Weight:       120.8 lb Date of Birth:  Apr 30, 1946       BSA:          1.579 m Patient Age:    75 years        BP:           120/68 mmHg Patient Gender: F               HR:           64 bpm. Exam Location:  [address]  Procedure: 2D Echo, 3D Echo, Cardiac Doppler, Color Doppler and Strain Analysis  Indications:     I50.20 Heart failure with reduced EF  History:         Patient has prior history of Echocardiogram examinations, most recent 09/12/2021. CHF, COPD; Risk Factors:Former Smoker and Hypertension.  Sonographer:     Carl Coma RDCS Referring Phys:  [id number] [facility] DELENA LEAVENS Diagnosing Phys: Wilbert Bihari MD  IMPRESSIONS   1. Left ventricular ejection fraction, by estimation, is 55 to 60%. Left ventricular ejection fraction by 3D volume is 58 %. The left ventricle has normal function. The left ventricle has no regional wall motion abnormalities. Left ventricular diastolic parameters were normal. The average left ventricular global longitudinal strain is -19.1 %. The global longitudinal strain is normal. 2. Right  ventricular systolic function is normal. The right ventricular size is normal. 3. Left atrial size was mildly dilated. 4. The mitral valve is normal in structure. Trivial mitral valve regurgitation. No evidence of mitral stenosis. 5. The aortic valve is tricuspid. Aortic valve regurgitation is not visualized. Aortic valve sclerosis/calcification is present, without any evidence of aortic stenosis. 6. The inferior vena cava is normal in size with greater than 50% respiratory variability, suggesting right atrial pressure of 3 mmHg. 7. Compared to prior echo, EF has significantly improved.  FINDINGS Left Ventricle: Left ventricular ejection fraction, by estimation, is 55 to 60%. Left ventricular ejection  fraction by 3D volume is 58 %. The left ventricle has normal function. The left ventricle has no regional wall motion abnormalities. The average left ventricular global longitudinal strain is -19.1 %. The global longitudinal strain is normal. The left ventricular internal cavity size was normal in size. There is no left ventricular hypertrophy. Left ventricular diastolic parameters were normal. Normal left ventricular filling pressure.  Right Ventricle: The right ventricular size is normal. No increase in right ventricular wall thickness. Right ventricular systolic function is normal.  Left Atrium: Left atrial size was mildly dilated.  Right Atrium: Right atrial size was normal in size.  Pericardium: There is no evidence of pericardial effusion.  Mitral Valve: The mitral valve is normal in structure. Trivial mitral valve regurgitation. No evidence of mitral valve stenosis.  Tricuspid Valve: The tricuspid valve is normal in structure. Tricuspid valve regurgitation is trivial. No evidence of tricuspid stenosis.  Aortic Valve: The aortic valve is tricuspid. Aortic valve regurgitation is not visualized. Aortic valve sclerosis/calcification is present, without any evidence of aortic stenosis.  Pulmonic Valve: The pulmonic valve was normal in structure. Pulmonic valve regurgitation is trivial. No evidence of pulmonic stenosis.  Aorta: The aortic root is normal in size and structure.  Venous: The inferior vena cava is normal in size with greater than 50% respiratory variability, suggesting right atrial pressure of 3 mmHg.  IAS/Shunts: No atrial level shunt detected by color flow Doppler.   LEFT VENTRICLE PLAX 2D LVIDd:         4.90 cm         Diastology LVIDs:         3.10 cm         LV e' medial:    10.80 cm/s LV PW:         0.80 cm         LV E/e' medial:  6.6 LV IVS:        0.80 cm         LV e' lateral:   12.20 cm/s LVOT diam:     2.00 cm         LV E/e' lateral: 5.9 LV SV:          58 LV SV Index:   37              2D LVOT Area:     3.14 cm        Longitudinal Strain 2D Strain GLS  -19.0 % (A2C): 2D Strain GLS  -19.0 % (A3C): 2D Strain GLS  -19.4 % (A4C): 2D Strain GLS  -19.1 % Avg:  3D Volume EF LV 3D EF:    Left ventricul ar ejection fraction by 3D volume is 58 %.  3D Volume EF: 3D EF:        58 % LV EDV:       106 ml LV  ESV:       45 ml LV SV:        61 ml  RIGHT VENTRICLE             IVC RV Basal diam:  3.60 cm     IVC diam: 1.10 cm RV S prime:     14.00 cm/s TAPSE (M-mode): 2.5 cm  LEFT ATRIUM           Index        RIGHT ATRIUM          Index LA diam:      4.10 cm 2.60 cm/m   RA Area:     9.78 cm LA Vol (A2C): 56.2 ml 35.59 ml/m  RA Volume:   20.90 ml 13.24 ml/m LA Vol (A4C): 32.6 ml 20.65 ml/m AORTIC VALVE LVOT Vmax:   102.50 cm/s LVOT Vmean:  62.550 cm/s LVOT VTI:    0.186 m  AORTA Ao Root diam: 3.10 cm  MITRAL VALVE               TRICUSPID VALVE MV Area (PHT): 3.72 cm    TR Peak grad:   28.5 mmHg MV Decel Time: 204 msec    TR Vmax:        267.00 cm/s MV E velocity: 71.80 cm/s MV A velocity: 54.00 cm/s  SHUNTS MV E/A ratio:  1.33        Systemic VTI:  0.19 m Systemic Diam: 2.00 cm  Wilbert Bihari MD Electronically signed by Wilbert Bihari MD Signature Date/Time: 01/21/2022/6:09:31 PM    Final (Updated)        CARDIAC MRI  MR CARDIAC MORPHOLOGY W WO CONTRAST 12/25/2021  Narrative CLINICAL DATA:  Cardiomyopathy of uncertain etiology  EXAM: CARDIAC MRI  TECHNIQUE: The patient was scanned on a 1.5 Tesla GE magnet. A dedicated cardiac coil was used. Functional imaging was done using Fiesta sequences. 2,3, and 4 chamber views were done to assess for RWMA's. Modified Simpson's rule using a short axis stack was used to calculate an ejection fraction on a dedicated work [employer]. The patient received 7 cc of Gadavist . After 10 minutes inversion recovery sequences were used to assess  for infiltration and scar tissue.  CONTRAST:  Gadavist  7 cc  FINDINGS: Limited images of the lung fields showed no gross abnormalities.  Normal left ventricular size and wall thickness. Normal wall motion with EF 55%. Normal right ventricular size and systolic function, EF 56%. Mild left atrial enlargement. Normal right atrium. No significant mitral regurgitation. Mild to moderate tricuspid regurgitation. Trileaflet aortic valve, no significant stenosis or regurgitation.  On delayed enhancement imaging, there was no myocardial late gadolinium enhancement (LGE).  MEASUREMENTS: MEASUREMENTS LVEDV 115 mL  LVSV 64 mL  LVEF 55%  RVEDV 108 mL RVSV 61 mL RVEF 56%  IMPRESSION: 1.  Normal LV size and systolic function, LV EF 55%.  2.  Normal RV size and systolic function, RV EF 56%.  3. No myocardial LGE, so no definitive evidence for prior MI, infiltrative disease, or myocarditis.  Kristin Knight   Electronically Signed By: Ezra Shuck M.D. On: 12/25/2021 13:51   ______________________________________________________________________________________________        Risk Assessment/Calculations:    {Does this patient have ATRIAL FIBRILLATION?:[id number]}       Physical Exam:    VS:  LMP 01/13/1974    Wt Readings from Last 3 Encounters:  08/09/24 116 lb (52.6 kg)  01/31/24 132 lb 9.6 oz (60.1 kg)  12/07/23 119 lb (54 kg)    Gen: *** distress, *** obese/well nourished/malnourished   Neck: No JVD, *** carotid bruit Ears: PIERRETTE Lerner Sign Cardiac: No Rubs or Gallops, *** Murmur, ***cardia, *** radial pulses Respiratory: Clear to auscultation bilaterally, *** effort, ***  respiratory rate GI: Soft, nontender, non-distended *** MS: No *** edema; *** moves all extremities Integument: Skin feels *** Neuro:  At time of evaluation, alert and oriented to person/place/time/situation *** Psych: Normal affect, patient feels ***   ASSESSMENT AND PLAN: .     *** An EKG was ordered for *** and shows ***  Stanly Leavens, MD FASE [facility] Cardiologist [facility]  [address] [place], [address] [place], [place] [address] [phone]  2:41 PM

## 2024-08-15 ENCOUNTER — Ambulatory Visit: Payer: Medicare (Managed Care) | Attending: Internal Medicine | Admitting: Internal Medicine

## 2024-08-31 ENCOUNTER — Other Ambulatory Visit: Payer: Self-pay | Admitting: Internal Medicine

## 2024-10-22 ENCOUNTER — Ambulatory Visit: Payer: Medicare (Managed Care) | Admitting: Internal Medicine

## 2024-10-22 ENCOUNTER — Ambulatory Visit: Payer: Medicare (Managed Care) | Admitting: Physician Assistant

## 2024-10-22 NOTE — Progress Notes (Deleted)
 Cardiology Office Note:  .    Date:  10/22/2024  ID:  Rogers JONELLE Ned, DOB 10-18-1946, MRN [id number] PCP: Freddrick, No  [facility] Providers Cardiologist:  Stanly DELENA Leavens, MD { Click to update primary MD,subspecialty MD or APP then REFRESH:1}    CC: *** Consulted for the evaluation of *** at the behest of ***   History of Present Illness: .    CABRINA SHIROMA is a 78 y.o. female ***  Discussed the use of AI scribe software for clinical note transcription with the patient, who gave verbal consent to proceed.   Relevant histories: .  Social *** ROS: As per HPI.   Studies Reviewed: .     Cardiac Studies & Procedures   ______________________________________________________________________________________________ CARDIAC CATHETERIZATION  CARDIAC CATHETERIZATION 10/05/2021  Conclusion   Mid RCA lesion is 30% stenosed.   Mid Cx lesion is 30% stenosed.   2nd Mrg lesion is 30% stenosed.   2nd Diag lesion is 40% stenosed.  Nonobstructive mild CAD.  Slow flow down the relatively large LCx, suggesting possible microvascular disease in LCx territory.  Nonischemic cardiomyopathy.  Findings Coronary Findings Diagnostic  Dominance: Right  Left Anterior Descending  Second Diagonal Branch 2nd Diag lesion is 40% stenosed.  Left Circumflex Relatively large LCx (compared to LAD and RCA) with slow flow down the vessel. Mid Cx lesion is 30% stenosed.  Second Obtuse Marginal Branch 2nd Mrg lesion is 30% stenosed.  Right Coronary Artery Mid RCA lesion is 30% stenosed.  Intervention  No interventions have been documented.     ECHOCARDIOGRAM  ECHOCARDIOGRAM COMPLETE 01/21/2022  Narrative ECHOCARDIOGRAM REPORT    Patient Name:   SHERICKA JOHNSTONE Date of Exam: 01/21/2022 Medical Rec #:  [id number]       Height:       64.0 in Accession #:    [id number]      Weight:       120.8 lb Date of Birth:  11-05-1946       BSA:          1.579 m Patient Age:    75  years        BP:           120/68 mmHg Patient Gender: F               HR:           64 bpm. Exam Location:  [address]  Procedure: 2D Echo, 3D Echo, Cardiac Doppler, Color Doppler and Strain Analysis  Indications:     I50.20 Heart failure with reduced EF  History:         Patient has prior history of Echocardiogram examinations, most recent 09/12/2021. CHF, COPD; Risk Factors:Former Smoker and Hypertension.  Sonographer:     Carl Coma RDCS Referring Phys:  [id number] [facility] DELENA LEAVENS Diagnosing Phys: Wilbert Bihari MD  IMPRESSIONS   1. Left ventricular ejection fraction, by estimation, is 55 to 60%. Left ventricular ejection fraction by 3D volume is 58 %. The left ventricle has normal function. The left ventricle has no regional wall motion abnormalities. Left ventricular diastolic parameters were normal. The average left ventricular global longitudinal strain is -19.1 %. The global longitudinal strain is normal. 2. Right ventricular systolic function is normal. The right ventricular size is normal. 3. Left atrial size was mildly dilated. 4. The mitral valve is normal in structure. Trivial mitral valve regurgitation. No evidence of mitral stenosis. 5. The aortic valve is tricuspid. Aortic valve regurgitation  is not visualized. Aortic valve sclerosis/calcification is present, without any evidence of aortic stenosis. 6. The inferior vena cava is normal in size with greater than 50% respiratory variability, suggesting right atrial pressure of 3 mmHg. 7. Compared to prior echo, EF has significantly improved.  FINDINGS Left Ventricle: Left ventricular ejection fraction, by estimation, is 55 to 60%. Left ventricular ejection fraction by 3D volume is 58 %. The left ventricle has normal function. The left ventricle has no regional wall motion abnormalities. The average left ventricular global longitudinal strain is -19.1 %. The global longitudinal strain is normal. The left  ventricular internal cavity size was normal in size. There is no left ventricular hypertrophy. Left ventricular diastolic parameters were normal. Normal left ventricular filling pressure.  Right Ventricle: The right ventricular size is normal. No increase in right ventricular wall thickness. Right ventricular systolic function is normal.  Left Atrium: Left atrial size was mildly dilated.  Right Atrium: Right atrial size was normal in size.  Pericardium: There is no evidence of pericardial effusion.  Mitral Valve: The mitral valve is normal in structure. Trivial mitral valve regurgitation. No evidence of mitral valve stenosis.  Tricuspid Valve: The tricuspid valve is normal in structure. Tricuspid valve regurgitation is trivial. No evidence of tricuspid stenosis.  Aortic Valve: The aortic valve is tricuspid. Aortic valve regurgitation is not visualized. Aortic valve sclerosis/calcification is present, without any evidence of aortic stenosis.  Pulmonic Valve: The pulmonic valve was normal in structure. Pulmonic valve regurgitation is trivial. No evidence of pulmonic stenosis.  Aorta: The aortic root is normal in size and structure.  Venous: The inferior vena cava is normal in size with greater than 50% respiratory variability, suggesting right atrial pressure of 3 mmHg.  IAS/Shunts: No atrial level shunt detected by color flow Doppler.   LEFT VENTRICLE PLAX 2D LVIDd:         4.90 cm         Diastology LVIDs:         3.10 cm         LV e' medial:    10.80 cm/s LV PW:         0.80 cm         LV E/e' medial:  6.6 LV IVS:        0.80 cm         LV e' lateral:   12.20 cm/s LVOT diam:     2.00 cm         LV E/e' lateral: 5.9 LV SV:         58 LV SV Index:   37              2D LVOT Area:     3.14 cm        Longitudinal Strain 2D Strain GLS  -19.0 % (A2C): 2D Strain GLS  -19.0 % (A3C): 2D Strain GLS  -19.4 % (A4C): 2D Strain GLS  -19.1 % Avg:  3D Volume EF LV 3D EF:     Left ventricul ar ejection fraction by 3D volume is 58 %.  3D Volume EF: 3D EF:        58 % LV EDV:       106 ml LV ESV:       45 ml LV SV:        61 ml  RIGHT VENTRICLE             IVC RV Basal diam:  3.60 cm  IVC diam: 1.10 cm RV S prime:     14.00 cm/s TAPSE (M-mode): 2.5 cm  LEFT ATRIUM           Index        RIGHT ATRIUM          Index LA diam:      4.10 cm 2.60 cm/m   RA Area:     9.78 cm LA Vol (A2C): 56.2 ml 35.59 ml/m  RA Volume:   20.90 ml 13.24 ml/m LA Vol (A4C): 32.6 ml 20.65 ml/m AORTIC VALVE LVOT Vmax:   102.50 cm/s LVOT Vmean:  62.550 cm/s LVOT VTI:    0.186 m  AORTA Ao Root diam: 3.10 cm  MITRAL VALVE               TRICUSPID VALVE MV Area (PHT): 3.72 cm    TR Peak grad:   28.5 mmHg MV Decel Time: 204 msec    TR Vmax:        267.00 cm/s MV E velocity: 71.80 cm/s MV A velocity: 54.00 cm/s  SHUNTS MV E/A ratio:  1.33        Systemic VTI:  0.19 m Systemic Diam: 2.00 cm  Wilbert Bihari MD Electronically signed by Wilbert Bihari MD Signature Date/Time: 01/21/2022/6:09:31 PM    Final (Updated)        CARDIAC MRI  MR CARDIAC MORPHOLOGY W WO CONTRAST 12/25/2021  Narrative CLINICAL DATA:  Cardiomyopathy of uncertain etiology  EXAM: CARDIAC MRI  TECHNIQUE: The patient was scanned on a 1.5 Tesla GE magnet. A dedicated cardiac coil was used. Functional imaging was done using Fiesta sequences. 2,3, and 4 chamber views were done to assess for RWMA's. Modified Simpson's rule using a short axis stack was used to calculate an ejection fraction on a dedicated work [employer]. The patient received 7 cc of Gadavist . After 10 minutes inversion recovery sequences were used to assess for infiltration and scar tissue.  CONTRAST:  Gadavist  7 cc  FINDINGS: Limited images of the lung fields showed no gross abnormalities.  Normal left ventricular size and wall thickness. Normal wall motion with EF 55%. Normal right ventricular size  and systolic function, EF 56%. Mild left atrial enlargement. Normal right atrium. No significant mitral regurgitation. Mild to moderate tricuspid regurgitation. Trileaflet aortic valve, no significant stenosis or regurgitation.  On delayed enhancement imaging, there was no myocardial late gadolinium enhancement (LGE).  MEASUREMENTS: MEASUREMENTS LVEDV 115 mL  LVSV 64 mL  LVEF 55%  RVEDV 108 mL RVSV 61 mL RVEF 56%  IMPRESSION: 1.  Normal LV size and systolic function, LV EF 55%.  2.  Normal RV size and systolic function, RV EF 56%.  3. No myocardial LGE, so no definitive evidence for prior MI, infiltrative disease, or myocarditis.  Dalton Mclean   Electronically Signed By: Ezra Shuck M.D. On: 12/25/2021 13:51   ______________________________________________________________________________________________        Risk Assessment/Calculations:    {Does this patient have ATRIAL FIBRILLATION?:[phone]}       Physical Exam:    VS:  LMP 01/13/1974    Wt Readings from Last 3 Encounters:  08/09/24 116 lb (52.6 kg)  01/31/24 132 lb 9.6 oz (60.1 kg)  12/07/23 119 lb (54 kg)    Gen: *** distress, *** obese/well nourished/malnourished   Neck: No JVD, *** carotid bruit Ears: PIERRETTE Lerner Sign Cardiac: No Rubs or Gallops, *** Murmur, ***cardia, *** radial pulses Respiratory: Clear to auscultation bilaterally, *** effort, ***  respiratory rate GI: Soft, nontender, non-distended *** MS: No *** edema; *** moves all extremities Integument: Skin feels *** Neuro:  At time of evaluation, alert and oriented to person/place/time/situation *** Psych: Normal affect, patient feels ***  No BP recorded.  {Refresh Note OR Click here to enter BP  :1}***    ASSESSMENT AND PLAN: .    *** An EKG was ordered for *** and shows ***  1.  Chronic systolic CHF: Nonischemic cardiomyopathy.  Echo 08/2021 with EF 25-30%. LHC in 11/22 showed nonobstructive CAD. Cause uncertain,  possibly due to viral myocarditis.  Patient has family history of CAD/MIs but not dilated cardiomyopathy. She has been out of Lasix  for 1 week. On exam today, she is mildly volume overloaded.  NYHA class II-III symptoms.  - Restart Lasix  40 mg daily.  - Continue empagliflozin  10 mg daily.  - Continue Toprol  Xl 25 mg daily.  No titration with HR in 50s.   - Increase Entresto  to 97/103 bid.  BMET today and in 10 days.  - Cardiac MRI to look for infiltrative disease/myocarditis.  - Repeat echo at 3 month.  If EF remains low, she would be an ICD candidate.  QRS probably not wide enough to benefit from CRT.  2. PAD: Former smoker.  H/o left fem-pop bypass.  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  - Followed by Dr Serene - Continue warfarin. CBC today.  3. HTN: Elevated today, increasing Entresto .  4. Hyperlipidemia: Check lipids today, goal LDL < 70 with PAD.   Stanly Leavens, MD FASE [facility] Cardiologist [facility]  [address] [place], [address] [place], [place] [address] [phone]  8:15 AM

## 2024-11-21 ENCOUNTER — Other Ambulatory Visit: Payer: Self-pay | Admitting: Emergency Medicine

## 2024-11-21 ENCOUNTER — Ambulatory Visit: Payer: Medicare (Managed Care) | Admitting: Emergency Medicine

## 2024-11-21 ENCOUNTER — Encounter: Payer: Self-pay | Admitting: Emergency Medicine

## 2024-11-21 VITALS — BP 118/66 | HR 56 | Ht 64.0 in | Wt 119.0 lb

## 2024-11-21 DIAGNOSIS — I5020 Unspecified systolic (congestive) heart failure: Secondary | ICD-10-CM | POA: Diagnosis not present

## 2024-11-21 DIAGNOSIS — I779 Disorder of arteries and arterioles, unspecified: Secondary | ICD-10-CM

## 2024-11-21 DIAGNOSIS — I2510 Atherosclerotic heart disease of native coronary artery without angina pectoris: Secondary | ICD-10-CM | POA: Diagnosis not present

## 2024-11-21 DIAGNOSIS — I10 Essential (primary) hypertension: Secondary | ICD-10-CM | POA: Diagnosis not present

## 2024-11-21 DIAGNOSIS — Z79899 Other long term (current) drug therapy: Secondary | ICD-10-CM

## 2024-11-21 DIAGNOSIS — E785 Hyperlipidemia, unspecified: Secondary | ICD-10-CM

## 2024-11-21 MED ORDER — SPIRONOLACTONE 25 MG PO TABS: 12.5000 mg | ORAL_TABLET | Freq: Every day | ORAL | 1 refills | Status: AC

## 2024-11-21 MED ORDER — SACUBITRIL-VALSARTAN 24-26 MG PO TABS: 1.0000 | ORAL_TABLET | Freq: Two times a day (BID) | ORAL | 1 refills | Status: DC

## 2024-11-21 NOTE — Patient Instructions (Addendum)
 Medication Instructions:  DECREASE YOUR SPIRONOLACTONE  TO 12.5 MG (1/2 TABLET) DAILY. START ENTRESTO  24/26 MG TWICE DAILY.  Lab Work: BMET, FASTING LIPID PANEL,  AND CBC TO BE DONE TODAY.  Testing/Procedures: NONE  Follow-Up: At [facility], you and your health needs are our priority.  As part of our continuing mission to provide you with exceptional heart care, our providers are all part of one team.  This team includes your primary Cardiologist (physician) and Advanced Practice Providers or APPs (Physician Assistants and Nurse Practitioners) who all work together to provide you with the care you need, when you need it.  Your next appointment:   3 MONTHS  Provider:   Stanly DELENA Leavens, MD

## 2024-11-21 NOTE — Progress Notes (Signed)
 " Cardiology Office Note:    Date:  11/21/2024  ID:  Kristin Knight, DOB 05/19/1946, MRN [id number] PCP: Pcp, No  [facility] Providers Cardiologist:  Stanly DELENA Leavens, MD       Patient Profile:       Chief Complaint: Follow-up for heart failure History of Present Illness:  Kristin Knight is a 79 y.o. female with visit-pertinent history of hypertension, peripheral arterial disease, COPD, former heavy smoker, systolic heart failure  History of PAD s/p left femoral to AK popliteal bypass with PTFE in 2012 and subsequent occlusion of bypass in 2013 and 2019 on chronic Coumadin  for graft patency.  Admitted 08/2021 with increased shortness of breath in the setting of new acute HFrEF.  Echocardiogram showed LVEF 25 to 30%, diffuse hypokinesis, grade 1 DD.  She was diuresed with IV Lasix  and placed on GDMT.  Did not get cardiac catheterization.  Notes outlined intent to contact ischemic workup as an outpatient after she recovered from acute exacerbation.  She was referred to heart failure clinic.  Cardiac catheterization 09/2021 showed nonobstructive mild CAD and nonischemic cardiomyopathy.  Last seen by heart failure clinic with Dr. Rolan on 11/30/2021.  Cause of her nonischemic cardiomyopathy was uncertain, possibly due to viral myocarditis.  She was restarted on furosemide  and continued on Jardiance , metoprolol  succinate and Entresto  was increased to 97-103 twice daily.  Cardiac MRI was ordered to look for infiltrative disease/myocarditis.  Cardiac MRI 12/2021 showed normal LV size and systolic function with LVEF 55% and normal RV size and systolic function with RVEF 56% and no myocardial LGE send no definitive evidence for prior MI, infiltrative disease, or myocarditis.  Echocardiogram 01/2022 showed improved LVEF of 55 to 60%, no RWMA, normal diastolic parameters, RV function and size normal, left atrial size mildly dilated, trivial MR.   Discussed the use of AI scribe software for  clinical note transcription with the patient, who gave verbal consent to proceed.  History of Present Illness Kristin Knight is a 79 year old female with history of heart failure who presents for medication management and follow-up.   Today patient tells me she is doing well without acute cardiovascular concerns or complaints.  She has not been seen in clinic in 2 years but she has been out of her Entresto  over the past 3 months and her primary care provider wanted her to see cardiology.  She continues spironolactone , metoprolol , and Lasix .. She was switched from warfarin to Eliquis  last summer. She stopped Jardiance  over a year ago but is not sure why. She does not check blood pressure at home and has no dizziness other than brief lightheadedness when standing quickly.  She has COPD treated with an inhaler as needed. She has no current shortness of breath or chest pain.   She has swelling in the left leg after daytime activity that resolves overnight.  She is active with daily housecleaning but does not do a structured exercise program.  She is without chest pains, dyspnea, orthopnea, PND, syncope, presyncope, palpitations    Review of systems:  Please see the history of present illness. All other systems are reviewed and otherwise negative.      Studies Reviewed:        Echocardiogram 01/21/2022  1. Left ventricular ejection fraction, by estimation, is 55 to 60%. Left  ventricular ejection fraction by 3D volume is 58 %. The left ventricle has  normal function. The left ventricle has no regional wall motion  abnormalities. Left  ventricular diastolic   parameters were normal. The average left ventricular global longitudinal  strain is -19.1 %. The global longitudinal strain is normal.   2. Right ventricular systolic function is normal. The right ventricular  size is normal.   3. Left atrial size was mildly dilated.   4. The mitral valve is normal in structure. Trivial mitral valve   regurgitation. No evidence of mitral stenosis.   5. The aortic valve is tricuspid. Aortic valve regurgitation is not  visualized. Aortic valve sclerosis/calcification is present, without any  evidence of aortic stenosis.   6. The inferior vena cava is normal in size with greater than 50%  respiratory variability, suggesting right atrial pressure of 3 mmHg.   7. Compared to prior echo, EF has significantly improved.   Cardiac MRI 12/25/2021 1.  Normal LV size and systolic function, LV EF 55%.   2.  Normal RV size and systolic function, RV EF 56%.   3. No myocardial LGE, so no definitive evidence for prior MI, infiltrative disease, or myocarditis.  Cardiac catheterization 10/05/2021   Mid RCA lesion is 30% stenosed.   Mid Cx lesion is 30% stenosed.   2nd Mrg lesion is 30% stenosed.   2nd Diag lesion is 40% stenosed.   Nonobstructive mild CAD.  Slow flow down the relatively large LCx, suggesting possible microvascular disease in LCx territory.     Nonischemic cardiomyopathy.  Diagnostic Dominance: Right  Echocardiogram 09/12/2021   1. Left ventricular ejection fraction, by estimation, is 25 to 30%. The  left ventricle has severely decreased function. The left ventricle  demonstrates global hypokinesis. There is mild left ventricular  hypertrophy. Left ventricular diastolic parameters   are consistent with Grade I diastolic dysfunction (impaired relaxation).   2. Right ventricular systolic function is normal. The right ventricular  size is normal. Tricuspid regurgitation signal is inadequate for assessing  PA pressure.   3. The mitral valve is normal in structure. Trivial mitral valve  regurgitation. No evidence of mitral stenosis.   4. The aortic valve is tricuspid. Aortic valve regurgitation is not  visualized. Mild aortic valve sclerosis is present, with no evidence of  aortic valve stenosis.   5. The inferior vena cava is normal in size with greater than 50%  respiratory  variability, suggesting right atrial pressure of 3 mmHg.   Risk Assessment/Calculations:              Physical Exam:   VS:  BP 118/66 (BP Location: Left Arm, Patient Position: Sitting, Cuff Size: Normal)   Pulse (!) 56   Ht 5' 4 (1.626 m)   Wt 119 lb (54 kg)   LMP 01/13/1974   BMI 20.43 kg/m    Wt Readings from Last 3 Encounters:  11/21/24 119 lb (54 kg)  08/09/24 116 lb (52.6 kg)  01/31/24 132 lb 9.6 oz (60.1 kg)    GEN: Well nourished, well developed in no acute distress NECK: No JVD; No carotid bruits CARDIAC: RRR, no murmurs, rubs, gallops RESPIRATORY:  Clear to auscultation without rales, wheezing or rhonchi  ABDOMEN: Soft, non-tender, non-distended EXTREMITIES:  No edema; No acute deformity      Assessment and Plan:  HFimpEF Nonischemic cardiomyopathy Echo 08/2021 with LVEF 25 to 30% LHC 09/2021 showed nonobstructive CAD Cardiac MRI 12/2021 showed normal LV and RV size and function with LVEF 55% Echocardiogram 01/2022 with LVEF 55 to 60% - Most recent cardiac MRI shows improvement in LVEF and with normal left ventricular function - Today she  appears euvolemic and well compensated on exam without dyspnea orthopnea, PND - Has been inconsistently taking Entresto  97-103 mg and was taken off of Jardiance  last year by PCP due to weight loss - Blood pressures have been running low as of recent measuring in the 90s over 60s  - Will decrease Entresto  to 24-26 mg twice daily - Will decrease spironolactone  to 12.5 mg daily - Continue metoprolol  succinate 25 mg daily - Continue furosemide  40 mg daily  Coronary artery disease LHC 09/2021 showed nonobstructive mild CAD - Today patient is stable without chest pains or dyspnea.  She denies exertional symptoms.  She is without symptoms suggestive of active angina.  No indication for further ischemic evaluation at this time - Continue atorvastatin  40 mg daily  Peripheral arterial disease History of left femoropopliteal bypass.   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  Signed, Lum LITTIE Louis, NP  "

## 2024-11-22 ENCOUNTER — Ambulatory Visit: Payer: Self-pay | Admitting: Emergency Medicine

## 2024-11-22 LAB — BASIC METABOLIC PANEL WITH GFR
BUN/Creatinine Ratio: 10 — ABNORMAL LOW (ref 12–28)
BUN: 12 mg/dL (ref 8–27)
CO2: 28 mmol/L (ref 20–29)
Calcium: 10 mg/dL (ref 8.7–10.3)
Chloride: 100 mmol/L (ref 96–106)
Creatinine, Ser: 1.15 mg/dL — ABNORMAL HIGH (ref 0.57–1.00)
Glucose: 115 mg/dL — ABNORMAL HIGH (ref 70–99)
Potassium: 3.6 mmol/L (ref 3.5–5.2)
Sodium: 144 mmol/L (ref 134–144)
eGFR: 49 mL/min/1.73 — ABNORMAL LOW

## 2024-11-22 LAB — CBC
Hematocrit: 39.6 % (ref 34.0–46.6)
Hemoglobin: 12.3 g/dL (ref 11.1–15.9)
MCH: 28.3 pg (ref 26.6–33.0)
MCHC: 31.1 g/dL — ABNORMAL LOW (ref 31.5–35.7)
MCV: 91 fL (ref 79–97)
Platelets: 213 x10E3/uL (ref 150–450)
RBC: 4.35 x10E6/uL (ref 3.77–5.28)
RDW: 12.7 % (ref 11.7–15.4)
WBC: 4.3 x10E3/uL (ref 3.4–10.8)

## 2024-11-22 LAB — LIPID PANEL
Chol/HDL Ratio: 2 ratio (ref 0.0–4.4)
Cholesterol, Total: 126 mg/dL (ref 100–199)
HDL: 64 mg/dL
LDL Chol Calc (NIH): 48 mg/dL (ref 0–99)
Triglycerides: 68 mg/dL (ref 0–149)
VLDL Cholesterol Cal: 14 mg/dL (ref 5–40)

## 2024-12-14 ENCOUNTER — Other Ambulatory Visit (HOSPITAL_BASED_OUTPATIENT_CLINIC_OR_DEPARTMENT_OTHER): Payer: Self-pay | Admitting: Pulmonary Disease
